# Patient Record
Sex: Female | Born: 1950
Health system: Southern US, Community
[De-identification: ages and names within clinical notes are randomized; demographics above are authoritative.]

## PROBLEM LIST (undated history)

## (undated) DIAGNOSIS — M8580 Other specified disorders of bone density and structure, unspecified site: Secondary | ICD-10-CM

## (undated) DIAGNOSIS — T7840XA Allergy, unspecified, initial encounter: Secondary | ICD-10-CM

## (undated) DIAGNOSIS — E785 Hyperlipidemia, unspecified: Secondary | ICD-10-CM

## (undated) DIAGNOSIS — J45909 Unspecified asthma, uncomplicated: Secondary | ICD-10-CM

## (undated) DIAGNOSIS — Z853 Personal history of malignant neoplasm of breast: Secondary | ICD-10-CM

## (undated) DIAGNOSIS — C801 Malignant (primary) neoplasm, unspecified: Secondary | ICD-10-CM

## (undated) DIAGNOSIS — I10 Essential (primary) hypertension: Secondary | ICD-10-CM

## (undated) DIAGNOSIS — K219 Gastro-esophageal reflux disease without esophagitis: Secondary | ICD-10-CM

## (undated) DIAGNOSIS — I2510 Atherosclerotic heart disease of native coronary artery without angina pectoris: Secondary | ICD-10-CM

## (undated) HISTORY — PX: REDUCTION MAMMAPLASTY: SUR839

## (undated) HISTORY — DX: Hyperlipidemia, unspecified: E78.5

## (undated) HISTORY — DX: Malignant (primary) neoplasm, unspecified: C80.1

## (undated) HISTORY — DX: Other specified disorders of bone density and structure, unspecified site: M85.80

## (undated) HISTORY — PX: BREAST IMPLANT EXCHANGE: SHX6296

## (undated) HISTORY — PX: COLONOSCOPY: SHX174

## (undated) HISTORY — DX: Allergy, unspecified, initial encounter: T78.40XA

## (undated) HISTORY — PX: MASTECTOMY: SHX3

## (undated) HISTORY — DX: Essential (primary) hypertension: I10

## (undated) HISTORY — DX: Atherosclerotic heart disease of native coronary artery without angina pectoris: I25.10

## (undated) HISTORY — DX: Gastro-esophageal reflux disease without esophagitis: K21.9

## (undated) HISTORY — PX: AUGMENTATION MAMMAPLASTY: SUR837

## (undated) HISTORY — DX: Personal history of malignant neoplasm of breast: Z85.3

## (undated) HISTORY — DX: Unspecified asthma, uncomplicated: J45.909

## (undated) HISTORY — PX: COSMETIC SURGERY: SHX468

---
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
## 2010-12-11 DIAGNOSIS — Z803 Family history of malignant neoplasm of breast: Secondary | ICD-10-CM | POA: Insufficient documentation

## 2010-12-11 DIAGNOSIS — N63 Unspecified lump in unspecified breast: Secondary | ICD-10-CM | POA: Insufficient documentation

## 2010-12-15 DIAGNOSIS — C50919 Malignant neoplasm of unspecified site of unspecified female breast: Secondary | ICD-10-CM | POA: Insufficient documentation

## 2012-05-03 LAB — HM COLONOSCOPY

## 2013-06-22 HISTORY — PX: ABDOMINAL HYSTERECTOMY: SHX81

## 2013-10-20 DIAGNOSIS — R9439 Abnormal result of other cardiovascular function study: Secondary | ICD-10-CM | POA: Insufficient documentation

## 2015-01-17 DIAGNOSIS — Z9889 Other specified postprocedural states: Secondary | ICD-10-CM | POA: Insufficient documentation

## 2016-09-24 DIAGNOSIS — R7981 Abnormal blood-gas level: Secondary | ICD-10-CM | POA: Insufficient documentation

## 2016-09-24 DIAGNOSIS — R3989 Other symptoms and signs involving the genitourinary system: Secondary | ICD-10-CM | POA: Insufficient documentation

## 2016-09-24 DIAGNOSIS — M25559 Pain in unspecified hip: Secondary | ICD-10-CM | POA: Insufficient documentation

## 2016-09-24 DIAGNOSIS — M898X9 Other specified disorders of bone, unspecified site: Secondary | ICD-10-CM | POA: Insufficient documentation

## 2016-09-24 DIAGNOSIS — G8929 Other chronic pain: Secondary | ICD-10-CM | POA: Insufficient documentation

## 2016-09-25 DIAGNOSIS — J3089 Other allergic rhinitis: Secondary | ICD-10-CM | POA: Insufficient documentation

## 2016-12-28 DIAGNOSIS — M659 Synovitis and tenosynovitis, unspecified: Secondary | ICD-10-CM | POA: Insufficient documentation

## 2016-12-28 DIAGNOSIS — M65962 Unspecified synovitis and tenosynovitis, left lower leg: Secondary | ICD-10-CM | POA: Insufficient documentation

## 2016-12-28 DIAGNOSIS — M2242 Chondromalacia patellae, left knee: Secondary | ICD-10-CM | POA: Insufficient documentation

## 2017-02-17 DIAGNOSIS — R748 Abnormal levels of other serum enzymes: Secondary | ICD-10-CM | POA: Insufficient documentation

## 2017-02-17 DIAGNOSIS — Z23 Encounter for immunization: Secondary | ICD-10-CM | POA: Insufficient documentation

## 2017-02-18 DIAGNOSIS — G629 Polyneuropathy, unspecified: Secondary | ICD-10-CM | POA: Insufficient documentation

## 2017-02-18 DIAGNOSIS — A6004 Herpesviral vulvovaginitis: Secondary | ICD-10-CM | POA: Insufficient documentation

## 2017-02-23 DIAGNOSIS — Z800 Family history of malignant neoplasm of digestive organs: Secondary | ICD-10-CM | POA: Insufficient documentation

## 2017-03-03 DIAGNOSIS — M79662 Pain in left lower leg: Secondary | ICD-10-CM | POA: Insufficient documentation

## 2017-03-25 ENCOUNTER — Encounter: Payer: Self-pay | Admitting: Neurology

## 2017-06-28 ENCOUNTER — Ambulatory Visit: Payer: Self-pay | Admitting: Neurology

## 2018-01-10 ENCOUNTER — Ambulatory Visit (INDEPENDENT_AMBULATORY_CARE_PROVIDER_SITE_OTHER): Payer: Managed Care, Other (non HMO) | Admitting: Family Medicine

## 2018-01-10 ENCOUNTER — Encounter: Payer: Self-pay | Admitting: Family Medicine

## 2018-01-10 VITALS — BP 110/64 | HR 87 | Temp 98.5°F | Ht 67.0 in | Wt 231.4 lb

## 2018-01-10 DIAGNOSIS — Z1231 Encounter for screening mammogram for malignant neoplasm of breast: Secondary | ICD-10-CM | POA: Diagnosis not present

## 2018-01-10 DIAGNOSIS — Z1239 Encounter for other screening for malignant neoplasm of breast: Secondary | ICD-10-CM

## 2018-01-10 DIAGNOSIS — Z1283 Encounter for screening for malignant neoplasm of skin: Secondary | ICD-10-CM

## 2018-01-10 DIAGNOSIS — K219 Gastro-esophageal reflux disease without esophagitis: Secondary | ICD-10-CM

## 2018-01-10 DIAGNOSIS — I10 Essential (primary) hypertension: Secondary | ICD-10-CM | POA: Diagnosis not present

## 2018-01-10 DIAGNOSIS — J4520 Mild intermittent asthma, uncomplicated: Secondary | ICD-10-CM | POA: Insufficient documentation

## 2018-01-10 MED ORDER — MONTELUKAST SODIUM 10 MG PO TABS: 10.0000 mg | ORAL_TABLET | Freq: Every day | ORAL | 3 refills | Status: DC

## 2018-01-10 MED ORDER — ESOMEPRAZOLE MAGNESIUM 20 MG PO PACK: 20.0000 mg | PACK | Freq: Every day | ORAL | 1 refills | Status: DC

## 2018-01-10 MED ORDER — METOPROLOL SUCCINATE ER 25 MG PO TB24: 25.0000 mg | ORAL_TABLET | Freq: Every day | ORAL | 3 refills | Status: DC

## 2018-01-10 MED ORDER — LOSARTAN POTASSIUM 50 MG PO TABS: 50.0000 mg | ORAL_TABLET | Freq: Every day | ORAL | 3 refills | Status: DC

## 2018-01-10 NOTE — Progress Notes (Signed)
Pre visit review using our clinic review tool, if applicable. No additional management support is needed unless otherwise documented below in the visit note. 

## 2018-01-10 NOTE — Progress Notes (Signed)
Chief Complaint  Patient presents with  . New Patient (Initial Visit)       New Patient Visit SUBJECTIVE: HPI: Tonya [name] is an 67 y.o.female who is being seen for establishing care.  The patient was previously seen at [facility].    She has a history of reflux.  She has tried to come down from proton pump inhibitors, however her symptoms came back with a vengeance on H2 blockers.  She is currently taking Nexium 20 mg every other day.  This seems to be controlling her symptoms.  She has identified which foods appear to cause her flares.  Increased weight also appears to make her symptoms worse.  She has a history of high blood pressure.  She is compliant with her medication.  She tries to stay active.  Diet is overall healthy.  She has a history of asthma.  She rarely uses her albuterol inhaler.  Singulair helps with both allergy and breathing issues.  She is tolerating this well.  No Known Allergies  Past Medical History:  Diagnosis Date  . Hypertension    Past Surgical History:  Procedure Laterality Date  . ABDOMINAL HYSTERECTOMY  2015  . MASTECTOMY Left   . TONSILLECTOMY AND ADENOIDECTOMY  1958   Family History  Problem Relation Age of Onset  . Heart attack Father        died from   No Known Allergies  Current Outpatient Medications:  .  losartan (COZAAR) 50 MG tablet, Take 1 tablet (50 mg total) by mouth daily., Disp: 90 tablet, Rfl: 3 .  metoprolol succinate (TOPROL-XL) 25 MG 24 hr tablet, Take 1 tablet (25 mg total) by mouth daily., Disp: 90 tablet, Rfl: 3 .  montelukast (SINGULAIR) 10 MG tablet, Take 1 tablet (10 mg total) by mouth at bedtime., Disp: 90 tablet, Rfl: 3 .  esomeprazole (NEXIUM) 20 MG packet, Take 20 mg by mouth daily before breakfast., Disp: 90 each, Rfl: 1  ROS Cardiovascular: Denies chest pain  Respiratory: Denies dyspnea   OBJECTIVE: BP 110/64 (BP Location: Left Arm, Patient Position: Sitting, Cuff Size: Large)   Pulse 87   Temp 98.5 F  (36.9 C) (Oral)   Ht 5\' 7"  (1.702 m)   Wt 231 lb 6 oz (105 kg)   SpO2 97%   BMI 36.24 kg/m   Constitutional: -  VS reviewed -  Well developed, well nourished, appears stated age -  No apparent distress  Psychiatric: -  Oriented to person, place, and time -  Memory intact -  Affect and mood normal -  Fluent conversation, good eye contact -  Judgment and insight age appropriate  Eye: -  Conjunctivae clear, no discharge -  Pupils symmetric, round, reactive to light  ENMT: -  MMM    Pharynx moist, no exudate, no erythema  Neck: -  No gross swelling, no palpable masses -  Thyroid midline, not enlarged, mobile, no palpable masses  Cardiovascular: -  RRR -  No LE edema  Respiratory: -  Normal respiratory effort, no accessory muscle use, no retraction -  Breath sounds equal, no wheezes, no ronchi, no crackles  Gastrointestinal: -  Bowel sounds normal -  No tenderness, no distention, no guarding, no masses  Musculoskeletal: -  No clubbing, no cyanosis -  Gait normal  Skin: -Circular patch of mild hyperpigmentation on the medial distal left lower extremity.  It is tender to palpation.  There is an area slightly lower with superficial telangiectasias noted.  It is  also tender to palpation. -  Warm and dry to palpation   ASSESSMENT/PLAN: Gastroesophageal reflux disease, esophagitis presence not specified - Plan: esomeprazole ([place]) 20 MG packet  Screening for breast cancer - Plan: MM DIGITAL SCREENING BILATERAL  Essential hypertension - Plan: losartan (COZAAR) 50 MG tablet, metoprolol succinate (TOPROL-XL) 25 MG 24 hr tablet  Skin exam, screening for cancer  Mild intermittent asthma without complication - Plan: montelukast (SINGULAIR) 10 MG tablet, Ambulatory referral to Dermatology  Patient instructed to sign release of records form from her previous PCP. I am okay with her taking Nexium every other day. Continue blood pressure regimen. Continue Singulair. Patient should return  for a physical in 3 months. The patient voiced understanding and agreement to the plan.   [place], DO 01/10/18  12:37 PM

## 2018-01-10 NOTE — Patient Instructions (Addendum)
If you do not hear anything about your referral in the next 1-2 weeks, call our office and ask for an update.  Tylenol is the safest medicine to help with pains. Yoga may be helpful moving forward. Consider YouTube.   Nexium and Protonix are very similar. I think it is a reasonable substitution if Nexium is too expensive.  Let us know if you need anything.

## 2018-02-11 ENCOUNTER — Other Ambulatory Visit: Payer: Self-pay | Admitting: Family Medicine

## 2018-02-11 ENCOUNTER — Encounter (HOSPITAL_BASED_OUTPATIENT_CLINIC_OR_DEPARTMENT_OTHER): Payer: Self-pay | Admitting: Radiology

## 2018-02-11 ENCOUNTER — Ambulatory Visit (HOSPITAL_BASED_OUTPATIENT_CLINIC_OR_DEPARTMENT_OTHER)
Admission: RE | Admit: 2018-02-11 | Discharge: 2018-02-11 | Disposition: A | Discharge status: Home / Self Care | Payer: Managed Care, Other (non HMO) | Source: Ambulatory Visit | Attending: Family Medicine | Admitting: Family Medicine

## 2018-02-11 DIAGNOSIS — Z1231 Encounter for screening mammogram for malignant neoplasm of breast: Secondary | ICD-10-CM | POA: Insufficient documentation

## 2018-02-11 DIAGNOSIS — Z1239 Encounter for other screening for malignant neoplasm of breast: Secondary | ICD-10-CM

## 2018-02-11 IMAGING — MG DIGITAL SCREENING UNILATERAL RIGHT MAMMOGRAM WITH CAD AND TOMO
4 series · 4 of 12 positions shown · non-contrast
Comparison: Previous exam(s).

CLINICAL DATA: Screening.

EXAM:
DIGITAL SCREENING UNILATERAL RIGHT MAMMOGRAM WITH CAD AND TOMO

[R MLO synth-2D]
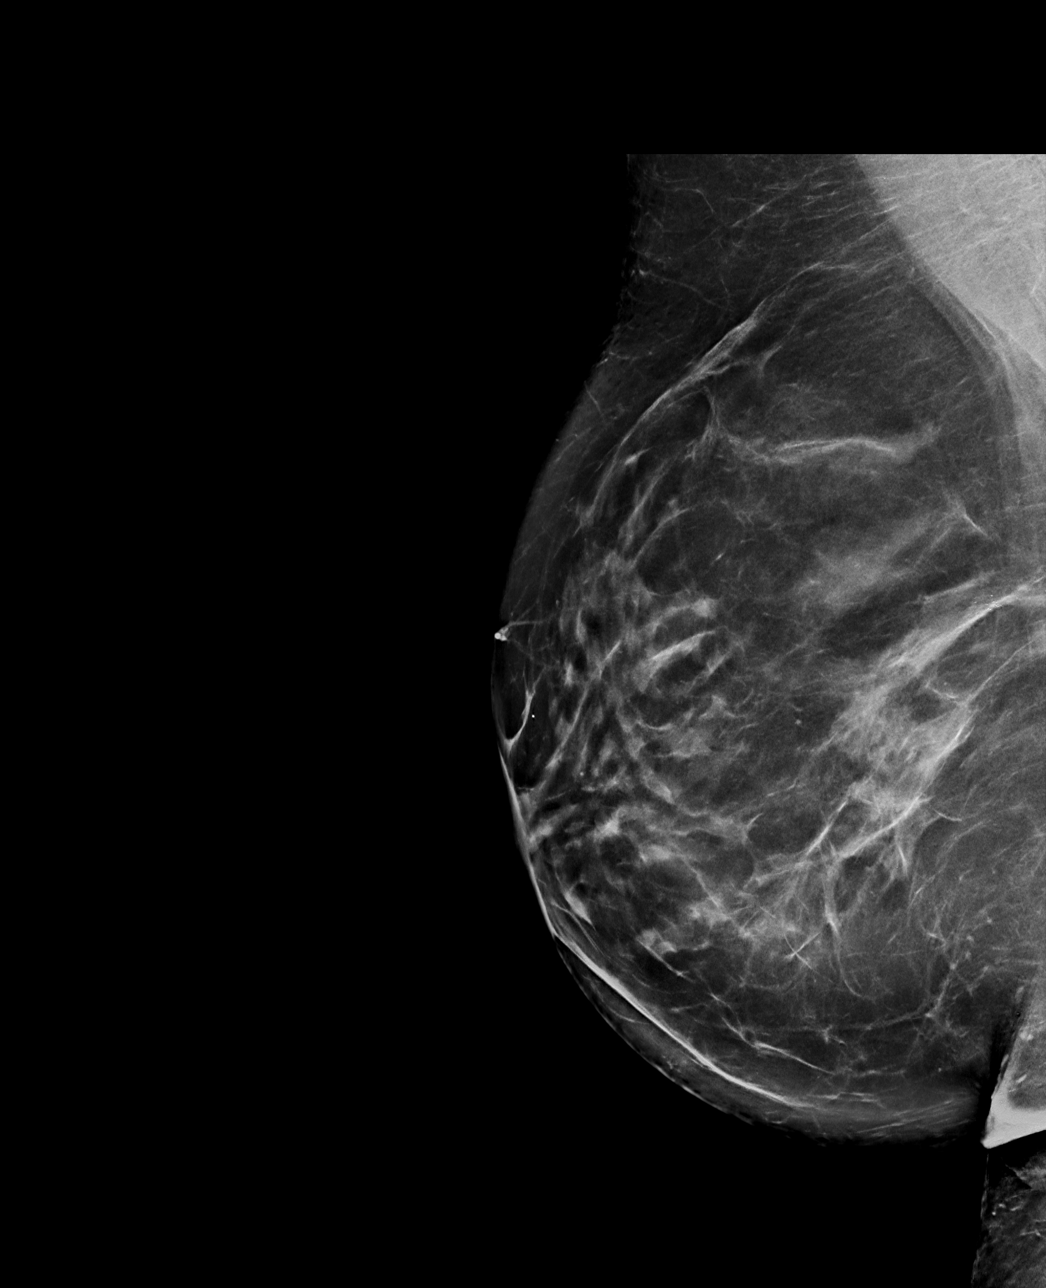

[R CC synth-2D]
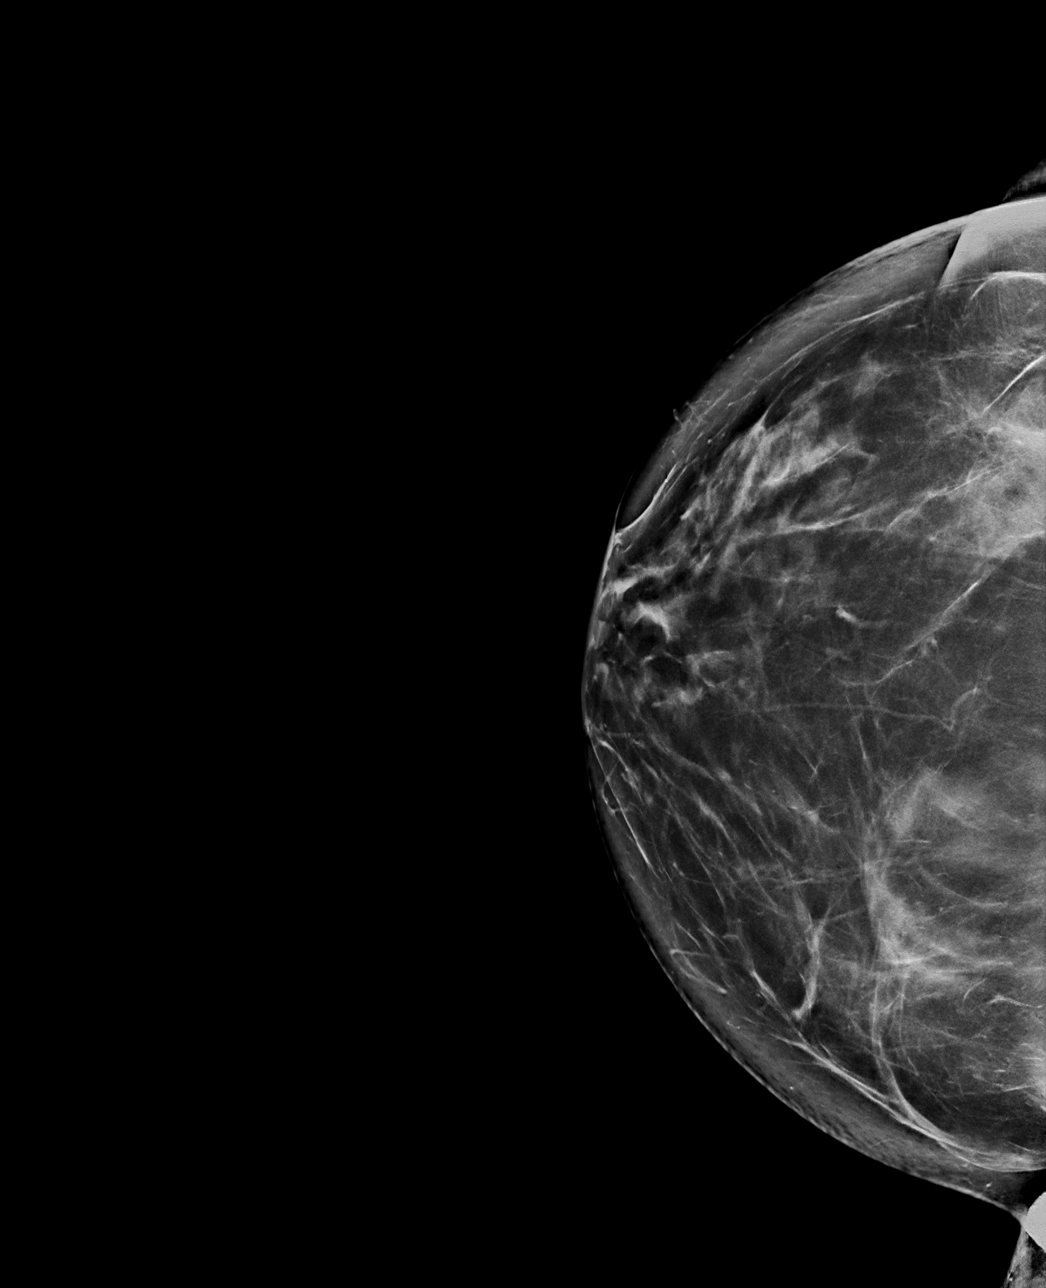

[R MLO tomo · tomo slice 51/101.0]
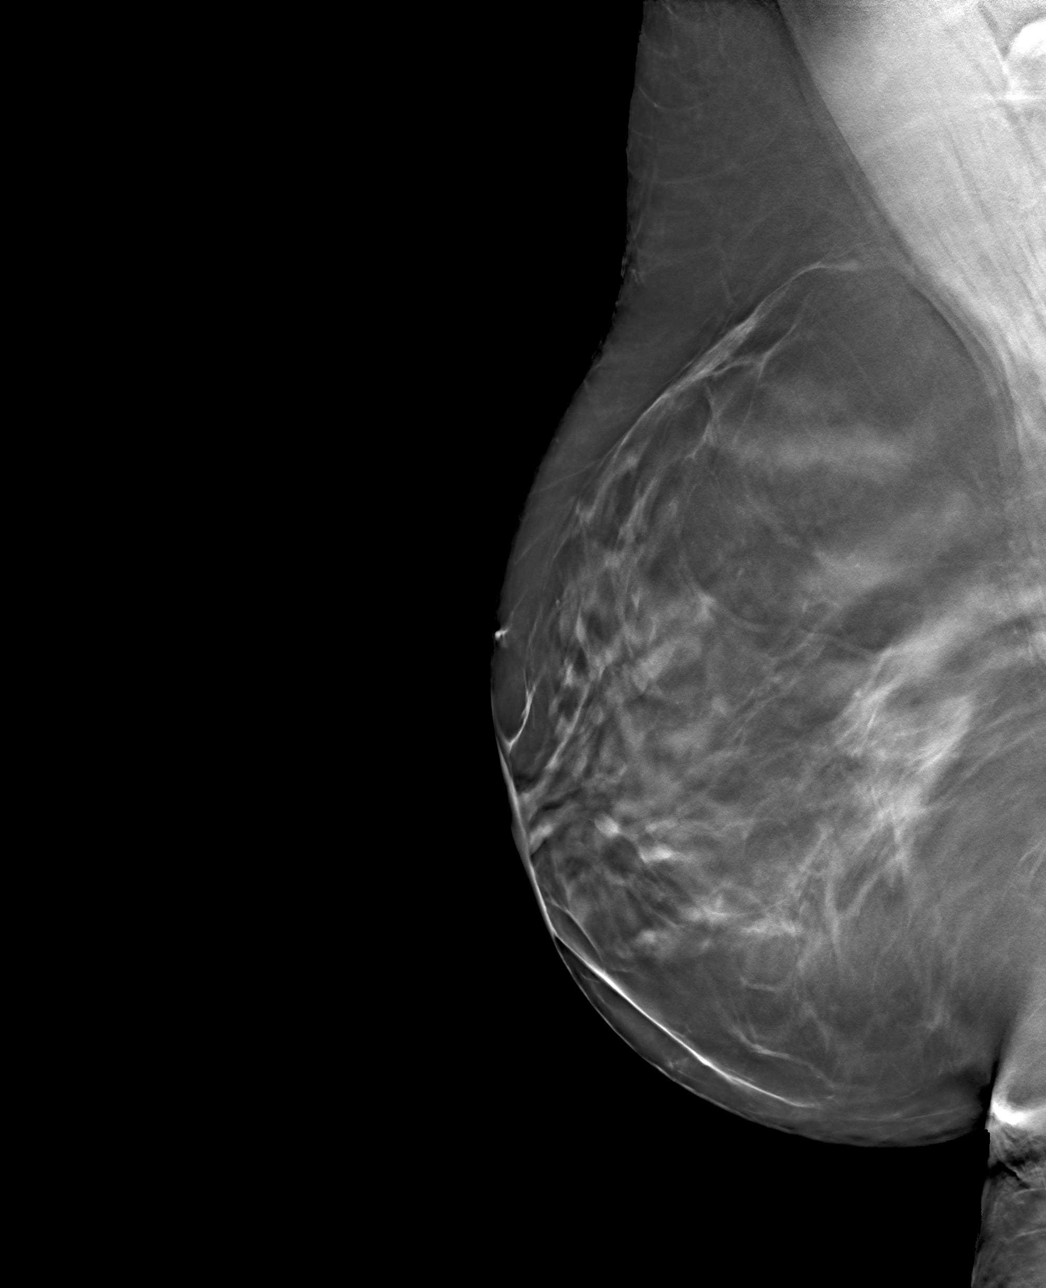

[R CC tomo · tomo slice 49/98.0]
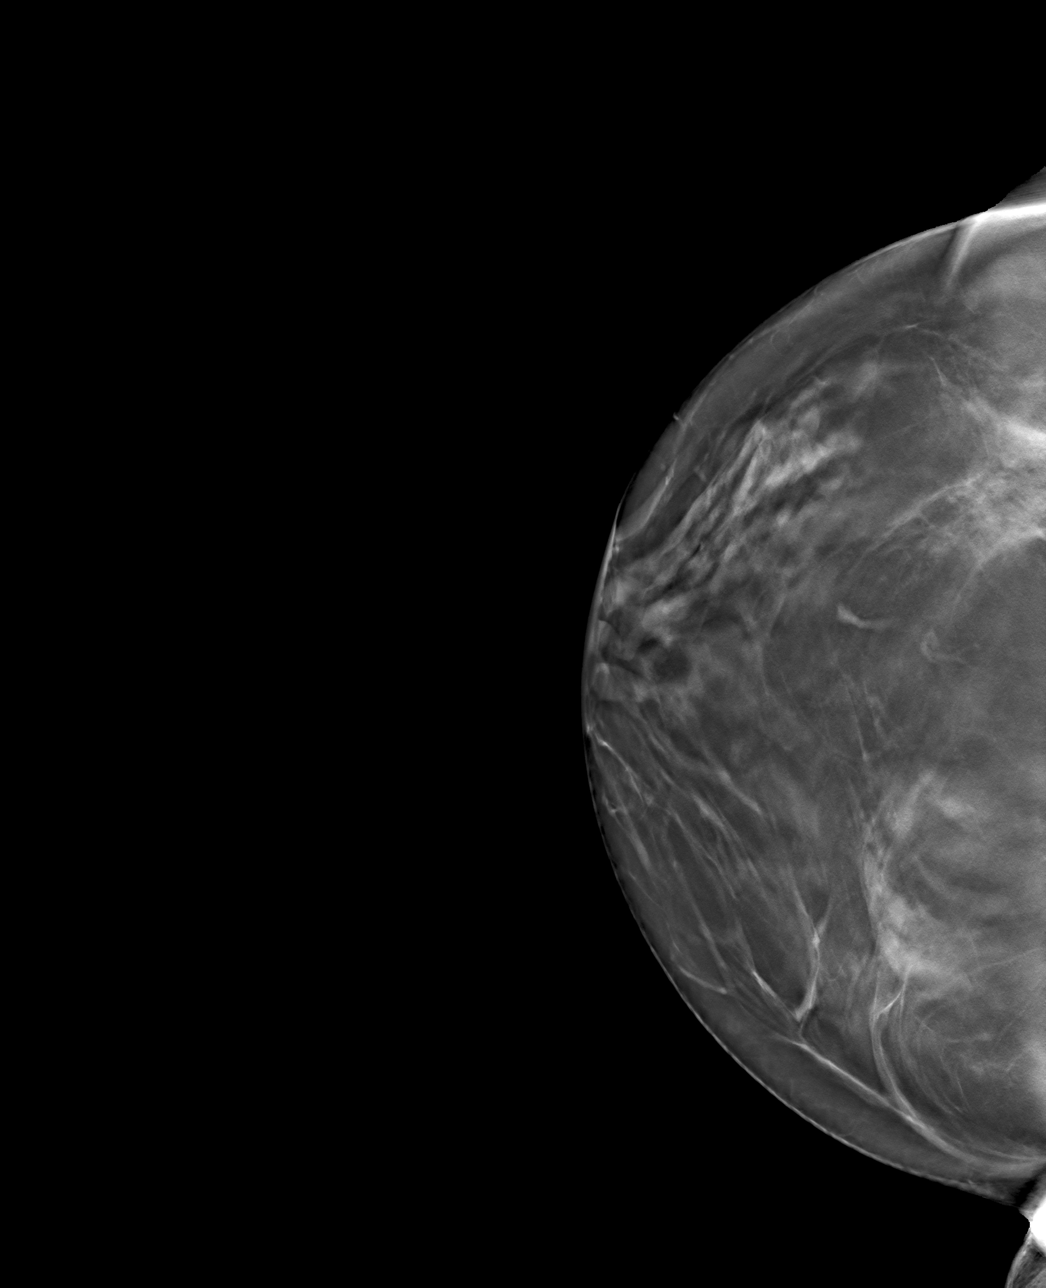

[4 of 12 positions shown; findings below may reference images not displayed]

ACR Breast Density Category b: There are scattered areas of
fibroglandular density.
FINDINGS: There are no findings suspicious for malignancy. Images were
processed with CAD.
IMPRESSION: No mammographic evidence of malignancy. A result letter of this
screening mammogram will be mailed directly to the patient.

RECOMMENDATION:
Screening mammogram in one year. (Code:[YD])

BI-RADS CATEGORY  1: Negative.

## 2018-03-22 ENCOUNTER — Telehealth: Payer: Self-pay | Admitting: *Deleted

## 2018-03-22 NOTE — Telephone Encounter (Signed)
Received Medical records from [facility]; forwarded to provider/SLS 10/01

## 2018-04-06 ENCOUNTER — Telehealth: Payer: Self-pay | Admitting: Family Medicine

## 2018-04-06 DIAGNOSIS — K219 Gastro-esophageal reflux disease without esophagitis: Secondary | ICD-10-CM

## 2018-04-06 MED ORDER — PANTOPRAZOLE SODIUM 20 MG PO TBEC: 20.0000 mg | DELAYED_RELEASE_TABLET | Freq: Every day | ORAL | 2 refills | Status: DC

## 2018-04-06 NOTE — Telephone Encounter (Signed)
Copied from [place] [id number]. Topic: Quick Communication - See Telephone Encounter >> Apr 06, 2018  1:03 PM Hewitt Shorts wrote: Pt is calling to see if Dr. Nani Ravens will refill her pantotrazole   20 mg 90 day supply-it has not been filled by him before but they have talked it over that it works just the same as nexium an is a lot less expensive   [facility] mail order pharmacy -not on file either   Best number [phone]

## 2018-04-06 NOTE — Addendum Note (Signed)
Addended by: Raynelle Dick R on: 04/06/2018 02:50 PM   Modules accepted: Orders

## 2018-04-06 NOTE — Telephone Encounter (Signed)
Done. TY.

## 2018-04-06 NOTE — Telephone Encounter (Signed)
This medication was sent to the wrong pharmacy. Can this please be sent to [facility], [address] [place] [address]  Phone: [phone] Fax: [phone]

## 2018-04-06 NOTE — Telephone Encounter (Signed)
done

## 2018-04-06 NOTE — Telephone Encounter (Signed)
Pt called requesting to take pantoprazole in place of Nexium. She is asking for a refill. This med is not listed on her med list. So she is asking for a prescription for a 20 mg tab and 90 day supply. She states that it is less expensive than the Nexium..  [place]

## 2018-04-14 ENCOUNTER — Ambulatory Visit (INDEPENDENT_AMBULATORY_CARE_PROVIDER_SITE_OTHER): Payer: Managed Care, Other (non HMO) | Admitting: Family Medicine

## 2018-04-14 ENCOUNTER — Encounter: Payer: Self-pay | Admitting: Family Medicine

## 2018-04-14 VITALS — BP 120/84 | HR 89 | Temp 98.1°F | Ht 67.0 in | Wt 228.1 lb

## 2018-04-14 DIAGNOSIS — E2839 Other primary ovarian failure: Secondary | ICD-10-CM

## 2018-04-14 DIAGNOSIS — Z0000 Encounter for general adult medical examination without abnormal findings: Secondary | ICD-10-CM

## 2018-04-14 DIAGNOSIS — Z23 Encounter for immunization: Secondary | ICD-10-CM | POA: Diagnosis not present

## 2018-04-14 LAB — COMPREHENSIVE METABOLIC PANEL
ALK PHOS: 80 U/L (ref 39–117)
ALT: 23 U/L (ref 0–35)
AST: 23 U/L (ref 0–37)
Albumin: 4.4 g/dL (ref 3.5–5.2)
BILIRUBIN TOTAL: 0.8 mg/dL (ref 0.2–1.2)
BUN: 15 mg/dL (ref 6–23)
CALCIUM: 9.5 mg/dL (ref 8.4–10.5)
CO2: 27 mEq/L (ref 19–32)
CREATININE: 0.84 mg/dL (ref 0.40–1.20)
Chloride: 104 mEq/L (ref 96–112)
GFR: 72.04 mL/min (ref >60.00)
Glucose, Bld: 93 mg/dL (ref 70–99)
Potassium: 4.3 mEq/L (ref 3.5–5.1)
Sodium: 141 mEq/L (ref 135–145)
TOTAL PROTEIN: 7.3 g/dL (ref 6.0–8.3)

## 2018-04-14 LAB — LIPID PANEL
Cholesterol: 220 mg/dL — ABNORMAL HIGH (ref 0–200)
HDL: 60.4 mg/dL (ref >39.00)
LDL Cholesterol: 139 mg/dL — ABNORMAL HIGH (ref 0–99)
NonHDL: 159.91
TRIGLYCERIDES: 105 mg/dL (ref 0.0–149.0)
Total CHOL/HDL Ratio: 4
VLDL: 21 mg/dL (ref 0.0–40.0)

## 2018-04-14 NOTE — Patient Instructions (Addendum)
The new Shingrix vaccine (for shingles) is a 2 shot series. It can make people feel low energy, achy and almost like they have the flu for 48 hours after injection. Please plan accordingly when deciding on when to get this shot. Call our office for a nurse visit appointment to get this. The second shot of the series is less severe regarding the side effects, but it still lasts 48 hours.   Keep the diet clean.  Aim to do some physical exertion for 150 minutes per week. This is typically divided into 5 days per week, 30 minutes per day. The activity should be enough to get your heart rate up. Anything is better than nothing if you have time constraints.  Give [place] 2-3 business days to get the results of your labs back.   Sleep is important to [place] all. Getting good sleep is imperative to adequate functioning during the day. Work with our counselors who are trained to help people obtain quality sleep. Call [phone] to schedule an appointment or if you are curious about insurance coverage/cost.  Let us know if you need anything.

## 2018-04-14 NOTE — Progress Notes (Signed)
Pre visit review using our clinic review tool, if applicable. No additional management support is needed unless otherwise documented below in the visit note. 

## 2018-04-14 NOTE — Progress Notes (Signed)
Chief Complaint  Patient presents with  . Annual Exam     Well Woman Tonya [name] is here for a complete physical.   Her last physical was >1 year ago.  Current diet: in general, a "not the greatest" diet. Current exercise: None. Weight is stable and shedenies daytime fatigue. No LMP recorded. Patient is postmenopausal.  Seatbelt? Yes  Health Maintenance Colonoscopy- Yes- when she was 17 Shingrix- No DEXA- No Mammogram- Yes  Tetanus- Yes 10/20/2008 Pneumonia- No Hep C screen- Yes- 2018  Past Medical History:  Diagnosis Date  . Hypertension      Past Surgical History:  Procedure Laterality Date  . ABDOMINAL HYSTERECTOMY  2015  . MASTECTOMY Left   . TONSILLECTOMY AND ADENOIDECTOMY  1958    Medications  Current Outpatient Medications on File Prior to Visit  Medication Sig Dispense Refill  . losartan (COZAAR) 50 MG tablet Take 1 tablet (50 mg total) by mouth daily. 90 tablet 3  . metoprolol succinate (TOPROL-XL) 25 MG 24 hr tablet Take 1 tablet (25 mg total) by mouth daily. 90 tablet 3  . montelukast (SINGULAIR) 10 MG tablet Take 1 tablet (10 mg total) by mouth at bedtime. 90 tablet 3  . pantoprazole (PROTONIX) 20 MG tablet Take 1 tablet (20 mg total) by mouth daily. 90 tablet 2   Allergies No Known Allergies  Review of Systems: Constitutional:  no sweats Eye:  no recent significant change in vision Ear/Nose/Mouth/Throat:  Ears:  No changes in hearing Nose/Mouth/Throat:  no complaints of nasal congestion, no sore throat Cardiovascular: no chest pain Respiratory:  No cough and no shortness of breath Gastrointestinal:  no abdominal pain, no change in bowel habits GU:  Female: negative for dysuria or pelvic pain Musculoskeletal/Extremities:  no pain of the joints Integumentary (Skin/Breast):  no abnormal skin lesions reported Neurologic:  no headaches Psychiatric:  no anxiety, no depression Endocrine:  denies unexplained weight changes Hematologic/Lymphatic:  no  abnormal bleeding  Exam BP 120/84 (BP Location: Left Arm, Patient Position: Sitting, Cuff Size: Normal)   Pulse 89   Temp 98.1 F (36.7 C) (Oral)   Ht 5\' 7"  (1.702 m)   Wt 228 lb 2 oz (103.5 kg)   SpO2 95%   BMI 35.73 kg/m  General:  well developed, well nourished, in no apparent distress Skin:  no significant moles, warts, or growths Head:  no masses, lesions, or tenderness Eyes:  pupils equal and round, sclera anicteric without injection Ears:  canals without lesions, TMs shiny without retraction, no obvious effusion, no erythema Nose:  nares patent, septum midline, mucosa normal, and no drainage or sinus tenderness Throat/Pharynx:  lips and gingiva without lesion; tongue and uvula midline; non-inflamed pharynx; no exudates or postnasal drainage Neck: neck supple without adenopathy, thyromegaly, or masses Lungs:  clear to auscultation, breath sounds equal bilaterally, no respiratory distress Cardio:  regular rate and rhythm, no bruits or LE edema Abdomen:  abdomen soft, nontender; bowel sounds normal; no masses or organomegaly Genital: Deferred Musculoskeletal:  symmetrical muscle groups noted without atrophy or deformity Extremities:  no clubbing, cyanosis, or edema, no deformities, no skin discoloration Neuro:  gait normal; deep tendon reflexes normal and symmetric Psych: well oriented with normal range of affect and appropriate judgment/insight  Assessment and Plan  Well adult exam - Plan: Comprehensive metabolic panel, Lipid panel  Estrogen deficiency - Plan: DG Bone Density  Influenza vaccine administered - Plan: Flu Vaccine QUAD 6+ mos PF IM (Fluarix Quad PF)  Need for vaccination against  Streptococcus pneumoniae - Plan: Pneumococcal conjugate vaccine 13-valent   Well 67 y.o. female. Counseled on diet and exercise. Shingrix info given. Other orders as above. Follow up in 1 year pending the above workup. PCV23 due that date. The patient voiced understanding and  agreement to the plan.  [place], DO 04/14/18 9:37 AM

## 2018-04-22 ENCOUNTER — Encounter: Payer: Self-pay | Admitting: Family Medicine

## 2018-04-22 ENCOUNTER — Ambulatory Visit (HOSPITAL_BASED_OUTPATIENT_CLINIC_OR_DEPARTMENT_OTHER)
Admission: RE | Admit: 2018-04-22 | Discharge: 2018-04-22 | Disposition: A | Discharge status: Home / Self Care | Payer: Managed Care, Other (non HMO) | Source: Ambulatory Visit | Attending: Family Medicine | Admitting: Family Medicine

## 2018-04-22 DIAGNOSIS — E2839 Other primary ovarian failure: Secondary | ICD-10-CM | POA: Diagnosis not present

## 2018-04-22 DIAGNOSIS — M85852 Other specified disorders of bone density and structure, left thigh: Secondary | ICD-10-CM | POA: Insufficient documentation

## 2018-04-22 DIAGNOSIS — Z13820 Encounter for screening for osteoporosis: Secondary | ICD-10-CM | POA: Diagnosis present

## 2018-04-22 DIAGNOSIS — M8580 Other specified disorders of bone density and structure, unspecified site: Secondary | ICD-10-CM | POA: Insufficient documentation

## 2018-07-05 ENCOUNTER — Ambulatory Visit: Payer: Self-pay | Admitting: *Deleted

## 2018-07-05 NOTE — Telephone Encounter (Signed)
Pt returned call for cough and congestion. She stated that she was exposed to RSV (her grand daughter). She has a stuffy nose, a "barky" cough, sore throat, achy body, headache and low grade fever.  She was exposed to her on Friday. She is able to take Tylenol and alternate with Advil for the pain. She has a hx of asthma and uses an inhaler.  Advised to try hot liquids like tea with honey and lemon, using an humidifier and cont with the medications for her body aches. Appointment scheduled per protocol. She will call if she starts feeling better in the morning. She will go to an urgent care of ED if she has shortness of breath or fever. Routing to [facility] at [facility].  Reason for Disposition . [1] Continuous (nonstop) coughing interferes with work or school AND [2] no improvement using cough treatment per Care Advice  Answer Assessment - Initial Assessment Questions 1. ONSET: "When did the cough begin?"      Friday 2. SEVERITY: "How bad is the cough today?"      productive 3. RESPIRATORY DISTRESS: "Describe your breathing."      Stuffiness, barky cough 4. FEVER: "Do you have a fever?" If so, ask: "What is your temperature, how was it measured, and when did it start?"     Low grade 5. SPUTUM: "Describe the color of your sputum" (clear, white, yellow, green)     green 6. HEMOPTYSIS: "Are you coughing up any blood?" If so ask: "How much?" (flecks, streaks, tablespoons, etc.)     no 7. CARDIAC HISTORY: "Do you have any history of heart disease?" (e.g., heart attack, congestive heart failure)     HTN 8. LUNG HISTORY: "Do you have any history of lung disease?"  (e.g., pulmonary embolus, asthma, emphysema)     asthma 9. PE RISK FACTORS: "Do you have a history of blood clots?" (or: recent major surgery, recent prolonged travel, bedridden)     no 10. OTHER SYMPTOMS: "Do you have any other symptoms?" (e.g., runny nose, wheezing, chest pain)      Stuffy nose, headache, body aches  11.  PREGNANCY: "Is there any chance you are pregnant?" "When was your last menstrual period?"       n/a 12. TRAVEL: "Have you traveled out of the country in the last month?" (e.g., travel history, exposures)  Protocols used: [place]

## 2018-07-05 NOTE — Telephone Encounter (Signed)
Message from Berneta Levins sent at 07/05/2018 4:51 PM EST   Summary: exposure to RSV   Pt was exposed to RSV. Pt would like a nurse to call her and let her know what the symptoms of RSV are in an adult. Pt has been stopped up, pain in upper back, congestion, cough, sore throat, green mucus

## 2018-07-06 ENCOUNTER — Ambulatory Visit (INDEPENDENT_AMBULATORY_CARE_PROVIDER_SITE_OTHER): Payer: Managed Care, Other (non HMO) | Admitting: Family

## 2018-07-06 ENCOUNTER — Encounter: Payer: Self-pay | Admitting: Family

## 2018-07-06 VITALS — BP 120/60 | HR 101 | Temp 98.7°F | Resp 16 | Ht 67.0 in | Wt 232.0 lb

## 2018-07-06 DIAGNOSIS — J029 Acute pharyngitis, unspecified: Secondary | ICD-10-CM

## 2018-07-06 DIAGNOSIS — R0981 Nasal congestion: Secondary | ICD-10-CM

## 2018-07-06 DIAGNOSIS — H6692 Otitis media, unspecified, left ear: Secondary | ICD-10-CM | POA: Diagnosis not present

## 2018-07-06 LAB — POCT RAPID STREP A (OFFICE): Rapid Strep A Screen: NEGATIVE

## 2018-07-06 LAB — POC INFLUENZA A&B (BINAX/QUICKVUE)
Influenza A, POC: NEGATIVE
Influenza B, POC: NEGATIVE

## 2018-07-06 MED ORDER — AMOXICILLIN 500 MG PO CAPS: 500.0000 mg | ORAL_CAPSULE | Freq: Three times a day (TID) | ORAL | 0 refills | Status: DC

## 2018-07-06 NOTE — Progress Notes (Signed)
Subjective:    Patient ID: Tonya [name], female    DOB: May 24, 1951, 68 y.o.   MRN: [id number]  HPI  Patient is a 68 yr old female who presents today with c/o nasal congestion/HA, Cough. Symptoms began on 07/01/18.  Had sore throat last week which has now resolved.   Reports some post nasal drip. Stopped running with the sore throat got really bad.  Started som breo that she had on hand.    Review of Systems See HPI  Past Medical History:  Diagnosis Date  . Hypertension   . Osteopenia      Social History   Socioeconomic History  . Marital status: Married    Spouse name: Not on file  . Number of children: Not on file  . Years of education: Not on file  . Highest education level: Not on file  Occupational History  . Not on file  Social Needs  . Financial resource strain: Not on file  . Food insecurity:    Worry: Not on file    Inability: Not on file  . Transportation needs:    Medical: Not on file    Non-medical: Not on file  Tobacco Use  . Smoking status: Never Smoker  . Smokeless tobacco: Never Used  Substance and Sexual Activity  . Alcohol use: Not on file    Comment: rarely  . Drug use: Never  . Sexual activity: Not on file  Lifestyle  . Physical activity:    Days per week: Not on file    Minutes per session: Not on file  . Stress: Not on file  Relationships  . Social connections:    Talks on phone: Not on file    Gets together: Not on file    Attends religious service: Not on file    Active member of club or organization: Not on file    Attends meetings of clubs or organizations: Not on file    Relationship status: Not on file  . Intimate partner violence:    Fear of current or ex partner: Not on file    Emotionally abused: Not on file    Physically abused: Not on file    Forced sexual activity: Not on file  Other Topics Concern  . Not on file  Social History Narrative  . Not on file    Past Surgical History:  Procedure Laterality Date  .  ABDOMINAL HYSTERECTOMY  2015  . MASTECTOMY Left   . TONSILLECTOMY AND ADENOIDECTOMY  1958    Family History  Problem Relation Age of Onset  . Heart attack Father        died from    No Known Allergies  Current Outpatient Medications on File Prior to Visit  Medication Sig Dispense Refill  . losartan (COZAAR) 50 MG tablet Take 1 tablet (50 mg total) by mouth daily. 90 tablet 3  . metoprolol succinate (TOPROL-XL) 25 MG 24 hr tablet Take 1 tablet (25 mg total) by mouth daily. 90 tablet 3  . montelukast (SINGULAIR) 10 MG tablet Take 1 tablet (10 mg total) by mouth at bedtime. 90 tablet 3  . pantoprazole (PROTONIX) 20 MG tablet Take 1 tablet (20 mg total) by mouth daily. 90 tablet 2   No current facility-administered medications on file prior to visit.     BP 120/60 (BP Location: Right Arm, Patient Position: Sitting, Cuff Size: Large)   Pulse (!) 101   Temp 98.7 F (37.1 C) (Oral)   Resp 16  Ht 5\' 7"  (1.702 m)   Wt 232 lb (105.2 kg)   SpO2 98%   BMI 36.34 kg/m       Objective:   Physical Exam Constitutional:      Appearance: She is well-developed.  HENT:     Right Ear: Ear canal and external ear normal. Tympanic membrane is erythematous.     Left Ear: Ear canal and external ear normal. Tympanic membrane is not erythematous.     Mouth/Throat:     Mouth: Mucous membranes are moist.     Pharynx: No posterior oropharyngeal erythema.  Neck:     Musculoskeletal: Neck supple.     Thyroid: No thyromegaly.  Cardiovascular:     Rate and Rhythm: Normal rate and regular rhythm.     Heart sounds: Normal heart sounds. No murmur.  Pulmonary:     Effort: Pulmonary effort is normal. No respiratory distress.     Breath sounds: Normal breath sounds. No wheezing.  Skin:    General: Skin is warm and dry.  Neurological:     Mental Status: She is alert and oriented to person, place, and time.  Psychiatric:        Behavior: Behavior normal.        Thought Content: Thought content  normal.        Judgment: Judgment normal.           Assessment & Plan:  L Otitis media- rapid flu and rapid strep are negative.   Advised pt as follows: Begin amoxicillin for your left sided ear infection. You may use delsym as needed for cough and mucinex as needed for chest congestion. Call if you develop new/worsening symptoms or if not improved in 3-4 days.

## 2018-07-06 NOTE — Progress Notes (Deleted)
   Subjective:    Patient ID: Tonya [name], female    DOB: 03/12/1951, 68 y.o.   MRN: [id number]  HPI  Reports that she retired 2 weeks ago. Reports mild sore throat on 06/30/18.  07/01/18 ore throat worsened.  Wis miserable Friday night. Grand-daughter had RSV Thursday.  Did not sleep Friday night because she was at the hospital with her granddaughter.  Notes + HA.  Notes +post nasal drip.  + cough.    Review of Systems    see HPI  .basic  Objective:   Physical Exam        Assessment & Plan:

## 2018-07-06 NOTE — Patient Instructions (Signed)
Begin amoxicillin for your left sided ear infection. You may use delsym as needed for cough and mucinex as needed for chest congestion. Call if you develop new/worsening symptoms or if not improved in 3-4 days.

## 2018-07-07 ENCOUNTER — Ambulatory Visit: Payer: Self-pay | Admitting: *Deleted

## 2018-07-07 ENCOUNTER — Telehealth: Payer: Self-pay | Admitting: Family Medicine

## 2018-07-07 DIAGNOSIS — H9319 Tinnitus, unspecified ear: Secondary | ICD-10-CM

## 2018-07-07 NOTE — Telephone Encounter (Signed)
Pt was triaged Tuesday for cough and congestion, left ear pain. She saw a provider yesterday regarding her symptoms. She was prescribed amoxicillin to take.  Pt called back today with right facial swelling, and pain with the right ear. She also states that she hears humming all the time. Drainage coming out of her eye and pain at the eye socket. She is also c/o having a headache. Also she is stating her teeth hurt. Still having cough and stuffiness.  She has taken Advil along with the antibiotic. She does not think she has a fever.  Advised to go to an urgent care. Pt voiced understanding and her husband will be taking her. Routing to [facility] at [facility].  Reason for Disposition . Swelling is painful to touch  Answer Assessment - Initial Assessment Questions 1. ONSET: "When did the swelling start?" (e.g., minutes, hours, days)     today 2. LOCATION: "What part of the face is swollen?"      Right side of face 3. SEVERITY: "How swollen is it?"      4. ITCHING: "Is there any itching?" If so, ask: "How much?"   (Scale 1-10; mild, moderate or severe)     no 5. PAIN: "Is the swelling painful to touch?" If so, ask: "How painful is it?"   (Scale 1-10; mild, moderate or severe)     yes 6. FEVER: "Do you have a fever?" If so, ask: "What is it, how was it measured, and when did it start?"      Not sure 7. CAUSE: "What do you think is causing the face swelling?"     no 8. RECURRENT SYMPTOM: "Have you had face swelling before?" If so, ask: "When was the last time?" "What happened that time?"     9. OTHER SYMPTOMS: "Do you have any other symptoms?" (e.g., toothache, leg swelling)     Teeth aching, pain around eye socket and headache 10. PREGNANCY: "Is there any chance you are pregnant?" "When was your last menstrual period?"       no  Protocols used: [facility]

## 2018-07-07 NOTE — Telephone Encounter (Signed)
Pt called requesting to have a referral to an ENT. She hears humming all the time. No one else can hear it. She would like someone that is in the [facility] network to see her. She was also wandering about getting a hearing test done. Thinks her hearing is decreased. Request a call back regarding this matter.

## 2018-07-08 ENCOUNTER — Encounter: Payer: Self-pay | Admitting: Family Medicine

## 2018-07-08 ENCOUNTER — Ambulatory Visit (INDEPENDENT_AMBULATORY_CARE_PROVIDER_SITE_OTHER): Payer: Managed Care, Other (non HMO) | Admitting: Family Medicine

## 2018-07-08 VITALS — BP 140/86 | HR 103 | Temp 98.8°F | Ht 67.0 in | Wt 232.0 lb

## 2018-07-08 DIAGNOSIS — J0100 Acute maxillary sinusitis, unspecified: Secondary | ICD-10-CM | POA: Diagnosis not present

## 2018-07-08 MED ORDER — METHYLPREDNISOLONE ACETATE 80 MG/ML IJ SUSP
80.0000 mg | Freq: Once | INTRAMUSCULAR | Status: AC
  Administered 2018-07-08: 80 mg via INTRAMUSCULAR

## 2018-07-08 MED ORDER — DOXYCYCLINE HYCLATE 100 MG PO TABS: 100.0000 mg | ORAL_TABLET | Freq: Two times a day (BID) | ORAL | 0 refills | Status: AC

## 2018-07-08 NOTE — Addendum Note (Signed)
Addended by: Sharon Seller B on: 07/08/2018 03:48 PM   Modules accepted: Orders

## 2018-07-08 NOTE — Addendum Note (Signed)
Addended by: Sharon Seller B on: 07/08/2018 07:32 AM   Modules accepted: Orders

## 2018-07-08 NOTE — Telephone Encounter (Signed)
Referral done

## 2018-07-08 NOTE — Progress Notes (Signed)
Pre visit review using our clinic review tool, if applicable. No additional management support is needed unless otherwise documented below in the visit note. 

## 2018-07-08 NOTE — Telephone Encounter (Signed)
That's fine. [facility] doesn't have anyone in ENT, but [facility] does. We can refer there. TY.

## 2018-07-08 NOTE — Patient Instructions (Signed)
Stop the amoxicillin.  Start the doxycycline tomorrow if you don't feel better or feel worse.  Continue to push fluids, practice good hand hygiene, and cover your mouth if you cough.  If you start having fevers, shaking or shortness of breath, seek immediate care.  For symptoms, consider using Vick's VapoRub on chest or under nose, air humidifier, Benadryl at night, and elevating the head of the bed. Tylenol and ibuprofen for aches and pains you may be experiencing.   Let us know if you need anything.

## 2018-07-08 NOTE — Progress Notes (Signed)
Chief Complaint  Patient presents with  . Sinusitis  . Headache  . Eye Drainage    Tonya [name] here for URI complaints.  Duration: 1 week  Associated symptoms: sinus congestion, R sinus pain, R sided dental pain, rhinorrhea, itchy watery eyes, ear pain, wheezing and cough Denies: ear drainage, sore throat, shortness of breath, myalgia and fevers Treatment to date: amoxicillin started 2 d ago for ear infection, Breo Sick contacts: Yes  ROS:  Const: Denies fevers HEENT: As noted in HPI Lungs: No SOB  Past Medical History:  Diagnosis Date  . Hypertension   . Osteopenia     BP 140/86 (BP Location: Left Arm, Patient Position: Sitting, Cuff Size: Normal)   Pulse (!) 103   Temp 98.8 F (37.1 C) (Oral)   Ht 5\' 7"  (1.702 m)   Wt 232 lb (105.2 kg)   SpO2 97%   BMI 36.34 kg/m  General: Awake, alert, appears stated age HEENT: AT, [place], ears patent b/l and TM's neg, nares patent w/o discharge, +TTP over R max sinus, pharynx pink and without exudates, MMM Neck: No masses or asymmetry Heart: RRR Lungs: CTAB, no accessory muscle use Psych: Age appropriate judgment and insight, normal mood and affect  Acute maxillary sinusitis, recurrence not specified - Plan: doxycycline (VIBRA-TABS) 100 MG tablet  Orders as above. Stop amoxicillin. Start doxy tomorrow if she does not feel better or is worsening.  Continue to push fluids, practice good hand hygiene, cover mouth when coughing. F/u prn. If starting to experience fevers, shaking, or shortness of breath, seek immediate care. Pt voiced understanding and agreement to the plan.  [place], DO 07/08/18 3:41 PM

## 2018-07-11 ENCOUNTER — Telehealth: Payer: Self-pay | Admitting: Family Medicine

## 2018-07-11 NOTE — Telephone Encounter (Signed)
Called the patient to followup on triage call from 07/09/2018 and how she is doing. She states as of today 07/11/2018 beginning to slowly get better.  Still having headaches (on the right side of her face) and facial pressure, no fever, coughing up/and blowing out a lot of discolored phelgm. She is sleeping better. Her concern are the headaches at this time and facial pressure. Offered her an appointment.  She declined and will wait to see how she is in the morning.  If not improving will give [place] a call to schedule with someone. She is aware PCP is out of the office until Wednesday. She did get the doxycycline filled on Saturday. The first dose on Saturday caused a little diarrhea, but happened only once. Advise please if any other options to relieve the HA's//facial pain.

## 2018-07-12 NOTE — Telephone Encounter (Signed)
May be dealing with a virus with poor response to antibiotic. Tylenol, Aleve for pain in meanwhile. TY.

## 2018-07-13 ENCOUNTER — Ambulatory Visit: Payer: Managed Care, Other (non HMO) | Admitting: Family Medicine

## 2018-07-13 NOTE — Telephone Encounter (Signed)
Patient has appt with PCP today

## 2018-07-27 ENCOUNTER — Ambulatory Visit (HOSPITAL_BASED_OUTPATIENT_CLINIC_OR_DEPARTMENT_OTHER)
Admission: RE | Admit: 2018-07-27 | Discharge: 2018-07-27 | Disposition: A | Discharge status: Home / Self Care | Payer: Managed Care, Other (non HMO) | Source: Ambulatory Visit | Attending: Family Medicine | Admitting: Family Medicine

## 2018-07-27 ENCOUNTER — Ambulatory Visit (INDEPENDENT_AMBULATORY_CARE_PROVIDER_SITE_OTHER): Payer: Managed Care, Other (non HMO) | Admitting: Family Medicine

## 2018-07-27 ENCOUNTER — Encounter: Payer: Self-pay | Admitting: Family Medicine

## 2018-07-27 ENCOUNTER — Telehealth: Payer: Self-pay | Admitting: Family Medicine

## 2018-07-27 VITALS — BP 118/68 | HR 92 | Temp 98.4°F | Ht 67.0 in | Wt 228.4 lb

## 2018-07-27 DIAGNOSIS — R05 Cough: Secondary | ICD-10-CM | POA: Insufficient documentation

## 2018-07-27 DIAGNOSIS — J069 Acute upper respiratory infection, unspecified: Secondary | ICD-10-CM | POA: Diagnosis not present

## 2018-07-27 DIAGNOSIS — R059 Cough, unspecified: Secondary | ICD-10-CM

## 2018-07-27 IMAGING — DX DG CHEST 2V
2 series · 2 of 2 positions shown · non-contrast
Comparison: [DATE]

CLINICAL DATA: Sinus congestion and rhinorrhea.

EXAM:
CHEST - 2 VIEW

[chest pa]
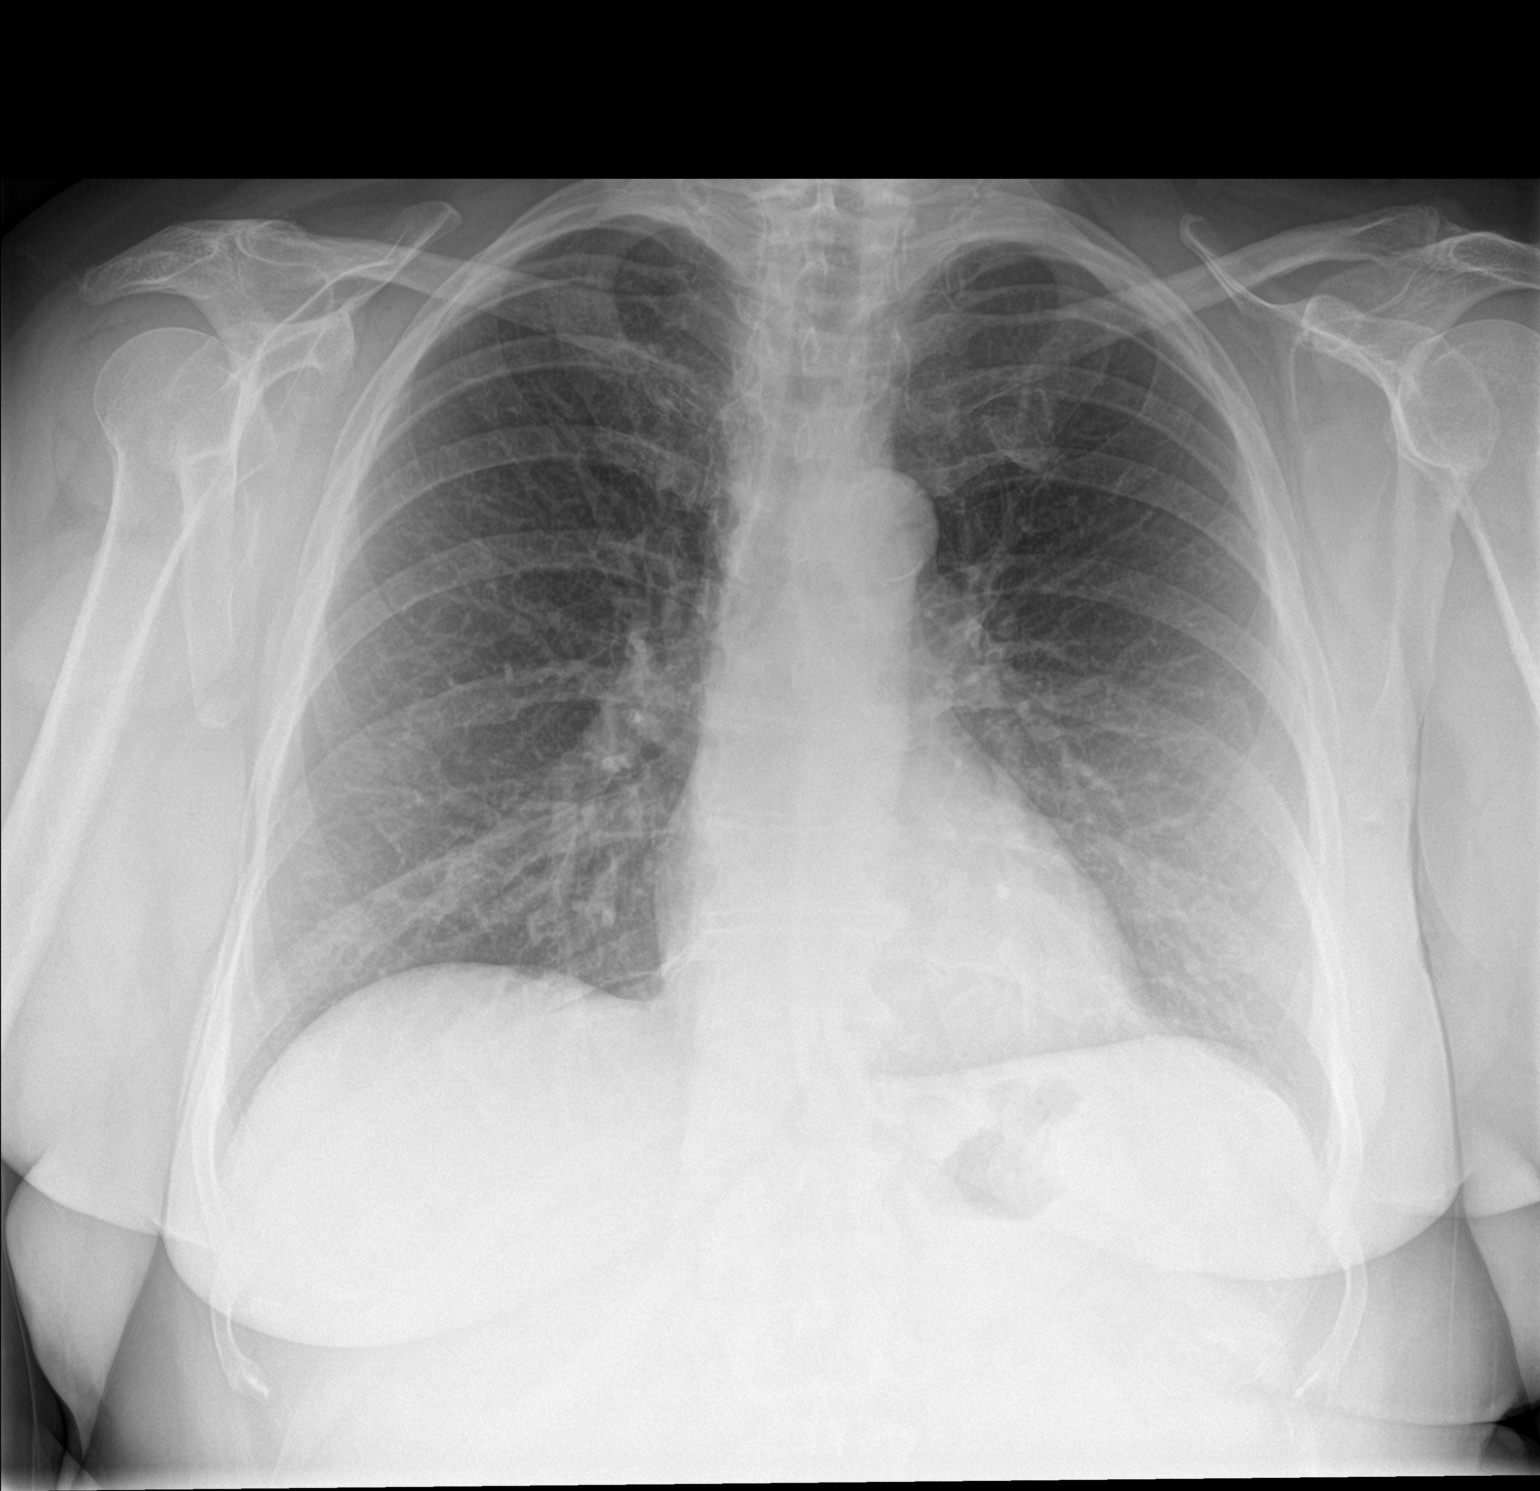

[chest lat]
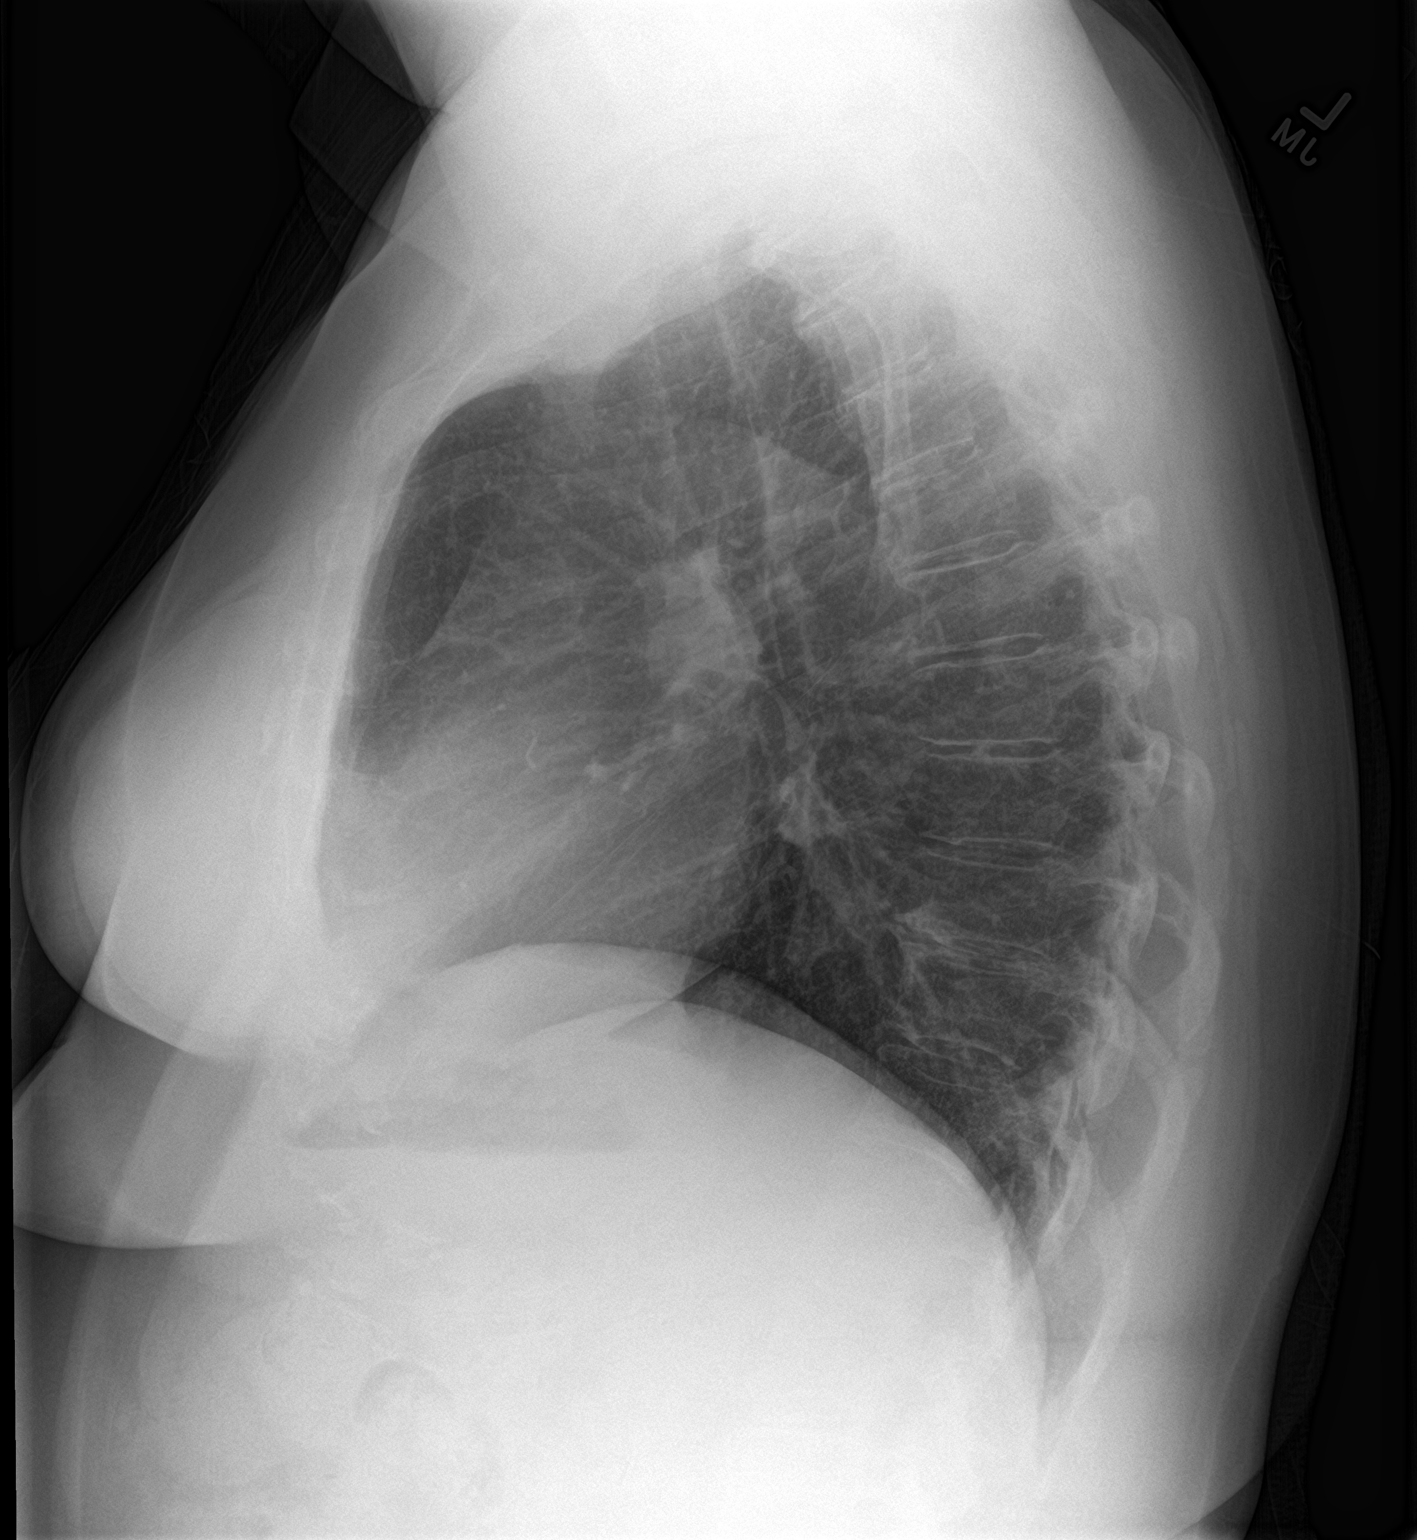

[2 of 2 positions shown; findings below may reference images not displayed]

FINDINGS: The heart size and mediastinal contours are within normal limits.
Both lungs are clear. The visualized skeletal structures are
unremarkable.
IMPRESSION: No active cardiopulmonary disease.

## 2018-07-27 MED ORDER — ALBUTEROL SULFATE 108 (90 BASE) MCG/ACT IN AEPB: 1.0000 | INHALATION_SPRAY | Freq: Four times a day (QID) | RESPIRATORY_TRACT | 1 refills | Status: DC | PRN

## 2018-07-27 MED ORDER — FLUTICASONE FUROATE-VILANTEROL 200-25 MCG/INH IN AEPB: 1.0000 | INHALATION_SPRAY | Freq: Every day | RESPIRATORY_TRACT | 0 refills | Status: DC

## 2018-07-27 NOTE — Patient Instructions (Signed)
Continue to push fluids, practice good hand hygiene, and cover your mouth if you cough.  If you start having fevers, shaking or shortness of breath, seek immediate care.  Think about a nasal spray.  We will be in touch regarding your X-ray results.   Let us know if you need anything.

## 2018-07-27 NOTE — Progress Notes (Signed)
Chief Complaint  Patient presents with  . Cough    runny nose    Tonya [name] here for URI complaints.  Duration: 4 days; unsure if she ever got better from the issue she was having several weeks ago. Associated symptoms: sinus congestion, rhinorrhea,- itchy watery eyes, cough and sore throat Denies: sinus pain, red eyes, ear pain, ear drainage, shortness of breath and fevers Treatment to date: None Sick contacts: Yes  Takes Singulair at night which does help with some congestion/drainage.  ROS:  Const: Denies fevers HEENT: As noted in HPI Lungs: +cough  Past Medical History:  Diagnosis Date  . Hypertension   . Osteopenia     BP 118/68 (BP Location: Left Arm, Patient Position: Sitting, Cuff Size: Large)   Pulse 92   Temp 98.4 F (36.9 C) (Oral)   Ht 5\' 7"  (1.702 m)   Wt 228 lb 6 oz (103.6 kg)   SpO2 97%   BMI 35.77 kg/m  General: Awake, alert, appears stated age HEENT: AT, [place], ears patent b/l and TM's neg, nares patent w/o discharge, pharynx pink and without exudates, MMM Neck: No masses or asymmetry Heart: RRR Lungs: CTAB, no accessory muscle use Psych: Age appropriate judgment and insight, normal mood and affect  Cough - Plan: DG Chest 2 View, fluticasone furoate-vilanterol (BREO ELLIPTA) 200-25 MCG/INH AEPB, Albuterol Sulfate 108 (90 Base) MCG/ACT AEPB  Upper respiratory tract infection, unspecified type  Orders as above. Continue to push fluids, practice good hand hygiene, cover mouth when coughing. F/u prn. If starting to experience fevers, shaking, or shortness of breath, seek immediate care. Pt voiced understanding and agreement to the plan.  Greater than 25 minutes were spent face to face with the patient with greater than 50% of this time spent counseling on etiologies for above, treatment options and follow up care.   [place], DO 07/27/18 11:59 AM

## 2018-07-27 NOTE — Telephone Encounter (Signed)
Called the patient informed of PCP instructions 

## 2018-07-27 NOTE — Progress Notes (Signed)
Pre visit review using our clinic review tool, if applicable. No additional management support is needed unless otherwise documented below in the visit note. 

## 2018-07-27 NOTE — Telephone Encounter (Signed)
Called the patient informed of PCP instructions. She is taking singular at night---- should she add zyrtec as well to take daily--ok to take with singular?? If she starts zyrtec how long should she be on it.??

## 2018-07-27 NOTE — Telephone Encounter (Signed)
I tried to see if there were any other options for this. Have her call her insurance company and see what they will cover for a rescue inhaler and a daily maintenance inhaler. We can call in what they prefer. TY.

## 2018-07-27 NOTE — Telephone Encounter (Signed)
Flonase preferable, though I know her opinion of nasal sprays, OK to take Zyrtec daily for 4 weeks. OK to take with Singulair. TY.

## 2018-07-27 NOTE — Telephone Encounter (Signed)
Copied from [place] [id number]. Topic: Quick Communication - Rx Refill/Question >> Jul 27, 2018 10:24 AM Rayann Heman wrote: Medication: fluticasone furoate-vilanterol (BREO ELLIPTA) 200-25 MCG/INH AEPB [[id number]] Albuterol Sulfate 108 (90 Base) MCG/ACT AEPB [[id number]] pt called and stated that these are too expensive and would like to know if something cheaper could be called in. Please advise

## 2018-07-29 ENCOUNTER — Telehealth: Payer: Self-pay | Admitting: Family Medicine

## 2018-07-29 MED ORDER — AZITHROMYCIN 250 MG PO TABS: ORAL_TABLET | ORAL | 0 refills | Status: DC

## 2018-07-29 NOTE — Telephone Encounter (Signed)
Copied from [place] [id number]. Topic: Quick Communication - Rx Refill/Question >> Jul 29, 2018  9:07 AM Scherrie Gerlach wrote: Medication: Zpak (?) Pt has been seen 3 times in the past 2 weeks.  Pt states she is not any better.  Coughing up green mucus. Lots of drainage. Pt getting worse day by day.   Pt states there is definitely something else going on besides allergies. Pt states she has never had sinus issues in the past. Pt taking the allergy meds, nothing is helping.  Pt is requesting an abx or z pak to help get rid of this.  [id number] [address], [place] [phone] (Phone) [phone] (Fax)

## 2018-10-19 ENCOUNTER — Encounter: Payer: Self-pay | Admitting: Family Medicine

## 2018-10-19 ENCOUNTER — Other Ambulatory Visit: Payer: Self-pay

## 2018-10-19 ENCOUNTER — Ambulatory Visit (INDEPENDENT_AMBULATORY_CARE_PROVIDER_SITE_OTHER): Payer: Managed Care, Other (non HMO) | Admitting: Family Medicine

## 2018-10-19 DIAGNOSIS — K219 Gastro-esophageal reflux disease without esophagitis: Secondary | ICD-10-CM | POA: Diagnosis not present

## 2018-10-19 DIAGNOSIS — W19XXXA Unspecified fall, initial encounter: Secondary | ICD-10-CM | POA: Diagnosis not present

## 2018-10-19 DIAGNOSIS — I10 Essential (primary) hypertension: Secondary | ICD-10-CM | POA: Diagnosis not present

## 2018-10-19 MED ORDER — TIZANIDINE HCL 4 MG PO TABS: 4.0000 mg | ORAL_TABLET | Freq: Three times a day (TID) | ORAL | 0 refills | Status: DC | PRN

## 2018-10-19 MED ORDER — MELOXICAM 15 MG PO TABS: 15.0000 mg | ORAL_TABLET | Freq: Every day | ORAL | 0 refills | Status: DC

## 2018-10-19 NOTE — Patient Instructions (Signed)
Heat (pad or rice pillow in microwave) over affected area, 10-15 minutes twice daily.   Ice/cold pack over area for 10-15 min twice daily.  OK to take Tylenol 1000 mg (2 extra strength tabs) or 975 mg (3 regular strength tabs) every 6 hours as needed.  Knee Exercises It is normal to feel mild stretching, pulling, tightness, or discomfort as you do these exercises, but you should stop right away if you feel sudden pain or your pain gets worse. STRETCHING AND RANGE OF MOTION EXERCISES  These exercises warm up your muscles and joints and improve the movement and flexibility of your knee. These exercises also help to relieve pain, numbness, and tingling. Exercise A: Knee Extension, Prone  1. Lie on your abdomen on a bed. 2. Place your left / right knee just beyond the edge of the surface so your knee is not on the bed. You can put a towel under your left / right thigh just above your knee for comfort. 3. Relax your leg muscles and allow gravity to straighten your knee. You should feel a stretch behind your left / right knee. 4. Hold this position for 30 seconds. 5. Scoot up so your knee is supported between repetitions. Repeat 2 times. Complete this stretch 3 times per week. Exercise B: Knee Flexion, Active    1. Lie on your back with both knees straight. If this causes back discomfort, bend your left / right knee so your foot is flat on the floor. 2. Slowly slide your left / right heel back toward your buttocks until you feel a gentle stretch in the front of your knee or thigh. 3. Hold this position for 30 seconds. 4. Slowly slide your left / right heel back to the starting position. Repeat 2 times. Complete this exercise 3 times per week. Exercise C: Quadriceps, Prone    1. Lie on your abdomen on a firm surface, such as a bed or padded floor. 2. Bend your left / right knee and hold your ankle. If you cannot reach your ankle or pant leg, loop a belt around your foot and grab the belt  instead. 3. Gently pull your heel toward your buttocks. Your knee should not slide out to the side. You should feel a stretch in the front of your thigh and knee. 4. Hold this position for 30 seconds. Repeat 2 times. Complete this stretch 3 times per week. Exercise D: Hamstring, Supine  1. Lie on your back. 2. Loop a belt or towel over the ball of your left / right foot. The ball of your foot is on the walking surface, right under your toes. 3. Straighten your left / right knee and slowly pull on the belt to raise your leg until you feel a gentle stretch behind your knee. ? Do not let your left / right knee bend while you do this. ? Keep your other leg flat on the floor. 4. Hold this position for 30 seconds. Repeat 2 times. Complete this stretch 3 times per week. STRENGTHENING EXERCISES  These exercises build strength and endurance in your knee. Endurance is the ability to use your muscles for a long time, even after they get tired. Exercise E: Quadriceps, Isometric    1. Lie on your back with your left / right leg extended and your other knee bent. Put a rolled towel or small pillow under your knee if told by your health care provider. 2. Slowly tense the muscles in the front of your left /  right thigh. You should see your kneecap slide up toward your hip or see increased dimpling just above the knee. This motion will push the back of the knee toward the floor. 3. For 3 seconds, keep the muscle as tight as you can without increasing your pain. 4. Relax the muscles slowly and completely. Repeat for 10 total reps Repeat 2 ti mes. Complete this exercise 3 times per week. Exercise F: Straight Leg Raises - Quadriceps  1. Lie on your back with your left / right leg extended and your other knee bent. 2. Tense the muscles in the front of your left / right thigh. You should see your kneecap slide up or see increased dimpling just above the knee. Your thigh may even shake a bit. 3. Keep these  muscles tight as you raise your leg 4-6 inches (10-15 cm) off the floor. Do not let your knee bend. 4. Hold this position for 3 seconds. 5. Keep these muscles tense as you lower your leg. 6. Relax your muscles slowly and completely after each repetition. 10 total reps. Repeat 2 times. Complete this exercise 3 times per week.  Exercise G: Hamstring Curls    If told by your health care provider, do this exercise while wearing ankle weights. Begin with 5 lb weights (optional). Then increase the weight by 1 lb (0.5 kg) increments. Do not wear ankle weights that are more than 20 lbs to start with. 1. Lie on your abdomen with your legs straight. 2. Bend your left / right knee as far as you can without feeling pain. Keep your hips flat against the floor. 3. Hold this position for 3 seconds. 4. Slowly lower your leg to the starting position. Repeat for 10 reps.  Repeat 2 times. Complete this exercise 3 times per week. Exercise H: Squats (Quadriceps)  1. Stand in front of a table, with your feet and knees pointing straight ahead. You may rest your hands on the table for balance but not for support. 2. Slowly bend your knees and lower your hips like you are going to sit in a chair. ? Keep your weight over your heels, not over your toes. ? Keep your lower legs upright so they are parallel with the table legs. ? Do not let your hips go lower than your knees. ? Do not bend lower than told by your health care provider. ? If your knee pain increases, do not bend as low. 3. Hold the squat position for 1 second. 4. Slowly push with your legs to return to standing. Do not use your hands to pull yourself to standing. Repeat 2 times. Complete this exercise 3 times per week. Exercise I: Wall Slides (Quadriceps)    1. Lean your back against a smooth wall or door while you walk your feet out 18-24 inches (46-61 cm) from it. 2. Place your feet hip-width apart. 3. Slowly slide down the wall or door until  your knees Repeat 2 times. Complete this exercise every other day. 4. Exercise K: Straight Leg Raises - Hip Abductors  1. Lie on your side with your left / right leg in the top position. Lie so your head, shoulder, knee, and hip line up. You may bend your bottom knee to help you keep your balance. 2. Roll your hips slightly forward so your hips are stacked directly over each other and your left / right knee is facing forward. 3. Leading with your heel, lift your top leg 4-6 inches (10-15 cm). You  should feel the muscles in your outer hip lifting. ? Do not let your foot drift forward. ? Do not let your knee roll toward the ceiling. 4. Hold this position for 3 seconds. 5. Slowly return your leg to the starting position. 6. Let your muscles relax completely after each repetition. 10 total reps. Repeat 2 times. Complete this exercise 3 times per week. Exercise J: Straight Leg Raises - Hip Extensors  1. Lie on your abdomen on a firm surface. You can put a pillow under your hips if that is more comfortable. 2. Tense the muscles in your buttocks and lift your left / right leg about 4-6 inches (10-15 cm). Keep your knee straight as you lift your leg. 3. Hold this position for 3 seconds. 4. Slowly lower your leg to the starting position. 5. Let your leg relax completely after each repetition. Repeat 2 times. Complete this exercise 3 times per week. Document Released: 04/22/2005 Document Revised: 03/02/2016 Document Reviewed: 04/14/2015 Elsevier Interactive Patient Education  2017 [employer].

## 2018-10-19 NOTE — Progress Notes (Signed)
Chief Complaint  Patient presents with  . Follow-up    Subjective Tonya [name] is a 68 y.o. female who presents for hypertension follow up. Due to COVID-19 pandemic, we are interacting via web portal for an electronic face-to-face visit. I verified patient's ID using 2 identifiers. Patient agreed to proceed with visit via this method. Patient is at home, I am at office.   She does not monitor home blood pressures. She is compliant with medications- losartan 50 mg/d, Toprol XL 25 mg/d. Patient has these side effects of medication: none She is adhering to a healthy diet overall. Current exercise: some walking  4.5 weeks ago she fell off of a ladder approx 4 feet from ground. Twisted knee and fell on RL back. Still having pain. Swelling in L knee and instability. She had had issues with this knee in past. No bruising. Low back hurts the most when she is standing for periods of time. This had been a chronic issue also.    GERD- well controlled on Protonix. No AE's, reports compliance. Had bad s/s's when she tried to come off in past.     Past Medical History:  Diagnosis Date  . Hypertension   . Osteopenia     Review of Systems Cardiovascular: no chest pain Respiratory:  no shortness of breath  Exam No conversational dyspnea Age appropriate judgment and insight Nml affect and mood  Fall, initial encounter - Plan: meloxicam (MOBIC) 15 MG tablet, tiZANidine (ZANAFLEX) 4 MG tablet  Essential hypertension  Gastroesophageal reflux disease, esophagitis presence not specified  1- Stretches/exercises from PT HEP in past. Will mail her knee stretches/exercises. Some concern for meniscus/ligamentaous damage given swelling, but without exam she wishes for conservative approach. Heat, ice, Tylenol. If no improvement after 3-4 weeks, will have low threshold for in-person visit or referral to sports med.   2- Cont BB and ARB. Counseled on diet and exercise. 3- Cont PPI F/u in 6 mo for CPE,  prn for #1. The patient voiced understanding and agreement to the plan.  [place], DO 10/19/18  11:06 AM

## 2018-10-20 ENCOUNTER — Ambulatory Visit: Payer: Self-pay

## 2018-10-20 NOTE — Telephone Encounter (Signed)
Yes, that's fine to do half. TY.

## 2018-10-20 NOTE — Telephone Encounter (Signed)
Pt had "crazy dreams" Felt out of it last night Wants to continue take it but wants to cut back on the dose. Asking if she can take 1/2 half tablet during the day and a 1 tab at night. Pt was concerned about how she felt but wants to get the benefit of the medication. "crazy nightmares" Please have provider review and advise pt.   Reason for Disposition . Caller has NON-URGENT medication question about med that PCP prescribed and triager unable to answer question  Answer Assessment - Initial Assessment Questions 1. SYMPTOMS: "Do you have any symptoms?"     Pt had "crazy dreams" Felt out of it last night Wants to continue take it but wants to cut back on the dose. Asking if she can take 1/2 half tablet during the day and a 1 tab at night. Pt was concerned about how she felt but wants to get the benefit of the medication. "crazy nightmares" 2. SEVERITY: If symptoms are present, ask "Are they mild, moderate or severe?"     mild  Protocols used: MEDICATION QUESTION CALL-A-AH

## 2018-10-21 NOTE — Telephone Encounter (Signed)
Called left detailed message of PCP instructions. 

## 2018-10-31 ENCOUNTER — Telehealth: Payer: Self-pay

## 2018-10-31 ENCOUNTER — Encounter: Payer: Self-pay | Admitting: Family Medicine

## 2018-10-31 ENCOUNTER — Ambulatory Visit (INDEPENDENT_AMBULATORY_CARE_PROVIDER_SITE_OTHER): Payer: Managed Care, Other (non HMO) | Admitting: Family Medicine

## 2018-10-31 ENCOUNTER — Other Ambulatory Visit: Payer: Self-pay

## 2018-10-31 DIAGNOSIS — F418 Other specified anxiety disorders: Secondary | ICD-10-CM | POA: Diagnosis not present

## 2018-10-31 DIAGNOSIS — Z630 Problems in relationship with spouse or partner: Secondary | ICD-10-CM

## 2018-10-31 DIAGNOSIS — Z7189 Other specified counseling: Secondary | ICD-10-CM

## 2018-10-31 NOTE — Progress Notes (Signed)
Chief Complaint  Patient presents with  . Possible exposure to Covid    Subjective: Patient is a 68 y.o. female here for concerns about COVID-19. Due to COVID-19 pandemic, we are interacting via web portal for an electronic face-to-face visit. I verified patient's ID using 2 identifiers. Patient agreed to proceed with visit via this method. Patient is at home, I am at office. Patient and I are present for visit.   Patient is experiencing marital strife.  Her husband is a workaholic and she thinks he may be depressed.  She feels their relationship is struggling.  He seems uninterested in her and seems to do things without her input or consent.  He recently went up to [place] to help move deceased patients with COVID through a funeral home his brother owns.  She is concerned about contracting COVID and also about their relationship.  She is interested in getting set up with couple's counseling.  ROS: Const: no fevers Psych: +anxiety  Past Medical History:  Diagnosis Date  . Hypertension   . Osteopenia     Objective: No conversational dyspnea Age appropriate judgment and insight Nml affect and mood  Assessment and Plan: Educated About Covid-19 Virus Infection  Situational anxiety  Marital problems  We discussed social distancing and is affecting everything in the house once her husband returns.  After resume to the regular living. I did suggest [facility] couples counseling.  I provided contact information for this.  I encouraged the patient to form her husband the many feelings she discussed with me today should he refuse see counseling.  I cautioned against ultimatums but did state that is not advisable to stay in unfavorable situation. Her husband is also a patient of mine.  I told her that he is welcome to discuss any issues with me as well. The patient voiced understanding and agreement to the plan.  42 minutes were spent face to face with the patient with greater than  50% of this time spent counseling on COVID, self quarantining, disinfecting, counseling, and situational anxiety.    [place], DO 10/31/18  4:49 PM

## 2018-10-31 NOTE — Telephone Encounter (Signed)
Copied from [place] [id number]. Topic: General - Other >> Oct 31, 2018 11:55 AM Tonya [name] wrote: Reason for CRM: The patient wants a call back from Dr. Nani Ravens because her husband has went to [place] to help his brother with his funeral service and specifically with people who have passed because of Covid 19. The patient wants to speak to the Doctor because he knows about her husbands ailments and she wants to know what she should do when he comes back because he has been around people who had Covid 75

## 2018-10-31 NOTE — Telephone Encounter (Signed)
Someone needs to quarantine for the next 14 d. Likely him, but if he is out and about, she needs to stay away from him and disinfect everything he comes into contact with. Ty.

## 2018-10-31 NOTE — Telephone Encounter (Signed)
Spoke to the wife ([name] is in [place]).  Did inform her of PCP instructions. The patient verbalized as conversation progressed concern regarding husbands state of mind currently.  He is working constantly, being verbally abusive to her at times.  She does not understand why he would want to handle dead bodies (Covid ) in [place] and expose himself possible.  The wife states they are doing a lot of arguing/and life has been very difficult living with him.  She thinks he is very "depressed/even manic" but will not/has never discuss with anyone. She states he plans to come back home without any concern of exposure he may have had while out of town.  Advised her to schedule with PCP today at 4 for a virtual visit. Made PCP aware of conversation.

## 2018-10-31 NOTE — Telephone Encounter (Signed)
Addressed at OV

## 2018-11-08 ENCOUNTER — Ambulatory Visit (INDEPENDENT_AMBULATORY_CARE_PROVIDER_SITE_OTHER): Payer: 59 | Admitting: Psychology

## 2018-11-08 DIAGNOSIS — F341 Dysthymic disorder: Secondary | ICD-10-CM | POA: Diagnosis not present

## 2018-11-08 DIAGNOSIS — F4321 Adjustment disorder with depressed mood: Secondary | ICD-10-CM | POA: Diagnosis not present

## 2018-11-10 ENCOUNTER — Ambulatory Visit: Payer: Self-pay | Admitting: Family Medicine

## 2018-11-10 ENCOUNTER — Telehealth: Payer: Self-pay | Admitting: Family Medicine

## 2018-11-10 MED ORDER — FLUCONAZOLE 150 MG PO TABS: 150.0000 mg | ORAL_TABLET | Freq: Once | ORAL | 0 refills | Status: AC

## 2018-11-10 NOTE — Telephone Encounter (Signed)
Patient called stating she has a possible yeast infection. Not sexually active. Vaginal Itching, red and irritated, burns when urinating. Patient has had multiple appointment within the last couple months and was hoping to avoid another visit. Please advise if prescription can be called in or if virtual visit is required.

## 2018-11-10 NOTE — Telephone Encounter (Signed)
Pt called in c/o vaginal burning, itching, and a white discharge since Monday.   Not on antibiotics.   I'm on Meloxicam for soreness from a fall from a ladder.  Is this from that medication?  See triage notes.  I sent these notes to Dr. Irene Limbo office.  I warm transferred the call to Kirsten at Dr. Irene Limbo office to be scheduled.  (COVID-19 pandemic)       Reason for Disposition . [1] Symptoms of a "yeast infection" (i.e., itchy, white discharge, not bad smelling) AND [2] not improved > 3 days following CARE ADVICE  Answer Assessment - Initial Assessment Questions 1. DISCHARGE: "Describe the discharge." (e.g., white, yellow, green, gray, foamy, cottage cheese-like)     I've been on Meloxicam.   I'm having pain with urination then burning.  I see a lot of redness and white discharge.  It's itching.  Maybe yeast infection or UTI.  It's not from sex.  Is it from the medication. 2. ODOR: "Is there a bad odor?"     No 3. ONSET: "When did the discharge begin?"     Monday evening burning began.   4. RASH: "Is there a rash in that area?" If so, ask: "Describe it." (e.g., redness, blisters, sores, bumps)     Redness in perineal area. 5. ABDOMINAL PAIN: "Are you having any abdominal pain?" If yes: "What does it feel like? " (e.g., crampy, dull, intermittent, constant)      A little low in pubic area.   6. ABDOMINAL PAIN SEVERITY: If present, ask: "How bad is it?"  (e.g., mild, moderate, severe)  - MILD - doesn't interfere with normal activities   - MODERATE - interferes with normal activities or awakens from sleep   - SEVERE - patient doesn't want to move (R/O peritonitis)      Mild 7. CAUSE: "What do you think is causing the discharge?" "Have you had the same problem before? What happened then?"     The meloxican maybe. 8. OTHER SYMPTOMS: "Do you have any other symptoms?" (e.g., fever, itching, vaginal bleeding, pain with urination, injury to genital area, vaginal foreign body)     No  fever.   See above for symptoms. 9. PREGNANCY: "Is there any chance you are pregnant?" "When was your last menstrual period?"     Not asked due to age  Protocols used: [place]

## 2018-11-10 NOTE — Telephone Encounter (Signed)
This is the same information sent earlier/different message

## 2018-11-10 NOTE — Telephone Encounter (Signed)
I spoke to the patient. She has had some pain when urinating//burning.  But she stopped the moloxicam (after reading the side effects) and is a little better today.  She did look with a mirror and is red/some white spots/discharge.  She wondered about a yeast infection/or UTI.  Also she stated PCP is aware of marital problems and she has had no intercourse with her husband since Jan. And she is not concerned of a STD. She does not want another virtual appt.

## 2018-11-10 NOTE — Telephone Encounter (Signed)
Will call something in for yeast since we recently had appt. If no better, will need appt. Ty.

## 2018-11-11 NOTE — Telephone Encounter (Signed)
Patient informed of prescription sent in and informed of PCP instructions.

## 2018-11-15 ENCOUNTER — Ambulatory Visit (INDEPENDENT_AMBULATORY_CARE_PROVIDER_SITE_OTHER): Payer: 59 | Admitting: Psychology

## 2018-11-15 DIAGNOSIS — F4321 Adjustment disorder with depressed mood: Secondary | ICD-10-CM

## 2018-11-15 DIAGNOSIS — F341 Dysthymic disorder: Secondary | ICD-10-CM | POA: Diagnosis not present

## 2018-11-18 ENCOUNTER — Ambulatory Visit (INDEPENDENT_AMBULATORY_CARE_PROVIDER_SITE_OTHER): Payer: 59 | Admitting: Psychology

## 2018-11-18 DIAGNOSIS — F341 Dysthymic disorder: Secondary | ICD-10-CM

## 2018-11-18 DIAGNOSIS — F4321 Adjustment disorder with depressed mood: Secondary | ICD-10-CM

## 2018-11-22 ENCOUNTER — Ambulatory Visit (INDEPENDENT_AMBULATORY_CARE_PROVIDER_SITE_OTHER): Payer: 59 | Admitting: Psychology

## 2018-11-22 DIAGNOSIS — F341 Dysthymic disorder: Secondary | ICD-10-CM

## 2018-11-22 DIAGNOSIS — F4321 Adjustment disorder with depressed mood: Secondary | ICD-10-CM

## 2018-11-25 ENCOUNTER — Ambulatory Visit (INDEPENDENT_AMBULATORY_CARE_PROVIDER_SITE_OTHER): Payer: 59 | Admitting: Psychology

## 2018-11-25 DIAGNOSIS — F341 Dysthymic disorder: Secondary | ICD-10-CM | POA: Diagnosis not present

## 2018-11-25 DIAGNOSIS — F4322 Adjustment disorder with anxiety: Secondary | ICD-10-CM | POA: Diagnosis not present

## 2018-11-30 ENCOUNTER — Ambulatory Visit: Payer: 59 | Admitting: Psychology

## 2018-12-02 ENCOUNTER — Ambulatory Visit (INDEPENDENT_AMBULATORY_CARE_PROVIDER_SITE_OTHER): Payer: 59 | Admitting: Psychology

## 2018-12-02 DIAGNOSIS — F341 Dysthymic disorder: Secondary | ICD-10-CM | POA: Diagnosis not present

## 2018-12-02 DIAGNOSIS — F4321 Adjustment disorder with depressed mood: Secondary | ICD-10-CM | POA: Diagnosis not present

## 2018-12-05 ENCOUNTER — Ambulatory Visit (INDEPENDENT_AMBULATORY_CARE_PROVIDER_SITE_OTHER): Payer: 59 | Admitting: Psychology

## 2018-12-05 DIAGNOSIS — F4321 Adjustment disorder with depressed mood: Secondary | ICD-10-CM

## 2018-12-05 DIAGNOSIS — F341 Dysthymic disorder: Secondary | ICD-10-CM | POA: Diagnosis not present

## 2018-12-07 ENCOUNTER — Ambulatory Visit (INDEPENDENT_AMBULATORY_CARE_PROVIDER_SITE_OTHER): Payer: 59 | Admitting: Psychology

## 2018-12-07 DIAGNOSIS — F341 Dysthymic disorder: Secondary | ICD-10-CM | POA: Diagnosis not present

## 2018-12-07 DIAGNOSIS — F4321 Adjustment disorder with depressed mood: Secondary | ICD-10-CM | POA: Diagnosis not present

## 2018-12-09 ENCOUNTER — Ambulatory Visit (INDEPENDENT_AMBULATORY_CARE_PROVIDER_SITE_OTHER): Payer: 59 | Admitting: Psychology

## 2018-12-09 DIAGNOSIS — F4321 Adjustment disorder with depressed mood: Secondary | ICD-10-CM | POA: Diagnosis not present

## 2018-12-09 DIAGNOSIS — F341 Dysthymic disorder: Secondary | ICD-10-CM | POA: Diagnosis not present

## 2018-12-13 ENCOUNTER — Ambulatory Visit (INDEPENDENT_AMBULATORY_CARE_PROVIDER_SITE_OTHER): Payer: 59 | Admitting: Psychology

## 2018-12-13 DIAGNOSIS — F341 Dysthymic disorder: Secondary | ICD-10-CM | POA: Diagnosis not present

## 2018-12-13 DIAGNOSIS — F4321 Adjustment disorder with depressed mood: Secondary | ICD-10-CM

## 2018-12-16 ENCOUNTER — Ambulatory Visit (INDEPENDENT_AMBULATORY_CARE_PROVIDER_SITE_OTHER): Payer: 59 | Admitting: Psychology

## 2018-12-16 DIAGNOSIS — F341 Dysthymic disorder: Secondary | ICD-10-CM

## 2018-12-16 DIAGNOSIS — F4321 Adjustment disorder with depressed mood: Secondary | ICD-10-CM | POA: Diagnosis not present

## 2018-12-20 ENCOUNTER — Ambulatory Visit (INDEPENDENT_AMBULATORY_CARE_PROVIDER_SITE_OTHER): Payer: 59 | Admitting: Psychology

## 2018-12-20 DIAGNOSIS — F341 Dysthymic disorder: Secondary | ICD-10-CM | POA: Diagnosis not present

## 2018-12-20 DIAGNOSIS — F4321 Adjustment disorder with depressed mood: Secondary | ICD-10-CM | POA: Diagnosis not present

## 2018-12-22 ENCOUNTER — Other Ambulatory Visit: Payer: Self-pay | Admitting: Family Medicine

## 2018-12-22 DIAGNOSIS — K219 Gastro-esophageal reflux disease without esophagitis: Secondary | ICD-10-CM

## 2018-12-22 DIAGNOSIS — I10 Essential (primary) hypertension: Secondary | ICD-10-CM

## 2018-12-23 ENCOUNTER — Ambulatory Visit (INDEPENDENT_AMBULATORY_CARE_PROVIDER_SITE_OTHER): Payer: 59 | Admitting: Psychology

## 2018-12-23 DIAGNOSIS — F4321 Adjustment disorder with depressed mood: Secondary | ICD-10-CM | POA: Diagnosis not present

## 2018-12-23 DIAGNOSIS — F341 Dysthymic disorder: Secondary | ICD-10-CM

## 2018-12-27 ENCOUNTER — Ambulatory Visit (INDEPENDENT_AMBULATORY_CARE_PROVIDER_SITE_OTHER): Payer: 59 | Admitting: Psychology

## 2018-12-27 DIAGNOSIS — F4321 Adjustment disorder with depressed mood: Secondary | ICD-10-CM

## 2018-12-27 DIAGNOSIS — F341 Dysthymic disorder: Secondary | ICD-10-CM | POA: Diagnosis not present

## 2018-12-29 ENCOUNTER — Ambulatory Visit (INDEPENDENT_AMBULATORY_CARE_PROVIDER_SITE_OTHER): Payer: 59 | Admitting: Psychology

## 2018-12-29 DIAGNOSIS — F4321 Adjustment disorder with depressed mood: Secondary | ICD-10-CM | POA: Diagnosis not present

## 2018-12-29 DIAGNOSIS — F341 Dysthymic disorder: Secondary | ICD-10-CM

## 2019-01-03 ENCOUNTER — Ambulatory Visit: Payer: 59 | Admitting: Psychology

## 2019-01-04 ENCOUNTER — Ambulatory Visit (INDEPENDENT_AMBULATORY_CARE_PROVIDER_SITE_OTHER): Payer: 59 | Admitting: Psychology

## 2019-01-04 DIAGNOSIS — F341 Dysthymic disorder: Secondary | ICD-10-CM | POA: Diagnosis not present

## 2019-01-04 DIAGNOSIS — F4321 Adjustment disorder with depressed mood: Secondary | ICD-10-CM | POA: Diagnosis not present

## 2019-01-05 ENCOUNTER — Ambulatory Visit: Payer: 59 | Admitting: Psychology

## 2019-01-09 ENCOUNTER — Ambulatory Visit (INDEPENDENT_AMBULATORY_CARE_PROVIDER_SITE_OTHER): Payer: 59 | Admitting: Psychology

## 2019-01-09 DIAGNOSIS — F341 Dysthymic disorder: Secondary | ICD-10-CM | POA: Diagnosis not present

## 2019-01-09 DIAGNOSIS — F4321 Adjustment disorder with depressed mood: Secondary | ICD-10-CM

## 2019-01-11 ENCOUNTER — Telehealth: Payer: Self-pay

## 2019-01-11 ENCOUNTER — Ambulatory Visit (INDEPENDENT_AMBULATORY_CARE_PROVIDER_SITE_OTHER): Payer: 59 | Admitting: Psychology

## 2019-01-11 DIAGNOSIS — Z1239 Encounter for other screening for malignant neoplasm of breast: Secondary | ICD-10-CM

## 2019-01-11 DIAGNOSIS — Z01419 Encounter for gynecological examination (general) (routine) without abnormal findings: Secondary | ICD-10-CM

## 2019-01-11 DIAGNOSIS — N898 Other specified noninflammatory disorders of vagina: Secondary | ICD-10-CM

## 2019-01-11 DIAGNOSIS — F341 Dysthymic disorder: Secondary | ICD-10-CM | POA: Diagnosis not present

## 2019-01-11 DIAGNOSIS — F4321 Adjustment disorder with depressed mood: Secondary | ICD-10-CM | POA: Diagnosis not present

## 2019-01-11 NOTE — Telephone Encounter (Signed)
I don't see what this encounter is addressing.

## 2019-01-11 NOTE — Telephone Encounter (Signed)
No notes requierd

## 2019-01-12 NOTE — Telephone Encounter (Signed)
OK to order mammogram. She does not need referral. Here is message I give pts for gyn across hall:   Call Center for [facility] at [phone] for an appointment.  They are located at [address], [place], [place], [address] (right across the hall from our office).

## 2019-01-12 NOTE — Addendum Note (Signed)
Addended by: Kelle Darting A on: 01/12/2019 04:58 PM   Modules accepted: Orders

## 2019-01-12 NOTE — Telephone Encounter (Signed)
Spoke with pt. She has no new breast concerns, just needs screening unilateral, right mammogram and usually has at the [facility]. Referral placed and # given to pt to call and schedule appt. Pt has history of hysterectomy but states she would like an annual gyn exam and is currently having vaginal itching and odor. Has recently learned that spouse has been unfaithful and did not feel comfortable having PCP address these symptoms. Advised pt I have placed gyn referral but I am unsure how soon they will be able to evaluate her. Advised her to let us know if they cannot see her soon and we will need to have PCP address this and she voices understanding.

## 2019-01-12 NOTE — Telephone Encounter (Signed)
Patient will like to get a referral for mammogram and to see a gynecologist.

## 2019-01-13 ENCOUNTER — Ambulatory Visit (INDEPENDENT_AMBULATORY_CARE_PROVIDER_SITE_OTHER): Payer: 59 | Admitting: Psychology

## 2019-01-13 DIAGNOSIS — F341 Dysthymic disorder: Secondary | ICD-10-CM | POA: Diagnosis not present

## 2019-01-13 DIAGNOSIS — F4321 Adjustment disorder with depressed mood: Secondary | ICD-10-CM

## 2019-01-17 ENCOUNTER — Ambulatory Visit (INDEPENDENT_AMBULATORY_CARE_PROVIDER_SITE_OTHER): Payer: 59 | Admitting: Psychology

## 2019-01-17 DIAGNOSIS — F341 Dysthymic disorder: Secondary | ICD-10-CM | POA: Diagnosis not present

## 2019-01-17 DIAGNOSIS — F4321 Adjustment disorder with depressed mood: Secondary | ICD-10-CM

## 2019-01-18 ENCOUNTER — Ambulatory Visit (INDEPENDENT_AMBULATORY_CARE_PROVIDER_SITE_OTHER): Payer: 59 | Admitting: Psychology

## 2019-01-18 DIAGNOSIS — F341 Dysthymic disorder: Secondary | ICD-10-CM

## 2019-01-18 DIAGNOSIS — F4321 Adjustment disorder with depressed mood: Secondary | ICD-10-CM | POA: Diagnosis not present

## 2019-01-19 ENCOUNTER — Ambulatory Visit (INDEPENDENT_AMBULATORY_CARE_PROVIDER_SITE_OTHER): Payer: Managed Care, Other (non HMO) | Admitting: Family Medicine

## 2019-01-19 ENCOUNTER — Other Ambulatory Visit: Payer: Self-pay

## 2019-01-19 ENCOUNTER — Encounter: Payer: Managed Care, Other (non HMO) | Admitting: Family Medicine

## 2019-01-19 ENCOUNTER — Encounter: Payer: Self-pay | Admitting: Family Medicine

## 2019-01-19 VITALS — BP 120/65 | HR 99 | Ht 67.0 in | Wt 223.1 lb

## 2019-01-19 DIAGNOSIS — B9689 Other specified bacterial agents as the cause of diseases classified elsewhere: Secondary | ICD-10-CM

## 2019-01-19 DIAGNOSIS — R399 Unspecified symptoms and signs involving the genitourinary system: Secondary | ICD-10-CM | POA: Diagnosis not present

## 2019-01-19 DIAGNOSIS — N760 Acute vaginitis: Secondary | ICD-10-CM

## 2019-01-19 DIAGNOSIS — Z113 Encounter for screening for infections with a predominantly sexual mode of transmission: Secondary | ICD-10-CM

## 2019-01-19 DIAGNOSIS — N898 Other specified noninflammatory disorders of vagina: Secondary | ICD-10-CM

## 2019-01-19 LAB — POCT URINALYSIS DIPSTICK
Blood, UA: NEGATIVE
Glucose, UA: NEGATIVE
Leukocytes, UA: NEGATIVE
Nitrite, UA: NEGATIVE
Protein, UA: NEGATIVE
Spec Grav, UA: 1.02 (ref 1.010–1.025)
pH, UA: 6 (ref 5.0–8.0)

## 2019-01-19 NOTE — Progress Notes (Signed)
Pt states that she has been experiencing vaginal itching x 2 months.

## 2019-01-19 NOTE — Progress Notes (Addendum)
   Subjective:    Patient ID: Tonya [name], female    DOB: [birth year], 68 y.o.   MRN: [id number]  HPI  Patient seen for vaginal irritation that started a couple months ago. Improved some, but not resolved after having one dose of diflucan. Having itching, increased vaginal discharge, some odor. Concerned about STDs as husband had extramarrital affair.  History of total hysterectomy. Never had abnormal PAP.   I have reviewed the patients past medical, family, and social history.  I have reviewed the patient's medication list and allergies.   Review of Systems     Objective:   Physical Exam Vitals signs reviewed. Exam conducted with a chaperone present.  HENT:     Head: Normocephalic and atraumatic.  Cardiovascular:     Rate and Rhythm: Normal rate.     Pulses: Normal pulses.  Pulmonary:     Effort: Pulmonary effort is normal.  Abdominal:     General: Abdomen is flat. Bowel sounds are normal. There is no distension.     Palpations: Abdomen is soft. There is no mass.     Tenderness: There is no abdominal tenderness. There is no guarding or rebound.     Hernia: No hernia is present. There is no hernia in the left inguinal area or right inguinal area.  Genitourinary:    Labia:        Right: No rash, tenderness, lesion or injury.        Left: No rash, tenderness, lesion or injury.      Urethra: No prolapse, urethral pain, urethral swelling or urethral lesion.     Vagina: No signs of injury and foreign body. Vaginal discharge (mild white discharge) present. No erythema, tenderness or bleeding.     Comments: Cervix and uterus surgically absent. No atrophy present. Lymphadenopathy:     Lower Body: No right inguinal adenopathy. No left inguinal adenopathy.  Skin:    Capillary Refill: Capillary refill takes less than 2 seconds.  Neurological:     General: No focal deficit present.     Mental Status: She is alert.  Psychiatric:        Mood and Affect: Mood normal.        Behavior:  Behavior normal.        Thought Content: Thought content normal.        Judgment: Judgment normal.        Assessment & Plan:  1. Vaginal discharge Will give diflucan. Wet prep and GC/CT.  - Cervicovaginal ancillary only( [place])  2. UTI symptoms UA normal - POCT urinalysis dipstick  3. Routine screening for STI (sexually transmitted infection) - HIV Antibody (routine testing w rflx) - Hepatitis C Antibody - Hepatitis B Surface AntiGEN - RPR

## 2019-01-20 ENCOUNTER — Ambulatory Visit (INDEPENDENT_AMBULATORY_CARE_PROVIDER_SITE_OTHER): Payer: 59 | Admitting: Psychology

## 2019-01-20 DIAGNOSIS — F341 Dysthymic disorder: Secondary | ICD-10-CM | POA: Diagnosis not present

## 2019-01-20 DIAGNOSIS — F4321 Adjustment disorder with depressed mood: Secondary | ICD-10-CM

## 2019-01-20 LAB — HEPATITIS B SURFACE ANTIGEN: Hepatitis B Surface Ag: NEGATIVE

## 2019-01-20 LAB — HIV ANTIBODY (ROUTINE TESTING W REFLEX): HIV Screen 4th Generation wRfx: NONREACTIVE

## 2019-01-20 LAB — RPR: RPR Ser Ql: NONREACTIVE

## 2019-01-20 LAB — HEPATITIS C ANTIBODY: Hep C Virus Ab: 0.1 s/co ratio (ref 0.0–0.9)

## 2019-01-23 ENCOUNTER — Other Ambulatory Visit: Payer: Self-pay

## 2019-01-23 DIAGNOSIS — B379 Candidiasis, unspecified: Secondary | ICD-10-CM

## 2019-01-23 MED ORDER — FLUCONAZOLE 150 MG PO TABS: 150.0000 mg | ORAL_TABLET | Freq: Once | ORAL | 0 refills | Status: AC

## 2019-01-24 ENCOUNTER — Ambulatory Visit (INDEPENDENT_AMBULATORY_CARE_PROVIDER_SITE_OTHER): Payer: 59 | Admitting: Psychology

## 2019-01-24 DIAGNOSIS — F4321 Adjustment disorder with depressed mood: Secondary | ICD-10-CM

## 2019-01-24 DIAGNOSIS — F341 Dysthymic disorder: Secondary | ICD-10-CM | POA: Diagnosis not present

## 2019-01-27 ENCOUNTER — Telehealth: Payer: Self-pay

## 2019-01-27 ENCOUNTER — Ambulatory Visit (INDEPENDENT_AMBULATORY_CARE_PROVIDER_SITE_OTHER): Payer: 59 | Admitting: Psychology

## 2019-01-27 DIAGNOSIS — F4321 Adjustment disorder with depressed mood: Secondary | ICD-10-CM | POA: Diagnosis not present

## 2019-01-27 DIAGNOSIS — F341 Dysthymic disorder: Secondary | ICD-10-CM

## 2019-01-27 NOTE — Telephone Encounter (Signed)
Pt called the office requesting results. Pt made aware that results are taking a little longer because the lab is having some issues. Pt made aware that she will receive a call once her labs have resulted. Understanding was voiced. Wyatt Galvan l Jayten Gabbard, CMA

## 2019-01-30 ENCOUNTER — Ambulatory Visit (INDEPENDENT_AMBULATORY_CARE_PROVIDER_SITE_OTHER): Payer: 59 | Admitting: Psychology

## 2019-01-30 DIAGNOSIS — F4321 Adjustment disorder with depressed mood: Secondary | ICD-10-CM

## 2019-01-30 DIAGNOSIS — F341 Dysthymic disorder: Secondary | ICD-10-CM | POA: Diagnosis not present

## 2019-01-31 LAB — CERVICOVAGINAL ANCILLARY ONLY
Candida vaginitis: NEGATIVE
Chlamydia: NEGATIVE
Neisseria Gonorrhea: NEGATIVE

## 2019-02-01 ENCOUNTER — Telehealth: Payer: Self-pay

## 2019-02-01 MED ORDER — METRONIDAZOLE 500 MG PO TABS: 500.0000 mg | ORAL_TABLET | Freq: Two times a day (BID) | ORAL | 0 refills | Status: DC

## 2019-02-01 NOTE — Telephone Encounter (Signed)
Called pt regarding positive BV results. Pt made aware that wet prep was positive for BV. Understanding was voiced.  chiquita l wilson, CMA

## 2019-02-01 NOTE — Telephone Encounter (Signed)
-----   Message from Truett Mainland, DO sent at 02/01/2019  7:32 AM EDT ----- Patient has BV. Flagyl sent to pharmacy. Please let pt know

## 2019-02-01 NOTE — Addendum Note (Signed)
Addended by: Truett Mainland on: 02/01/2019 07:32 AM   Modules accepted: Orders

## 2019-02-06 ENCOUNTER — Telehealth: Payer: Self-pay

## 2019-02-06 ENCOUNTER — Ambulatory Visit (INDEPENDENT_AMBULATORY_CARE_PROVIDER_SITE_OTHER): Payer: 59 | Admitting: Psychology

## 2019-02-06 DIAGNOSIS — F341 Dysthymic disorder: Secondary | ICD-10-CM

## 2019-02-06 DIAGNOSIS — F4321 Adjustment disorder with depressed mood: Secondary | ICD-10-CM

## 2019-02-06 NOTE — Telephone Encounter (Signed)
Patient called stating that she called in over the weekend because she woke up Sunday with headache, diarrhea, and nausea. Patient states she completed four days worth of the metrodinazole  (flagyl) for bacterial vaginosis. The nurse on call over the weekend call doc on call who recommended the patient stop the medication. Patient is worried that the BV will return. Patient also states that she has had low grade fever of 98.8-99 F and worried about COVID.   When speaking with patient I asked her if she had consumed any alcohol this weekend and she said she had some wine Saturday night. Instructed patient that consuming alcohol with Flagyl can make you sick like that.   Spoke with Dr. Nehemiah Settle and he agreed that it was most likely due to the alcohol consumption. Patient made aware that Dr. Nehemiah Settle recommended that she finish out the Flagyl and avoid alcohol.  Patient states understanding. Patient also made aware that Dr Nehemiah Settle does not recommend COVID testing at this point. Kathrene Alu RN

## 2019-02-08 ENCOUNTER — Ambulatory Visit (INDEPENDENT_AMBULATORY_CARE_PROVIDER_SITE_OTHER): Payer: 59 | Admitting: Psychology

## 2019-02-08 DIAGNOSIS — F341 Dysthymic disorder: Secondary | ICD-10-CM | POA: Diagnosis not present

## 2019-02-08 DIAGNOSIS — F4321 Adjustment disorder with depressed mood: Secondary | ICD-10-CM | POA: Diagnosis not present

## 2019-02-10 ENCOUNTER — Ambulatory Visit (INDEPENDENT_AMBULATORY_CARE_PROVIDER_SITE_OTHER): Payer: 59 | Admitting: Psychology

## 2019-02-10 DIAGNOSIS — F4323 Adjustment disorder with mixed anxiety and depressed mood: Secondary | ICD-10-CM | POA: Diagnosis not present

## 2019-02-13 ENCOUNTER — Ambulatory Visit (HOSPITAL_BASED_OUTPATIENT_CLINIC_OR_DEPARTMENT_OTHER)
Admission: RE | Admit: 2019-02-13 | Discharge: 2019-02-13 | Disposition: A | Discharge status: Home / Self Care | Payer: Managed Care, Other (non HMO) | Source: Ambulatory Visit | Attending: Family Medicine | Admitting: Family Medicine

## 2019-02-13 ENCOUNTER — Other Ambulatory Visit: Payer: Self-pay

## 2019-02-13 ENCOUNTER — Encounter (HOSPITAL_BASED_OUTPATIENT_CLINIC_OR_DEPARTMENT_OTHER): Payer: Self-pay

## 2019-02-13 DIAGNOSIS — Z1239 Encounter for other screening for malignant neoplasm of breast: Secondary | ICD-10-CM

## 2019-02-13 DIAGNOSIS — Z9012 Acquired absence of left breast and nipple: Secondary | ICD-10-CM | POA: Insufficient documentation

## 2019-02-13 DIAGNOSIS — Z1231 Encounter for screening mammogram for malignant neoplasm of breast: Secondary | ICD-10-CM | POA: Insufficient documentation

## 2019-02-13 DIAGNOSIS — Z853 Personal history of malignant neoplasm of breast: Secondary | ICD-10-CM | POA: Insufficient documentation

## 2019-02-13 IMAGING — MG DIGITAL SCREENING UNILATERAL RIGHT MAMMOGRAM WITH CAD AND TOMO
4 series · 4 of 12 positions shown · non-contrast
Comparison: Previous exam(s).

CLINICAL DATA: Screening.

EXAM:
DIGITAL SCREENING UNILATERAL RIGHT MAMMOGRAM WITH CAD AND TOMO

[R MLO synth-2D]
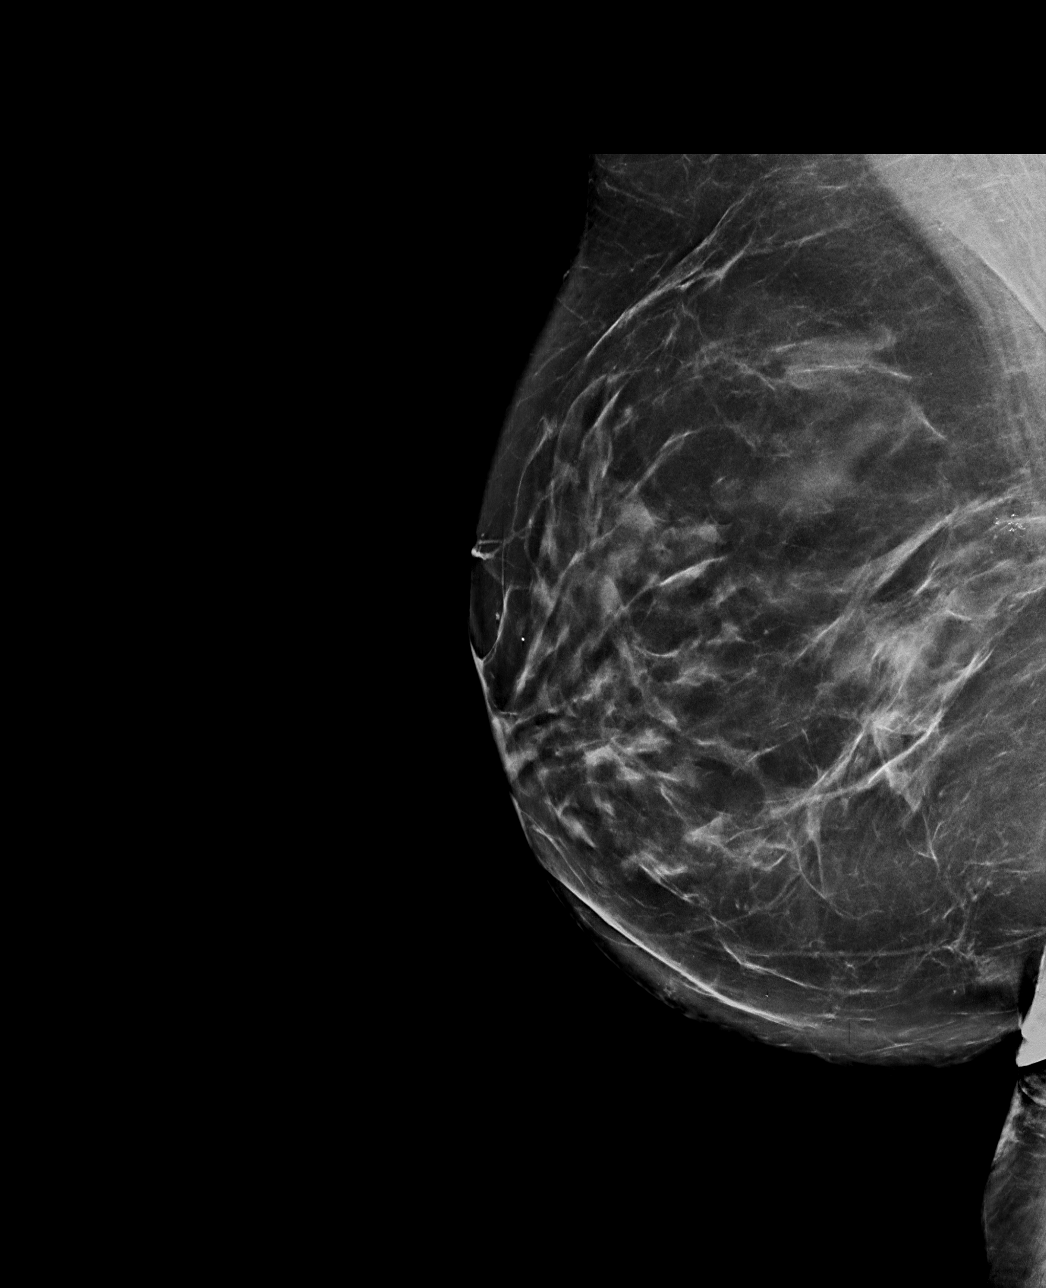

[R CC synth-2D]
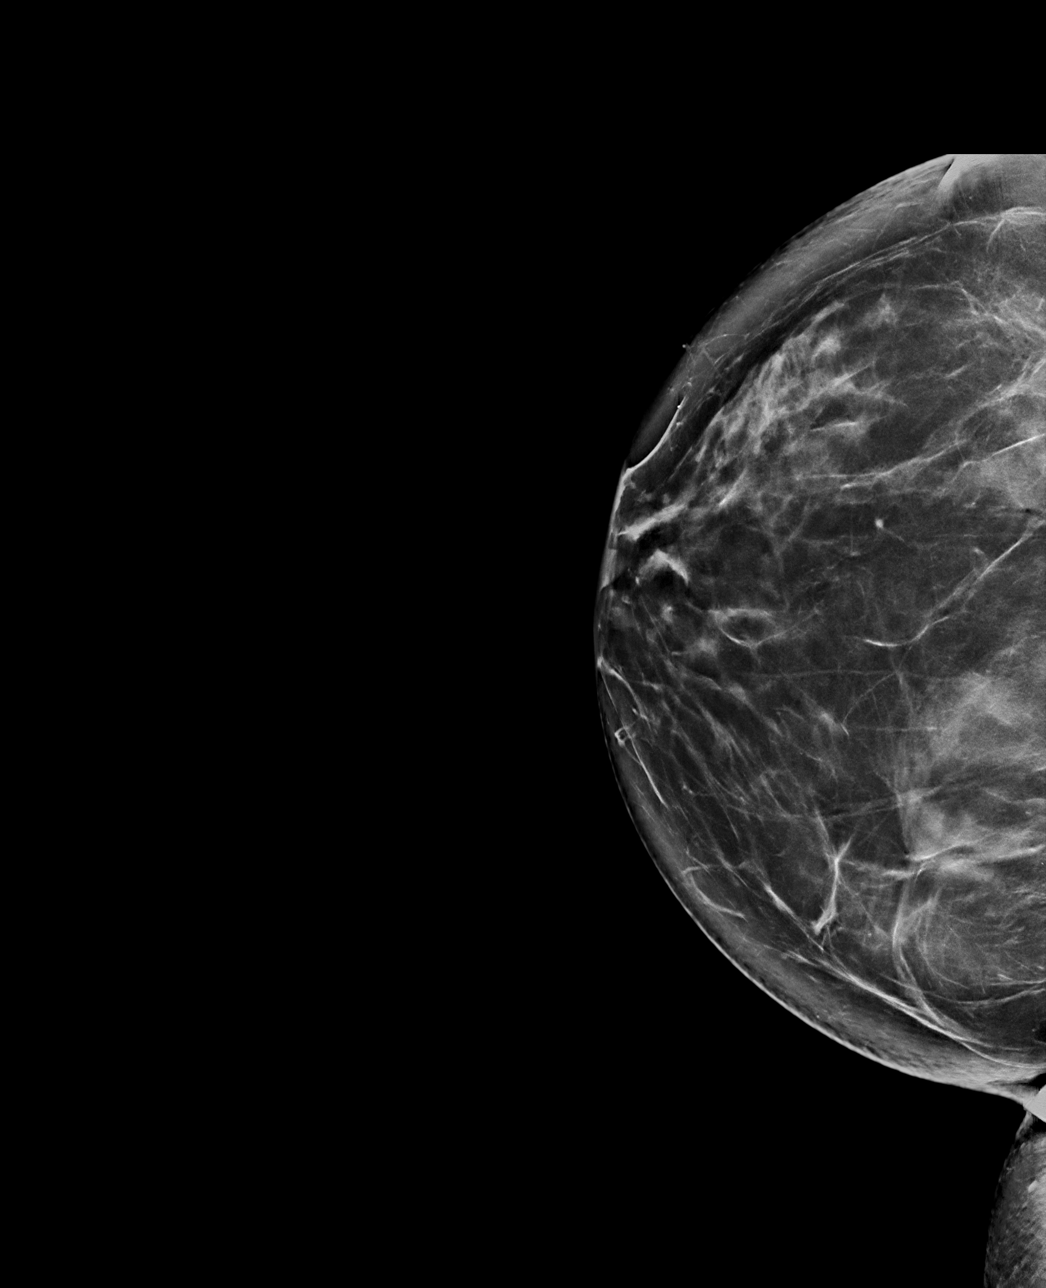

[R CC tomo · tomo slice 47/93.0]
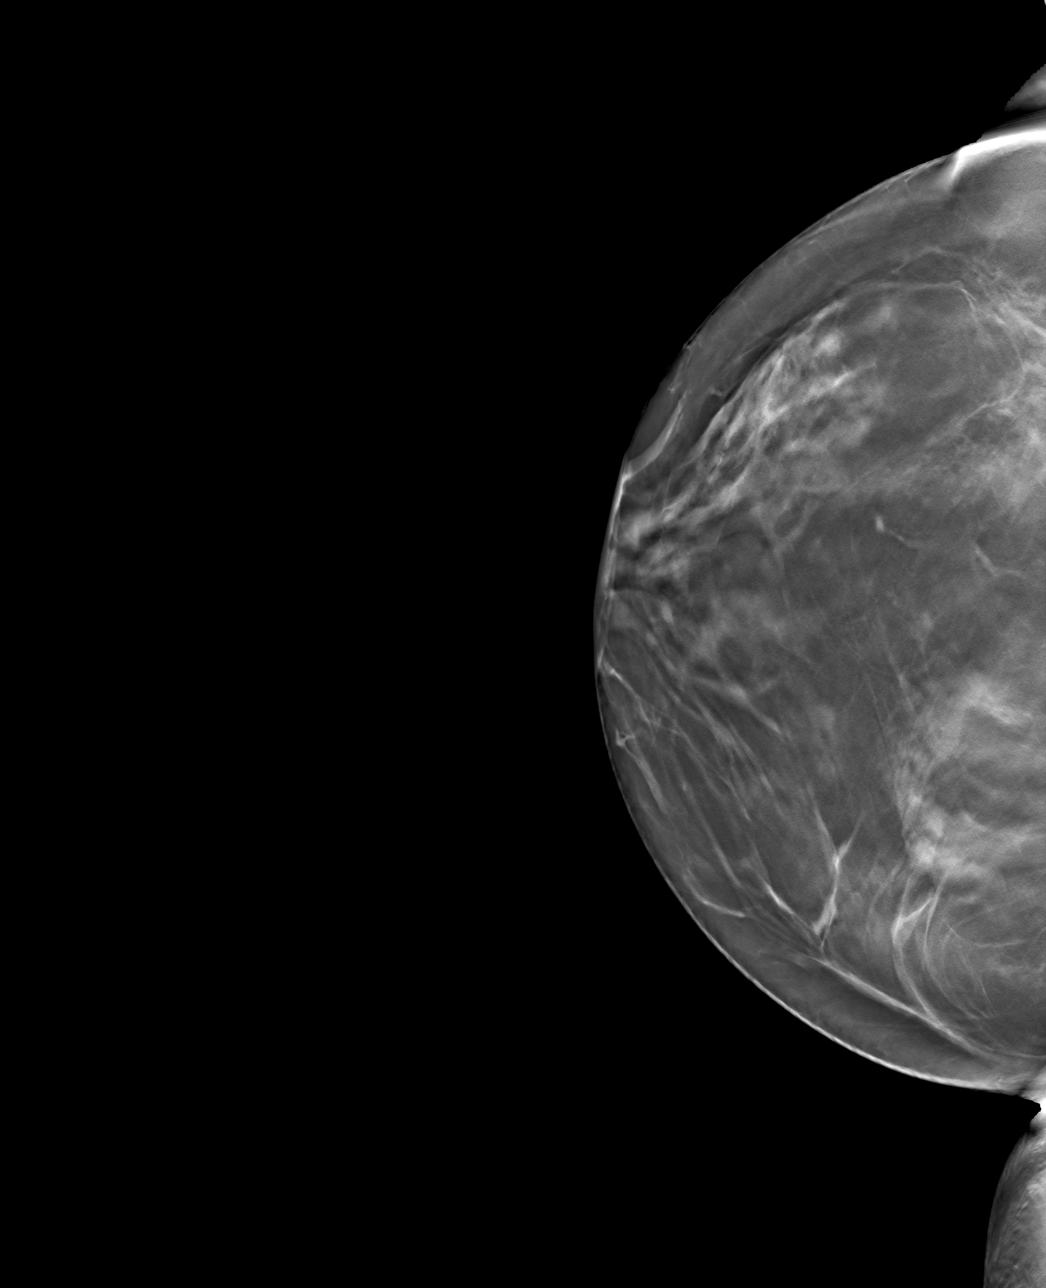

[R MLO tomo · tomo slice 49/97.0]
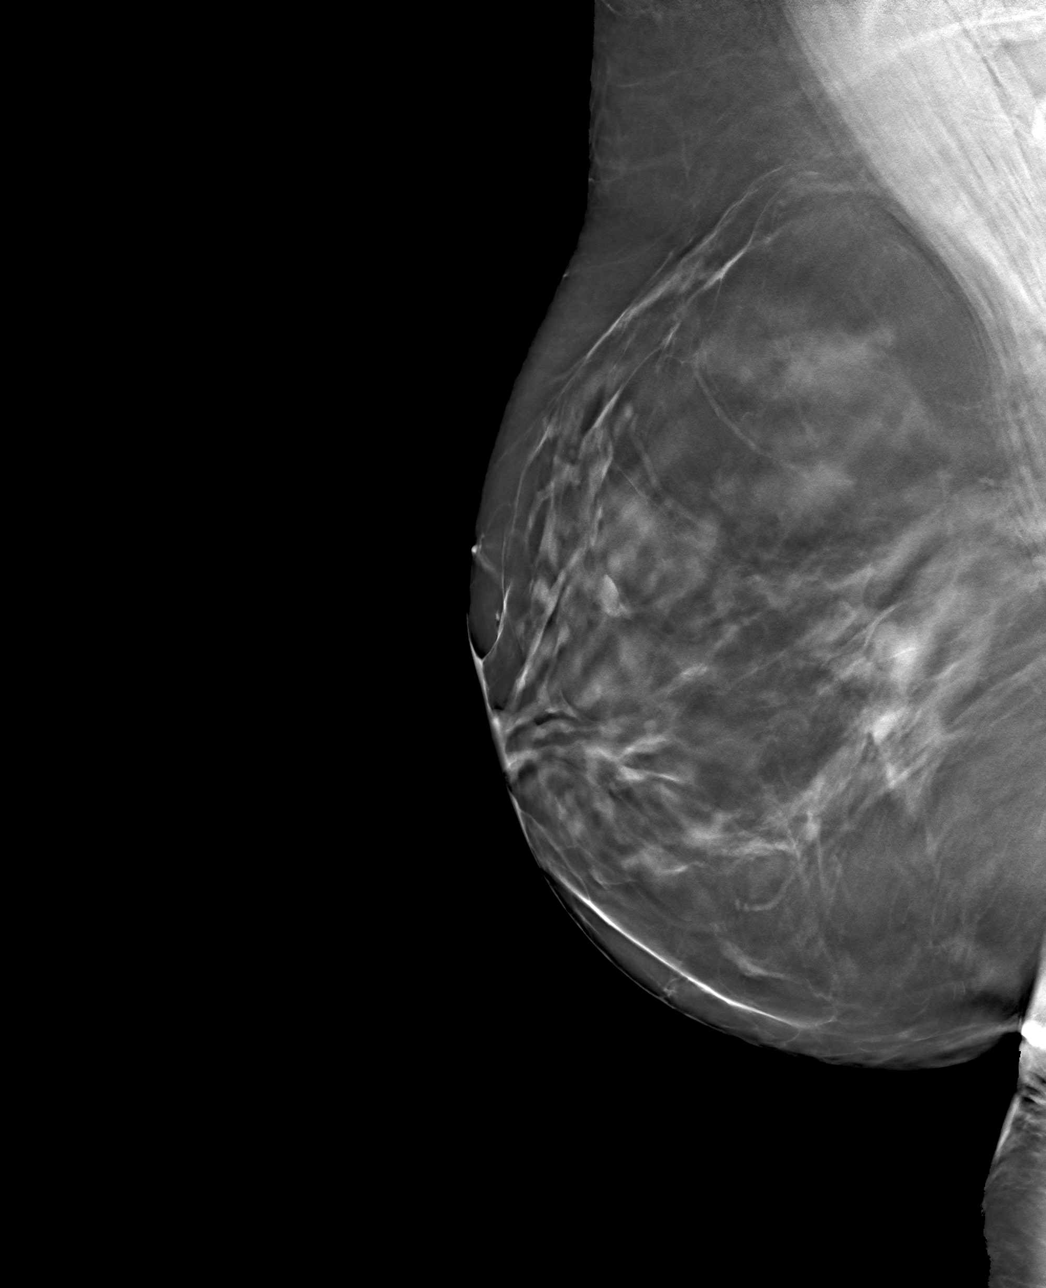

[4 of 12 positions shown; findings below may reference images not displayed]

ACR Breast Density Category c: The breast tissue is heterogeneously
dense, which may obscure small masses.
FINDINGS: In the right breast, calcifications warrant further evaluation with
magnified views. Images were processed with CAD.
IMPRESSION: Further evaluation is suggested for calcifications in the right
breast.

RECOMMENDATION:
Diagnostic mammogram of the right breast. (Code:[EM])

The patient will be contacted regarding the findings, and additional
imaging will be scheduled.

BI-RADS CATEGORY  0: Incomplete. Need additional imaging evaluation
and/or prior mammograms for comparison.

## 2019-02-15 ENCOUNTER — Telehealth: Payer: Self-pay

## 2019-02-15 ENCOUNTER — Ambulatory Visit: Payer: 59 | Admitting: Psychology

## 2019-02-15 ENCOUNTER — Other Ambulatory Visit: Payer: Self-pay | Admitting: Family Medicine

## 2019-02-15 ENCOUNTER — Other Ambulatory Visit: Payer: Self-pay

## 2019-02-15 ENCOUNTER — Ambulatory Visit
Admission: RE | Admit: 2019-02-15 | Discharge: 2019-02-15 | Disposition: A | Discharge status: Home / Self Care | Payer: Managed Care, Other (non HMO) | Source: Ambulatory Visit | Attending: Family Medicine | Admitting: Family Medicine

## 2019-02-15 DIAGNOSIS — R928 Other abnormal and inconclusive findings on diagnostic imaging of breast: Secondary | ICD-10-CM

## 2019-02-15 DIAGNOSIS — R921 Mammographic calcification found on diagnostic imaging of breast: Secondary | ICD-10-CM

## 2019-02-15 IMAGING — MG DIGITAL DIAGNOSTIC UNILATERAL RIGHT MAMMOGRAM
6 series · 6 of 6 positions shown · non-contrast
Comparison: Previous exam(s).

CLINICAL DATA: Screening recall for right breast calcifications.
The patient has a personal history of left breast carcinoma
initially treated with lumpectomy, subsequently treated with
mastectomy.

EXAM:
DIGITAL DIAGNOSTIC RIGHT MAMMOGRAM

[R XCCL (1 of 2)]
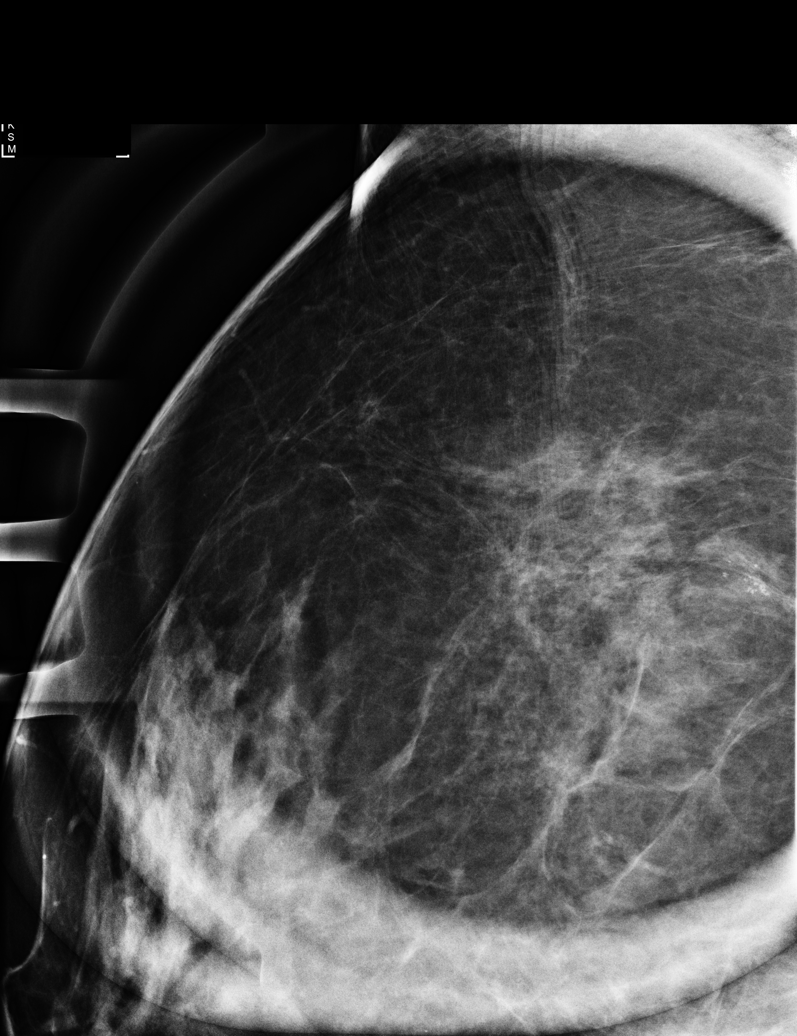

[R MLO (1 of 2)]
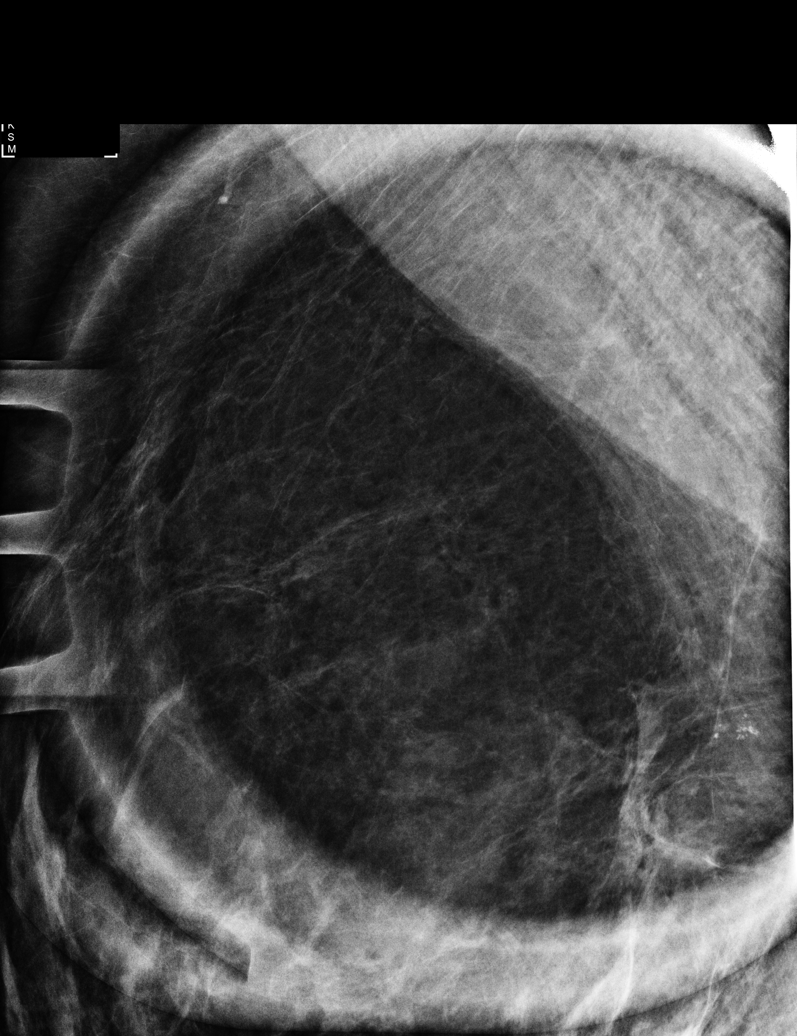

[R MLO (2 of 2)]
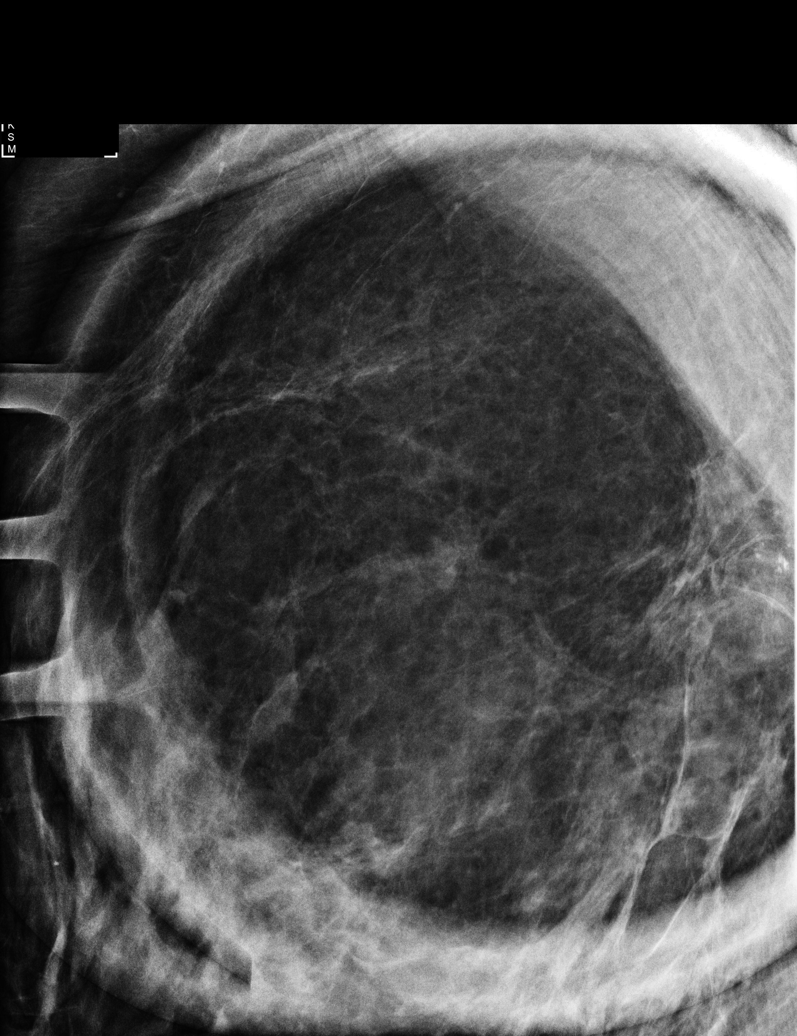

[R ML]
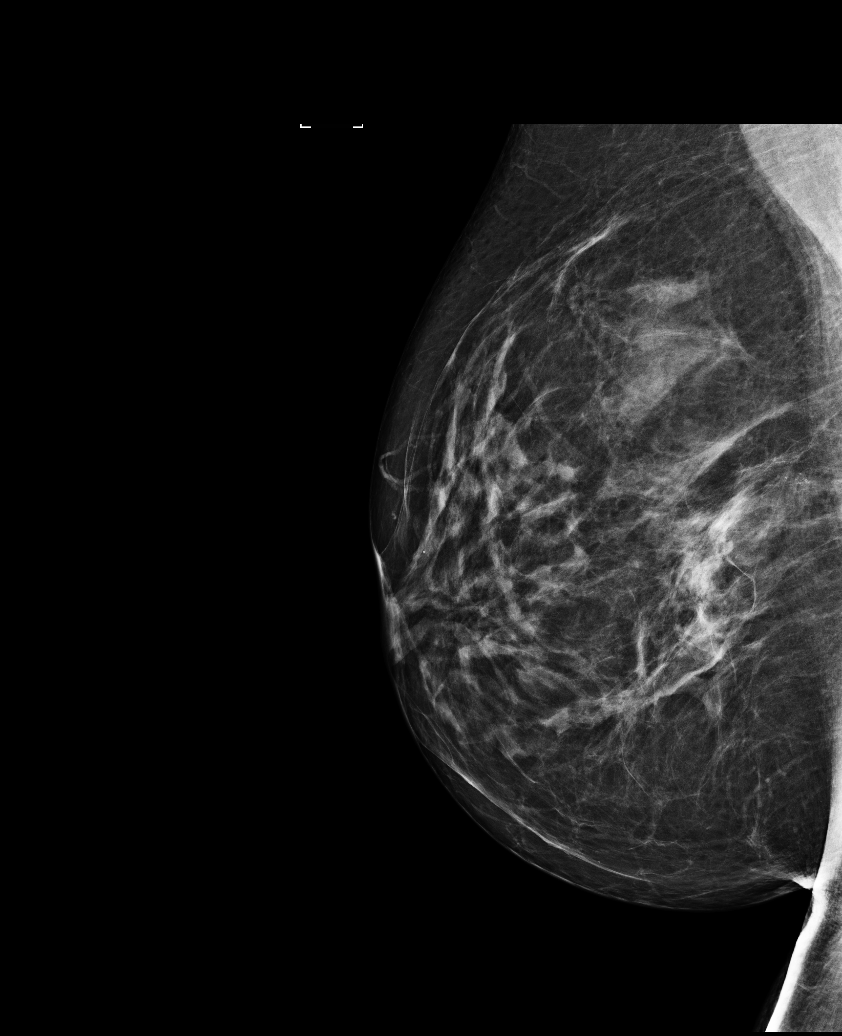

[R XCCL (2 of 2)]
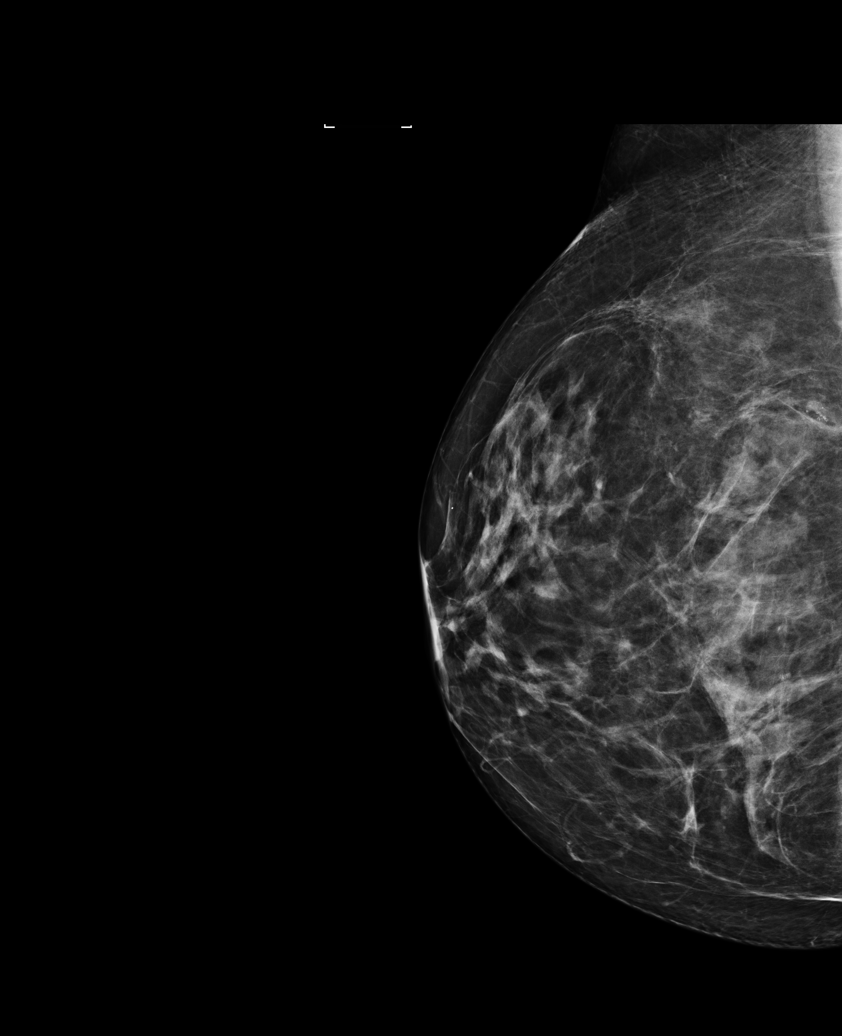

[R LM]
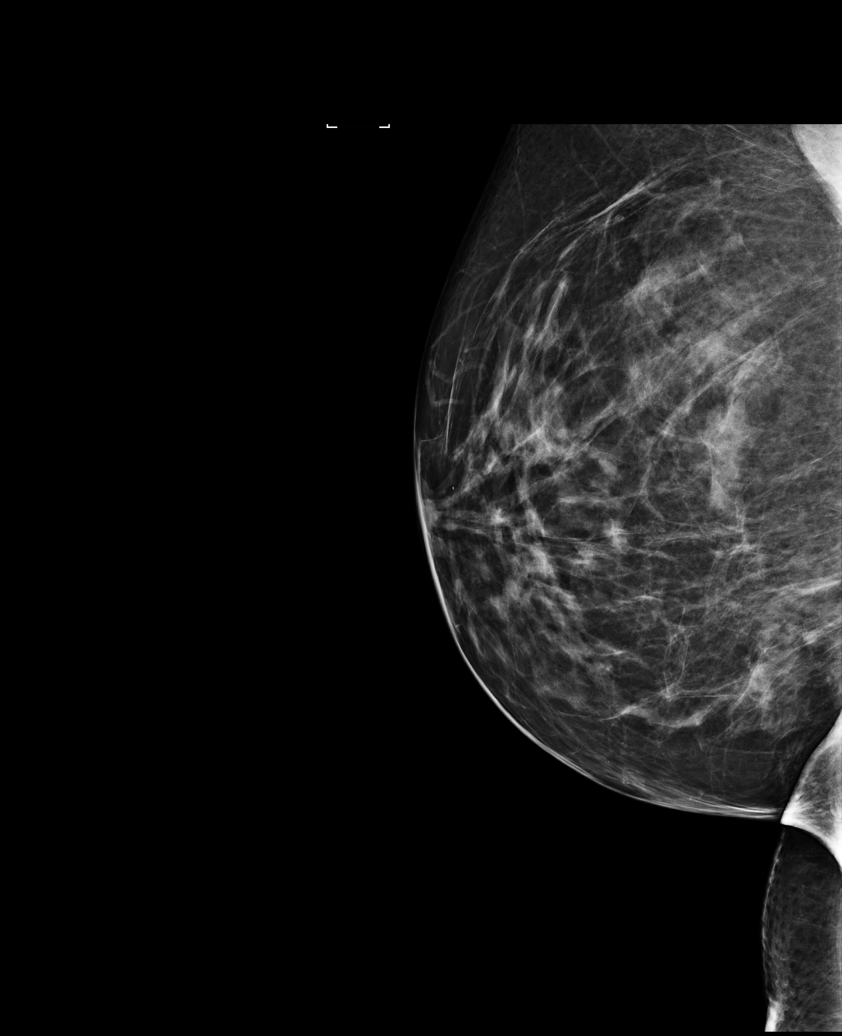

[6 of 6 positions shown; findings below may reference images not displayed]

ACR Breast Density Category c: The breast tissue is heterogeneously
dense, which may obscure small masses.
FINDINGS: There is an 8 mm group of coarse heterogeneous calcifications in the
posterior aspect of the right breast, projecting lateral to midline
on an exaggerated craniocaudal view. There is no definite associated
mass, no branching and no linearity.
IMPRESSION: Suspicious calcifications spanning 8 mm in the posterior, lateral
right breast. Biopsy is indicated.

RECOMMENDATION:
1. Patient has been scheduled for stereotactic core needle biopsy on
[DATE].

I have discussed the findings and recommendations with the patient.
Results were also provided in writing at the conclusion of the
visit. If applicable, a reminder letter will be sent to the patient
regarding the next appointment.

BI-RADS CATEGORY  4: Suspicious.

## 2019-02-17 ENCOUNTER — Ambulatory Visit (INDEPENDENT_AMBULATORY_CARE_PROVIDER_SITE_OTHER): Payer: 59 | Admitting: Psychology

## 2019-02-17 DIAGNOSIS — F4321 Adjustment disorder with depressed mood: Secondary | ICD-10-CM

## 2019-02-17 DIAGNOSIS — F341 Dysthymic disorder: Secondary | ICD-10-CM | POA: Diagnosis not present

## 2019-02-21 ENCOUNTER — Other Ambulatory Visit: Payer: Self-pay

## 2019-02-21 ENCOUNTER — Ambulatory Visit
Admission: RE | Admit: 2019-02-21 | Discharge: 2019-02-21 | Disposition: A | Discharge status: Home / Self Care | Payer: Managed Care, Other (non HMO) | Source: Ambulatory Visit | Attending: Family Medicine | Admitting: Family Medicine

## 2019-02-21 DIAGNOSIS — R921 Mammographic calcification found on diagnostic imaging of breast: Secondary | ICD-10-CM

## 2019-02-21 HISTORY — PX: BREAST BIOPSY: SHX20

## 2019-02-21 IMAGING — MG MM BREAST LOCALIZATION CLIP
4 series · 4 of 12 positions shown · non-contrast
Comparison: Previous exam(s).

CLINICAL DATA: Patient with indeterminate right breast
calcifications status post stereotactic guided core needle biopsy.

EXAM:
DIAGNOSTIC RIGHT MAMMOGRAM POST STEREOTACTIC BIOPSY

[R XCCL synth-2D]
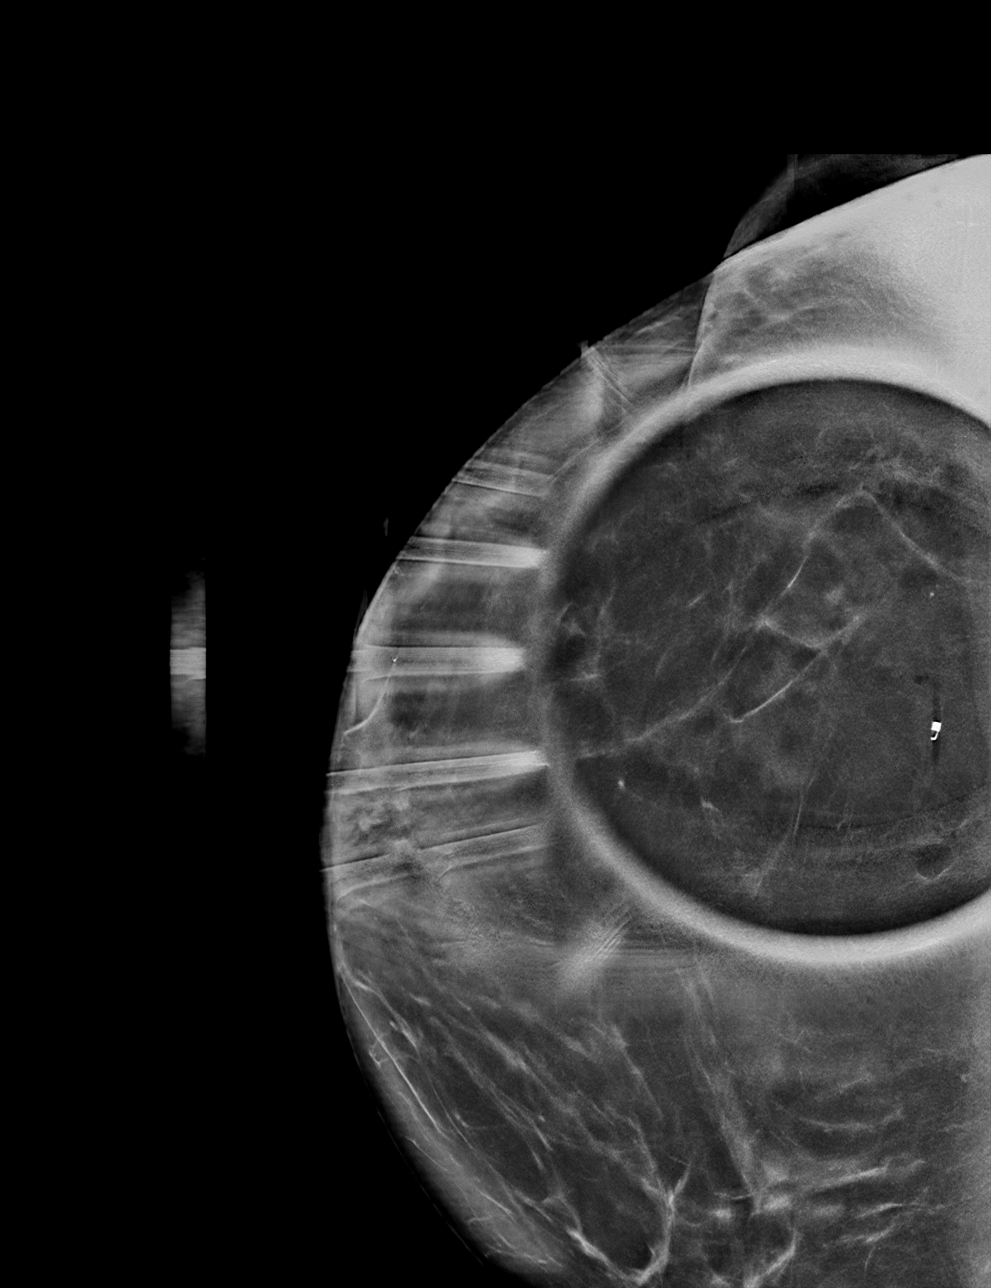

[R LM synth-2D]
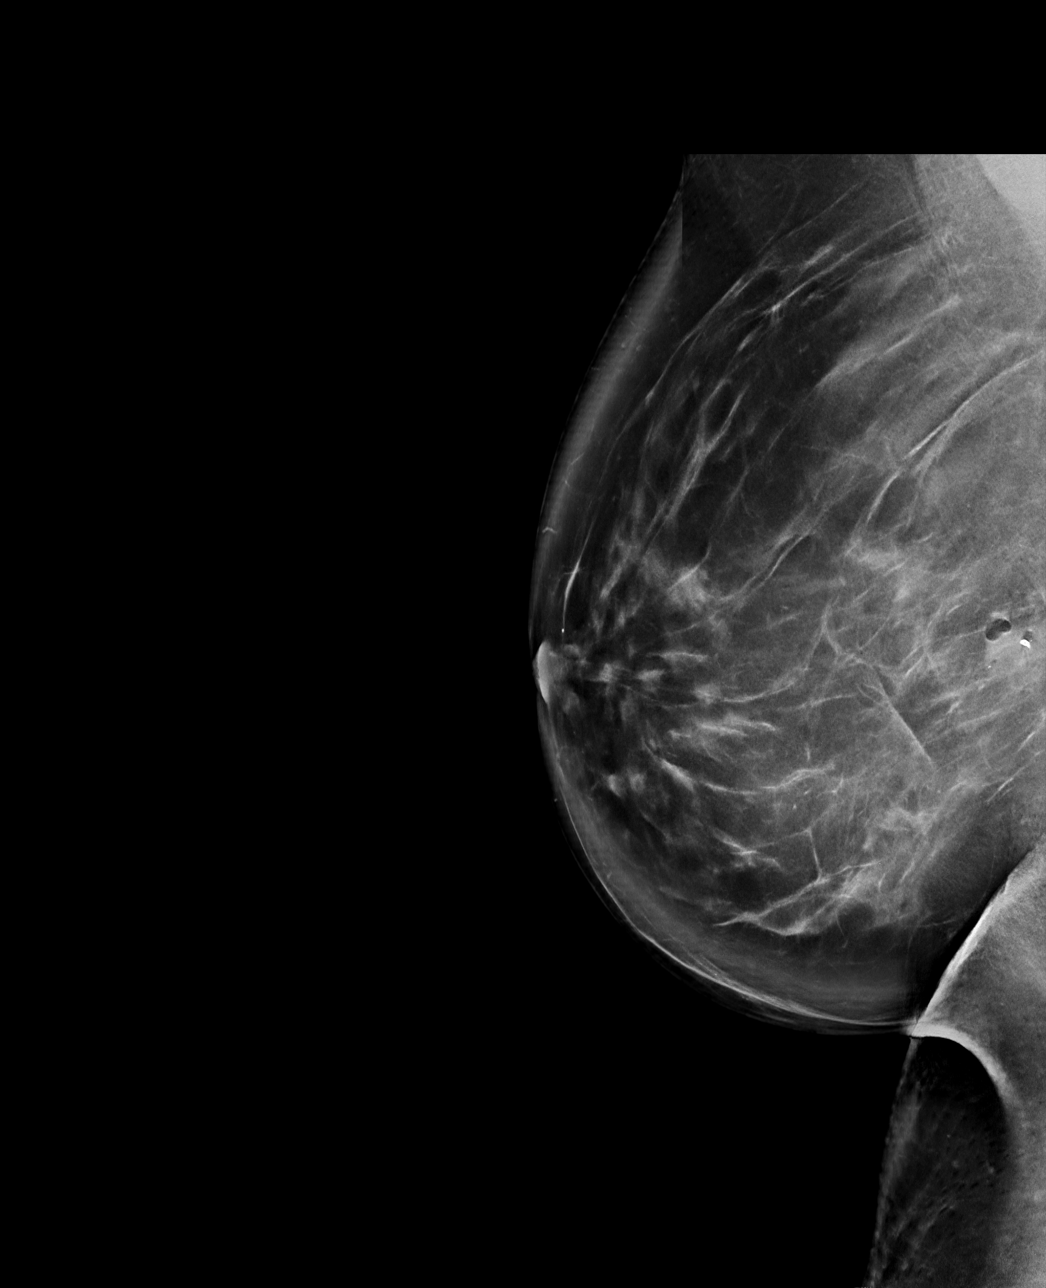

[R LM tomo · tomo slice 65/130.0]
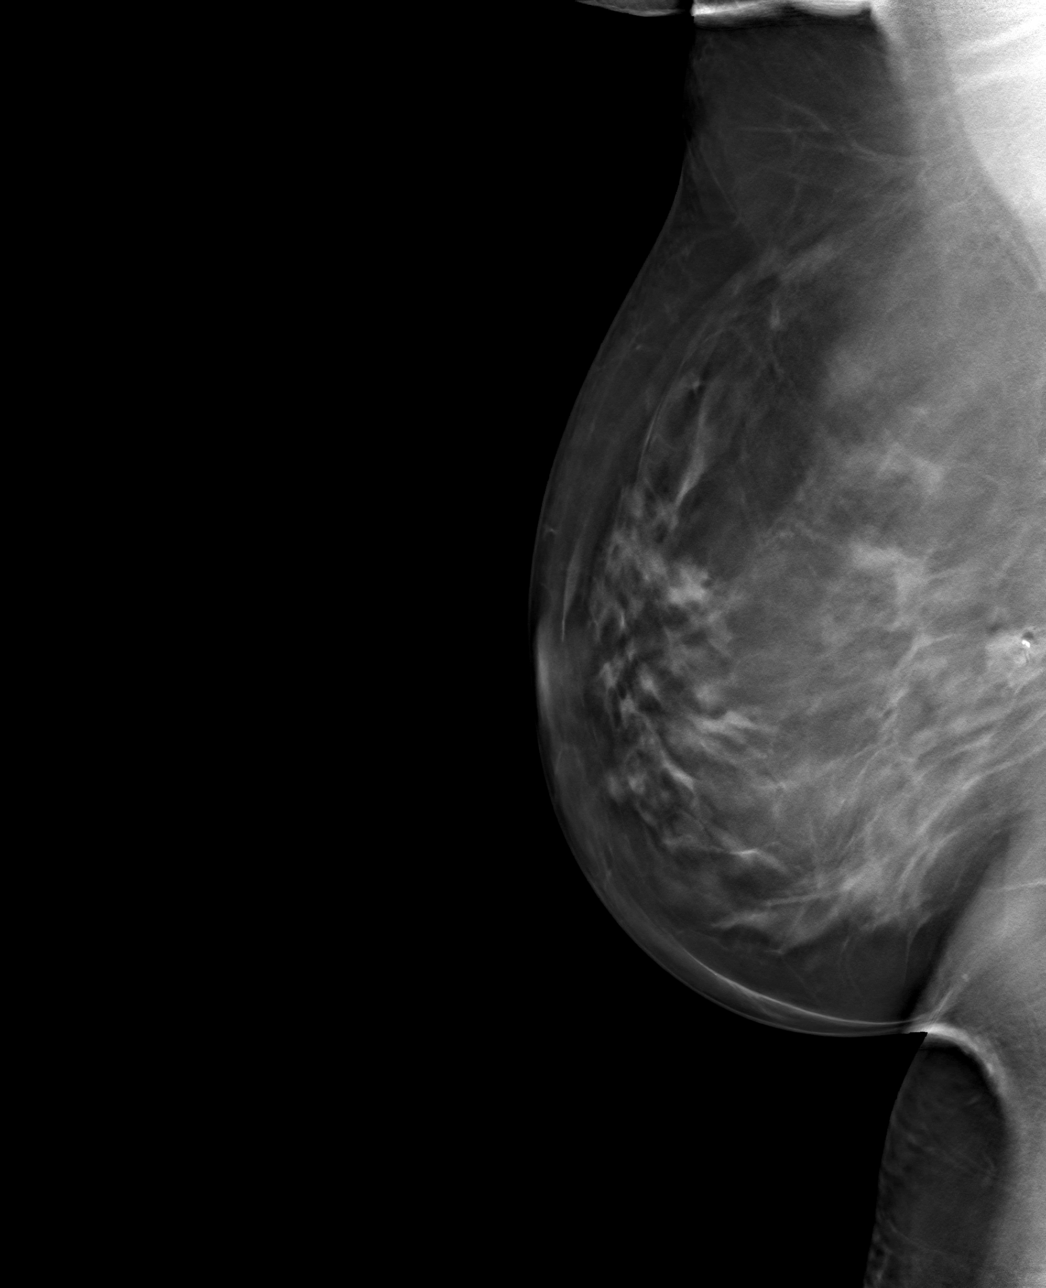

[R XCCL tomo · tomo slice 59/116.0]
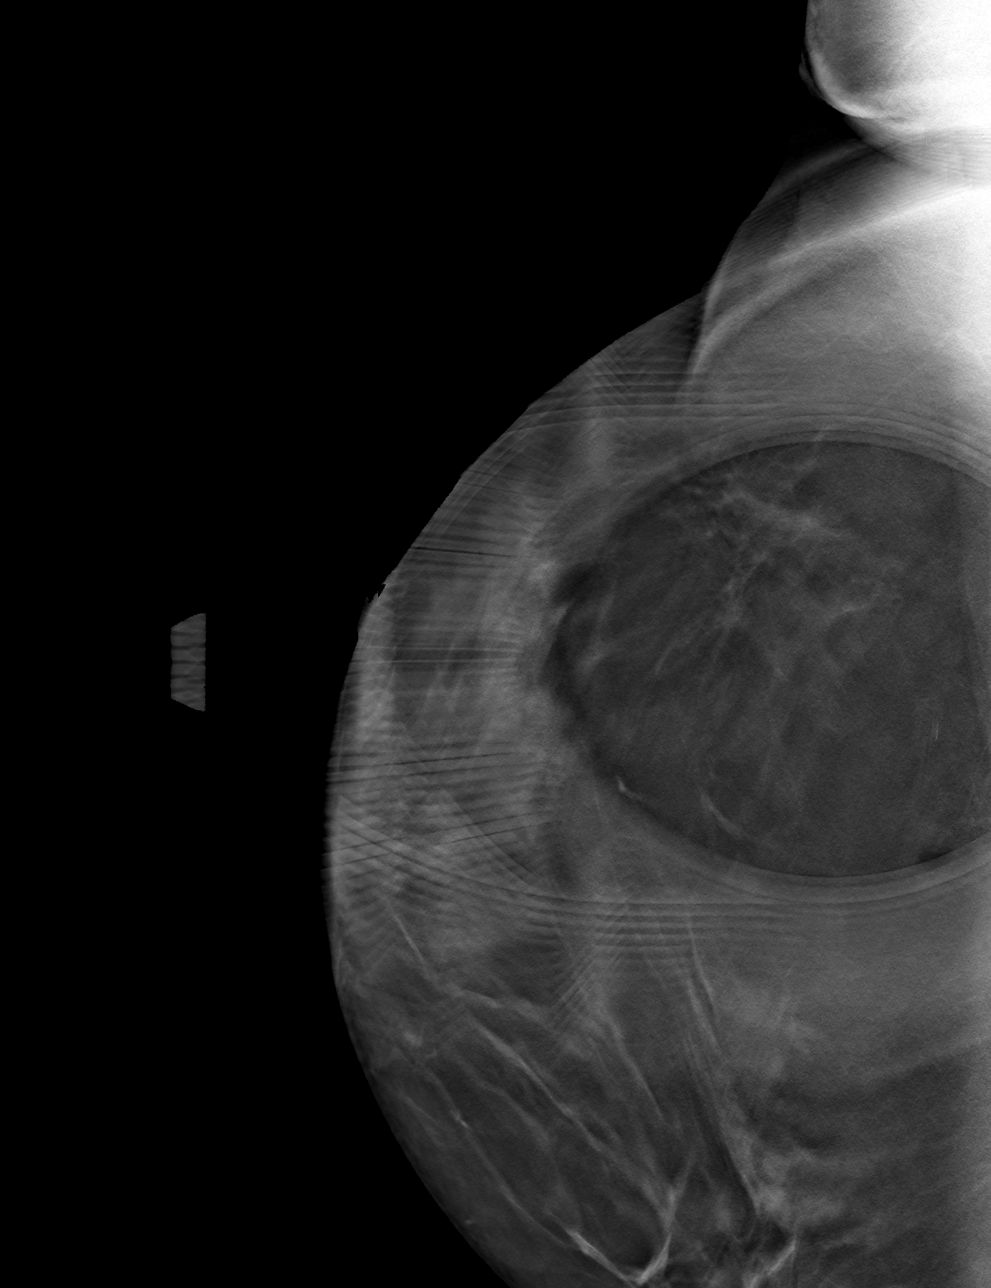

[4 of 12 positions shown; findings below may reference images not displayed]

FINDINGS: Mammographic images were obtained following stereotactic guided
biopsy of right breast calcifications. Coil shaped marking clip in
appropriate position within the outer right breast.
IMPRESSION: Appropriate position coil shaped marking clip status post
stereotactic guided biopsy right breast calcifications.

Final Assessment: Post Procedure Mammograms for Marker Placement

## 2019-02-21 IMAGING — MG MM BREAST BX W/ LOC DEV 1ST LESION IMAGE BX SPEC STEREO GUIDE*R*
8 of 13 series · 8 of 33 positions shown · non-contrast
Comparison: Previous exams.
COMPARISON: Previous exams.

Addendum:
CLINICAL DATA: Patient with indeterminate right breast
calcifications.

EXAM:
RIGHT BREAST STEREOTACTIC CORE NEEDLE BIOPSY

[R (1 of 5)]
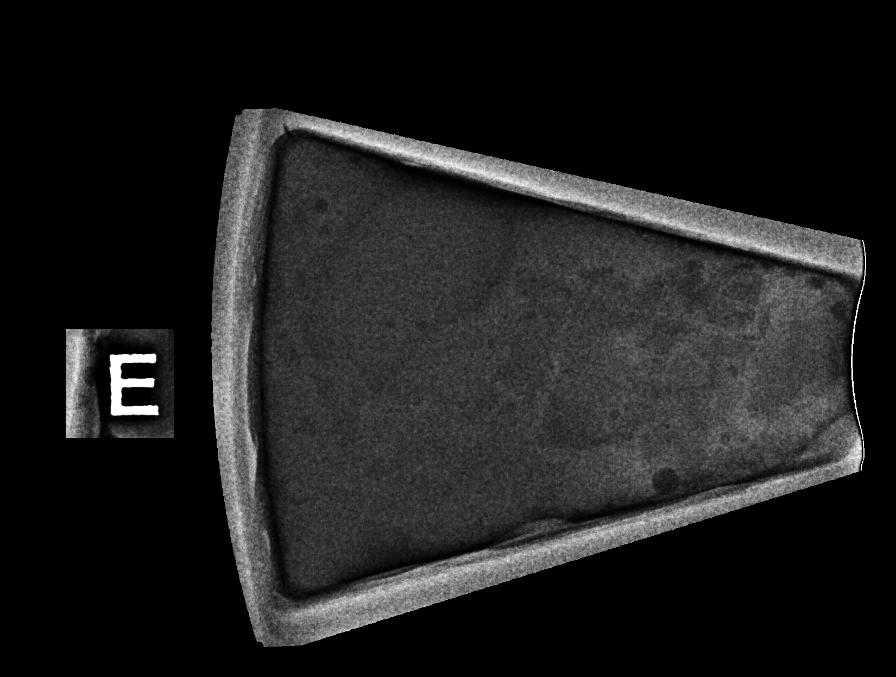

[R (2 of 5)]
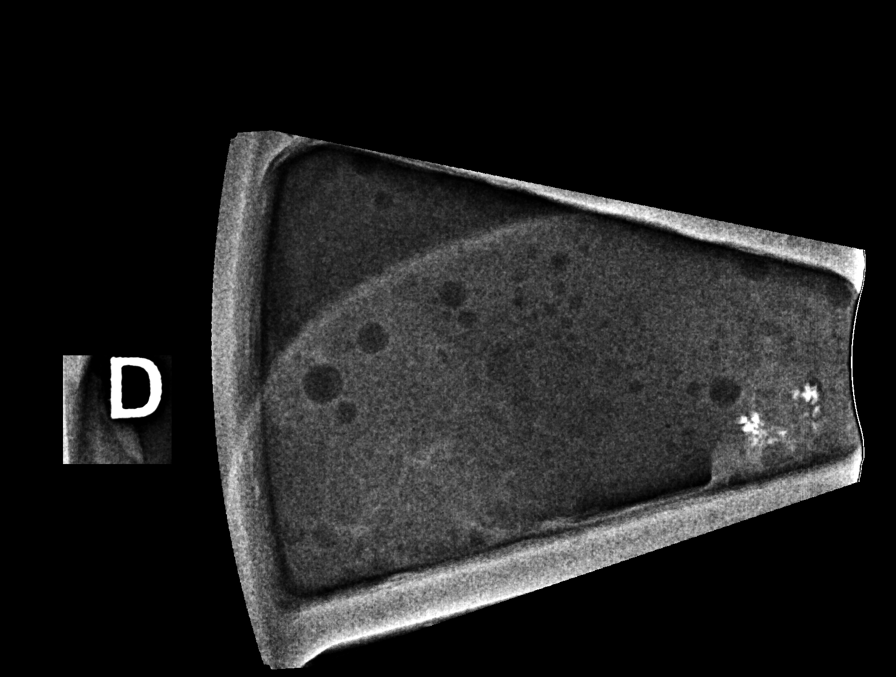

[R (3 of 5)]
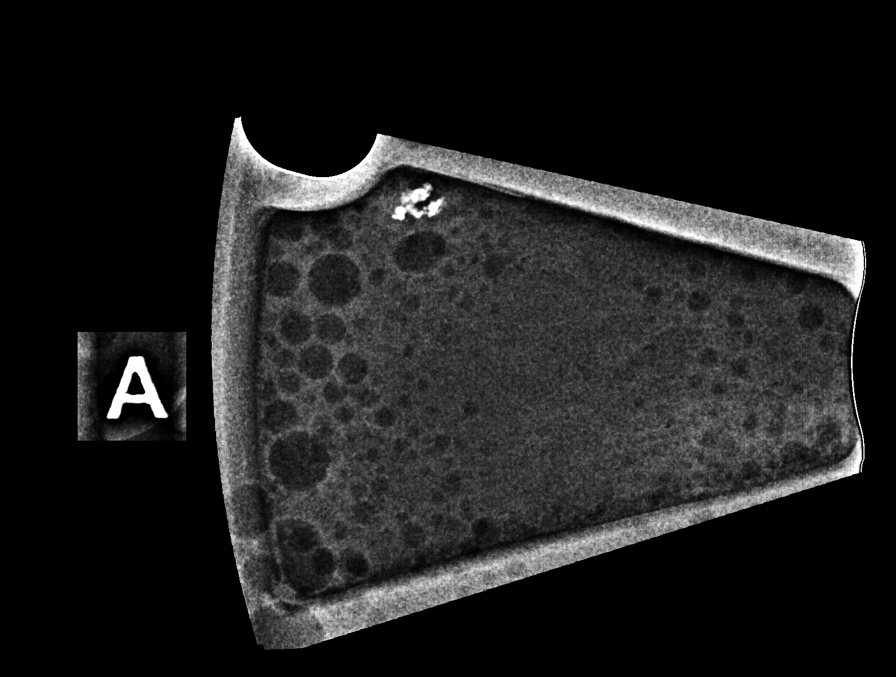

[R (4 of 5)]
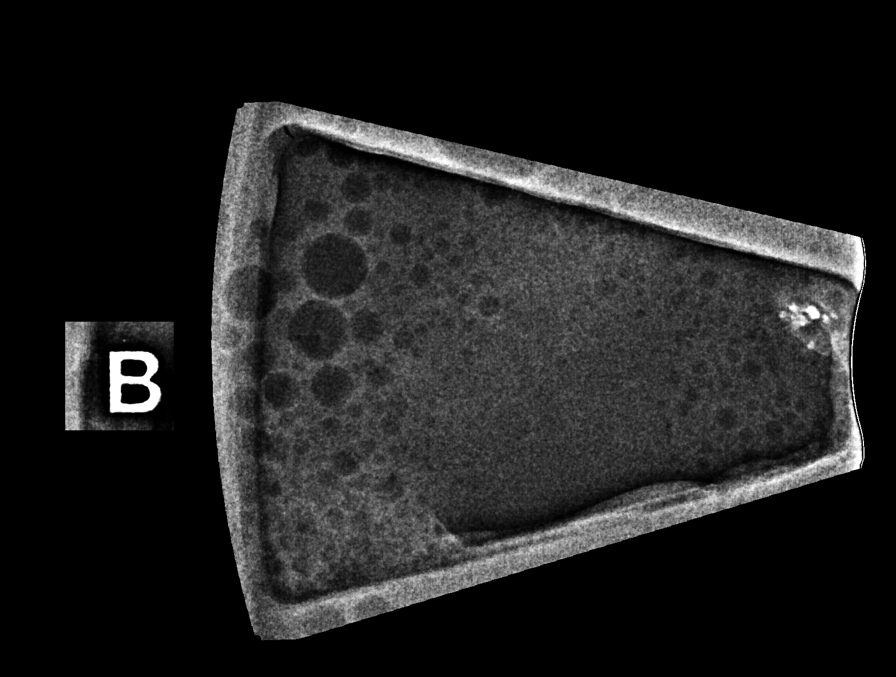

[R (5 of 5)]
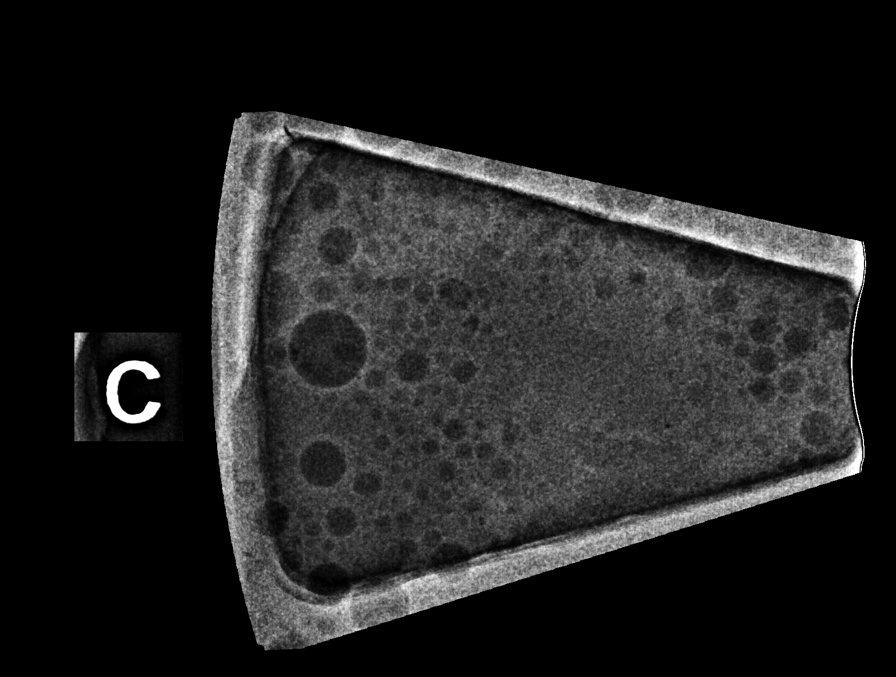

[R LM (1 of 3)]
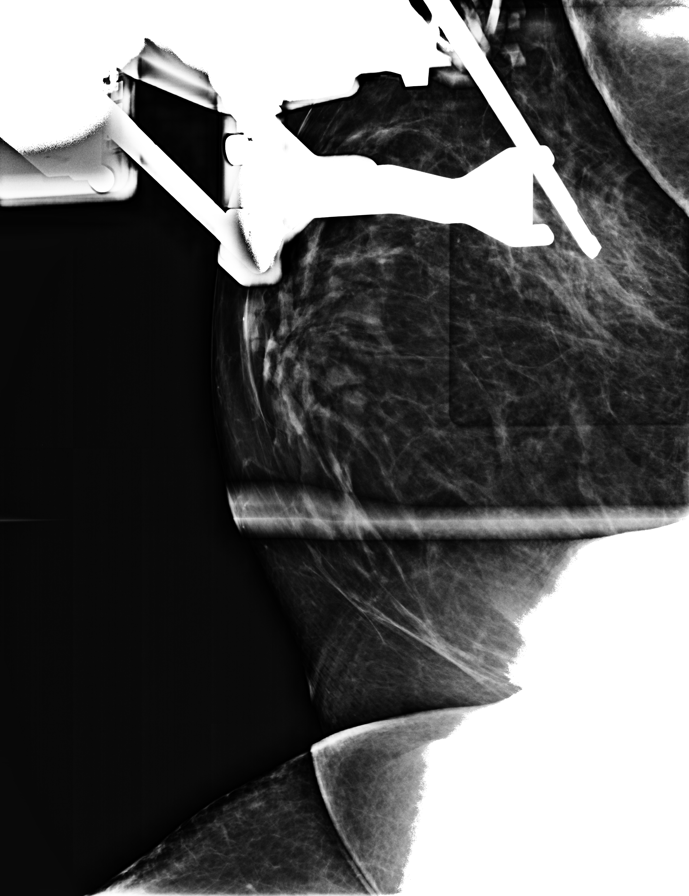

[R LM (2 of 3)]
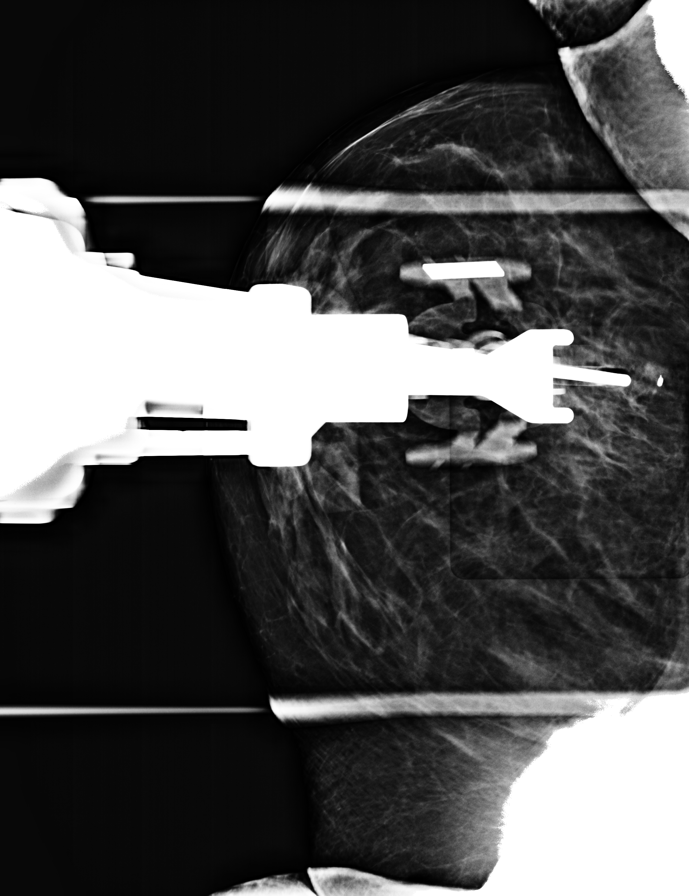

[R LM (3 of 3)]
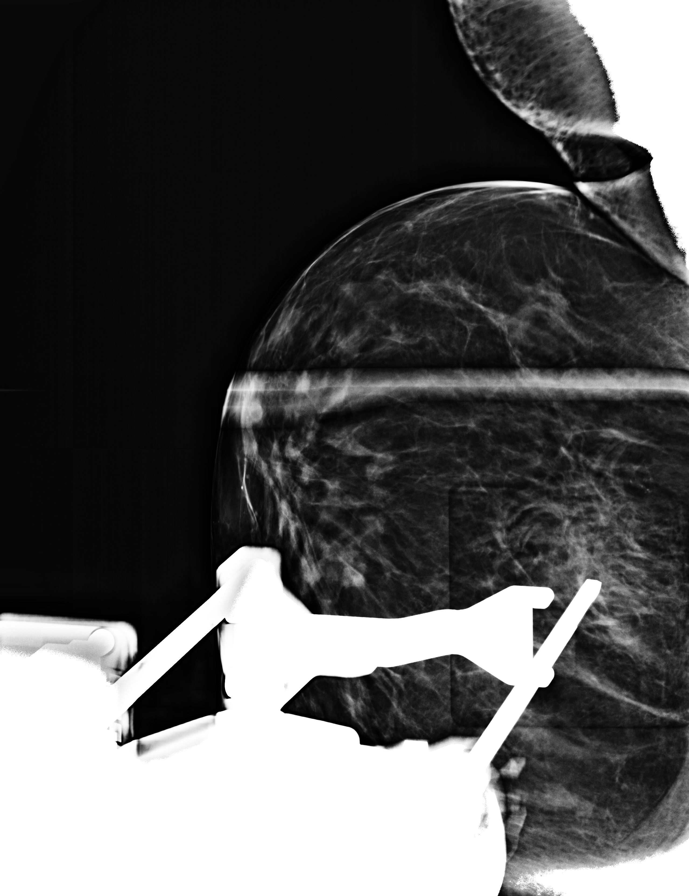

[8 of 33 positions shown; findings below may reference images not displayed]



Using sterile technique and 1% Lidocaine as local anesthetic, under
stereotactic guidance, a 9 gauge vacuum assisted device was used to
perform core needle biopsy of calcifications within the outer right
breast posterior depth using a lateral approach. Specimen radiograph
was performed showing calcifications. Specimens with calcifications
are identified for pathology.

Lesion quadrant: Lower outer quadrant

At the conclusion of the procedure, a coil shaped tissue marker clip
was deployed into the biopsy cavity. Follow-up 2-view mammogram was
performed and dictated separately.
IMPRESSION: Stereotactic-guided biopsy of right breast calcifications. No
apparent complications.

ADDENDUM:
Pathology revealed FIBROADENOMA WITH CALCIFICATIONS of the Right
breast, lateral. This was found to be concordant by Dr. FREDDY OSCAR.

Pathology results were discussed with the patient by telephone. The
patient reported doing well after the biopsy with tenderness and
minimal bleeding at the site. Post biopsy instructions and care were
reviewed and questions were answered. The patient was encouraged to
call The [REDACTED] for any additional
concerns.

The patient was instructed to return for annual screening
mammography at [HOSPITAL] [REDACTED] in [HOSPITAL][HOSPITAL].

Pathology results reported by FREDDY OSCAR, RN on [DATE].



Using sterile technique and 1% Lidocaine as local anesthetic, under
stereotactic guidance, a 9 gauge vacuum assisted device was used to
perform core needle biopsy of calcifications within the outer right
breast posterior depth using a lateral approach. Specimen radiograph
was performed showing calcifications. Specimens with calcifications
are identified for pathology.

Lesion quadrant: Lower outer quadrant

At the conclusion of the procedure, a coil shaped tissue marker clip
was deployed into the biopsy cavity. Follow-up 2-view mammogram was
performed and dictated separately.
IMPRESSION: Stereotactic-guided biopsy of right breast calcifications. No
apparent complications.

## 2019-02-22 NOTE — Telephone Encounter (Signed)
No notes needed

## 2019-03-06 ENCOUNTER — Other Ambulatory Visit: Payer: Self-pay | Admitting: Family Medicine

## 2019-03-06 DIAGNOSIS — J4520 Mild intermittent asthma, uncomplicated: Secondary | ICD-10-CM

## 2019-03-14 ENCOUNTER — Ambulatory Visit (INDEPENDENT_AMBULATORY_CARE_PROVIDER_SITE_OTHER): Payer: 59 | Admitting: Psychology

## 2019-03-14 DIAGNOSIS — F341 Dysthymic disorder: Secondary | ICD-10-CM | POA: Diagnosis not present

## 2019-03-14 DIAGNOSIS — F4320 Adjustment disorder, unspecified: Secondary | ICD-10-CM

## 2019-03-24 ENCOUNTER — Telehealth: Payer: Self-pay | Admitting: Family Medicine

## 2019-03-24 ENCOUNTER — Other Ambulatory Visit: Payer: Self-pay | Admitting: Family Medicine

## 2019-03-24 DIAGNOSIS — H9319 Tinnitus, unspecified ear: Secondary | ICD-10-CM

## 2019-03-24 NOTE — Telephone Encounter (Signed)
Referral done

## 2019-03-24 NOTE — Telephone Encounter (Signed)
OK to refer.

## 2019-03-24 NOTE — Telephone Encounter (Signed)
Copied from [place] [id number]. Topic: General - Other >> Mar 23, 2019  9:42 AM Ivar Drape wrote: Reason for CRM:   Patient would like to know if an Audiologist could be recommended to her.

## 2019-03-27 NOTE — Telephone Encounter (Signed)
done

## 2019-03-27 NOTE — Telephone Encounter (Signed)
Tonya [name], Tonya [name] (Patient) Tonya [name], Tonya [name] (Patient) General - Other  Summary: Callback  Patient needs a new referral to an audiologist within in [facility] possible that is a participating with [place]. The audiologist she was just referred to isnt a participant with [place]. Patient would like to go to a doctor to do an ear test for ringing of theears. Patient feels she hears a constant buzzing/humming sound.

## 2019-03-27 NOTE — Addendum Note (Signed)
Addended by: Sharon Seller B on: 03/27/2019 11:50 AM   Modules accepted: Orders

## 2019-03-27 NOTE — Telephone Encounter (Signed)
OK to refer to [facility] audiology.

## 2019-04-04 ENCOUNTER — Ambulatory Visit (INDEPENDENT_AMBULATORY_CARE_PROVIDER_SITE_OTHER): Payer: 59 | Admitting: Psychology

## 2019-04-04 DIAGNOSIS — F341 Dysthymic disorder: Secondary | ICD-10-CM

## 2019-04-04 DIAGNOSIS — F4320 Adjustment disorder, unspecified: Secondary | ICD-10-CM

## 2019-04-13 ENCOUNTER — Other Ambulatory Visit: Payer: Self-pay

## 2019-04-13 ENCOUNTER — Ambulatory Visit (INDEPENDENT_AMBULATORY_CARE_PROVIDER_SITE_OTHER): Payer: Managed Care, Other (non HMO)

## 2019-04-13 DIAGNOSIS — Z23 Encounter for immunization: Secondary | ICD-10-CM

## 2019-04-17 ENCOUNTER — Ambulatory Visit (INDEPENDENT_AMBULATORY_CARE_PROVIDER_SITE_OTHER): Payer: 59 | Admitting: Psychology

## 2019-04-17 DIAGNOSIS — F4320 Adjustment disorder, unspecified: Secondary | ICD-10-CM

## 2019-04-17 DIAGNOSIS — F341 Dysthymic disorder: Secondary | ICD-10-CM

## 2019-04-26 ENCOUNTER — Ambulatory Visit (INDEPENDENT_AMBULATORY_CARE_PROVIDER_SITE_OTHER): Payer: 59 | Admitting: Psychology

## 2019-04-26 DIAGNOSIS — F341 Dysthymic disorder: Secondary | ICD-10-CM | POA: Diagnosis not present

## 2019-04-26 DIAGNOSIS — F4320 Adjustment disorder, unspecified: Secondary | ICD-10-CM

## 2019-04-29 ENCOUNTER — Other Ambulatory Visit: Payer: Self-pay | Admitting: Family Medicine

## 2019-04-29 DIAGNOSIS — J4520 Mild intermittent asthma, uncomplicated: Secondary | ICD-10-CM

## 2019-05-04 ENCOUNTER — Ambulatory Visit: Payer: Medicare Other | Admitting: Podiatry

## 2019-05-06 ENCOUNTER — Ambulatory Visit: Payer: Medicare Other | Admitting: Podiatry

## 2019-05-10 ENCOUNTER — Ambulatory Visit (INDEPENDENT_AMBULATORY_CARE_PROVIDER_SITE_OTHER): Payer: 59 | Admitting: Psychology

## 2019-05-10 DIAGNOSIS — F341 Dysthymic disorder: Secondary | ICD-10-CM

## 2019-05-10 DIAGNOSIS — F4320 Adjustment disorder, unspecified: Secondary | ICD-10-CM | POA: Diagnosis not present

## 2019-05-15 ENCOUNTER — Other Ambulatory Visit: Payer: Self-pay | Admitting: Podiatry

## 2019-05-15 ENCOUNTER — Other Ambulatory Visit: Payer: Self-pay

## 2019-05-15 ENCOUNTER — Ambulatory Visit (INDEPENDENT_AMBULATORY_CARE_PROVIDER_SITE_OTHER): Payer: Managed Care, Other (non HMO) | Admitting: Podiatry

## 2019-05-15 ENCOUNTER — Ambulatory Visit (INDEPENDENT_AMBULATORY_CARE_PROVIDER_SITE_OTHER): Payer: Managed Care, Other (non HMO)

## 2019-05-15 DIAGNOSIS — M778 Other enthesopathies, not elsewhere classified: Secondary | ICD-10-CM | POA: Diagnosis not present

## 2019-05-15 DIAGNOSIS — G5762 Lesion of plantar nerve, left lower limb: Secondary | ICD-10-CM

## 2019-05-15 DIAGNOSIS — M205X2 Other deformities of toe(s) (acquired), left foot: Secondary | ICD-10-CM

## 2019-05-15 DIAGNOSIS — M779 Enthesopathy, unspecified: Secondary | ICD-10-CM | POA: Diagnosis not present

## 2019-05-15 DIAGNOSIS — M79672 Pain in left foot: Secondary | ICD-10-CM

## 2019-05-15 DIAGNOSIS — M205X1 Other deformities of toe(s) (acquired), right foot: Secondary | ICD-10-CM

## 2019-05-15 DIAGNOSIS — M216X2 Other acquired deformities of left foot: Secondary | ICD-10-CM | POA: Diagnosis not present

## 2019-05-15 DIAGNOSIS — M216X1 Other acquired deformities of right foot: Secondary | ICD-10-CM | POA: Diagnosis not present

## 2019-05-15 MED ORDER — MELOXICAM 15 MG PO TABS: 15.0000 mg | ORAL_TABLET | Freq: Every day | ORAL | 0 refills | Status: DC

## 2019-05-17 ENCOUNTER — Ambulatory Visit (INDEPENDENT_AMBULATORY_CARE_PROVIDER_SITE_OTHER): Payer: 59 | Admitting: Psychology

## 2019-05-17 DIAGNOSIS — F4320 Adjustment disorder, unspecified: Secondary | ICD-10-CM

## 2019-05-17 DIAGNOSIS — F341 Dysthymic disorder: Secondary | ICD-10-CM | POA: Diagnosis not present

## 2019-05-22 NOTE — Progress Notes (Signed)
Subjective:   Patient ID: Tonya [name], female   DOB: 68 y.o.   MRN: [id number]   HPI 68 year old female presents the office with concerns of left foot pain into the ball area of her foot.  She states that when she is walking she feels there is bones popping she is concerned that may be a nerve.  She gets sharp pain to her second toe at times.  Does not seem to go the other digits.  This is most on the left second toe.  She states that flat shoes to aggravate her symptoms and swells too much walking.  She does have a history of falling off a ladder having some nerve damage to her leg.   Review of Systems  All other systems reviewed and are negative.  Past Medical History:  Diagnosis Date  . Hypertension   . Osteopenia     Past Surgical History:  Procedure Laterality Date  . ABDOMINAL HYSTERECTOMY  2015  . MASTECTOMY Left   . TONSILLECTOMY AND ADENOIDECTOMY  1958     Current Outpatient Medications:  .  calcium-vitamin D (OSCAL WITH D) 250-125 MG-UNIT tablet, Take 1 tablet by mouth daily., Disp: , Rfl:  .  losartan (COZAAR) 50 MG tablet, TAKE 1 TABLET BY MOUTH  DAILY, Disp: 90 tablet, Rfl: 3 .  meloxicam (MOBIC) 15 MG tablet, Take 1 tablet (15 mg total) by mouth daily., Disp: 30 tablet, Rfl: 0 .  metoprolol succinate (TOPROL-XL) 25 MG 24 hr tablet, TAKE 1 TABLET BY MOUTH  DAILY, Disp: 90 tablet, Rfl: 3 .  metroNIDAZOLE (FLAGYL) 500 MG tablet, Take 1 tablet (500 mg total) by mouth 2 (two) times daily., Disp: 14 tablet, Rfl: 0 .  montelukast (SINGULAIR) 10 MG tablet, TAKE 1 TABLET BY MOUTH AT  BEDTIME, Disp: 90 tablet, Rfl: 3 .  pantoprazole (PROTONIX) 20 MG tablet, TAKE 1 TABLET BY MOUTH  DAILY, Disp: 90 tablet, Rfl: 2 .  tiZANidine (ZANAFLEX) 4 MG tablet, Take 1 tablet (4 mg total) by mouth every 8 (eight) hours as needed for muscle spasms., Disp: 20 tablet, Rfl: 0 .  meloxicam (MOBIC) 15 MG tablet, Take 1 tablet (15 mg total) by mouth daily., Disp: 30 tablet, Rfl: 0  No Known  Allergies       Objective:  Physical Exam  General: AAO x3, NAD  Dermatological: Skin is warm, dry and supple bilateral. Nails x 10 are well manicured; remaining integument appears unremarkable at this time. There are no open sores, no preulcerative lesions, no rash or signs of infection present.  Vascular: Dorsalis Pedis artery and Posterior Tibial artery pedal pulses are 2/4 bilateral with immedate capillary fill time. Pedal hair growth present. No varicosities and no lower extremity edema present bilateral. There is no pain with calf compression, swelling, warmth, erythema.   Neruologic: Grossly intact via light touch bilateral. Protective threshold with Semmes Wienstein monofilament intact to all pedal sites bilateral.  Negative Tinel sign.  Musculoskeletal: There is tenderness on the left foot submetatarsal 2 but also tenderness on the second interspace.  There is a palpable neuroma versus bursa in the second interspace.  On today's exam she is not expensing any nerve symptoms into the toe.  Mild decreased range of motion in dorsiflexion of the first MPJ.  No area of pinpoint tenderness.  Mild edema submetatarsal 2 but is no erythema or warmth.  Muscular strength 5/5 in all groups tested bilateral.  Gait: Unassisted, Nonantalgic.      Assessment:   Left  foot submetatarsal 2 capsulitis, concern for neuroma    Plan:  -Treatment options discussed including all alternatives, risks, and complications -Etiology of symptoms were discussed -X-rays were obtained and reviewed with the patient.  Elevated first ray is present.  Elongated second metatarsal.  There is no evidence of acute fracture or stress fracture. -We discussed multiple treatment options.  Today prescribed meloxicam discussed side effects medication.  Discussed changing shoes as well as wearing more supportive shoes.  We discussed orthotics.  We evaluated her today for inserts and was molded.  Return in about 4 weeks (around  06/12/2019).  [name] Slade DPM

## 2019-05-23 ENCOUNTER — Telehealth: Payer: Self-pay | Admitting: Podiatry

## 2019-05-23 NOTE — Telephone Encounter (Signed)
Pt returned call and is wanting to proceed. She is aware of benefits and that there is no guarantee of payment. Covered @ 80% after dedcutibleand pt has met that

## 2019-05-23 NOTE — Telephone Encounter (Signed)
Left message for pt to call to discuss orthotic benefits. °

## 2019-05-26 ENCOUNTER — Ambulatory Visit (INDEPENDENT_AMBULATORY_CARE_PROVIDER_SITE_OTHER): Payer: 59 | Admitting: Psychology

## 2019-05-26 DIAGNOSIS — F341 Dysthymic disorder: Secondary | ICD-10-CM | POA: Diagnosis not present

## 2019-05-26 DIAGNOSIS — F4320 Adjustment disorder, unspecified: Secondary | ICD-10-CM | POA: Diagnosis not present

## 2019-06-01 ENCOUNTER — Telehealth: Payer: Self-pay

## 2019-06-01 DIAGNOSIS — B379 Candidiasis, unspecified: Secondary | ICD-10-CM

## 2019-06-01 MED ORDER — FLUCONAZOLE 150 MG PO TABS: 150.0000 mg | ORAL_TABLET | Freq: Once | ORAL | 0 refills | Status: AC

## 2019-06-01 NOTE — Telephone Encounter (Signed)
Pt called the office requesting a Rx for Diflucan. Pt states she has been experiencing some vaginal itching and white discharge x 1 week. Pt advised to call the office if symptoms persist after using the medication. Understanding was voiced. Medication was sent to the pharmacy. chiquita l wilson, CMA

## 2019-06-05 ENCOUNTER — Ambulatory Visit (INDEPENDENT_AMBULATORY_CARE_PROVIDER_SITE_OTHER): Payer: 59 | Admitting: Psychology

## 2019-06-05 DIAGNOSIS — F4320 Adjustment disorder, unspecified: Secondary | ICD-10-CM

## 2019-06-05 DIAGNOSIS — F341 Dysthymic disorder: Secondary | ICD-10-CM | POA: Diagnosis not present

## 2019-06-06 ENCOUNTER — Telehealth: Payer: Self-pay | Admitting: Podiatry

## 2019-06-06 NOTE — Telephone Encounter (Signed)
Received a 3 way voicemail from Tonya [name] [facility]) stating pt was fitted for orthotics and in order for them to be covered they have to be ordered thru [employer] [phone]) and if I needed to call [employer] back [phone].She never gave me pts name just pts number..  I then received a voicemail from pt for me to call her back.   I returned call and explained that when I called [employer] I was never given any information about having to order them thru that company. I was told no Josem Kaufmann was needed and dx driven and covered @ 80% after deductible (pt had met)...  She also talked with Billing and was told to hold off that we had resubmitted the claim and to wait to see.

## 2019-06-12 ENCOUNTER — Telehealth: Payer: Self-pay | Admitting: Podiatry

## 2019-06-12 NOTE — Telephone Encounter (Signed)
Received a call from Alanson Aly from [place] and he was discussing pts orthotics(L3020) that have been denied by insurance. The codes listed do not cover them. He said that they go thru a company called [employer]). I explained that we actually bill insurance thru our doctor not thru outside [place]. I also explained that I had a message left 12.15 from sheryl that it needed to go thru [email or web address]Marland Kitchen He stated that I was given the wrong information and we should do a peer to peer to possibly get it covered. He said that I should have been told it needed a pre certification. Ref # [id number]...  [employer] ref # is [id number] and the number is [id number].

## 2019-06-13 ENCOUNTER — Ambulatory Visit (INDEPENDENT_AMBULATORY_CARE_PROVIDER_SITE_OTHER): Payer: 59 | Admitting: Psychology

## 2019-06-13 ENCOUNTER — Encounter: Payer: Medicare Other | Admitting: Orthotics

## 2019-06-13 DIAGNOSIS — F341 Dysthymic disorder: Secondary | ICD-10-CM

## 2019-06-13 DIAGNOSIS — F4320 Adjustment disorder, unspecified: Secondary | ICD-10-CM

## 2019-06-21 NOTE — Telephone Encounter (Signed)
I called the [id number] number and got an automated system and gave the L3020 cpt code and it said no prior approval required... ref # [id number].Marland KitchenMarland Kitchen

## 2019-06-30 ENCOUNTER — Other Ambulatory Visit: Payer: Self-pay

## 2019-06-30 ENCOUNTER — Ambulatory Visit: Payer: Medicare Other | Admitting: Orthotics

## 2019-06-30 DIAGNOSIS — M778 Other enthesopathies, not elsewhere classified: Secondary | ICD-10-CM

## 2019-06-30 DIAGNOSIS — G5762 Lesion of plantar nerve, left lower limb: Secondary | ICD-10-CM

## 2019-06-30 DIAGNOSIS — M205X2 Other deformities of toe(s) (acquired), left foot: Secondary | ICD-10-CM

## 2019-06-30 NOTE — Progress Notes (Signed)
Patient came in today to p/up functional foot orthotics.   The orthotics were assessed to both fit and function.  The F/O addressed the biomechanical issues/pathologies as intended, offering good longitudinal arch support, proper offloading, and foot support. There weren't any signs of discomfort or irritation.  The F/O fit properly in footwear with minimal trimming/adjustments. 

## 2019-07-03 ENCOUNTER — Telehealth: Payer: Self-pay

## 2019-07-03 DIAGNOSIS — B009 Herpesviral infection, unspecified: Secondary | ICD-10-CM

## 2019-07-03 MED ORDER — VALACYCLOVIR HCL 1 G PO TABS: 1000.0000 mg | ORAL_TABLET | Freq: Two times a day (BID) | ORAL | 4 refills | Status: DC

## 2019-07-03 NOTE — Telephone Encounter (Signed)
Pt called the office requesting a Rx for ninety days supply of Valtrex.Pt states she has been on Valtrex for over twenty years. Pt states she is having an outbreak and some irritation. Medication was sent to pt's pharmacy. Understanding was voiced. Randall Rampersad l Kathrina Crosley, CMA

## 2019-07-04 ENCOUNTER — Other Ambulatory Visit: Payer: Medicare Other | Admitting: Orthotics

## 2019-07-07 ENCOUNTER — Other Ambulatory Visit: Payer: Self-pay

## 2019-07-07 ENCOUNTER — Other Ambulatory Visit: Payer: Medicare Other | Admitting: Orthotics

## 2019-07-17 ENCOUNTER — Other Ambulatory Visit: Payer: Medicare Other | Admitting: Orthotics

## 2019-07-19 ENCOUNTER — Other Ambulatory Visit: Payer: Self-pay

## 2019-07-19 ENCOUNTER — Ambulatory Visit: Payer: Medicare Other | Admitting: Orthotics

## 2019-07-19 DIAGNOSIS — G5762 Lesion of plantar nerve, left lower limb: Secondary | ICD-10-CM

## 2019-07-19 DIAGNOSIS — M205X2 Other deformities of toe(s) (acquired), left foot: Secondary | ICD-10-CM

## 2019-07-19 DIAGNOSIS — M778 Other enthesopathies, not elsewhere classified: Secondary | ICD-10-CM

## 2019-07-19 NOTE — Progress Notes (Signed)
Added forefoot dancers pad as well as neurma cushion 1/2 Left.

## 2019-07-21 ENCOUNTER — Ambulatory Visit (INDEPENDENT_AMBULATORY_CARE_PROVIDER_SITE_OTHER): Payer: 59 | Admitting: Psychology

## 2019-07-21 DIAGNOSIS — F341 Dysthymic disorder: Secondary | ICD-10-CM

## 2019-07-21 DIAGNOSIS — F4320 Adjustment disorder, unspecified: Secondary | ICD-10-CM

## 2019-07-25 ENCOUNTER — Other Ambulatory Visit: Payer: Self-pay

## 2019-07-25 ENCOUNTER — Ambulatory Visit: Payer: Medicare Other | Admitting: Orthotics

## 2019-07-25 DIAGNOSIS — M205X2 Other deformities of toe(s) (acquired), left foot: Secondary | ICD-10-CM

## 2019-07-25 DIAGNOSIS — M778 Other enthesopathies, not elsewhere classified: Secondary | ICD-10-CM

## 2019-07-25 DIAGNOSIS — G5762 Lesion of plantar nerve, left lower limb: Secondary | ICD-10-CM

## 2019-07-25 NOTE — Progress Notes (Signed)
Patient f/o were adjusted by thinning out dancer's pad and moving neurmo pad more proximal.  She will try this for about a week and t hen come back to have top cover glued on.

## 2019-08-01 ENCOUNTER — Ambulatory Visit (INDEPENDENT_AMBULATORY_CARE_PROVIDER_SITE_OTHER): Payer: 59 | Admitting: Psychology

## 2019-08-01 ENCOUNTER — Ambulatory Visit: Payer: Medicare Other | Admitting: Orthotics

## 2019-08-01 ENCOUNTER — Other Ambulatory Visit: Payer: Self-pay

## 2019-08-01 DIAGNOSIS — M205X2 Other deformities of toe(s) (acquired), left foot: Secondary | ICD-10-CM

## 2019-08-01 DIAGNOSIS — F4320 Adjustment disorder, unspecified: Secondary | ICD-10-CM | POA: Diagnosis not present

## 2019-08-01 DIAGNOSIS — F341 Dysthymic disorder: Secondary | ICD-10-CM

## 2019-08-01 DIAGNOSIS — M778 Other enthesopathies, not elsewhere classified: Secondary | ICD-10-CM

## 2019-08-01 DIAGNOSIS — G5762 Lesion of plantar nerve, left lower limb: Secondary | ICD-10-CM

## 2019-08-01 NOTE — Progress Notes (Signed)
Glued topcover on to make modifications permanent.

## 2019-08-14 ENCOUNTER — Other Ambulatory Visit: Payer: Self-pay

## 2019-08-14 ENCOUNTER — Ambulatory Visit: Payer: 59 | Admitting: Psychology

## 2019-08-14 MED ORDER — FLUCONAZOLE 150 MG PO TABS: 150.0000 mg | ORAL_TABLET | Freq: Once | ORAL | 0 refills | Status: AC

## 2019-08-14 NOTE — Telephone Encounter (Signed)
Patient called stating that she is having vaginal itching and white discharge. Patient requesting diflucan for yeast infection. Patient states that the itching has increased in last two days.   Denies any new sexual partners. Will send in diflucan per protocol and have place patient on list to call in July for annual exam. Kathrene Alu RN

## 2019-08-15 ENCOUNTER — Ambulatory Visit (INDEPENDENT_AMBULATORY_CARE_PROVIDER_SITE_OTHER): Payer: 59 | Admitting: Psychology

## 2019-08-15 ENCOUNTER — Ambulatory Visit: Payer: 59 | Admitting: Psychology

## 2019-08-15 DIAGNOSIS — F341 Dysthymic disorder: Secondary | ICD-10-CM

## 2019-08-15 DIAGNOSIS — F4320 Adjustment disorder, unspecified: Secondary | ICD-10-CM

## 2019-08-24 ENCOUNTER — Encounter: Payer: Self-pay | Admitting: Family Medicine

## 2019-08-24 ENCOUNTER — Other Ambulatory Visit: Payer: Self-pay

## 2019-08-24 ENCOUNTER — Ambulatory Visit (INDEPENDENT_AMBULATORY_CARE_PROVIDER_SITE_OTHER): Payer: Managed Care, Other (non HMO) | Admitting: Family Medicine

## 2019-08-24 VITALS — BP 133/66 | HR 102 | Ht 67.0 in | Wt 223.0 lb

## 2019-08-24 DIAGNOSIS — N898 Other specified noninflammatory disorders of vagina: Secondary | ICD-10-CM | POA: Diagnosis not present

## 2019-08-24 DIAGNOSIS — B9689 Other specified bacterial agents as the cause of diseases classified elsewhere: Secondary | ICD-10-CM | POA: Diagnosis not present

## 2019-08-24 DIAGNOSIS — R5383 Other fatigue: Secondary | ICD-10-CM

## 2019-08-24 DIAGNOSIS — B373 Candidiasis of vulva and vagina: Secondary | ICD-10-CM | POA: Diagnosis not present

## 2019-08-24 DIAGNOSIS — N760 Acute vaginitis: Secondary | ICD-10-CM | POA: Diagnosis not present

## 2019-08-24 DIAGNOSIS — B3731 Acute candidiasis of vulva and vagina: Secondary | ICD-10-CM

## 2019-08-24 MED ORDER — CLOTRIMAZOLE 1 % EX CREA: 1.0000 "application " | TOPICAL_CREAM | Freq: Two times a day (BID) | CUTANEOUS | 3 refills | Status: DC

## 2019-08-24 NOTE — Progress Notes (Signed)
   Subjective:    Patient ID: Tonya [name], female    DOB: Aug 26, 1950, 69 y.o.   MRN: [id number]  HPI  Patient seen for labial and clitoral irritation. Has been increasing over the past several weeks. Noticed some white discharge and erythema surrounding clitoris with itching and irritation. Has been avoiding soaps, air drying, and hasn't been having sex over last week. Mildly improved. Also notes some vaginal discharge.  Also noted fatigue over past several months. Has to take nap every day. No palliating or provoking factors. No weight loss, weight gain. This started after having COVID last year.  Review of Systems     Objective:   Physical Exam Vitals reviewed. Exam conducted with a chaperone present.  Constitutional:      Appearance: Normal appearance.  Cardiovascular:     Rate and Rhythm: Normal rate and regular rhythm.     Pulses: Normal pulses.  Abdominal:     Hernia: There is no hernia in the left inguinal area or right inguinal area.  Genitourinary:    Labia:        Right: No rash, tenderness, lesion or injury.        Left: No rash, tenderness, lesion or injury.     Lymphadenopathy:     Lower Body: No right inguinal adenopathy. No left inguinal adenopathy.  Neurological:     Mental Status: She is alert.       Assessment & Plan:  1. Vaginal discharge - Cervicovaginal ancillary only( [place])  2. Fatigue, unspecified type May be Post COVID chronic fatigue syndrome. Check TSH, CBC, CMP.   - CBC - Comprehensive metabolic panel - TSH  3. Vulvovaginitis due to yeast Involving clitoris. Topical clotrimazole BID x10days. Return if not improved.

## 2019-08-24 NOTE — Progress Notes (Signed)
Patient has recent vaginal itching on and off. Kathrene Alu RN

## 2019-08-26 LAB — CBC
Hematocrit: 41.1 % (ref 34.0–46.6)
Hemoglobin: 13.9 g/dL (ref 11.1–15.9)
MCH: 32.9 pg (ref 26.6–33.0)
MCHC: 33.8 g/dL (ref 31.5–35.7)
MCV: 97 fL (ref 79–97)
Platelets: 206 10*3/uL (ref 150–450)
RBC: 4.23 x10E6/uL (ref 3.77–5.28)
RDW: 12.4 % (ref 11.7–15.4)
WBC: 8.2 10*3/uL (ref 3.4–10.8)

## 2019-08-26 LAB — COMPREHENSIVE METABOLIC PANEL
ALT: 27 IU/L (ref 0–32)
AST: 29 IU/L (ref 0–40)
Albumin/Globulin Ratio: 1.6 (ref 1.2–2.2)
Albumin: 4.4 g/dL (ref 3.8–4.8)
Alkaline Phosphatase: 93 IU/L (ref 39–117)
BUN/Creatinine Ratio: 17 (ref 12–28)
BUN: 14 mg/dL (ref 8–27)
Bilirubin Total: 0.5 mg/dL (ref 0.0–1.2)
CO2: 22 mmol/L (ref 20–29)
Calcium: 9.6 mg/dL (ref 8.7–10.3)
Chloride: 103 mmol/L (ref 96–106)
Creatinine, Ser: 0.81 mg/dL (ref 0.57–1.00)
GFR calc Af Amer: 87 mL/min/{1.73_m2} (ref >59)
GFR calc non Af Amer: 75 mL/min/{1.73_m2} (ref >59)
Globulin, Total: 2.8 g/dL (ref 1.5–4.5)
Glucose: 93 mg/dL (ref 65–99)
Potassium: 4.6 mmol/L (ref 3.5–5.2)
Sodium: 139 mmol/L (ref 134–144)
Total Protein: 7.2 g/dL (ref 6.0–8.5)

## 2019-08-26 LAB — TSH: TSH: 1.75 u[IU]/mL (ref 0.450–4.500)

## 2019-08-28 ENCOUNTER — Telehealth: Payer: Self-pay

## 2019-08-28 DIAGNOSIS — B9689 Other specified bacterial agents as the cause of diseases classified elsewhere: Secondary | ICD-10-CM

## 2019-08-28 LAB — CERVICOVAGINAL ANCILLARY ONLY
Bacterial Vaginitis (gardnerella): POSITIVE — AB
Candida Glabrata: NEGATIVE
Candida Vaginitis: NEGATIVE
Comment: NEGATIVE
Comment: NEGATIVE
Comment: NEGATIVE

## 2019-08-28 MED ORDER — METRONIDAZOLE 500 MG PO TABS: 500.0000 mg | ORAL_TABLET | Freq: Two times a day (BID) | ORAL | 0 refills | Status: DC

## 2019-08-28 NOTE — Telephone Encounter (Signed)
Pt called the office requesting results. Pt made aware that she has BV. Pt also had questions about her labs. Will send message to provider. chiquita l wilson, CMA

## 2019-08-29 ENCOUNTER — Telehealth: Payer: Self-pay

## 2019-08-29 ENCOUNTER — Ambulatory Visit: Payer: 59 | Admitting: Psychology

## 2019-08-29 ENCOUNTER — Telehealth: Payer: Self-pay | Admitting: Family Medicine

## 2019-08-29 NOTE — Telephone Encounter (Signed)
RE- blood work is normal.   AF- A specialty office should not be telling patients to follow up with PCP for lab review that the specialist ordered of his/her own volition. If a said provider is out of the office, they have partners to cover for them. Please make sure their front staff knows this. I doubt it is an office policy as I've never had any issues with that group, but if it is, I would like to know. Thank you.

## 2019-08-29 NOTE — Telephone Encounter (Signed)
Dr. Nehemiah Settle of OB/GYN drew these labs. Ty.

## 2019-08-29 NOTE — Telephone Encounter (Signed)
Error

## 2019-08-29 NOTE — Telephone Encounter (Signed)
Patient would like most recent lab results.

## 2019-08-29 NOTE — Telephone Encounter (Signed)
Called the patient informed of PCP response. She stated the MD that drew labs is out of the office and she was informed she could just call her PCP regarding these results. She is concerned she may be borderline anemic. Offered several times available over the next few days to discuss with PCP as virtual visit  but did not work for her schedule.  She stated if PCP felt labs are ok to wait until her next appt with PCP on 09/19/19 then she will just wait.

## 2019-08-30 NOTE — Telephone Encounter (Signed)
Called and did inform her of results/instructions. Also discussed situation with specialist office.  She stated that Dr. Nehemiah Settle did send her a mychart message yesterday regarding her results and was aware she had requested them.

## 2019-09-01 NOTE — Telephone Encounter (Signed)
After review, pt was made aware of BV results prior to calling our office. When speaking with her OBGYN she expressed she had additional questions and a message was sent to her provider there.

## 2019-09-07 ENCOUNTER — Other Ambulatory Visit: Payer: Self-pay | Admitting: Family Medicine

## 2019-09-07 DIAGNOSIS — K219 Gastro-esophageal reflux disease without esophagitis: Secondary | ICD-10-CM

## 2019-09-11 ENCOUNTER — Ambulatory Visit: Payer: 59 | Admitting: Psychology

## 2019-09-11 ENCOUNTER — Other Ambulatory Visit: Payer: Self-pay

## 2019-09-11 MED ORDER — FLUCONAZOLE 150 MG PO TABS: 150.0000 mg | ORAL_TABLET | Freq: Once | ORAL | 0 refills | Status: AC

## 2019-09-14 ENCOUNTER — Other Ambulatory Visit (HOSPITAL_COMMUNITY)
Admission: RE | Admit: 2019-09-14 | Discharge: 2019-09-14 | Disposition: A | Discharge status: Home / Self Care | Payer: Managed Care, Other (non HMO) | Source: Ambulatory Visit | Attending: Family Medicine | Admitting: Family Medicine

## 2019-09-14 ENCOUNTER — Other Ambulatory Visit: Payer: Self-pay

## 2019-09-14 ENCOUNTER — Ambulatory Visit: Payer: Managed Care, Other (non HMO)

## 2019-09-14 DIAGNOSIS — N949 Unspecified condition associated with female genital organs and menstrual cycle: Secondary | ICD-10-CM | POA: Insufficient documentation

## 2019-09-14 DIAGNOSIS — N898 Other specified noninflammatory disorders of vagina: Secondary | ICD-10-CM

## 2019-09-14 MED ORDER — FLUCONAZOLE 150 MG PO TABS: 150.0000 mg | ORAL_TABLET | Freq: Once | ORAL | 0 refills | Status: AC

## 2019-09-14 NOTE — Progress Notes (Signed)
Patient presents for self swab because she has recently finished a round of flagyl and has taken one diflucan. Patient was prescribed another diflucan today because she is still having burning - patient notes especially around the clitoris. Patient made aware we will send it for aptima culture and let her know results. Kathrene Alu RN

## 2019-09-14 NOTE — Progress Notes (Signed)
Pt called stating she is still having symptoms of a yeast infection. States she took 1 150 mg Diflucan on Monday and she felt better now symptoms have returned. Advised pt to take another dose and to call the office on Monday if symptoms persists. Understanding was voiced.  Indy Kuck l Adonus Uselman, CMA

## 2019-09-15 ENCOUNTER — Ambulatory Visit (INDEPENDENT_AMBULATORY_CARE_PROVIDER_SITE_OTHER): Payer: 59 | Admitting: Psychology

## 2019-09-15 ENCOUNTER — Other Ambulatory Visit: Payer: Self-pay | Admitting: Family Medicine

## 2019-09-15 DIAGNOSIS — F4320 Adjustment disorder, unspecified: Secondary | ICD-10-CM

## 2019-09-15 DIAGNOSIS — F341 Dysthymic disorder: Secondary | ICD-10-CM

## 2019-09-15 DIAGNOSIS — W19XXXA Unspecified fall, initial encounter: Secondary | ICD-10-CM

## 2019-09-15 DIAGNOSIS — B9689 Other specified bacterial agents as the cause of diseases classified elsewhere: Secondary | ICD-10-CM

## 2019-09-15 LAB — CERVICOVAGINAL ANCILLARY ONLY
Bacterial Vaginitis (gardnerella): POSITIVE — AB
Candida Glabrata: NEGATIVE
Candida Vaginitis: NEGATIVE
Chlamydia: NEGATIVE
Comment: NEGATIVE
Comment: NEGATIVE
Comment: NEGATIVE
Comment: NEGATIVE
Comment: NORMAL
Neisseria Gonorrhea: NEGATIVE

## 2019-09-15 MED ORDER — METRONIDAZOLE 500 MG PO TABS: 500.0000 mg | ORAL_TABLET | Freq: Two times a day (BID) | ORAL | 0 refills | Status: DC

## 2019-09-15 MED ORDER — FLUCONAZOLE 150 MG PO TABS: 150.0000 mg | ORAL_TABLET | Freq: Once | ORAL | 0 refills | Status: AC

## 2019-09-15 MED ORDER — TINIDAZOLE 500 MG PO TABS: 1.0000 g | ORAL_TABLET | Freq: Every day | ORAL | 0 refills | Status: AC

## 2019-09-18 ENCOUNTER — Other Ambulatory Visit: Payer: Self-pay

## 2019-09-19 ENCOUNTER — Encounter: Payer: Self-pay | Admitting: Family Medicine

## 2019-09-19 ENCOUNTER — Other Ambulatory Visit: Payer: Self-pay

## 2019-09-19 ENCOUNTER — Ambulatory Visit (INDEPENDENT_AMBULATORY_CARE_PROVIDER_SITE_OTHER): Payer: Managed Care, Other (non HMO) | Admitting: Family Medicine

## 2019-09-19 VITALS — BP 122/84 | HR 108 | Temp 96.3°F | Ht 67.0 in | Wt 220.0 lb

## 2019-09-19 DIAGNOSIS — G8929 Other chronic pain: Secondary | ICD-10-CM

## 2019-09-19 DIAGNOSIS — J4520 Mild intermittent asthma, uncomplicated: Secondary | ICD-10-CM

## 2019-09-19 DIAGNOSIS — I10 Essential (primary) hypertension: Secondary | ICD-10-CM

## 2019-09-19 DIAGNOSIS — Z0000 Encounter for general adult medical examination without abnormal findings: Secondary | ICD-10-CM

## 2019-09-19 DIAGNOSIS — K219 Gastro-esophageal reflux disease without esophagitis: Secondary | ICD-10-CM

## 2019-09-19 DIAGNOSIS — Z23 Encounter for immunization: Secondary | ICD-10-CM | POA: Diagnosis not present

## 2019-09-19 DIAGNOSIS — M7630 Iliotibial band syndrome, unspecified leg: Secondary | ICD-10-CM | POA: Diagnosis not present

## 2019-09-19 DIAGNOSIS — M25561 Pain in right knee: Secondary | ICD-10-CM

## 2019-09-19 DIAGNOSIS — M25562 Pain in left knee: Secondary | ICD-10-CM | POA: Insufficient documentation

## 2019-09-19 LAB — LIPID PANEL
Cholesterol: 201 mg/dL — ABNORMAL HIGH (ref 0–200)
HDL: 55 mg/dL (ref >39.00)
LDL Cholesterol: 126 mg/dL — ABNORMAL HIGH (ref 0–99)
NonHDL: 145.75
Total CHOL/HDL Ratio: 4
Triglycerides: 97 mg/dL (ref 0.0–149.0)
VLDL: 19.4 mg/dL (ref 0.0–40.0)

## 2019-09-19 MED ORDER — LOSARTAN POTASSIUM 50 MG PO TABS: 50.0000 mg | ORAL_TABLET | Freq: Every day | ORAL | 3 refills | Status: DC

## 2019-09-19 MED ORDER — MONTELUKAST SODIUM 10 MG PO TABS: 10.0000 mg | ORAL_TABLET | Freq: Every day | ORAL | 3 refills | Status: DC

## 2019-09-19 MED ORDER — METOPROLOL SUCCINATE ER 25 MG PO TB24: 25.0000 mg | ORAL_TABLET | Freq: Every day | ORAL | 3 refills | Status: DC

## 2019-09-19 MED ORDER — PANTOPRAZOLE SODIUM 20 MG PO TBEC: 20.0000 mg | DELAYED_RELEASE_TABLET | Freq: Every day | ORAL | 3 refills | Status: DC

## 2019-09-19 NOTE — Progress Notes (Signed)
Chief Complaint  Patient presents with  . Annual Exam     Well Woman Tonya [name] is here for a complete physical.   Her last physical was >1 year ago.  Current diet: in general, diet could be better. Current exercise: was walking. Weight is decreased and she denies daytime fatigue. Seatbelt? Yes  Health Maintenance Colonoscopy- Yes Shingrix- No DEXA- Yes Mammogram- Yes Tetanus- Yes Pneumonia- Due Hep C screen- Yes   Patient has a longstanding history of bilateral knee pain.  He has gotten worse over the past 1-2 months.  No injury or change in activity.  Hurts on the outside.  Radiates up to the hips.  No redness, bruising, swelling.  No neurologic signs or symptoms.  She has not tried anything so far.  Past Medical History:  Diagnosis Date  . Hypertension   . Osteopenia      Past Surgical History:  Procedure Laterality Date  . ABDOMINAL HYSTERECTOMY  2015  . MASTECTOMY Left   . TONSILLECTOMY AND ADENOIDECTOMY  1958    Medications  Current Outpatient Medications on File Prior to Visit  Medication Sig Dispense Refill  . calcium-vitamin D (OSCAL WITH D) 250-125 MG-UNIT tablet Take 1 tablet by mouth daily.    Marland Kitchen losartan (COZAAR) 50 MG tablet TAKE 1 TABLET BY MOUTH  DAILY 90 tablet 3  . metoprolol succinate (TOPROL-XL) 25 MG 24 hr tablet TAKE 1 TABLET BY MOUTH  DAILY 90 tablet 3  . montelukast (SINGULAIR) 10 MG tablet TAKE 1 TABLET BY MOUTH AT  BEDTIME 90 tablet 3  . pantoprazole (PROTONIX) 20 MG tablet TAKE 1 TABLET BY MOUTH  DAILY 90 tablet 1  . tinidazole (TINDAMAX) 500 MG tablet Take 2 tablets (1,000 mg total) by mouth daily with breakfast for 5 days. 10 tablet 0  . valACYclovir (VALTREX) 1000 MG tablet Take 1 tablet (1,000 mg total) by mouth 2 (two) times daily. 30 tablet 4    Allergies No Known Allergies  Review of Systems: Constitutional:  no fevers Eye:  no recent significant change in vision Ears:  No changes in hearing Nose/Mouth/Throat:  no complaints  of nasal congestion, no sore throat Cardiovascular: no chest pain Respiratory:  No shortness of breath Gastrointestinal:  No change in bowel habits GU:  Female: + Discharge and dysuria Integumentary:  no abnormal skin lesions reported Neurologic:  no headaches Endocrine:  denies unexplained weight changes  Exam BP 122/84 (BP Location: Right Arm, Patient Position: Sitting, Cuff Size: Normal)   Pulse (!) 108   Temp (!) 96.3 F (35.7 C) (Temporal)   Ht 5\' 7"  (1.702 m)   Wt 220 lb (99.8 kg)   SpO2 97%   BMI 34.46 kg/m  General:  well developed, well nourished, in no apparent distress Skin:  no significant moles, warts, or growths Head:  no masses, lesions, or tenderness Eyes:  pupils equal and round, sclera anicteric without injection Ears:  canals without lesions, TMs shiny without retraction, no obvious effusion, no erythema Nose:  nares patent, septum midline, mucosa normal, and no drainage or sinus tenderness Throat/Pharynx:  lips and gingiva without lesion; tongue and uvula midline; non-inflamed pharynx; no exudates or postnasal drainage Neck: neck supple without adenopathy, thyromegaly, or masses Lungs:  clear to auscultation, breath sounds equal bilaterally, no respiratory distress Cardio:  regular rate and rhythm, no bruits or LE edema MSK: Tenderness to palpation of the bilateral greater trochanteric bursa; mild tenderness over the distal IT band near the knee bilaterally; there is no  joint line tenderness or bony tenderness around the knee.  There is no effusion, negative Apley grind, varus/valgus stress, Lachman's bilaterally. Abdomen:  abdomen soft, nontender; bowel sounds normal; no masses or organomegaly Genital: Deferred Neuro:  gait normal; deep tendon reflexes normal and symmetric Psych: well oriented with normal range of affect and appropriate judgment/insight  Assessment and Plan  Well adult exam - Plan: Lipid panel  Chronic pain of both knees  Iliotibial band  syndrome, unspecified laterality  Gastroesophageal reflux disease - Plan: pantoprazole (PROTONIX) 20 MG tablet  Mild intermittent asthma without complication - Plan: montelukast (SINGULAIR) 10 MG tablet  Essential hypertension - Plan: metoprolol succinate (TOPROL-XL) 25 MG 24 hr tablet, losartan (COZAAR) 50 MG tablet  Need for vaccination against Streptococcus pneumoniae - Plan: Pneumococcal polysaccharide vaccine 23-valent greater than or equal to 2yo subcutaneous/IM   Well 69 y.o. female. Counseled on diet and exercise. Other orders as above. Follow up in 1 month to recheck knees and hips.  She was given stretches and exercises for the IT band and balancing the strength/musculature of the knee.  She likely needs to strengthen her vastus lateralis bilaterally in addition to stretch out the IT band.  Will consider injections versus physical therapy next visit if no improvement. The patient voiced understanding and agreement to the plan.  [place], DO 09/19/19 8:13 PM

## 2019-09-19 NOTE — Patient Instructions (Addendum)
Keep the diet clean and stay active. Consider yoga.   Give [place] 2-3 business days to get the results of your labs back.   Rephresh is over the counter. I think it is worth trying.  Let us know if you need anything.  Iliotibial Band Syndrome Rehab It is normal to feel mild stretching, pulling, tightness, or discomfort as you do these exercises, but you should stop right away if you feel sudden pain or your pain gets worse.  Stretching and range of motion exercises These exercises warm up your muscles and joints and improve the movement and flexibility of your hip and pelvis. Exercise A: Quadriceps, prone    1. Lie on your abdomen on a firm surface, such as a bed or padded floor. 2. Bend your left / right knee and hold your ankle. If you cannot reach your ankle or pant leg, loop a belt around your foot and grab the belt instead. 3. Gently pull your heel toward your buttocks. Your knee should not slide out to the side. You should feel a stretch in the front of your thigh and knee. 4. Hold this position for 30 seconds. Repeat 2 times. Complete this stretch 3 times per week. Exercise B: Iliotibial band    1. Lie on your side with your left / right leg in the top position. 2. Bend both of your knees and grab your left / right ankle. Stretch out your bottom arm to help you balance. 3. Slowly bring your top knee back so your thigh goes behind your trunk. 4. Slowly lower your top leg toward the floor until you feel a gentle stretch on the outside of your left / right hip and thigh. If you do not feel a stretch and your knee will not fall farther, place the heel of your other foot on top of your knee and pull your knee down toward the floor with your foot. 5. Hold this position for 30 seconds. Repeat 2 times. Complete this stretch 3 times per week. Strengthening exercises These exercises build strength and endurance in your hip and pelvis. Endurance is the ability to use your muscles for a long  time, even after they get tired. Exercise C: Straight leg raises (hip abductors)     1. Lie on your side with your left / right leg in the top position. Lie so your head, shoulder, knee, and hip line up. You may bend your bottom knee to help you balance. 2. Roll your hips slightly forward so your hips are stacked directly over each other and your left / right knee is facing forward. 3. Tense the muscles in your outer thigh and lift your top leg 4-6 inches (10-15 cm). 4. Hold this position for 3 seconds. Repeat for a total of 10 reps. 5. Slowly return to the starting position. Let your muscles relax completely before doing another repetition. Repeat 2 times. Complete this exercise 3 times per week. Exercise D: Straight leg raises (hip extensors) 1. Lie on your abdomen on your bed or a firm surface. You can put a pillow under your hips if that is more comfortable. 2. Bend your left / right knee so your foot is straight up in the air. 3. Squeeze your buttock muscles and lift your left / right thigh off the bed. Do not let your back arch. 4. Tense this muscle as hard as you can without increasing any knee pain. 5. Hold this position for 2 seconds. Repeat for a total of  10 reps 6. Slowly lower your leg to the starting position and allow it to relax completely. Repeat 2 times. Complete this exercise 3 times per week. Exercise E: Hip hike 1. Stand sideways on a bottom step. Stand on your left / right leg with your other foot unsupported next to the step. You can hold onto the railing or wall if needed for balance. 2. Keep your knees straight and your torso square. Then, lift your left / right hip up toward the ceiling. 3. Slowly let your left / right hip lower toward the floor, past the starting position. Your foot should get closer to the floor. Do not lean or bend your knees. Repeat 2 times. Complete this exercise 3 times per week.  Document Released: 06/08/2005 Document Revised: 02/11/2016  Document Reviewed: 05/10/2015 Elsevier Interactive Patient Education  2018 [employer].    Knee Exercises It is normal to feel mild stretching, pulling, tightness, or discomfort as you do these exercises, but you should stop right away if you feel sudden pain or your pain gets worse. STRETCHING AND RANGE OF MOTION EXERCISES  These exercises warm up your muscles and joints and improve the movement and flexibility of your knee. These exercises also help to relieve pain, numbness, and tingling. Exercise A: Knee Extension, Prone  1. Lie on your abdomen on a bed. 2. Place your left / right knee just beyond the edge of the surface so your knee is not on the bed. You can put a towel under your left / right thigh just above your knee for comfort. 3. Relax your leg muscles and allow gravity to straighten your knee. You should feel a stretch behind your left / right knee. 4. Hold this position for 30 seconds. 5. Scoot up so your knee is supported between repetitions. Repeat 2 times. Complete this stretch 3 times per week. Exercise B: Knee Flexion, Active    1. Lie on your back with both knees straight. If this causes back discomfort, bend your left / right knee so your foot is flat on the floor. 2. Slowly slide your left / right heel back toward your buttocks until you feel a gentle stretch in the front of your knee or thigh. 3. Hold this position for 30 seconds. 4. Slowly slide your left / right heel back to the starting position. Repeat 2 times. Complete this exercise 3 times per week. Exercise C: Quadriceps, Prone    1. Lie on your abdomen on a firm surface, such as a bed or padded floor. 2. Bend your left / right knee and hold your ankle. If you cannot reach your ankle or pant leg, loop a belt around your foot and grab the belt instead. 3. Gently pull your heel toward your buttocks. Your knee should not slide out to the side. You should feel a stretch in the front of your thigh and  knee. 4. Hold this position for 30 seconds. Repeat 2 times. Complete this stretch 3 times per week. Exercise D: Hamstring, Supine  1. Lie on your back. 2. Loop a belt or towel over the ball of your left / right foot. The ball of your foot is on the walking surface, right under your toes. 3. Straighten your left / right knee and slowly pull on the belt to raise your leg until you feel a gentle stretch behind your knee. ? Do not let your left / right knee bend while you do this. ? Keep your other leg flat on  the floor. 4. Hold this position for 30 seconds. Repeat 2 times. Complete this stretch 3 times per week. STRENGTHENING EXERCISES  These exercises build strength and endurance in your knee. Endurance is the ability to use your muscles for a long time, even after they get tired. Exercise E: Quadriceps, Isometric    1. Lie on your back with your left / right leg extended and your other knee bent. Put a rolled towel or small pillow under your knee if told by your health care provider. 2. Slowly tense the muscles in the front of your left / right thigh. You should see your kneecap slide up toward your hip or see increased dimpling just above the knee. This motion will push the back of the knee toward the floor. 3. For 3 seconds, keep the muscle as tight as you can without increasing your pain. 4. Relax the muscles slowly and completely. Repeat for 10 total reps Repeat 2 ti mes. Complete this exercise 3 times per week. Exercise F: Straight Leg Raises - Quadriceps  1. Lie on your back with your left / right leg extended and your other knee bent. 2. Tense the muscles in the front of your left / right thigh. You should see your kneecap slide up or see increased dimpling just above the knee. Your thigh may even shake a bit. 3. Keep these muscles tight as you raise your leg 4-6 inches (10-15 cm) off the floor. Do not let your knee bend. 4. Hold this position for 3 seconds. 5. Keep these muscles  tense as you lower your leg. 6. Relax your muscles slowly and completely after each repetition. 10 total reps. Repeat 2 times. Complete this exercise 3 times per week.  Exercise G: Hamstring Curls    If told by your health care provider, do this exercise while wearing ankle weights. Begin with 5 lb weights (optional). Then increase the weight by 1 lb (0.5 kg) increments. Do not wear ankle weights that are more than 20 lbs to start with. 1. Lie on your abdomen with your legs straight. 2. Bend your left / right knee as far as you can without feeling pain. Keep your hips flat against the floor. 3. Hold this position for 3 seconds. 4. Slowly lower your leg to the starting position. Repeat for 10 reps.  Repeat 2 times. Complete this exercise 3 times per week. Exercise H: Squats (Quadriceps)  1. Stand in front of a table, with your feet and knees pointing straight ahead. You may rest your hands on the table for balance but not for support. 2. Slowly bend your knees and lower your hips like you are going to sit in a chair. ? Keep your weight over your heels, not over your toes. ? Keep your lower legs upright so they are parallel with the table legs. ? Do not let your hips go lower than your knees. ? Do not bend lower than told by your health care provider. ? If your knee pain increases, do not bend as low. 3. Hold the squat position for 1 second. 4. Slowly push with your legs to return to standing. Do not use your hands to pull yourself to standing. Repeat 2 times. Complete this exercise 3 times per week. Exercise I: Wall Slides (Quadriceps)    1. Lean your back against a smooth wall or door while you walk your feet out 18-24 inches (46-61 cm) from it. 2. Place your feet hip-width apart. 3. Slowly slide down the  wall or door until your knees Repeat 2 times. Complete this exercise every other day. 4. Exercise K: Straight Leg Raises - Hip Abductors  1. Lie on your side with your left / right  leg in the top position. Lie so your head, shoulder, knee, and hip line up. You may bend your bottom knee to help you keep your balance. 2. Roll your hips slightly forward so your hips are stacked directly over each other and your left / right knee is facing forward. 3. Leading with your heel, lift your top leg 4-6 inches (10-15 cm). You should feel the muscles in your outer hip lifting. ? Do not let your foot drift forward. ? Do not let your knee roll toward the ceiling. 4. Hold this position for 3 seconds. 5. Slowly return your leg to the starting position. 6. Let your muscles relax completely after each repetition. 10 total reps. Repeat 2 times. Complete this exercise 3 times per week. Exercise J: Straight Leg Raises - Hip Extensors  1. Lie on your abdomen on a firm surface. You can put a pillow under your hips if that is more comfortable. 2. Tense the muscles in your buttocks and lift your left / right leg about 4-6 inches (10-15 cm). Keep your knee straight as you lift your leg. 3. Hold this position for 3 seconds. 4. Slowly lower your leg to the starting position. 5. Let your leg relax completely after each repetition. Repeat 2 times. Complete this exercise 3 times per week. Document Released: 04/22/2005 Document Revised: 03/02/2016 Document Reviewed: 04/14/2015 Elsevier Interactive Patient Education  2017 [employer].

## 2019-09-21 ENCOUNTER — Other Ambulatory Visit: Payer: Self-pay

## 2019-09-21 ENCOUNTER — Encounter: Payer: Self-pay | Admitting: Family Medicine

## 2019-09-21 ENCOUNTER — Ambulatory Visit (INDEPENDENT_AMBULATORY_CARE_PROVIDER_SITE_OTHER): Payer: Managed Care, Other (non HMO) | Admitting: Family Medicine

## 2019-09-21 VITALS — BP 114/60 | HR 86 | Ht 67.0 in | Wt 221.0 lb

## 2019-09-21 DIAGNOSIS — N766 Ulceration of vulva: Secondary | ICD-10-CM | POA: Diagnosis not present

## 2019-09-21 MED ORDER — AZITHROMYCIN 500 MG PO TABS: 1000.0000 mg | ORAL_TABLET | Freq: Once | ORAL | 0 refills | Status: AC

## 2019-09-21 MED ORDER — CLOTRIMAZOLE-BETAMETHASONE 1-0.05 % EX CREA: 1.0000 "application " | TOPICAL_CREAM | Freq: Two times a day (BID) | CUTANEOUS | 0 refills | Status: DC

## 2019-09-21 NOTE — Progress Notes (Signed)
   Subjective:    Patient ID: Tonya [name], female    DOB: 05-Feb-1951, 69 y.o.   MRN: [id number]  HPI Patient seen for follow up of vaginal irritation. Has burning and irritation just above urethra, which is painful when she urinates. Also has sensitive area on posterior area of introitus. Has been treated multiple times for BV and yeast.  Does have history of HSV, but took valtrex without improvement.  Review of Systems     Objective:   Physical Exam Vitals reviewed. Exam conducted with a chaperone present.  Constitutional:      Appearance: Normal appearance.  Genitourinary:   Neurological:     Mental Status: She is alert.        Assessment & Plan:  1. Genital ulcer, female Treat for chancroid. Has taken valtrex, so less likely HSV. GC/CT neg. Check RPR and HIV as husband had an affair a few months ago. - RPR - HIV antibody (with reflex)

## 2019-09-22 LAB — HIV ANTIBODY (ROUTINE TESTING W REFLEX): HIV Screen 4th Generation wRfx: NONREACTIVE

## 2019-09-22 LAB — RPR: RPR Ser Ql: NONREACTIVE

## 2019-09-25 ENCOUNTER — Ambulatory Visit: Payer: 59 | Admitting: Psychology

## 2019-09-29 ENCOUNTER — Ambulatory Visit: Payer: Managed Care, Other (non HMO) | Admitting: Family Medicine

## 2019-10-05 ENCOUNTER — Ambulatory Visit (INDEPENDENT_AMBULATORY_CARE_PROVIDER_SITE_OTHER): Payer: Managed Care, Other (non HMO) | Admitting: Family Medicine

## 2019-10-05 ENCOUNTER — Encounter: Payer: Self-pay | Admitting: Family Medicine

## 2019-10-05 ENCOUNTER — Other Ambulatory Visit: Payer: Self-pay

## 2019-10-05 VITALS — BP 116/71 | HR 87 | Ht 67.0 in | Wt 219.0 lb

## 2019-10-05 DIAGNOSIS — N949 Unspecified condition associated with female genital organs and menstrual cycle: Secondary | ICD-10-CM | POA: Diagnosis not present

## 2019-10-05 DIAGNOSIS — N766 Ulceration of vulva: Secondary | ICD-10-CM

## 2019-10-05 MED ORDER — CLOTRIMAZOLE-BETAMETHASONE 1-0.05 % EX CREA: 1.0000 "application " | TOPICAL_CREAM | CUTANEOUS | 3 refills | Status: DC

## 2019-10-05 NOTE — Progress Notes (Signed)
  Subjective:    Patient ID: Tonya [name], female    DOB: 09/19/1950, 69 y.o.   MRN: [id number]  HPI  Patient seen for follow up of vaginal irritation and genital ulcer. Feels like things are much better after the antibiotics. Has also been using the lotrisone BID.  Review of Systems     Objective:   Physical Exam Vitals reviewed. Exam conducted with a chaperone present.  Constitutional:      Appearance: Normal appearance.  Genitourinary:   Skin:    Capillary Refill: Capillary refill takes less than 2 seconds.  Neurological:     Mental Status: She is alert.  Psychiatric:        Mood and Affect: Mood normal.        Behavior: Behavior normal.        Thought Content: Thought content normal.        Judgment: Judgment normal.        Assessment & Plan:  1. Vaginal burning Less irritation. Decrease lotrisone to three times weekly  2. Genital ulcer, female Improving. Will continue to follow.

## 2019-10-05 NOTE — Progress Notes (Signed)
Follow up vaginal sore. Kathrene Alu RN

## 2019-10-09 ENCOUNTER — Ambulatory Visit: Payer: 59 | Admitting: Psychology

## 2019-10-20 ENCOUNTER — Ambulatory Visit: Payer: Managed Care, Other (non HMO) | Admitting: Family Medicine

## 2019-10-23 ENCOUNTER — Ambulatory Visit: Payer: 59 | Admitting: Psychology

## 2019-11-06 ENCOUNTER — Ambulatory Visit: Payer: 59 | Admitting: Psychology

## 2020-01-04 ENCOUNTER — Other Ambulatory Visit (HOSPITAL_COMMUNITY)
Admission: RE | Admit: 2020-01-04 | Discharge: 2020-01-04 | Disposition: A | Discharge status: Home / Self Care | Payer: Managed Care, Other (non HMO) | Source: Ambulatory Visit | Attending: Family Medicine | Admitting: Family Medicine

## 2020-01-04 ENCOUNTER — Other Ambulatory Visit: Payer: Self-pay

## 2020-01-04 ENCOUNTER — Ambulatory Visit (INDEPENDENT_AMBULATORY_CARE_PROVIDER_SITE_OTHER): Payer: Managed Care, Other (non HMO) | Admitting: Family Medicine

## 2020-01-04 ENCOUNTER — Other Ambulatory Visit: Payer: Self-pay | Admitting: Family Medicine

## 2020-01-04 ENCOUNTER — Encounter: Payer: Self-pay | Admitting: Family Medicine

## 2020-01-04 VITALS — BP 115/61 | HR 79 | Ht 67.0 in | Wt 222.0 lb

## 2020-01-04 DIAGNOSIS — N644 Mastodynia: Secondary | ICD-10-CM

## 2020-01-04 DIAGNOSIS — Z01419 Encounter for gynecological examination (general) (routine) without abnormal findings: Secondary | ICD-10-CM | POA: Diagnosis not present

## 2020-01-04 DIAGNOSIS — N898 Other specified noninflammatory disorders of vagina: Secondary | ICD-10-CM | POA: Diagnosis not present

## 2020-01-04 DIAGNOSIS — Z1231 Encounter for screening mammogram for malignant neoplasm of breast: Secondary | ICD-10-CM | POA: Diagnosis not present

## 2020-01-04 NOTE — Progress Notes (Signed)
Patient states she thinks she may have bacterial infection. Patient having an odor. Kathrene Alu RN

## 2020-01-04 NOTE — Addendum Note (Signed)
Addended by: Phill Myron on: 01/04/2020 10:27 AM   Modules accepted: Orders

## 2020-01-04 NOTE — Progress Notes (Signed)
GYNECOLOGY ANNUAL PREVENTATIVE CARE ENCOUNTER NOTE  Subjective:   Tonya [name] is a 69 y.o. G52P1001 female here for a routine annual gynecologic exam.  Current complaints: discharge with increased odor.   Denies abnormal vaginal bleeding, discharge, pelvic pain, problems with intercourse or other gynecologic concerns.    Gynecologic History No LMP recorded. Patient is postmenopausal. Patient is sexually active  Contraception: status post hysterectomy Last Pap: n/a Last mammogram: 02/2019.   Obstetric History OB History  Gravida Para Term Preterm AB Living  1 1 1     1   SAB TAB Ectopic Multiple Live Births          1    # Outcome Date GA Lbr Len/2nd Weight Sex Delivery Anes PTL Lv  1 Term 1986 [redacted]w[redacted]d   F Vag-Spont None N LIV    Past Medical History:  Diagnosis Date  . Hypertension   . Osteopenia     Past Surgical History:  Procedure Laterality Date  . ABDOMINAL HYSTERECTOMY  2015  . BREAST IMPLANT EXCHANGE    . MASTECTOMY Left   . TONSILLECTOMY AND ADENOIDECTOMY  1958    Current Outpatient Medications on File Prior to Visit  Medication Sig Dispense Refill  . calcium-vitamin D (OSCAL WITH D) 250-125 MG-UNIT tablet Take 1 tablet by mouth daily.    . clotrimazole-betamethasone (LOTRISONE) cream Apply 1 application topically 3 (three) times a week. 30 g 3  . losartan (COZAAR) 50 MG tablet Take 1 tablet (50 mg total) by mouth daily. 90 tablet 3  . metoprolol succinate (TOPROL-XL) 25 MG 24 hr tablet Take 1 tablet (25 mg total) by mouth daily. 90 tablet 3  . montelukast (SINGULAIR) 10 MG tablet Take 1 tablet (10 mg total) by mouth at bedtime. 90 tablet 3  . pantoprazole (PROTONIX) 20 MG tablet Take 1 tablet (20 mg total) by mouth daily. 90 tablet 3  . valACYclovir (VALTREX) 1000 MG tablet Take 1 tablet (1,000 mg total) by mouth 2 (two) times daily. 30 tablet 4   No current facility-administered medications on file prior to visit.    No Known Allergies  Social History     Socioeconomic History  . Marital status: Married    Spouse name: Not on file  . Number of children: Not on file  . Years of education: Not on file  . Highest education level: Not on file  Occupational History  . Not on file  Tobacco Use  . Smoking status: Never Smoker  . Smokeless tobacco: Never Used  Vaping Use  . Vaping Use: Never used  Substance and Sexual Activity  . Alcohol use: Not on file    Comment: rarely  . Drug use: Never  . Sexual activity: Yes    Birth control/protection: None  Other Topics Concern  . Not on file  Social History Narrative  . Not on file   Social Determinants of Health   Financial Resource Strain:   . Difficulty of Paying Living Expenses:   Food Insecurity:   . Worried About Charity fundraiser in the Last Year:   . Arboriculturist in the Last Year:   Transportation Needs:   . Film/video editor (Medical):   Marland Kitchen Lack of Transportation (Non-Medical):   Physical Activity:   . Days of Exercise per Week:   . Minutes of Exercise per Session:   Stress:   . Feeling of Stress :   Social Connections:   . Frequency of Communication with Friends  and Family:   . Frequency of Social Gatherings with Friends and Family:   . Attends Religious Services:   . Active Member of Clubs or Organizations:   . Attends Archivist Meetings:   Marland Kitchen Marital Status:   Intimate Partner Violence:   . Fear of Current or Ex-Partner:   . Emotionally Abused:   Marland Kitchen Physically Abused:   . Sexually Abused:     Family History  Problem Relation Age of Onset  . Heart attack Father        died from    The following portions of the patient's history were reviewed and updated as appropriate: allergies, current medications, past family history, past medical history, past social history, past surgical history and problem list.  Review of Systems Pertinent items are noted in HPI.   Objective:  BP 115/61   Pulse 79   Ht 5\' 7"  (1.702 m)   Wt 222 lb (100.7 kg)    BMI 34.77 kg/m  Wt Readings from Last 3 Encounters:  01/04/20 222 lb (100.7 kg)  10/05/19 219 lb (99.3 kg)  09/21/19 221 lb 0.6 oz (100.3 kg)     Chaperone present during exam  CONSTITUTIONAL: Well-developed, well-nourished female in no acute distress.  HENT:  Normocephalic, atraumatic, External right and left ear normal. Oropharynx is clear and moist EYES: Conjunctivae and EOM are normal. Pupils are equal, round, and reactive to light. No scleral icterus.  NECK: Normal range of motion, supple, no masses.  Normal thyroid.   CARDIOVASCULAR: Normal heart rate noted, regular rhythm RESPIRATORY: Clear to auscultation bilaterally. Effort and breath sounds normal, no problems with respiration noted. BREASTS: S/p left mastectomy and implant placement. Symmetric in size. No masses, skin changes, nipple drainage, or lymphadenopathy. ABDOMEN: Soft, normal bowel sounds, no distention noted.  No tenderness, rebound or guarding.  PELVIC: Normal appearing external genitalia; normal appearing vaginal mucosa.  Slight white thin discharge noted. Previous ulceration resolved. MUSCULOSKELETAL: Normal range of motion. No tenderness.  No cyanosis, clubbing, or edema.  2+ distal pulses. SKIN: Skin is warm and dry. No rash noted. Not diaphoretic. No erythema. No pallor. NEUROLOGIC: Alert and oriented to person, place, and time. Normal reflexes, muscle tone coordination. No cranial nerve deficit noted. PSYCHIATRIC: Normal mood and affect. Normal behavior. Normal judgment and thought content.  Assessment:  Annual gynecologic examination with pap smear   Plan:  1. Well Woman Exam Mammogram scheduled STD testing discussed. Patient declined testing  2. Encounter for screening mammogram for malignant neoplasm of breast - MM Digital Screening; Future  3. Vaginal discharge - Cervicovaginal ancillary only( [place])   Routine preventative health maintenance measures emphasized. Please refer to After  Visit Summary for other counseling recommendations.    Loma Boston, [place] for [employer]

## 2020-01-05 ENCOUNTER — Ambulatory Visit (INDEPENDENT_AMBULATORY_CARE_PROVIDER_SITE_OTHER): Payer: Managed Care, Other (non HMO) | Admitting: Family Medicine

## 2020-01-05 ENCOUNTER — Encounter: Payer: Self-pay | Admitting: Family Medicine

## 2020-01-05 ENCOUNTER — Other Ambulatory Visit: Payer: Self-pay | Admitting: Family Medicine

## 2020-01-05 VITALS — BP 120/78 | HR 97 | Temp 98.5°F | Ht 67.0 in | Wt 222.0 lb

## 2020-01-05 DIAGNOSIS — M79605 Pain in left leg: Secondary | ICD-10-CM

## 2020-01-05 DIAGNOSIS — S46812A Strain of other muscles, fascia and tendons at shoulder and upper arm level, left arm, initial encounter: Secondary | ICD-10-CM | POA: Diagnosis not present

## 2020-01-05 DIAGNOSIS — E669 Obesity, unspecified: Secondary | ICD-10-CM | POA: Diagnosis not present

## 2020-01-05 DIAGNOSIS — Z6834 Body mass index (BMI) 34.0-34.9, adult: Secondary | ICD-10-CM

## 2020-01-05 DIAGNOSIS — M79604 Pain in right leg: Secondary | ICD-10-CM

## 2020-01-05 LAB — CERVICOVAGINAL ANCILLARY ONLY
Bacterial Vaginitis (gardnerella): POSITIVE — AB
Candida Glabrata: NEGATIVE
Candida Vaginitis: NEGATIVE
Comment: NEGATIVE
Comment: NEGATIVE
Comment: NEGATIVE

## 2020-01-05 MED ORDER — TINIDAZOLE 500 MG PO TABS: 2.0000 g | ORAL_TABLET | Freq: Every day | ORAL | 2 refills | Status: DC

## 2020-01-05 MED ORDER — MELOXICAM 15 MG PO TABS: 15.0000 mg | ORAL_TABLET | Freq: Every day | ORAL | 0 refills | Status: DC

## 2020-01-05 NOTE — Patient Instructions (Addendum)
Heat (pad or rice pillow in microwave) over affected area, 10-15 minutes twice daily.   OK to take Tylenol 1000 mg (2 extra strength tabs) or 975 mg (3 regular strength tabs) every 6 hours as needed.  Aim to do some physical exertion for 150 minutes per week. This is typically divided into 5 days per week, 30 minutes per day. The activity should be enough to get your heart rate up. Anything is better than nothing if you have time constraints. Consider Tai Chi and yoga on YouTube to help.   Losing weight will help. Let me know if you want to see a dietician or our medical weight loss team.  The following are optional supplements with scant data to support their use universally for joint pain: Glucosamine, turmeric, krill oil.   Trapezius stretches/exercises Do exercises exactly as told by your health care provider and adjust them as directed. It is normal to feel mild stretching, pulling, tightness, or discomfort as you do these exercises, but you should stop right away if you feel sudden pain or your pain gets worse.  Stretching and range of motion exercises These exercises warm up your muscles and joints and improve the movement and flexibility of your shoulder. These exercises can also help to relieve pain, numbness, and tingling. If you are unable to do any of the following for any reason, do not further attempt to do it.   Exercise A: Flexion, standing    1. Stand and hold a broomstick, a cane, or a similar object. Place your hands a little more than shoulder-width apart on the object. Your left / right hand should be palm-up, and your other hand should be palm-down. 2. Push the stick to raise your left / right arm out to your side and then over your head. Use your other hand to help move the stick. Stop when you feel a stretch in your shoulder, or when you reach the angle that is recommended by your health care provider. ? Avoid shrugging your shoulder while you raise your arm. Keep your  shoulder blade tucked down toward your spine. 3. Hold for 30 seconds. 4. Slowly return to the starting position. Repeat 2 times. Complete this exercise 3 times per week.  Exercise B: Abduction, supine    1. Lie on your back and hold a broomstick, a cane, or a similar object. Place your hands a little more than shoulder-width apart on the object. Your left / right hand should be palm-up, and your other hand should be palm-down. 2. Push the stick to raise your left / right arm out to your side and then over your head. Use your other hand to help move the stick. Stop when you feel a stretch in your shoulder, or when you reach the angle that is recommended by your health care provider. ? Avoid shrugging your shoulder while you raise your arm. Keep your shoulder blade tucked down toward your spine. 3. Hold for 30 seconds. 4. Slowly return to the starting position. Repeat 2 times. Complete this exercise 3 times per week.  Exercise C: Flexion, active-assisted    1. Lie on your back. You may bend your knees for comfort. 2. Hold a broomstick, a cane, or a similar object. Place your hands about shoulder-width apart on the object. Your palms should face toward your feet. 3. Raise the stick and move your arms over your head and behind your head, toward the floor. Use your healthy arm to help your left /  right arm move farther. Stop when you feel a gentle stretch in your shoulder, or when you reach the angle where your health care provider tells you to stop. 4. Hold for 30 seconds. 5. Slowly return to the starting position. Repeat 2 times. Complete this exercise 3 times per week.  Exercise D: External rotation and abduction    1. Stand in a door frame with one of your feet slightly in front of the other. This is called a staggered stance. 2. Choose one of the following positions as told by your health care provider: ? Place your hands and forearms on the door frame above your head. ? Place your  hands and forearms on the door frame at the height of your head. ? Place your hands on the door frame at the height of your elbows. 3. Slowly move your weight onto your front foot until you feel a stretch across your chest and in the front of your shoulders. Keep your head and chest upright and keep your abdominal muscles tight. 4. Hold for 30 seconds. 5. To release the stretch, shift your weight to your back foot. Repeat 2 times. Complete this stretch 3 times per week.  Strengthening exercises These exercises build strength and endurance in your shoulder. Endurance is the ability to use your muscles for a long time, even after your muscles get tired. Exercise E: Scapular depression and adduction  1. Sit on a stable chair. Support your arms in front of you with pillows, armrests, or a tabletop. Keep your elbows in line with the sides of your body. 2. Gently move your shoulder blades down toward your middle back. Relax the muscles on the tops of your shoulders and in the back of your neck. 3. Hold for 3 seconds. 4. Slowly release the tension and relax your muscles completely before doing this exercise again. Repeat for a total of 10 repetitions. 5. After you have practiced this exercise, try doing the exercise without the arm support. Then, try the exercise while standing instead of sitting. Repeat 2 times. Complete this exercise 3 times per week.  Exercise F: Shoulder abduction, isometric    1. Stand or sit about 4-6 inches (10-15 cm) from a wall with your left / right side facing the wall. 2. Bend your left / right elbow and gently press your elbow against the wall. 3. Increase the pressure slowly until you are pressing as hard as you can without shrugging your shoulder. 4. Hold for 3 seconds. 5. Slowly release the tension and relax your muscles completely. Repeat for a total of 10 repetitions. Repeat 2 times. Complete this exercise 3 times per week.  Exercise G: Shoulder flexion,  isometric    1. Stand or sit about 4-6 inches (10-15 cm) away from a wall with your left / right side facing the wall. 2. Keep your left / right elbow straight and gently press the top of your fist against the wall. Increase the pressure slowly until you are pressing as hard as you can without shrugging your shoulder. 3. Hold for 10-15 seconds. 4. Slowly release the tension and relax your muscles completely. Repeat for a total of 10 repetitions. Repeat 2 times. Complete this exercise 3 times per week.  Exercise H: Internal rotation    1. Sit in a stable chair without armrests, or stand. Secure an exercise band at your left / right side, at elbow height. 2. Place a soft object, such as a folded towel or a small pillow,  under your left / right upper arm so your elbow is a few inches (about 8 cm) away from your side. 3. Hold the end of the exercise band so the band stretches. 4. Keeping your elbow pressed against the soft object under your arm, move your forearm across your body toward your abdomen. Keep your body steady so the movement is only coming from your shoulder. 5. Hold for 3 seconds. 6. Slowly return to the starting position. Repeat for a total of 10 repetitions. Repeat 2 times. Complete this exercise 3 times per week.  Exercise I: External rotation    1. Sit in a stable chair without armrests, or stand. 2. Secure an exercise band at your left / right side, at elbow height. 3. Place a soft object, such as a folded towel or a small pillow, under your left / right upper arm so your elbow is a few inches (about 8 cm) away from your side. 4. Hold the end of the exercise band so the band stretches. 5. Keeping your elbow pressed against the soft object under your arm, move your forearm out, away from your abdomen. Keep your body steady so the movement is only coming from your shoulder. 6. Hold for 3 seconds. 7. Slowly return to the starting position. Repeat for a total of 10  repetitions. Repeat 2 times. Complete this exercise 3 times per week. Exercise J: Shoulder extension  1. Sit in a stable chair without armrests, or stand. Secure an exercise band to a stable object in front of you so the band is at shoulder height. 2. Hold one end of the exercise band in each hand. Your palms should face each other. 3. Straighten your elbows and lift your hands up to shoulder height. 4. Step back, away from the secured end of the exercise band, until the band stretches. 5. Squeeze your shoulder blades together and pull your hands down to the sides of your thighs. Stop when your hands are straight down by your sides. Do not let your hands go behind your body. 6. Hold for 3 seconds. 7. Slowly return to the starting position. Repeat for a total of 10 repetitions. Repeat 2 times. Complete this exercise 3 times per week.  Exercise K: Shoulder extension, prone    1. Lie on your abdomen on a firm surface so your left / right arm hangs over the edge. 2. Hold a 5 lb weight in your hand so your palm faces in toward your body. Your arm should be straight. 3. Squeeze your shoulder blade down toward the middle of your back. 4. Slowly raise your arm behind you, up to the height of the surface that you are lying on. Keep your arm straight. 5. Hold for 3 seconds. 6. Slowly return to the starting position and relax your muscles. Repeat for a total of 10 repetitions. Repeat 2 times. Complete this exercise 3 times per week.   Exercise L: Horizontal abduction, prone  1. Lie on your abdomen on a firm surface so your left / right arm hangs over the edge. 2. Hold a 5 lb weight in your hand so your palm faces toward your feet. Your arm should be straight. 3. Squeeze your shoulder blade down toward the middle of your back. 4. Bend your elbow so your hand moves up, until your elbow is bent to an "L" shape (90 degrees). With your elbow bent, slowly move your forearm forward and up. Raise your hand  up to the height of  the surface that you are lying on. ? Your upper arm should not move, and your elbow should stay bent. ? At the top of the movement, your palm should face the floor. 5. Hold for 3 seconds. 6. Slowly return to the starting position and relax your muscles. Repeat for a total of 10 repetitions. Repeat 2 times. Complete this exercise 3 times per week.  Exercise M: Horizontal abduction, standing  1. Sit on a stable chair, or stand. 2. Secure an exercise band to a stable object in front of you so the band is at shoulder height. 3. Hold one end of the exercise band in each hand. 4. Straighten your elbows and lift your hands straight in front of you, up to shoulder height. Your palms should face down, toward the floor. 5. Step back, away from the secured end of the exercise band, until the band stretches. 6. Move your arms out to your sides, and keep your arms straight. 7. Hold for 3 seconds. 8. Slowly return to the starting position. Repeat for a total of 10 repetitions. Repeat 2 times. Complete this exercise 3 times per week.  Exercise N: Scapular retraction and elevation  1. Sit on a stable chair, or stand. 2. Secure an exercise band to a stable object in front of you so the band is at shoulder height. 3. Hold one end of the exercise band in each hand. Your palms should face each other. 4. Sit in a stable chair without armrests, or stand. 5. Step back, away from the secured end of the exercise band, until the band stretches. 6. Squeeze your shoulder blades together and lift your hands over your head. Keep your elbows straight. 7. Hold for 3 seconds. 8. Slowly return to the starting position. Repeat for a total of 10 repetitions. Repeat 2 times. Complete this exercise 3 times per week.  This information is not intended to replace advice given to you by your health care provider. Make sure you discuss any questions you have with your health care provider. Document Released:  06/08/2005 Document Revised: 02/13/2016 Document Reviewed: 04/25/2015 Elsevier Interactive Patient Education  2017 [employer].

## 2020-01-05 NOTE — Progress Notes (Signed)
Musculoskeletal Exam  Patient: Tonya [name] DOB: [birth year]  DOS: 01/05/2020  SUBJECTIVE:  Chief Complaint:   Chief Complaint  Patient presents with  . Fatigue  . Neck Pain  . Shoulder Pain    Tonya [name] is a 69 y.o.  female for evaluation and treatment of neck/shoulder pain.   Onset:  3 months ago. No inj or change in activity. This did start after she had a replacement of breast implant on the L Location: L trap area Character:  aching and sharp  Progression of issue:  is unchanged Associated symptoms: no bruising, redness, swelling, good ROM of shoulder Treatment: to date has been massage and heat.    Fatigue continues. Sometimes she does snore at night. Sleeps well, wakes up feeling refreshed. She does nap after lunch. Stress has improved, but still bothersome. Having IBS symptoms.   Has pain in both knees/hips. Does not exercise routinely. Diet could be better, she states she eats too many sweets.   Past Medical History:  Diagnosis Date  . Hypertension   . Osteopenia    Objective: VITAL SIGNS: BP 120/78 (BP Location: Left Arm, Patient Position: Sitting, Cuff Size: Normal)   Pulse 97   Temp 98.5 F (36.9 C) (Oral)   Ht _0  (1.702 m)   Wt 222 lb (100.7 kg)   SpO2 98%   BMI 34.77 kg/m  Constitutional: Well formed, well developed. No acute distress. Thorax & Lungs: No accessory muscle use Musculoskeletal: L trap   Normal active range of motion: yes.   Normal passive range of motion: yes Tenderness to palpation: yes over trap Deformity: no Ecchymosis: no Neurologic: Normal sensory function. No focal deficits noted. DTR's equal and symmetric in UE's. No clonus. Psychiatric: Normal mood. Age appropriate judgment and insight. Alert & oriented x 3.    Assessment:  Strain of left trapezius muscle, initial encounter - Plan: meloxicam (MOBIC) 15 MG tablet  Class 1 obesity without serious comorbidity with body mass index (BMI) of 34.0 to 34.9 in adult,  unspecified obesity type  Pain in both lower extremities  Plan: 1. Stretches/exercises, heat, ice, Tylenol.   2. Counseled on diet/exercise. Offered referral to dietician and [facility]. She will let us know if she would like ot pursue. 3. Yoga/tai chair, wt loss rec'd. Consider glucosamine, krill oil, turmeric.  F/u in 1 mo prn. The patient voiced understanding and agreement to the plan.   [place], DO 01/05/20  9:59 AM

## 2020-01-08 ENCOUNTER — Other Ambulatory Visit: Payer: Self-pay

## 2020-01-08 ENCOUNTER — Telehealth: Payer: Self-pay

## 2020-01-08 DIAGNOSIS — B9689 Other specified bacterial agents as the cause of diseases classified elsewhere: Secondary | ICD-10-CM

## 2020-01-08 MED ORDER — METRONIDAZOLE 500 MG PO TABS: 500.0000 mg | ORAL_TABLET | Freq: Two times a day (BID) | ORAL | 0 refills | Status: DC

## 2020-01-08 MED ORDER — BORIC ACID CRYS: 600.0000 mg | CRYSTALS | Freq: Every day | 5 refills | Status: DC

## 2020-01-08 NOTE — Telephone Encounter (Signed)
Patient called and wanted to clarification. Patient states that flagyl was called in for her this morning but Dr. Nehemiah Settle had already sent in tindamax for her over th weekend. Patient states she didn't start that because it was her birthday (and the medication is to not be taken with alcohol). Patient started it this morning.  Spoke with patient at length about her recurrent BV and patient wondering if it has to do with her hysterectomy. Made her aware to not use any fragrances, do not douche, and wear cotton underwear. She states she is following all these advises. I also commented on if she was in any counseling (hx of infedelitilty) Patient states that she was seeing someone but the counselor just said she should move on from the past. Informed her a counselor she give her techniques for helping with coping, and anxiety. patient states she will seek out a new counselor. Kathrene Alu RN

## 2020-01-10 ENCOUNTER — Other Ambulatory Visit: Payer: Self-pay

## 2020-01-10 ENCOUNTER — Ambulatory Visit
Admission: RE | Admit: 2020-01-10 | Discharge: 2020-01-10 | Disposition: A | Discharge status: Home / Self Care | Payer: Managed Care, Other (non HMO) | Source: Ambulatory Visit | Attending: Family Medicine | Admitting: Family Medicine

## 2020-01-10 DIAGNOSIS — N644 Mastodynia: Secondary | ICD-10-CM

## 2020-01-10 IMAGING — US US BREAST*R* LIMITED INC AXILLA
1 series · 6 of 6 positions shown · non-contrast
Comparison: Previous exam(s).

CLINICAL DATA: Patient presents for evaluation of focal tenderness
within the outer right breast. Patient has history of prior left
mastectomy.

EXAM:
DIGITAL DIAGNOSTIC RIGHT MAMMOGRAM WITH CAD AND TOMO
ULTRASOUND RIGHT BREAST

[Series 1: us breast*right* limited inc axilla · 0.07mm/px · 6 of 6 slices shown]
[im 1/6]
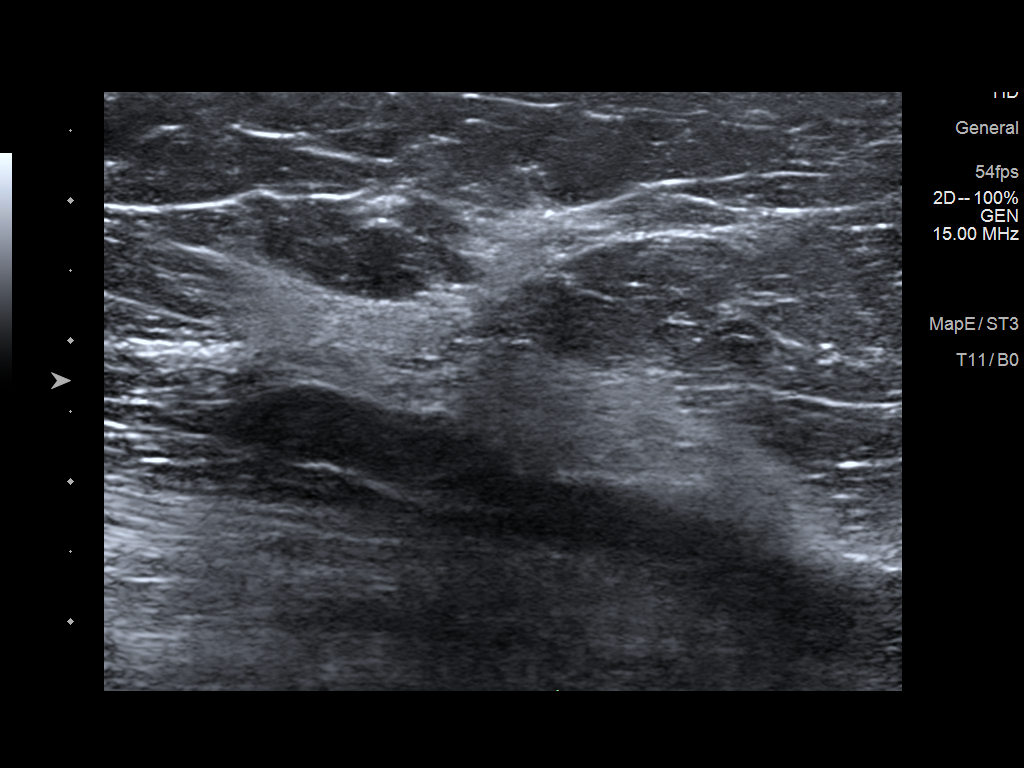
[im 2/6]
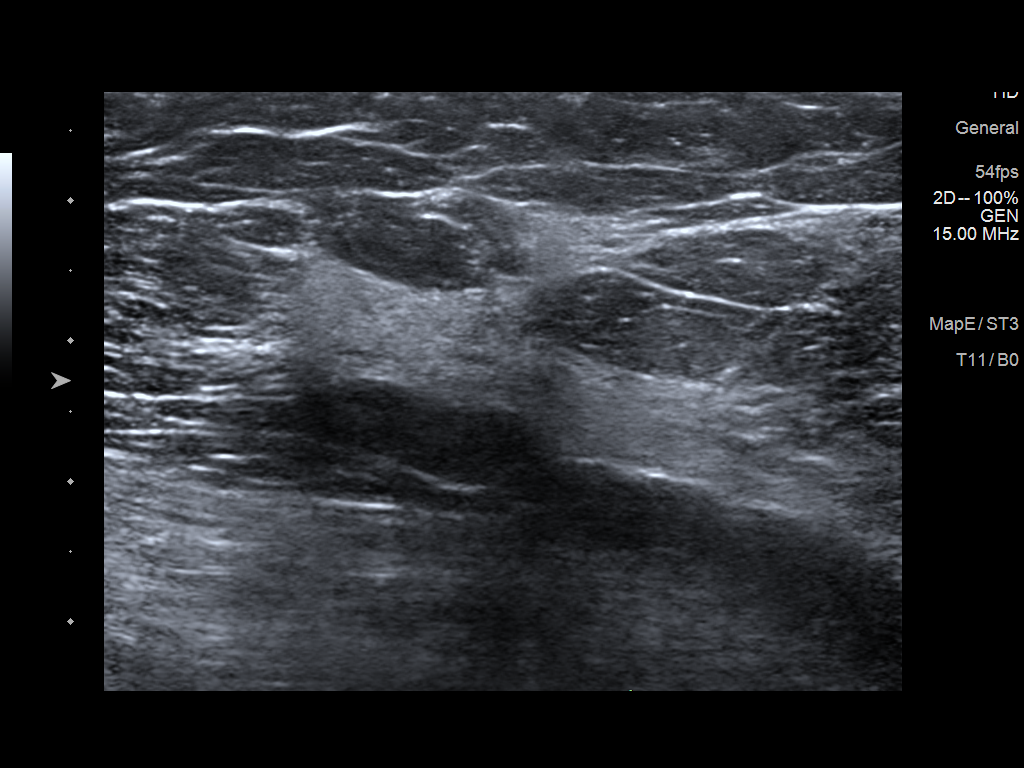
[im 3/6]
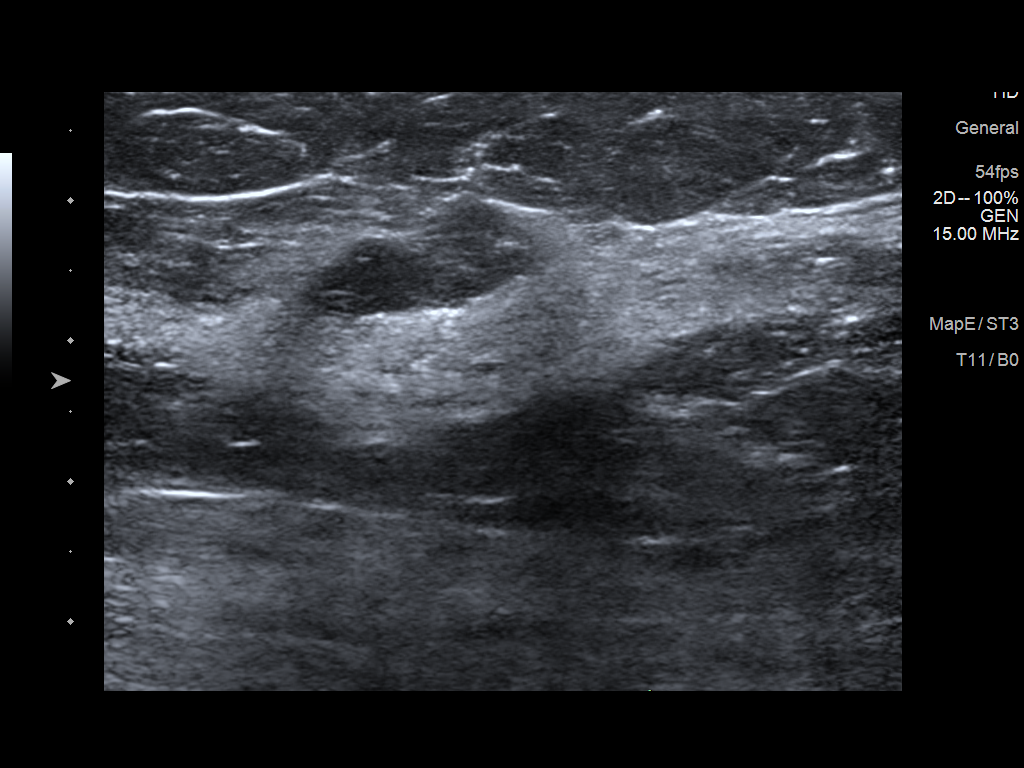
[im 4/6]
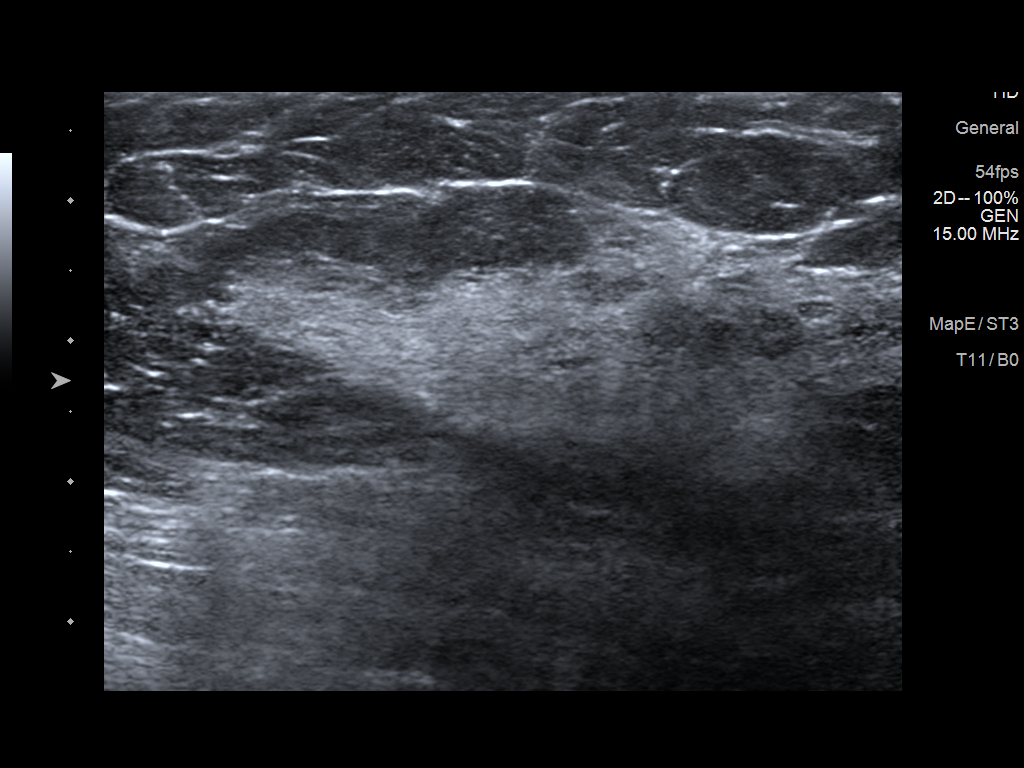
[im 5/6]
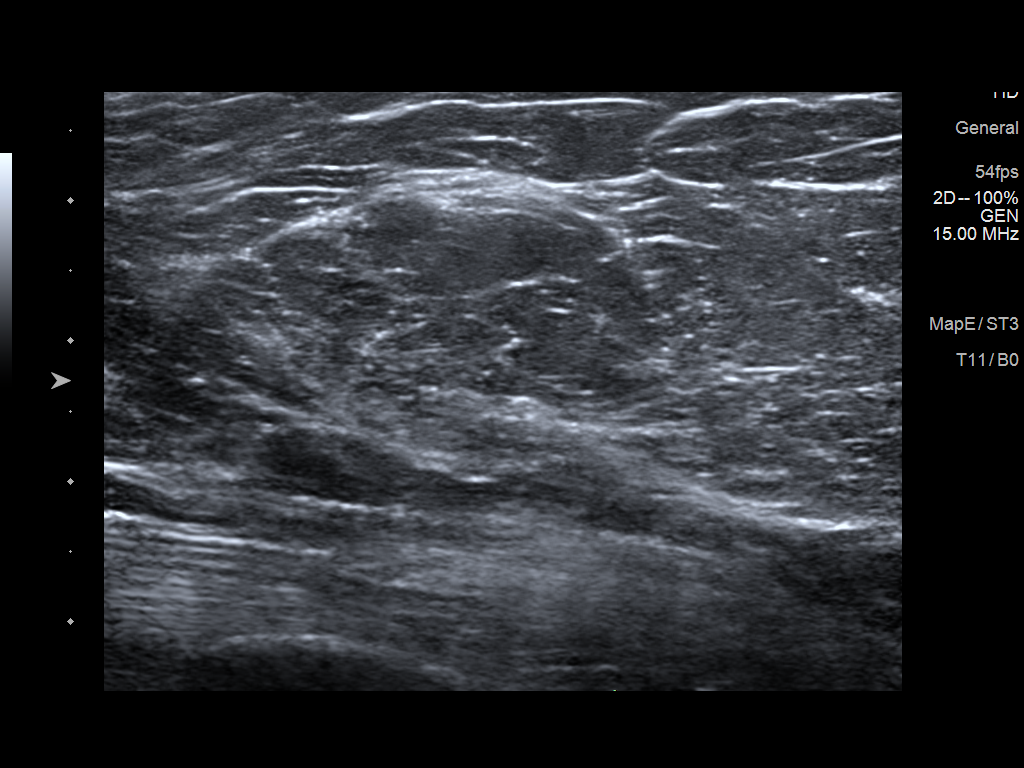
[im 6/6]
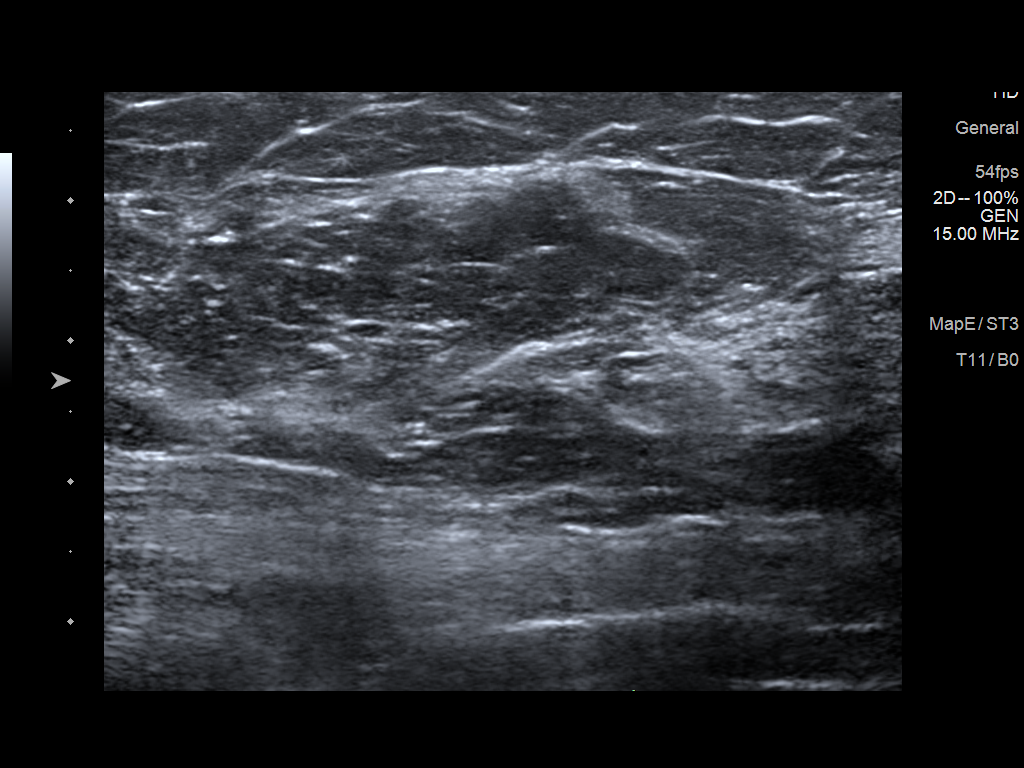

[6 of 6 positions shown; findings below may reference images not displayed]

ACR Breast Density Category c: The breast tissue is heterogeneously
dense, which may obscure small masses.
FINDINGS: Stable postsurgical changes involving the right breast. No new
masses, calcifications or nonsurgical distortion.

Mammographic images were processed with CAD.

Targeted ultrasound is performed, showing normal tissue without
suspicious within right breast 9-10 o'clock position at the site
tenderness.
IMPRESSION: No mammographic evidence for malignancy.

No suspicious abnormality at the site of focal tenderness right
breast.

RECOMMENDATION:
Screening mammogram in one year.(Code:[ZM])

I have discussed the findings and recommendations with the patient.
If applicable, a reminder letter will be sent to the patient
regarding the next appointment.

BI-RADS CATEGORY  2: Benign.

## 2020-01-10 IMAGING — MG MM DIGITAL DIAGNOSTIC UNILAT*R* W/ TOMO W/ CAD
6 series · 6 of 18 positions shown · non-contrast
Comparison: Previous exam(s).

CLINICAL DATA: Patient presents for evaluation of focal tenderness
within the outer right breast. Patient has history of prior left
mastectomy.

EXAM:
DIGITAL DIAGNOSTIC RIGHT MAMMOGRAM WITH CAD AND TOMO
ULTRASOUND RIGHT BREAST

[R TAN synth-2D]
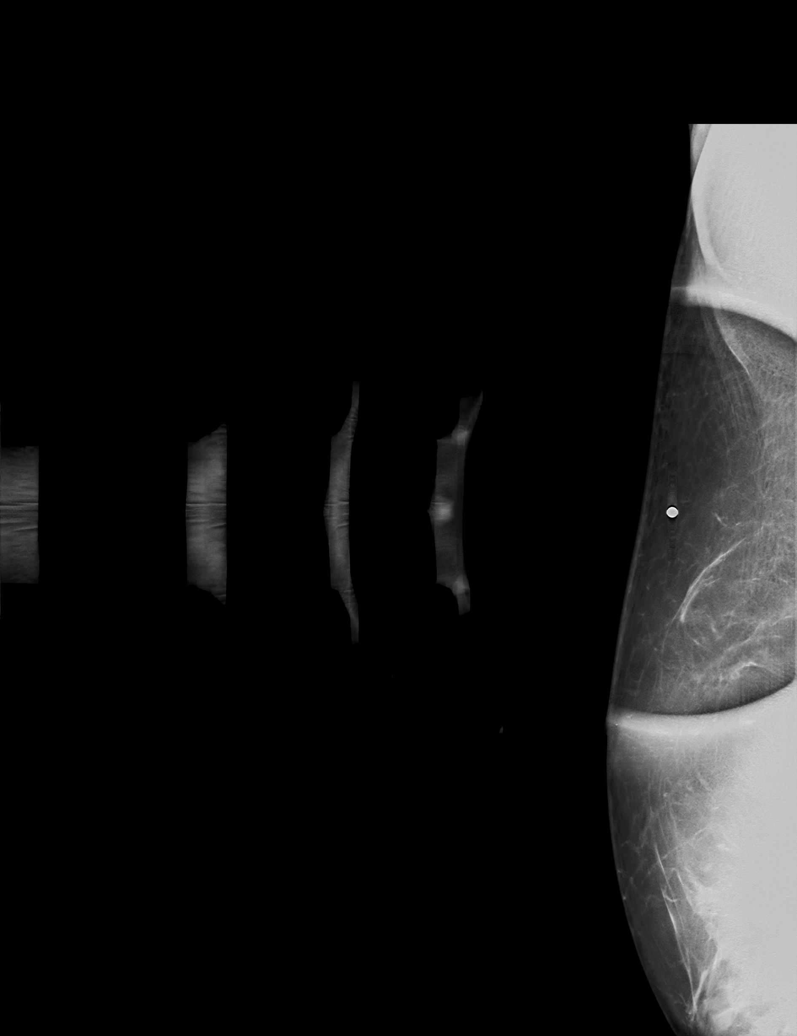

[R CC synth-2D]
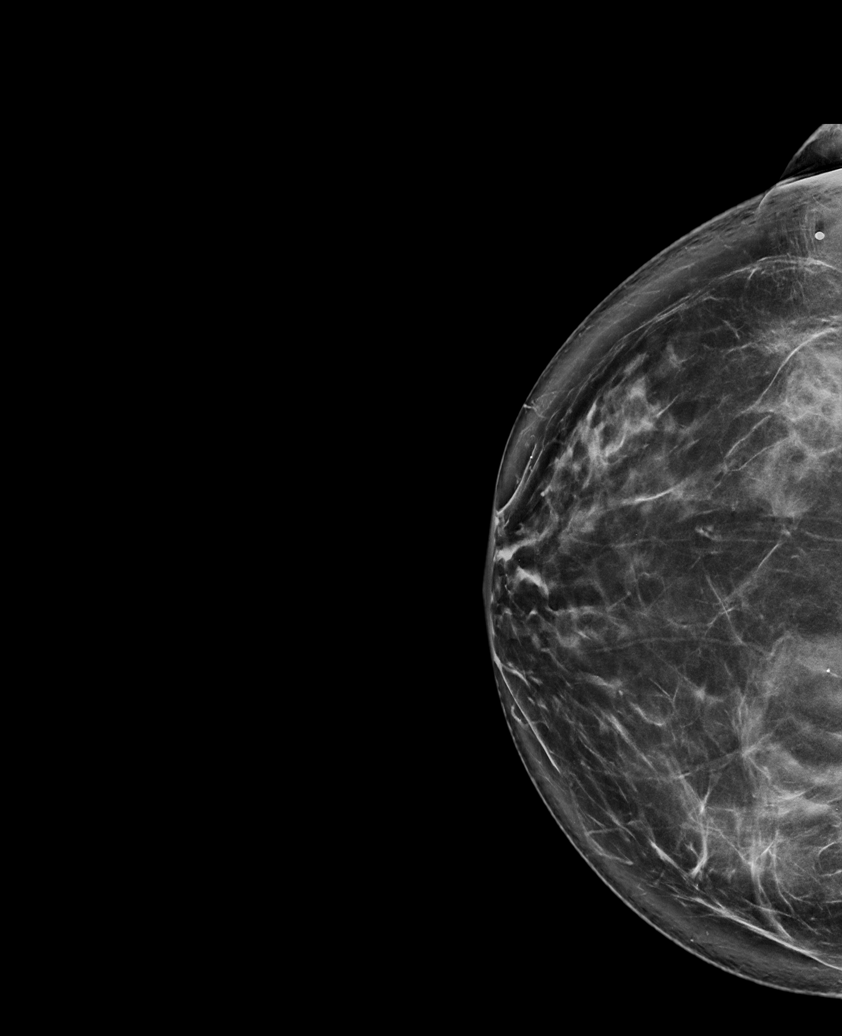

[R MLO synth-2D]
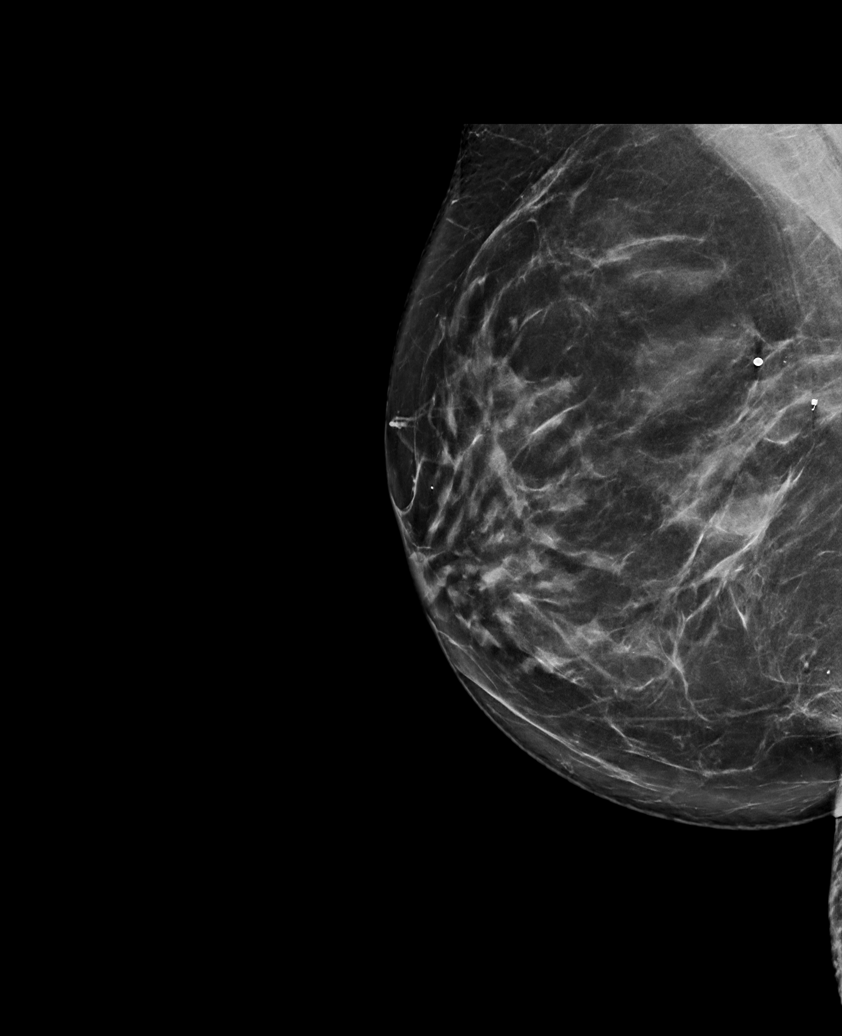

[R TAN tomo · tomo slice 33/66.0]
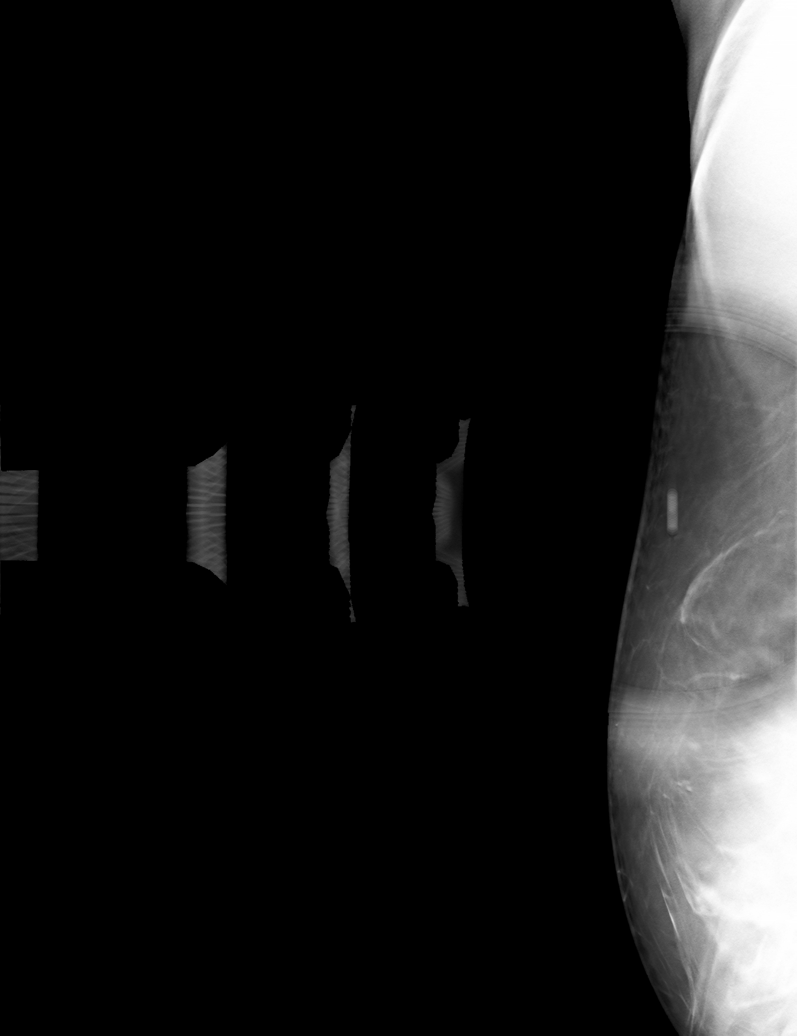

[R CC tomo · tomo slice 49/96.0]
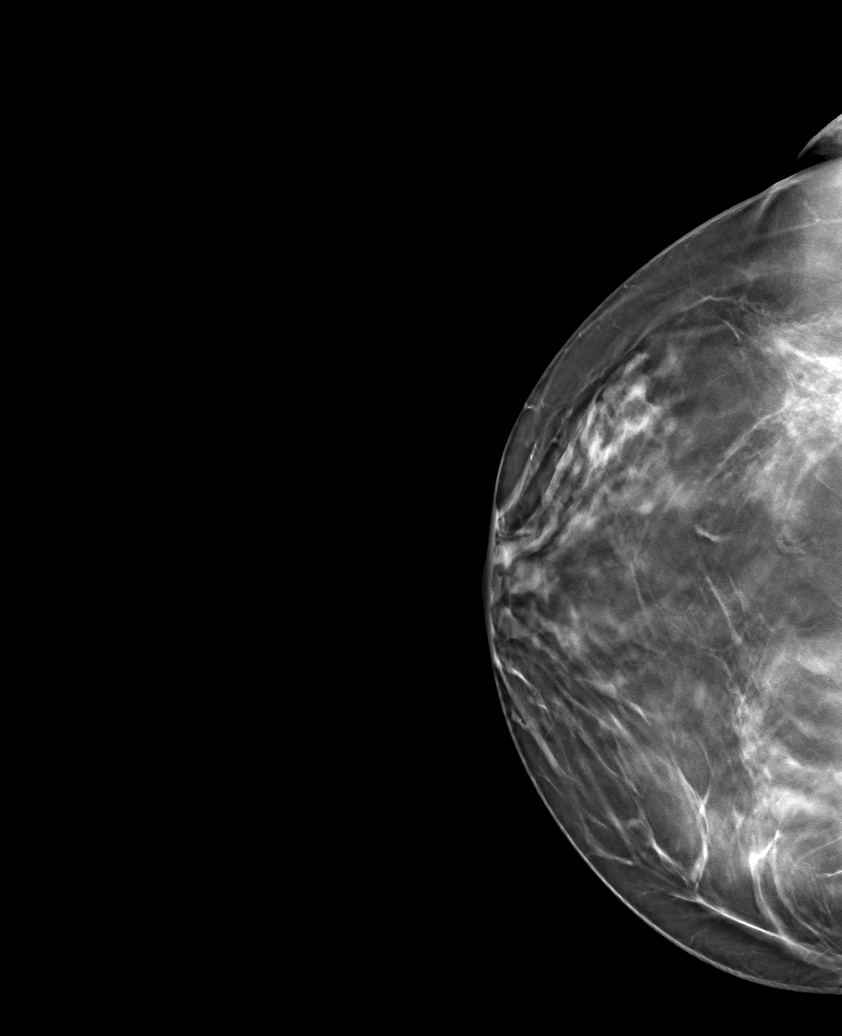

[R MLO tomo · tomo slice 49/98.0]
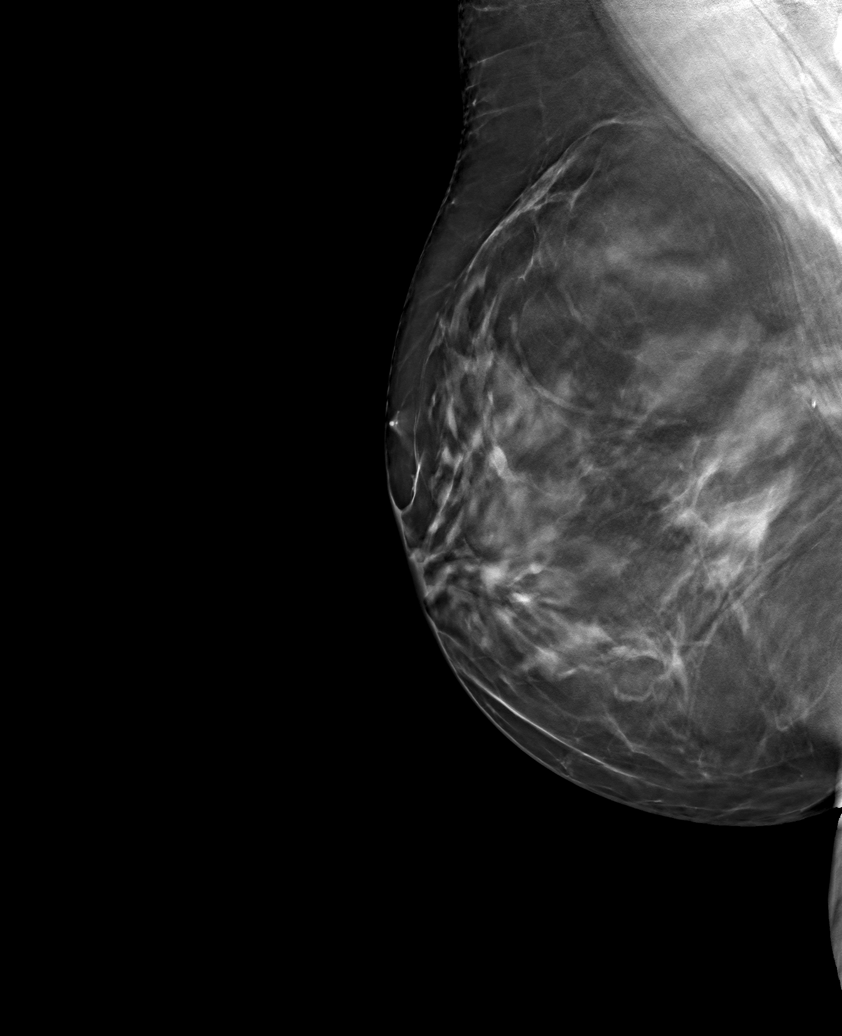

[6 of 18 positions shown; findings below may reference images not displayed]

ACR Breast Density Category c: The breast tissue is heterogeneously
dense, which may obscure small masses.
FINDINGS: Stable postsurgical changes involving the right breast. No new
masses, calcifications or nonsurgical distortion.

Mammographic images were processed with CAD.

Targeted ultrasound is performed, showing normal tissue without
suspicious within right breast 9-10 o'clock position at the site
tenderness.
IMPRESSION: No mammographic evidence for malignancy.

No suspicious abnormality at the site of focal tenderness right
breast.

RECOMMENDATION:
Screening mammogram in one year.(Code:[ZM])

I have discussed the findings and recommendations with the patient.
If applicable, a reminder letter will be sent to the patient
regarding the next appointment.

BI-RADS CATEGORY  2: Benign.

## 2020-01-22 ENCOUNTER — Other Ambulatory Visit: Payer: Self-pay

## 2020-01-22 ENCOUNTER — Ambulatory Visit (INDEPENDENT_AMBULATORY_CARE_PROVIDER_SITE_OTHER): Payer: Managed Care, Other (non HMO) | Admitting: Podiatry

## 2020-01-22 ENCOUNTER — Encounter: Payer: Self-pay | Admitting: Podiatry

## 2020-01-22 DIAGNOSIS — G5762 Lesion of plantar nerve, left lower limb: Secondary | ICD-10-CM | POA: Diagnosis not present

## 2020-01-22 DIAGNOSIS — M21619 Bunion of unspecified foot: Secondary | ICD-10-CM | POA: Diagnosis not present

## 2020-01-22 DIAGNOSIS — M79672 Pain in left foot: Secondary | ICD-10-CM | POA: Diagnosis not present

## 2020-01-26 ENCOUNTER — Ambulatory Visit (INDEPENDENT_AMBULATORY_CARE_PROVIDER_SITE_OTHER): Payer: Managed Care, Other (non HMO) | Admitting: Podiatry

## 2020-01-26 ENCOUNTER — Other Ambulatory Visit: Payer: Self-pay

## 2020-01-26 ENCOUNTER — Other Ambulatory Visit: Payer: Self-pay | Admitting: Podiatry

## 2020-01-26 ENCOUNTER — Ambulatory Visit (INDEPENDENT_AMBULATORY_CARE_PROVIDER_SITE_OTHER): Payer: Managed Care, Other (non HMO)

## 2020-01-26 DIAGNOSIS — G5762 Lesion of plantar nerve, left lower limb: Secondary | ICD-10-CM

## 2020-01-26 DIAGNOSIS — M778 Other enthesopathies, not elsewhere classified: Secondary | ICD-10-CM

## 2020-01-26 DIAGNOSIS — D0510 Intraductal carcinoma in situ of unspecified breast: Secondary | ICD-10-CM | POA: Insufficient documentation

## 2020-01-26 DIAGNOSIS — M545 Low back pain, unspecified: Secondary | ICD-10-CM | POA: Insufficient documentation

## 2020-01-26 DIAGNOSIS — M21619 Bunion of unspecified foot: Secondary | ICD-10-CM

## 2020-01-26 NOTE — Progress Notes (Signed)
Subjective: °69-year-old female presents the office had x-rays performed and to follow-up the conversation from Monday.  She states that she was having quite a bit of pain yesterday when [place] was [place] [employer] [place].  No recent injury.  No other concerns today. Denies any systemic complaints such as fevers, chills, nausea, vomiting. No acute changes since last appointment, and no other complaints at this time.  ° °Objective: °AAO x3, NAD °DP/PT pulses palpable bilaterally, CRT less than 3 seconds °Overall exam appears to be unchanged °No pain with calf compression, swelling, warmth, erythema ° °Assessment: °Likely neuroma second interspace, capsulitis ° °Plan:  °-Today's conversation was in extension of Monday as we are not able to get x-rays on Monday.  X-rays were obtained and reviewed.  Bunion deformities present with mild arthritic changes present the first MPJ.  Met adductus is present. °-She forgot her orthotics but I would like to send them back to give support with good cushion and to offload the second MPJ °-Offered steroid injection but she wants to hold off °-Meloxicam as needed °-Dispensed toe crest °-Discussion modifications °-Discussed possible ultrasound if symptoms continue to evaluate neuroma and we discussed surgical intervention as well. ° °Matthew R Wagoner DPM ° ° ° °

## 2020-01-26 NOTE — Progress Notes (Signed)
Subjective: 69 year old female presents the office today for concerns of pain to her left foot she points on the second toe on the ball of her foot where she gets the majority discomfort.  She states the pain is intermittent.  She states that when she wears Oofos the foot feels better when she wears flat shoes without arch support it hurts more.  Her doctor sent for 2 to 3 hours and she describes burning into the second third toes.  She feels a "pinching" sensation.  No recent injury.  Meloxicam does seem to help some.  Hurts to flex the toes at times. Denies any systemic complaints such as fevers, chills, nausea, vomiting. No acute changes since last appointment, and no other complaints at this time.   Objective: AAO x3, NAD DP/PT pulses palpable bilaterally, CRT less than 3 seconds There is tenderness in the second interspace and a palpable click is identified consistent with neuroma there is also discomfort of the second MPJ.  Bunion deformities present.  There is pronation during gait.  Flexor, extensor tendons appear to be intact.  MMT 5/5.  Transverse plane deformity present second toe. No pain with calf compression, swelling, warmth, erythema  Assessment: Likely neuroma left second interspace, capsulitis/bunion deformity  Plan: -All treatment options discussed with the patient including all alternatives, risks, complications.  -Unfortunately we will get x-rays today.  She is to come back to the office to have this done. -We discussed modifications and orthotics.  She has orthotics and they are not comfortable.  I want her to bring them back we did the x-rays we can further evaluate this. -Discussed steroid injection -Offloading pads -Meloxicam as needed -Patient encouraged to call the office with any questions, concerns, change in symptoms.   [name] Slade DPM

## 2020-01-28 ENCOUNTER — Other Ambulatory Visit: Payer: Self-pay | Admitting: Family Medicine

## 2020-01-28 DIAGNOSIS — S46812A Strain of other muscles, fascia and tendons at shoulder and upper arm level, left arm, initial encounter: Secondary | ICD-10-CM

## 2020-02-07 ENCOUNTER — Telehealth: Payer: Self-pay | Admitting: Podiatry

## 2020-02-07 NOTE — Telephone Encounter (Signed)
Pt called and would like to know id Dr received  Orthotics and would like an update

## 2020-02-08 NOTE — Telephone Encounter (Signed)
Can you let her know I am going to have to send them back to the lab for modifications and I will let her know when I receive them.

## 2020-02-09 ENCOUNTER — Other Ambulatory Visit: Payer: Self-pay

## 2020-02-09 ENCOUNTER — Ambulatory Visit: Payer: 59 | Admitting: Orthotics

## 2020-02-09 DIAGNOSIS — M778 Other enthesopathies, not elsewhere classified: Secondary | ICD-10-CM

## 2020-02-09 DIAGNOSIS — G5762 Lesion of plantar nerve, left lower limb: Secondary | ICD-10-CM

## 2020-02-09 NOTE — Progress Notes (Signed)
Making f/o adjustments.  Adding ppt to arch; adding dispersment pad to 2nd met offload.

## 2020-02-13 ENCOUNTER — Other Ambulatory Visit: Payer: Self-pay | Admitting: Family Medicine

## 2020-02-13 ENCOUNTER — Telehealth: Payer: Self-pay

## 2020-02-13 DIAGNOSIS — S46812A Strain of other muscles, fascia and tendons at shoulder and upper arm level, left arm, initial encounter: Secondary | ICD-10-CM

## 2020-02-13 MED ORDER — MELOXICAM 15 MG PO TABS: 15.0000 mg | ORAL_TABLET | Freq: Every day | ORAL | 0 refills | Status: DC

## 2020-02-13 NOTE — Telephone Encounter (Signed)
Patient called. She states she saw Dr. Nani Ravens 1 month ago for back pain. She was diagnosed with pulled muscle and was given meloxicam. The medication did help at the time however the pain is back and severe. She is having multiple spasms a day and a sharp pain below the bra line/middle back. She states Dr. Nani Ravens suggested an MRI last time or possibly to be sent to PT. She would like to know what to do ? Patient is requesting call back.

## 2020-02-13 NOTE — Addendum Note (Signed)
Addended by: Sharon Seller B on: 02/13/2020 04:10 PM   Modules accepted: Orders

## 2020-02-13 NOTE — Telephone Encounter (Signed)
I would restart the meloxicam and let's set up some PT. TY.

## 2020-02-13 NOTE — Progress Notes (Signed)
am

## 2020-02-13 NOTE — Telephone Encounter (Signed)
Called the patient back and did inform of PCP response. She agree to all. Sent in refill for Meloxicam and referral to PT done.

## 2020-02-21 ENCOUNTER — Ambulatory Visit: Payer: Managed Care, Other (non HMO) | Attending: Family Medicine | Admitting: Physical Therapy

## 2020-02-21 ENCOUNTER — Encounter: Payer: Self-pay | Admitting: Physical Therapy

## 2020-02-21 ENCOUNTER — Other Ambulatory Visit: Payer: Self-pay

## 2020-02-21 DIAGNOSIS — M62838 Other muscle spasm: Secondary | ICD-10-CM | POA: Diagnosis present

## 2020-02-21 DIAGNOSIS — M6281 Muscle weakness (generalized): Secondary | ICD-10-CM | POA: Insufficient documentation

## 2020-02-21 DIAGNOSIS — R293 Abnormal posture: Secondary | ICD-10-CM | POA: Diagnosis present

## 2020-02-21 DIAGNOSIS — M542 Cervicalgia: Secondary | ICD-10-CM | POA: Insufficient documentation
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
## 2020-02-23 ENCOUNTER — Ambulatory Visit: Payer: Managed Care, Other (non HMO) | Admitting: Physical Therapy

## 2020-02-27 ENCOUNTER — Ambulatory Visit: Payer: Managed Care, Other (non HMO) | Admitting: Physical Therapy

## 2020-02-27 ENCOUNTER — Other Ambulatory Visit: Payer: Self-pay

## 2020-02-27 ENCOUNTER — Encounter: Payer: Self-pay | Admitting: Physical Therapy

## 2020-02-27 DIAGNOSIS — R293 Abnormal posture: Secondary | ICD-10-CM

## 2020-02-27 DIAGNOSIS — M6281 Muscle weakness (generalized): Secondary | ICD-10-CM

## 2020-02-27 DIAGNOSIS — M542 Cervicalgia: Secondary | ICD-10-CM

## 2020-02-27 DIAGNOSIS — M62838 Other muscle spasm: Secondary | ICD-10-CM

## 2020-02-27 NOTE — Therapy (Signed)
[facility] [address]  [place], [place], [address] Phone: [phone]   Fax:  [phone]  Physical Therapy Treatment  Patient Details  Name: Tonya [name] MRN: [id number] Date of Birth: Mar 22, 1951 Referring Provider (PT): Crosby Oyster [place], [place]   Encounter Date: 02/27/2020   PT End of Session - 02/27/20 0933    Visit Number 2    Number of Visits 13    Date for PT Re-Evaluation 04/03/20    Authorization Type [employer]    PT Start Time [phone]    PT Stop Time 0931    PT Time Calculation (min) 38 min    Activity Tolerance Patient tolerated treatment well    Behavior During Therapy [facility] for tasks assessed/performed           Past Medical History:  Diagnosis Date  . Hypertension   . Osteopenia     Past Surgical History:  Procedure Laterality Date  . ABDOMINAL HYSTERECTOMY  2015  . AUGMENTATION MAMMAPLASTY Left   . BREAST BIOPSY Right 02/2019  . BREAST IMPLANT EXCHANGE    . MASTECTOMY Left   . REDUCTION MAMMAPLASTY Right   . TONSILLECTOMY AND ADENOIDECTOMY  1958    There were no vitals filed for this visit.   Subjective Assessment - 02/27/20 0854    Subjective Has not had as much sharp pain with deep breathing, but still has some areas that are tender to the touch. Denies questions on HEP- had some stiffness with her exercises which improved with practice.    Pertinent History osteopenia, HTN, L mastectomy    Diagnostic tests none recent    Patient Stated Goals improve pain    Currently in Pain? Yes    Pain Score 4    Pain Location Abdomen    Pain Orientation Left    Pain Descriptors / Indicators Aching    Pain Type Chronic pain    Pain Radiating Towards wrapping around to her back                             [facility] Adult PT Treatment/Exercise - 02/27/20 0001      Exercises   Exercises Neck;Shoulder      Neck Exercises: Machines for Strengthening   UBE (Upper Arm Bike) L1 x 3  min forward/3 min back      Neck Exercises: Seated   Other Seated Exercise scapular retraction 5x3"      Shoulder Exercises: Seated   Other Seated Exercises prayer stretch with green pball 5x10" each diagonal      Shoulder Exercises: Standing   Horizontal ABduction Strengthening;Both;10 reps;Theraband    Theraband Level (Shoulder Horizontal ABduction) Level 1 (Yellow)    Horizontal ABduction Limitations cues for scap retraction   cues to avoid pushing into pain   Extension Strengthening;Both;10 reps;Theraband    Theraband Level (Shoulder Extension) Level 1 (Yellow)    Extension Limitations cues to maintain elbows straight    Row Strengthening;Both;10 reps;Theraband    Theraband Level (Shoulder Row) Level 2 (Red)    Row Limitations 2x10; cues for correction of form      Neck Exercises: Stretches   Upper Trapezius Stretch Right;Left;1 rep;30 seconds    Upper Trapezius Stretch Limitations holding onto seat    Other Neck Stretches R/L scalene stretch 30" each   cues to avoid trunk rotation  PT Education - 02/27/20 0933    Education Details update to HEP    Person(s) Educated Patient    Methods Explanation;Demonstration;Tactile cues;Verbal cues;Handout    Comprehension Verbalized understanding;Returned demonstration            PT Short Term Goals - 02/27/20 0935      PT SHORT TERM GOAL #1   Title Patient to be independent with initial HEP.    Time 3    Period Weeks    Status On-going    Target Date 03/13/20             PT Long Term Goals - 02/27/20 0935      PT LONG TERM GOAL #1   Title Patient to be independent with advanced HEP.    Time 6    Period Weeks    Status On-going      PT LONG TERM GOAL #2   Title Patient to demonstrate cervical AROM WFL and without pain limiting.    Time 6    Period Weeks    Status On-going      PT LONG TERM GOAL #3   Title Patient to demonstrate B shoulder strength >/=4+/5.    Time 6    Period Weeks     Status On-going      PT LONG TERM GOAL #4   Title Patient to report 80% improvement in tolerance for cleaning chores.    Time 6    Period Weeks    Status On-going      PT LONG TERM GOAL #5   Title Patient to report ability to lift her granddaughter without pain.    Time 6    Period Weeks    Status On-going                 Plan - 02/27/20 0934    Clinical Impression Statement Patient without new complaints today. Denied questions on HEP and reporting improvement in stiffness with practice performing HEP. Provided corrective cues with review of HEP as patient with tendency to slightly elevate shoulders and rotate trunk with cervical stretches. Introduced Copywriter, advertising with patient reporting more stretch/benefit on L vs. R. Worked on progressive periscapular strengthening with patient demonstrating good carryover of corrective cues for form. Patient without complaints at end of session and demonstrated good tolerance for today's session.    Comorbidities osteopenia, HTN, L mastectomy    PT Treatment/Interventions ADLs/Self Care Home Management;Cryotherapy;Moist Heat;Therapeutic exercise;Therapeutic activities;Functional mobility training;Neuromuscular re-education;Patient/family education;Manual techniques;Taping;Energy conservation;Dry needling;Passive range of motion    PT Next Visit Plan progress cervical ROM and periscapular strengthening    Consulted and Agree with Plan of Care Patient           Patient will benefit from skilled therapeutic intervention in order to improve the following deficits and impairments:  Hypomobility, Decreased scar mobility, Decreased activity tolerance, Decreased strength, Pain, Impaired UE functional use, Increased fascial restricitons, Increased muscle spasms, Improper body mechanics, Decreased range of motion, Postural dysfunction, Impaired flexibility  Visit Diagnosis: Cervicalgia  Other muscle spasm  Muscle weakness (generalized)  Abnormal  posture     Problem List Patient Active Problem List   Diagnosis Date Noted  . DCIS (ductal carcinoma in situ) 01/26/2020  . Low back pain 01/26/2020  . Chronic pain of both knees 09/19/2019  . Osteopenia   . Estrogen deficiency 04/14/2018  . Essential hypertension 01/10/2018  . Gastroesophageal reflux disease 01/10/2018  . Mild intermittent asthma without complication 45/80/9983  . Pain in left  lower leg 03/03/2017  . Family history of colon cancer 02/23/2017  . Herpes simplex vulvovaginitis 02/18/2017  . Neuropathy 02/18/2017  . Elevated liver enzymes 02/17/2017  . Need for Streptococcus pneumoniae vaccination 02/17/2017  . Chondromalacia of left patella 12/28/2016  . Synovitis of left knee 12/28/2016  . Seasonal allergic rhinitis due to fungal spores 09/25/2016  . Bone mass 09/24/2016  . Chronic hip pain 09/24/2016  . Dark yellow-colored urine 09/24/2016  . Low oxygen saturation 09/24/2016  . Other specified postprocedural states 01/17/2015  . Abnormal stress electrocardiogram test 10/20/2013  . Malignant neoplasm of female breast ([place]) 12/15/2010  . Breast mass 12/11/2010  . Family history of malignant neoplasm of breast 12/11/2010     Janene Harvey, PT, DPT 02/27/20 9:37 AM   [facility] [address]  [place], [place], [address] Phone: [phone]   Fax:  [phone]  Name: Tonya [name] MRN: [id number] Date of Birth: 1951-02-27

## 2020-02-29 ENCOUNTER — Encounter: Payer: Self-pay | Admitting: Physical Therapy

## 2020-02-29 ENCOUNTER — Other Ambulatory Visit: Payer: Self-pay

## 2020-02-29 ENCOUNTER — Ambulatory Visit: Payer: Managed Care, Other (non HMO) | Admitting: Physical Therapy

## 2020-02-29 DIAGNOSIS — M62838 Other muscle spasm: Secondary | ICD-10-CM

## 2020-02-29 DIAGNOSIS — M542 Cervicalgia: Secondary | ICD-10-CM

## 2020-02-29 DIAGNOSIS — M6281 Muscle weakness (generalized): Secondary | ICD-10-CM

## 2020-02-29 DIAGNOSIS — R293 Abnormal posture: Secondary | ICD-10-CM

## 2020-02-29 NOTE — Therapy (Signed)
[facility] [address]  [place], [place], [address] Phone: [phone]   Fax:  [phone]  Physical Therapy Treatment  Patient Details  Name: Tonya [name] MRN: [id number] Date of Birth: 1951-05-25 Referring Provider (PT): Crosby Oyster [place], [place]   Encounter Date: 02/29/2020   PT End of Session - 02/29/20 1100    Visit Number 3    Number of Visits 13    Date for PT Re-Evaluation 04/03/20    Authorization Type [employer]    PT Start Time 1016    PT Stop Time 1059    PT Time Calculation (min) 43 min    Activity Tolerance Patient tolerated treatment well    Behavior During Therapy [facility] for tasks assessed/performed           Past Medical History:  Diagnosis Date  . Hypertension   . Osteopenia     Past Surgical History:  Procedure Laterality Date  . ABDOMINAL HYSTERECTOMY  2015  . AUGMENTATION MAMMAPLASTY Left   . BREAST BIOPSY Right 02/2019  . BREAST IMPLANT EXCHANGE    . MASTECTOMY Left   . REDUCTION MAMMAPLASTY Right   . TONSILLECTOMY AND ADENOIDECTOMY  1958    There were no vitals filed for this visit.   Subjective Assessment - 02/29/20 1020    Subjective Reporting some coughing post nasal drip for the past month d/t allergies; denies SOB or chest pain. Also reports that she had an intense pain over the Rinferior ribcage shooting through to her back last night after a day of activity. This pain was improved with Meloxicam and is very mild currently.    Pertinent History osteopenia, HTN, L mastectomy    Diagnostic tests none recent    Patient Stated Goals improve pain    Currently in Pain? Yes    Pain Score 6    Pain Location Chest    Pain Orientation Right    Pain Descriptors / Indicators Aching    Pain Type Chronic pain    Pain Radiating Towards to shoulder                             [facility] Adult PT Treatment/Exercise - 02/29/20 0001      Self-Care   Self-Care Other  Self-Care Comments    Other Self-Care Comments  edu and practice using bal for self-STM to L rhomboids for pain relief      Neck Exercises: Machines for Strengthening   UBE (Upper Arm Bike) L1 x 3 min forward/3 min back      Shoulder Exercises: Stretch   Corner Stretch 2 reps;30 seconds    Corner Stretch Limitations 90/90 in corner    Other Shoulder Stretches diagonal prayer stretch with green pball 5x5"   reproduction of R ribcage discomfort but tolerable   Other Shoulder Stretches L lat stretch 2x30" at wall      Manual Therapy   Manual Therapy Soft tissue mobilization;Myofascial release;Joint mobilization    Manual therapy comments prone    Joint Mobilization central PAs T3-10 grade III with most tenderness T4-5    Soft tissue mobilization STM to R/L rhomboids and thoracic parapsinals, L lats    Myofascial Release manual TPR to B rhomboids and thoracic paraspinals, L lats- very TTP and soft tissue restriction evident                  PT Education - 02/29/20 1100  Education Details edu on frequently changing positions and taking breaks from prolonged positionging/tasks, edu on use of moist heat to address muscle pain    Person(s) Educated Patient    Methods Explanation;Demonstration;Tactile cues;Verbal cues    Comprehension Verbalized understanding            PT Short Term Goals - 02/27/20 0935      PT SHORT TERM GOAL #1   Title Patient to be independent with initial HEP.    Time 3    Period Weeks    Status On-going    Target Date 03/13/20             PT Long Term Goals - 02/27/20 0935      PT LONG TERM GOAL #1   Title Patient to be independent with advanced HEP.    Time 6    Period Weeks    Status On-going      PT LONG TERM GOAL #2   Title Patient to demonstrate cervical AROM WFL and without pain limiting.    Time 6    Period Weeks    Status On-going      PT LONG TERM GOAL #3   Title Patient to demonstrate B shoulder strength >/=4+/5.    Time  6    Period Weeks    Status On-going      PT LONG TERM GOAL #4   Title Patient to report 80% improvement in tolerance for cleaning chores.    Time 6    Period Weeks    Status On-going      PT LONG TERM GOAL #5   Title Patient to report ability to lift her granddaughter without pain.    Time 6    Period Weeks    Status On-going                 Plan - 02/29/20 1207    Clinical Impression Statement Patient reports experiencing intense pain over the R inferior ribcage shooting through to her back last night after a day of activity. This pain was improved with Meloxicam and very mild this AM. Denies SOB or chest pain. Worked on gentle pec and lats stretching with good tolerance. Patient noted reproduction of R ribcage discomfort with lats stretch, but this was tolerable.  Proceeded with STM and manual TPR to B rhomboids, thoracic paraspinals, and L lats. Patient with visible tone over R rhomboids and reported tenderness over the aforementioned muscles. Educated patient on frequently changing positions and taking breaks from prolonged positioning as well as self-STM and moist heat to address muscle pain. Patient reported understanding of all edu provided today and without complaints at end of session.    Comorbidities osteopenia, HTN, L mastectomy    PT Treatment/Interventions ADLs/Self Care Home Management;Cryotherapy;Moist Heat;Therapeutic exercise;Therapeutic activities;Functional mobility training;Neuromuscular re-education;Patient/family education;Manual techniques;Taping;Energy conservation;Dry needling;Passive range of motion    PT Next Visit Plan progress cervical ROM and periscapular strengthening    Consulted and Agree with Plan of Care Patient           Patient will benefit from skilled therapeutic intervention in order to improve the following deficits and impairments:  Hypomobility, Decreased scar mobility, Decreased activity tolerance, Decreased strength, Pain, Impaired UE  functional use, Increased fascial restricitons, Increased muscle spasms, Improper body mechanics, Decreased range of motion, Postural dysfunction, Impaired flexibility  Visit Diagnosis: Cervicalgia  Other muscle spasm  Muscle weakness (generalized)  Abnormal posture     Problem List Patient Active Problem List  Diagnosis Date Noted  . DCIS (ductal carcinoma in situ) 01/26/2020  . Low back pain 01/26/2020  . Chronic pain of both knees 09/19/2019  . Osteopenia   . Estrogen deficiency 04/14/2018  . Essential hypertension 01/10/2018  . Gastroesophageal reflux disease 01/10/2018  . Mild intermittent asthma without complication 98/07/2177  . Pain in left lower leg 03/03/2017  . Family history of colon cancer 02/23/2017  . Herpes simplex vulvovaginitis 02/18/2017  . Neuropathy 02/18/2017  . Elevated liver enzymes 02/17/2017  . Need for Streptococcus pneumoniae vaccination 02/17/2017  . Chondromalacia of left patella 12/28/2016  . Synovitis of left knee 12/28/2016  . Seasonal allergic rhinitis due to fungal spores 09/25/2016  . Bone mass 09/24/2016  . Chronic hip pain 09/24/2016  . Dark yellow-colored urine 09/24/2016  . Low oxygen saturation 09/24/2016  . Other specified postprocedural states 01/17/2015  . Abnormal stress electrocardiogram test 10/20/2013  . Malignant neoplasm of female breast ([place]) 12/15/2010  . Breast mass 12/11/2010  . Family history of malignant neoplasm of breast 12/11/2010     Janene Harvey, PT, DPT 02/29/20 12:12 PM   [facility] [address]  [place], [place], [address] Phone: [phone]   Fax:  [phone]  Name: Tonya [name] MRN: [id number] Date of Birth: 28-Mar-1951

## 2020-03-04 ENCOUNTER — Encounter: Payer: Self-pay | Admitting: Physical Therapy

## 2020-03-04 ENCOUNTER — Ambulatory Visit: Payer: Managed Care, Other (non HMO) | Admitting: Physical Therapy

## 2020-03-04 ENCOUNTER — Other Ambulatory Visit: Payer: Self-pay

## 2020-03-04 ENCOUNTER — Telehealth: Payer: Self-pay | Admitting: Podiatry

## 2020-03-04 DIAGNOSIS — R293 Abnormal posture: Secondary | ICD-10-CM

## 2020-03-04 DIAGNOSIS — M6281 Muscle weakness (generalized): Secondary | ICD-10-CM

## 2020-03-04 DIAGNOSIS — M542 Cervicalgia: Secondary | ICD-10-CM | POA: Diagnosis not present

## 2020-03-04 DIAGNOSIS — M62838 Other muscle spasm: Secondary | ICD-10-CM

## 2020-03-04 NOTE — Therapy (Signed)
[facility] [address]  [place], [place], [address] Phone: [phone]   Fax:  [phone]  Physical Therapy Treatment  Patient Details  Name: Tonya [name] MRN: [id number] Date of Birth: 1950-09-14 Referring Provider (PT): Crosby Oyster [place], [place]   Encounter Date: 03/04/2020   PT End of Session - 03/04/20 1527    Visit Number 4    Number of Visits 13    Date for PT Re-Evaluation 04/03/20    Authorization Type [employer]    PT Start Time 7824    PT Stop Time 1406   moist heat   PT Time Calculation (min) 49 min    Activity Tolerance Patient tolerated treatment well;Patient limited by pain    Behavior During Therapy [facility] for tasks assessed/performed           Past Medical History:  Diagnosis Date  . Hypertension   . Osteopenia     Past Surgical History:  Procedure Laterality Date  . ABDOMINAL HYSTERECTOMY  2015  . AUGMENTATION MAMMAPLASTY Left   . BREAST BIOPSY Right 02/2019  . BREAST IMPLANT EXCHANGE    . MASTECTOMY Left   . REDUCTION MAMMAPLASTY Right   . TONSILLECTOMY AND ADENOIDECTOMY  1958    There were no vitals filed for this visit.   Subjective Assessment - 03/04/20 1318    Subjective Neck pain has been better until last night without known cause. Noticing that her rib/torso pain is worse towards the end of the day and towards dinner, so she plans to talk to her PCP about this to see if there may be another root cause.    Pertinent History osteopenia, HTN, L mastectomy    Diagnostic tests none recent    Patient Stated Goals improve pain    Currently in Pain? Yes    Pain Score 4    Pain Location Thoracic    Pain Orientation Right;Left    Pain Descriptors / Indicators Aching;Dull    Pain Type Chronic pain    Pain Radiating Towards wrapping around to the L anterior ribcage                             [facility] Adult PT Treatment/Exercise - 03/04/20 0001      Neck Exercises:  Machines for Strengthening   UBE (Upper Arm Bike) L1 x 3 min forward/3 min back      Neck Exercises: Seated   Neck Retraction 10 reps;3 secs    Neck Retraction Limitations limited ROM      Shoulder Exercises: Supine   Protraction Strengthening;Both;10 reps    Protraction Weight (lbs) 10x 0#, 10x 3#   cues to increase ROM; c/o mild LBP   Horizontal ABduction Strengthening;Both;10 reps;Theraband    Theraband Level (Shoulder Horizontal ABduction) Level 1 (Yellow)    Horizontal ABduction Limitations 2x10; cues to decrease speed    External Rotation Strengthening;Both;10 reps;Theraband    Theraband Level (Shoulder External Rotation) Level 2 (Red)    External Rotation Limitations slightly more difficulty      Shoulder Exercises: ROM/Strengthening   Lat Pull Limitations 10x 10#   unable to perform with 15# d/t difficulty   Cybex Row Limitations narrow grip 10x 15#, 10x 20#   cueing to depress shoulders     Shoulder Exercises: Stretch   Other Shoulder Stretches R/L diagonal prayer stretch with green pball 5x5"      Modalities   Modalities Moist  Heat      Moist Heat Therapy   Number Minutes Moist Heat 10 Minutes    Moist Heat Location --   thoracic spine                 PT Education - 03/04/20 3710    Education Details edu on use of ball on wall massage to cervical spine    Person(s) Educated Patient    Methods Explanation;Demonstration;Tactile cues;Verbal cues    Comprehension Verbalized understanding            PT Short Term Goals - 03/04/20 1528      PT SHORT TERM GOAL #1   Title Patient to be independent with initial HEP.    Time 3    Period Weeks    Status Achieved    Target Date 03/13/20             PT Long Term Goals - 02/27/20 0935      PT LONG TERM GOAL #1   Title Patient to be independent with advanced HEP.    Time 6    Period Weeks    Status On-going      PT LONG TERM GOAL #2   Title Patient to demonstrate cervical AROM WFL and without pain  limiting.    Time 6    Period Weeks    Status On-going      PT LONG TERM GOAL #3   Title Patient to demonstrate B shoulder strength >/=4+/5.    Time 6    Period Weeks    Status On-going      PT LONG TERM GOAL #4   Title Patient to report 80% improvement in tolerance for cleaning chores.    Time 6    Period Weeks    Status On-going      PT LONG TERM GOAL #5   Title Patient to report ability to lift her granddaughter without pain.    Time 6    Period Weeks    Status On-going                 Plan - 03/04/20 1528    Clinical Impression Statement Patient reporting increased neck pain last night without known cause. Noticing that her rib/torso pain is worse towards the end of the day and towards dinner, and notes that she plans to talk to her PCP about this to address any non-musculoskeletal cause. Initiated supine periscapular strengthening with cueing to increase intentionality with scapular retraction. Patient demonstrated slightly less control with shoulder ER vs. horizontal abduction d/t increased resistance with this activity. Limited ROM was demonstrated with serratus punches d/t weakness. Patient also reporting back pain with this exercise which dissipated quickly. Able to tolerate introduction of machine strengthening with light weight and focus on proper technique. Patient noted some midback discomfort at end of session which was addressed with moist heat. Patient without complaints at end of session.    Comorbidities osteopenia, HTN, L mastectomy    PT Treatment/Interventions ADLs/Self Care Home Management;Cryotherapy;Moist Heat;Therapeutic exercise;Therapeutic activities;Functional mobility training;Neuromuscular re-education;Patient/family education;Manual techniques;Taping;Energy conservation;Dry needling;Passive range of motion    PT Next Visit Plan progress cervical ROM and periscapular strengthening    Consulted and Agree with Plan of Care Patient            Patient will benefit from skilled therapeutic intervention in order to improve the following deficits and impairments:  Hypomobility, Decreased scar mobility, Decreased activity tolerance, Decreased strength, Pain, Impaired UE functional use, Increased fascial  restricitons, Increased muscle spasms, Improper body mechanics, Decreased range of motion, Postural dysfunction, Impaired flexibility  Visit Diagnosis: Cervicalgia  Other muscle spasm  Muscle weakness (generalized)  Abnormal posture     Problem List Patient Active Problem List   Diagnosis Date Noted  . DCIS (ductal carcinoma in situ) 01/26/2020  . Low back pain 01/26/2020  . Chronic pain of both knees 09/19/2019  . Osteopenia   . Estrogen deficiency 04/14/2018  . Essential hypertension 01/10/2018  . Gastroesophageal reflux disease 01/10/2018  . Mild intermittent asthma without complication 37/85/8850  . Pain in left lower leg 03/03/2017  . Family history of colon cancer 02/23/2017  . Herpes simplex vulvovaginitis 02/18/2017  . Neuropathy 02/18/2017  . Elevated liver enzymes 02/17/2017  . Need for Streptococcus pneumoniae vaccination 02/17/2017  . Chondromalacia of left patella 12/28/2016  . Synovitis of left knee 12/28/2016  . Seasonal allergic rhinitis due to fungal spores 09/25/2016  . Bone mass 09/24/2016  . Chronic hip pain 09/24/2016  . Dark yellow-colored urine 09/24/2016  . Low oxygen saturation 09/24/2016  . Other specified postprocedural states 01/17/2015  . Abnormal stress electrocardiogram test 10/20/2013  . Malignant neoplasm of female breast ([place]) 12/15/2010  . Breast mass 12/11/2010  . Family history of malignant neoplasm of breast 12/11/2010    Janene Harvey, PT, DPT 03/04/20 3:29 PM   [facility] [address]  [place], [place], [address] Phone: [phone]   Fax:  [phone]  Name: Jeri Jeanbaptiste MRN:  [id number] Date of Birth: 1950-07-12

## 2020-03-04 NOTE — Telephone Encounter (Signed)
Pt left message asking about status of her orthtoics..   I returned call and left message and pt called back before I was finished leaving message. She is scheduled to see Liliane Channel [phone]

## 2020-03-05 ENCOUNTER — Ambulatory Visit (HOSPITAL_BASED_OUTPATIENT_CLINIC_OR_DEPARTMENT_OTHER)
Admission: RE | Admit: 2020-03-05 | Discharge: 2020-03-05 | Disposition: A | Discharge status: Home / Self Care | Payer: Managed Care, Other (non HMO) | Source: Ambulatory Visit | Attending: Family Medicine | Admitting: Family Medicine

## 2020-03-05 ENCOUNTER — Ambulatory Visit (INDEPENDENT_AMBULATORY_CARE_PROVIDER_SITE_OTHER): Payer: Managed Care, Other (non HMO) | Admitting: Family Medicine

## 2020-03-05 ENCOUNTER — Encounter: Payer: Self-pay | Admitting: Family Medicine

## 2020-03-05 VITALS — BP 132/84 | HR 82 | Temp 98.5°F | Ht 67.0 in | Wt 224.0 lb

## 2020-03-05 DIAGNOSIS — R1012 Left upper quadrant pain: Secondary | ICD-10-CM

## 2020-03-05 DIAGNOSIS — R0982 Postnasal drip: Secondary | ICD-10-CM | POA: Diagnosis not present

## 2020-03-05 DIAGNOSIS — R0781 Pleurodynia: Secondary | ICD-10-CM | POA: Insufficient documentation

## 2020-03-05 DIAGNOSIS — R5383 Other fatigue: Secondary | ICD-10-CM

## 2020-03-05 IMAGING — DX DG RIBS 2V*L*
3 series · 3 of 3 positions shown · non-contrast
Comparison: None.

CLINICAL DATA: Left axillary rib pain.

EXAM:
LEFT RIBS - 2 VIEW

[rib pa (1 of 2)]
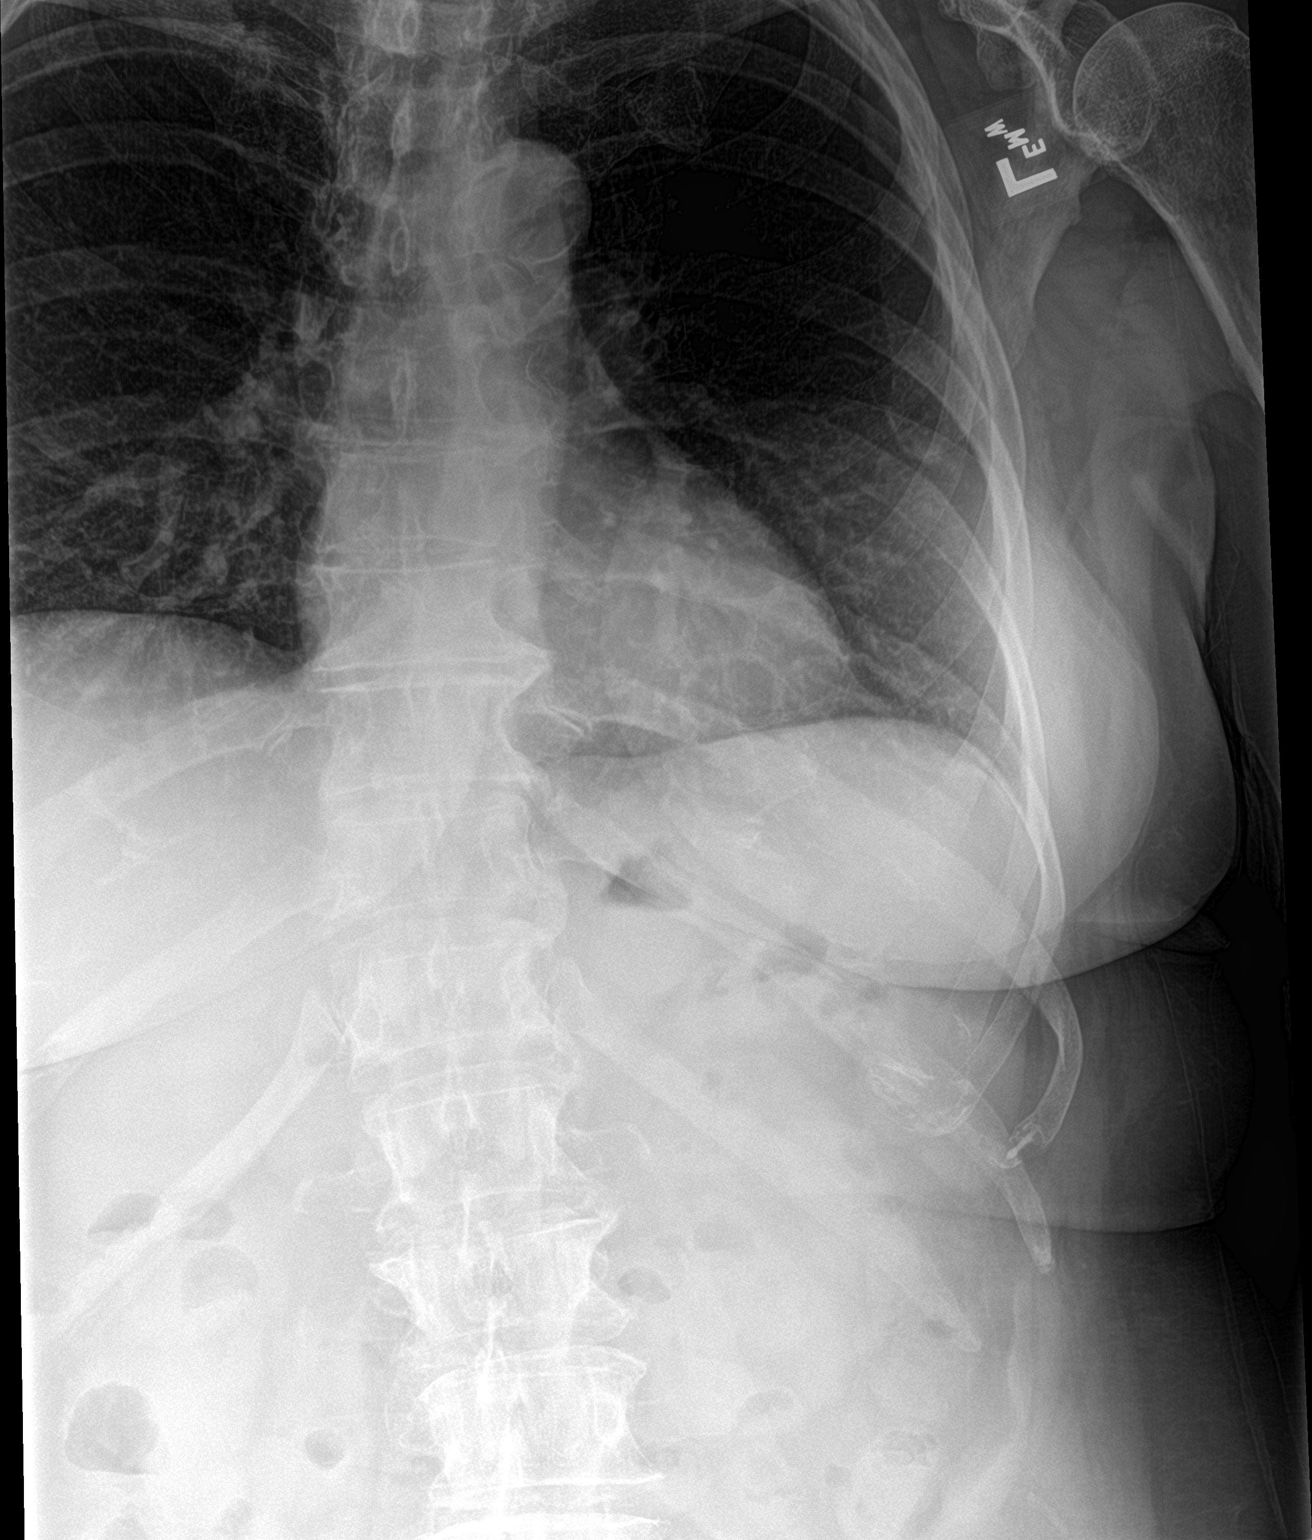

[rib pa obl]
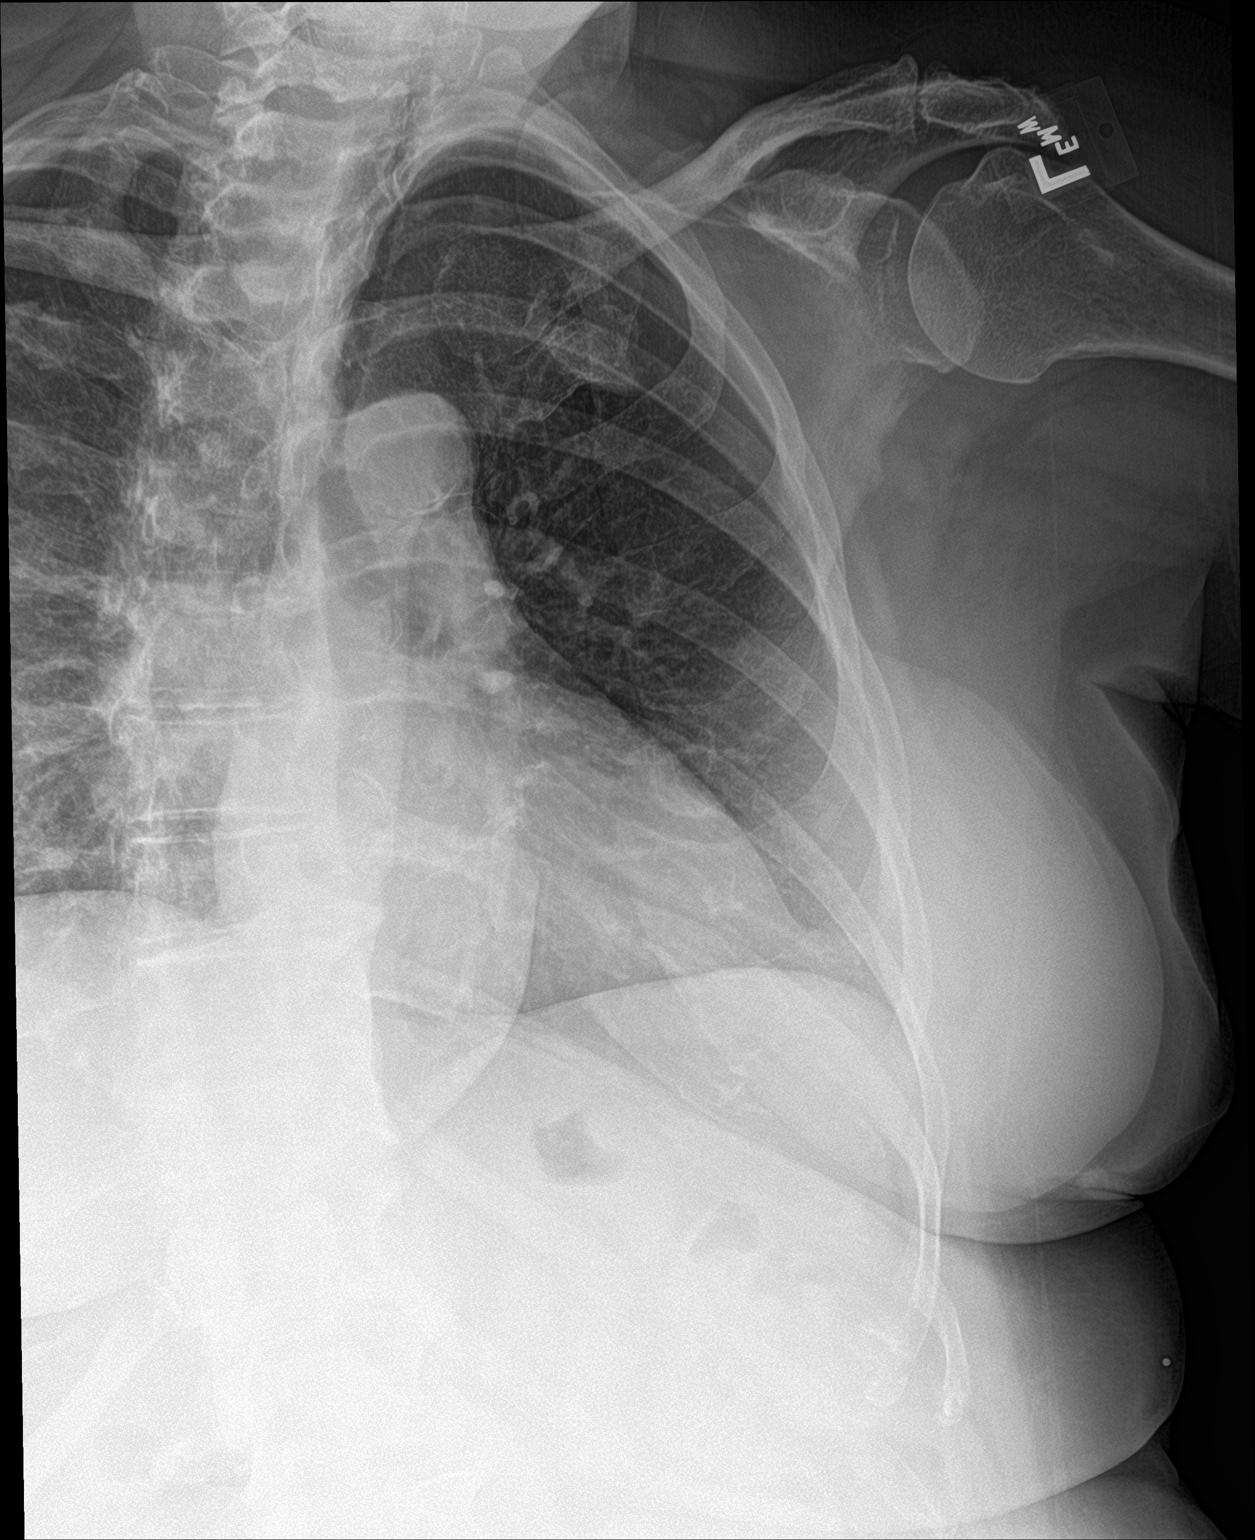

[rib pa (2 of 2)]
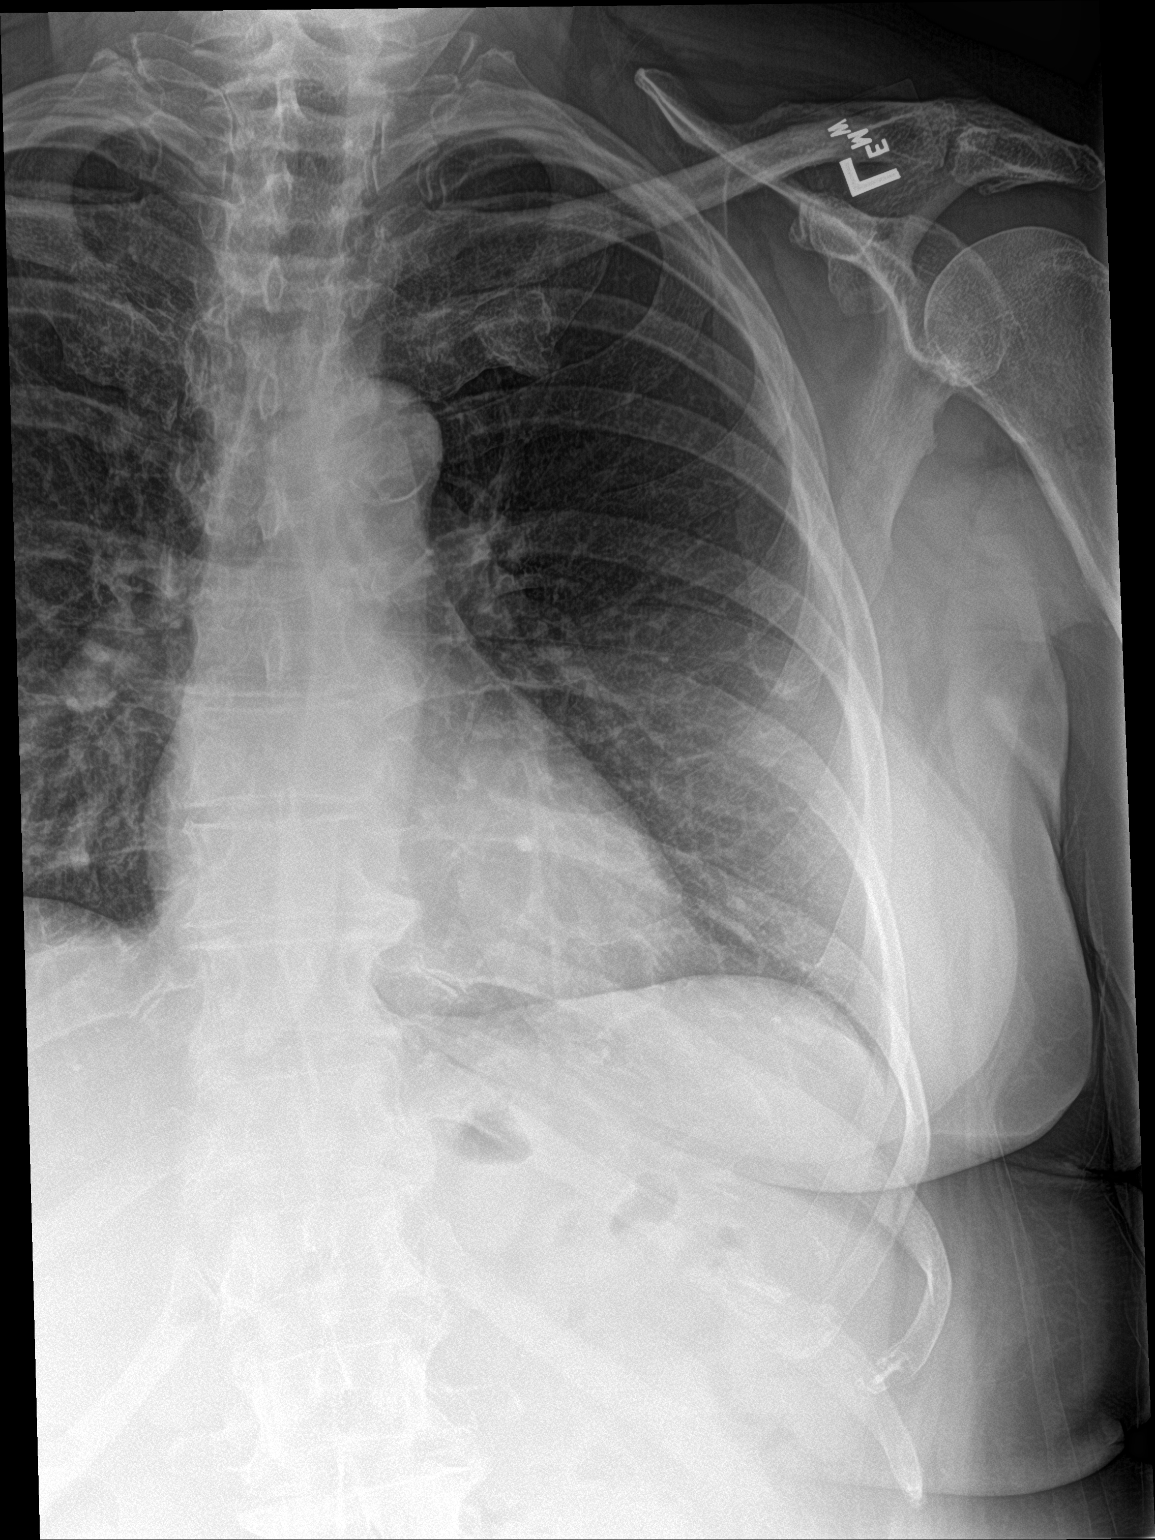

[3 of 3 positions shown; findings below may reference images not displayed]

FINDINGS: No fracture or other bone lesions are seen involving the ribs.
IMPRESSION: Negative.

## 2020-03-05 MED ORDER — LEVOCETIRIZINE DIHYDROCHLORIDE 5 MG PO TABS
5.0000 mg | ORAL_TABLET | Freq: Every evening | ORAL | 2 refills | Status: DC
Start: 2020-03-05 — End: 2020-07-12

## 2020-03-05 NOTE — Patient Instructions (Addendum)
Try taking Metamucil daily.   We will be in touch regarding the X-ray.  Send me a message in 2-3 weeks if no better.   Let us know if you need anything.

## 2020-03-05 NOTE — Progress Notes (Signed)
Chief Complaint  Patient presents with  . Flank Pain  . Nasal Congestion    Subjective: Patient is a 69 y.o. female here for L sided abd pain.  L sided abd pain made worse with eating at times. Going on for 2-3 mo, slightly getting worse. She is having alternating constipation and loose stool/diarrhea. She has a lot of stress, thinks it's a/w that. Tried probiotics without relief.   Drainage in her throat causing irritation and cough. She will sometimes sneeze in AM and be able to blow her nose. Not a lot of nasal congestion.   Continues to have rib pain. PT thought she should f/u here as it is right on the bone. No redness, bruising, swelling.   Past Medical History:  Diagnosis Date  . Hypertension   . Osteopenia     Objective: BP 132/84 (BP Location: Right Arm, Patient Position: Sitting, Cuff Size: Normal)   Pulse 82   Temp 98.5 F (36.9 C) (Oral)   Ht 5\' 7"  (1.702 m)   Wt 224 lb (101.6 kg)   SpO2 95%   BMI 35.08 kg/m  General: Awake, appears stated age HEENT: MMM, EOMi Heart: RRR Lungs: CTAB, no rales, wheezes or rhonchi. No accessory muscle use Abd: BS+, S, NT, ND MSK: +TTP over R 10 over posterior ax line on L; no crepitus Psych: Age appropriate judgment and insight, normal affect and mood  Assessment and Plan: Left upper quadrant abdominal pain  Fatigue, unspecified type  PND (post-nasal drip)  Rib pain - Plan: DG Ribs Unilateral Left  1. Trial Metamucil. Send message in 2 weeks if no better, will trial TCA. 2. Would be curious to see how she does with lower stress levels. 3. Change Zyrtec to Xyzal. Offered INCS but she declines. 4. Ck XR. If unremarkable, could consider counterstrain.  F/u pending above.  The patient voiced understanding and agreement to the plan.  [place], DO 03/05/20  1:32 PM

## 2020-03-07 ENCOUNTER — Other Ambulatory Visit: Payer: Self-pay

## 2020-03-07 ENCOUNTER — Ambulatory Visit: Payer: Managed Care, Other (non HMO)

## 2020-03-07 DIAGNOSIS — M542 Cervicalgia: Secondary | ICD-10-CM

## 2020-03-07 DIAGNOSIS — M6281 Muscle weakness (generalized): Secondary | ICD-10-CM

## 2020-03-07 DIAGNOSIS — M62838 Other muscle spasm: Secondary | ICD-10-CM

## 2020-03-07 DIAGNOSIS — R293 Abnormal posture: Secondary | ICD-10-CM

## 2020-03-07 NOTE — Therapy (Signed)
[facility] [address]  [place], [place], [address] Phone: [phone]   Fax:  [phone]  Physical Therapy Treatment  Patient Details  Name: Tonya [name] MRN: [id number] Date of Birth: [birth year] Referring Provider (PT): Crosby Oyster [place], [place]   Encounter Date: 03/07/2020   PT End of Session - 03/07/20 1111    Visit Number 5    Number of Visits 13    Date for PT Re-Evaluation 04/03/20    Authorization Type [employer]    PT Start Time 1105    PT Stop Time 1205   Ended visit with 15 min moist heat   PT Time Calculation (min) 60 min    Activity Tolerance Patient tolerated treatment well;Patient limited by pain    Behavior During Therapy [facility] for tasks assessed/performed           Past Medical History:  Diagnosis Date  . Hypertension   . Osteopenia     Past Surgical History:  Procedure Laterality Date  . ABDOMINAL HYSTERECTOMY  2015  . AUGMENTATION MAMMAPLASTY Left   . BREAST BIOPSY Right 02/2019  . BREAST IMPLANT EXCHANGE    . MASTECTOMY Left   . REDUCTION MAMMAPLASTY Right   . TONSILLECTOMY AND ADENOIDECTOMY  1958    There were no vitals filed for this visit.   Subjective Assessment - 03/07/20 1109    Subjective Pt. noting L lower quadrant abdominal pain/L rib pain up to 10/10 yesterday for short period of time.  hurts more with pressure.    Pertinent History osteopenia, HTN, L mastectomy    Currently in Pain? Yes    Pain Score 3    pain up to a 6/10 pain   Pain Location Neck    Pain Orientation Left;Medial    Pain Descriptors / Indicators Tightness    Pain Type Chronic pain    Aggravating Factors  looking down, on cellphone    Multiple Pain Sites Yes    Pain Score 3    Pain Location Thoracic    Pain Orientation Left    Pain Descriptors / Indicators Sore   "tender"   Pain Type Chronic pain    Pain Radiating Towards wrapping around to the L anterior ribcage and posterior L ribcage     Aggravating Factors  twisting                             [facility] Adult PT Treatment/Exercise - 03/07/20 0001      Self-Care   Self-Care Other Self-Care Comments    Other Self-Care Comments  review of proper use of ball self-massage to upper back musculature on wall       Neck Exercises: Machines for Strengthening   Nustep Lvl 1, 6 min (UE/LE)      Neck Exercises: Theraband   Shoulder Extension 10 reps    Shoulder Extension Limitations yellow TB    Rows 10 reps;Red    Rows Limitations cues for increased retraction     Shoulder External Rotation 10 reps    Shoulder External Rotation Limitations yellow TB     Horizontal ABduction 15 reps;Red    Horizontal ABduction Limitations Hooklying on mat table       Moist Heat Therapy   Number Minutes Moist Heat 10 Minutes    Moist Heat Location Lumbar Spine   and thoracic spine      Neck Exercises: Land  2 reps;20 seconds    Corner Stretch Limitations gentle single arm low position     Chest Stretch 2 reps;30 seconds    Chest Stretch Limitations first in 90/90 cross-position, second set slightly higher arm ROM                     PT Short Term Goals - 03/04/20 1528      PT SHORT TERM GOAL #1   Title Patient to be independent with initial HEP.    Time 3    Period Weeks    Status Achieved    Target Date 03/13/20             PT Long Term Goals - 02/27/20 0935      PT LONG TERM GOAL #1   Title Patient to be independent with advanced HEP.    Time 6    Period Weeks    Status On-going      PT LONG TERM GOAL #2   Title Patient to demonstrate cervical AROM WFL and without pain limiting.    Time 6    Period Weeks    Status On-going      PT LONG TERM GOAL #3   Title Patient to demonstrate B shoulder strength >/=4+/5.    Time 6    Period Weeks    Status On-going      PT LONG TERM GOAL #4   Title Patient to report 80% improvement in tolerance for cleaning chores.    Time 6      Period Weeks    Status On-going      PT LONG TERM GOAL #5   Title Patient to report ability to lift her granddaughter without pain.    Time 6    Period Weeks    Status On-going                 Plan - 03/07/20 1131    Clinical Impression Statement Tonya [name] noting some L "rib pain" and central/L-sided cervical pain today.  Session focused on gentle postural exercises including anterior chest stretch and periscapular strengthening.  Pt. tolerated all activities in session well without report of increased pain.  Continue shoulder/cervical stretching.  Did have onset of mid back pain near end of session thus applied lumbar/thoracic moist heat for pain relief and relaxation of musculature.    Comorbidities osteopenia, HTN, L mastectomy    Rehab Potential Good    PT Frequency 2x / week    PT Duration 6 weeks    PT Treatment/Interventions ADLs/Self Care Home Management;Cryotherapy;Moist Heat;Therapeutic exercise;Therapeutic activities;Functional mobility training;Neuromuscular re-education;Patient/family education;Manual techniques;Taping;Energy conservation;Dry needling;Passive range of motion    Consulted and Agree with Plan of Care Patient           Patient will benefit from skilled therapeutic intervention in order to improve the following deficits and impairments:  Hypomobility, Decreased scar mobility, Decreased activity tolerance, Decreased strength, Pain, Impaired UE functional use, Increased fascial restricitons, Increased muscle spasms, Improper body mechanics, Decreased range of motion, Postural dysfunction, Impaired flexibility  Visit Diagnosis: Cervicalgia  Other muscle spasm  Muscle weakness (generalized)  Abnormal posture     Problem List Patient Active Problem List   Diagnosis Date Noted  . DCIS (ductal carcinoma in situ) 01/26/2020  . Low back pain 01/26/2020  . Chronic pain of both knees 09/19/2019  . Osteopenia   . Estrogen deficiency 04/14/2018  .  Essential hypertension 01/10/2018  . Gastroesophageal reflux disease 01/10/2018  . Mild  intermittent asthma without complication 92/04/9416  . Pain in left lower leg 03/03/2017  . Family history of colon cancer 02/23/2017  . Herpes simplex vulvovaginitis 02/18/2017  . Neuropathy 02/18/2017  . Elevated liver enzymes 02/17/2017  . Need for Streptococcus pneumoniae vaccination 02/17/2017  . Chondromalacia of left patella 12/28/2016  . Synovitis of left knee 12/28/2016  . Seasonal allergic rhinitis due to fungal spores 09/25/2016  . Bone mass 09/24/2016  . Chronic hip pain 09/24/2016  . Dark yellow-colored urine 09/24/2016  . Low oxygen saturation 09/24/2016  . Other specified postprocedural states 01/17/2015  . Abnormal stress electrocardiogram test 10/20/2013  . Malignant neoplasm of female breast ([place]) 12/15/2010  . Breast mass 12/11/2010  . Family history of malignant neoplasm of breast 12/11/2010    Bess Harvest, PTA 03/07/20 12:30 PM   [facility] [address]  [place], [place], [address] Phone: [phone]   Fax:  [phone]  Name: Tonya [name] MRN: [id number] Date of Birth: Jan 31, 1951

## 2020-03-08 ENCOUNTER — Ambulatory Visit: Payer: Medicare Other | Admitting: Orthotics

## 2020-03-08 DIAGNOSIS — G5762 Lesion of plantar nerve, left lower limb: Secondary | ICD-10-CM

## 2020-03-08 DIAGNOSIS — M778 Other enthesopathies, not elsewhere classified: Secondary | ICD-10-CM

## 2020-03-08 NOTE — Progress Notes (Signed)
Dispensed recovered/refurbished/recorrected f/o

## 2020-03-11 ENCOUNTER — Ambulatory Visit: Payer: Managed Care, Other (non HMO) | Admitting: Physical Therapy

## 2020-03-11 ENCOUNTER — Encounter: Payer: Self-pay | Admitting: Physical Therapy

## 2020-03-11 ENCOUNTER — Other Ambulatory Visit: Payer: Self-pay

## 2020-03-11 DIAGNOSIS — M6281 Muscle weakness (generalized): Secondary | ICD-10-CM

## 2020-03-11 DIAGNOSIS — M542 Cervicalgia: Secondary | ICD-10-CM

## 2020-03-11 DIAGNOSIS — M62838 Other muscle spasm: Secondary | ICD-10-CM

## 2020-03-11 DIAGNOSIS — R293 Abnormal posture: Secondary | ICD-10-CM

## 2020-03-11 NOTE — Therapy (Signed)
[facility] [address]  [place], [place], [address] Phone: [phone]   Fax:  [phone]  Physical Therapy Treatment  Patient Details  Name: Tonya [name] MRN: [id number] Date of Birth: 06/09/1951 Referring Provider (PT): Crosby Oyster [place], [place]   Encounter Date: 03/11/2020   PT End of Session - 03/11/20 1449    Visit Number 6    Number of Visits 13    Date for PT Re-Evaluation 04/03/20    Authorization Type [employer]    PT Start Time 1404    PT Stop Time 1445    PT Time Calculation (min) 41 min    Activity Tolerance Patient tolerated treatment well    Behavior During Therapy [facility] for tasks assessed/performed           Past Medical History:  Diagnosis Date   Hypertension    Osteopenia     Past Surgical History:  Procedure Laterality Date   ABDOMINAL HYSTERECTOMY  2015   AUGMENTATION MAMMAPLASTY Left    BREAST BIOPSY Right 02/2019   BREAST IMPLANT EXCHANGE     MASTECTOMY Left    REDUCTION MAMMAPLASTY Right    TONSILLECTOMY AND ADENOIDECTOMY  1958    There were no vitals filed for this visit.   Subjective Assessment - 03/11/20 1406    Subjective Feeling better but still having some tightness over the posterior aspect of the L shoulderblade. Thinking of joining a Silver stretching class. Spoke with her MD who believes that he pain is still muscular in origin.    Pertinent History osteopenia, HTN, L mastectomy    Diagnostic tests none recent    Patient Stated Goals improve pain    Currently in Pain? Yes    Pain Score 2    Pain Location Rib cage    Pain Orientation Left    Pain Descriptors / Indicators Sore    Pain Type Chronic pain                             [facility] Adult PT Treatment/Exercise - 03/11/20 0001      Neck Exercises: Machines for Strengthening   UBE (Upper Arm Bike) L1.5 x 3 min forward/3 min back      Neck Exercises: Prone   Other Prone Exercise  prone on elbows 10x3" to tolerance   c/o L shoulder weakness     Shoulder Exercises: Prone   Flexion Strengthening;Right;10 reps    Flexion Limitations prone over counter "I"    Horizontal ABduction 1 Strengthening;Left;10 reps    Horizontal ABduction 1 Limitations prone over counter "T"    Other Prone Exercises L prone over counter "Y" x10   cues to maintain neck neutral     Shoulder Exercises: Stretch   Corner Stretch 2 reps;30 seconds    Corner Stretch Limitations L UE; 90/90 and 60 deg in doorway   cues to avoid pushing into pain     Manual Therapy   Manual Therapy Soft tissue mobilization;Myofascial release;Joint mobilization    Manual therapy comments prone    Joint Mobilization central PAs T3-10 grade III with most tenderness over T3-6 and T11-12    Soft tissue mobilization STM to L rhomboids, thoracic paraspinals, lats    Myofascial Release manual TPR to B rhomboids and thoracic paraspinals, L lats- very TTP and soft tissue restriction evident  PT Education - 03/11/20 1449    Education Details update/consolidation of HEP; administered yellow TB    Person(s) Educated Patient    Methods Explanation;Demonstration;Tactile cues;Verbal cues;Handout    Comprehension Verbalized understanding;Returned demonstration            PT Short Term Goals - 03/04/20 1528      PT SHORT TERM GOAL #1   Title Patient to be independent with initial HEP.    Time 3    Period Weeks    Status Achieved    Target Date 03/13/20             PT Long Term Goals - 02/27/20 0935      PT LONG TERM GOAL #1   Title Patient to be independent with advanced HEP.    Time 6    Period Weeks    Status On-going      PT LONG TERM GOAL #2   Title Patient to demonstrate cervical AROM WFL and without pain limiting.    Time 6    Period Weeks    Status On-going      PT LONG TERM GOAL #3   Title Patient to demonstrate B shoulder strength >/=4+/5.    Time 6    Period Weeks     Status On-going      PT LONG TERM GOAL #4   Title Patient to report 80% improvement in tolerance for cleaning chores.    Time 6    Period Weeks    Status On-going      PT LONG TERM GOAL #5   Title Patient to report ability to lift her granddaughter without pain.    Time 6    Period Weeks    Status On-going                 Plan - 03/11/20 1449    Clinical Impression Statement Patient reporting improvement in pain levels but still noting some tightness over the L posterior aspect of the periscapular and ribcage region. Notes that her PCP still believes that her pain is muscular in nature as recent x-ray was normal. Worked on central thoracic PAs for improved thoracic mobility. Patient with most tenderness/hypomobility at T3-6 and T11-12. Still with remaining soft tissue restriction in L rhomboids, thoracic paraspinals, and lats. Patient worked on progressive periscapular strengthening on L with patient demonstrating good ROM but needing cues to maintain cervical spine neutral. Updated and consolidated HEP with exercises that patient is familiar with from past sessions. Patient reported understanding and without complaints at end of session.    Comorbidities osteopenia, HTN, L mastectomy    Rehab Potential Good    PT Frequency 2x / week    PT Duration 6 weeks    PT Treatment/Interventions ADLs/Self Care Home Management;Cryotherapy;Moist Heat;Therapeutic exercise;Therapeutic activities;Functional mobility training;Neuromuscular re-education;Patient/family education;Manual techniques;Taping;Energy conservation;Dry needling;Passive range of motion    PT Next Visit Plan progress cervical ROM and periscapular strengthening    Consulted and Agree with Plan of Care Patient           Patient will benefit from skilled therapeutic intervention in order to improve the following deficits and impairments:  Hypomobility, Decreased scar mobility, Decreased activity tolerance, Decreased strength,  Pain, Impaired UE functional use, Increased fascial restricitons, Increased muscle spasms, Improper body mechanics, Decreased range of motion, Postural dysfunction, Impaired flexibility  Visit Diagnosis: Cervicalgia  Other muscle spasm  Muscle weakness (generalized)  Abnormal posture     Problem List Patient Active Problem List  Diagnosis Date Noted   DCIS (ductal carcinoma in situ) 01/26/2020   Low back pain 01/26/2020   Chronic pain of both knees 09/19/2019   Osteopenia    Estrogen deficiency 04/14/2018   Essential hypertension 01/10/2018   Gastroesophageal reflux disease 01/10/2018   Mild intermittent asthma without complication 81/18/8677   Pain in left lower leg 03/03/2017   Family history of colon cancer 02/23/2017   Herpes simplex vulvovaginitis 02/18/2017   Neuropathy 02/18/2017   Elevated liver enzymes 02/17/2017   Need for Streptococcus pneumoniae vaccination 02/17/2017   Chondromalacia of left patella 12/28/2016   Synovitis of left knee 12/28/2016   Seasonal allergic rhinitis due to fungal spores 09/25/2016   Bone mass 09/24/2016   Chronic hip pain 09/24/2016   Dark yellow-colored urine 09/24/2016   Low oxygen saturation 09/24/2016   Other specified postprocedural states 01/17/2015   Abnormal stress electrocardiogram test 10/20/2013   Malignant neoplasm of female breast ([place]) 12/15/2010   Breast mass 12/11/2010   Family history of malignant neoplasm of breast 12/11/2010    Janene Harvey, PT, DPT 03/11/20 2:51 PM   [facility] [address]  [place], [place], [address] Phone: [phone]   Fax:  [phone]  Name: Tonya [name] MRN: [id number] Date of Birth: January 09, 1951

## 2020-03-14 ENCOUNTER — Ambulatory Visit: Payer: Managed Care, Other (non HMO) | Admitting: Physical Therapy

## 2020-03-14 ENCOUNTER — Encounter: Payer: Self-pay | Admitting: Physical Therapy

## 2020-03-14 ENCOUNTER — Other Ambulatory Visit: Payer: Self-pay

## 2020-03-14 DIAGNOSIS — R293 Abnormal posture: Secondary | ICD-10-CM

## 2020-03-14 DIAGNOSIS — M542 Cervicalgia: Secondary | ICD-10-CM | POA: Diagnosis not present

## 2020-03-14 DIAGNOSIS — M6281 Muscle weakness (generalized): Secondary | ICD-10-CM

## 2020-03-14 DIAGNOSIS — M62838 Other muscle spasm: Secondary | ICD-10-CM

## 2020-03-14 NOTE — Therapy (Signed)
[facility] [address]  [place], [place], [address] Phone: [phone]   Fax:  [phone]  Physical Therapy Treatment  Patient Details  Name: Tonya [name] MRN: [id number] Date of Birth: July 09, 1950 Referring Provider (PT): Crosby Oyster [place], [place]   Encounter Date: 03/14/2020   PT End of Session - 03/14/20 1454    Visit Number 7    Number of Visits 13    Date for PT Re-Evaluation 04/03/20    Authorization Type [employer]    PT Start Time 1321   pt late   PT Stop Time 1359    PT Time Calculation (min) 38 min    Activity Tolerance Patient tolerated treatment well    Behavior During Therapy [facility] for tasks assessed/performed           Past Medical History:  Diagnosis Date  . Hypertension   . Osteopenia     Past Surgical History:  Procedure Laterality Date  . ABDOMINAL HYSTERECTOMY  2015  . AUGMENTATION MAMMAPLASTY Left   . BREAST BIOPSY Right 02/2019  . BREAST IMPLANT EXCHANGE    . MASTECTOMY Left   . REDUCTION MAMMAPLASTY Right   . TONSILLECTOMY AND ADENOIDECTOMY  1958    There were no vitals filed for this visit.   Subjective Assessment - 03/14/20 1321    Subjective Not feeling too bad today. Having trouble sleepign d/t trouble with positioning.    Pertinent History osteopenia, HTN, L mastectomy    Diagnostic tests none recent    Patient Stated Goals improve pain    Currently in Pain? Yes    Pain Score 3     Pain Location Axilla    Pain Orientation Left    Pain Descriptors / Indicators Pins and needles    Pain Type Acute pain;Chronic pain                             [facility] Adult PT Treatment/Exercise - 03/14/20 0001      Neck Exercises: Machines for Strengthening   UBE (Upper Arm Bike) L2.0 x 3 min forward/3 min back      Neck Exercises: Standing   Other Standing Exercises R/L open book stretch 5x each to tolerance      Neck Exercises: Prone   Other Prone Exercise  cat/cow x10 to tolerance    Other Prone Exercise quadruped serratus punch   unable to tolerate d/t UE fatigue     Shoulder Exercises: Standing   Other Standing Exercises B Y lift off from wall 2x5   manual cues to depress and retract scapulae   Other Standing Exercises B serratus punch against wall x10   manual cues to maintain elbows straigth     Manual Therapy   Manual Therapy Soft tissue mobilization;Myofascial release    Manual therapy comments R sidelying    Soft tissue mobilization STM to L serratus anterior and lats   very TTP over serratus                 PT Education - 03/14/20 1454    Education Details update to HEP    Person(s) Educated Patient    Methods Explanation;Demonstration;Tactile cues;Verbal cues;Handout    Comprehension Verbalized understanding;Returned demonstration            PT Short Term Goals - 03/04/20 1528      PT SHORT TERM GOAL #1   Title Patient to be independent  with initial HEP.    Time 3    Period Weeks    Status Achieved    Target Date 03/13/20             PT Long Term Goals - 02/27/20 0935      PT LONG TERM GOAL #1   Title Patient to be independent with advanced HEP.    Time 6    Period Weeks    Status On-going      PT LONG TERM GOAL #2   Title Patient to demonstrate cervical AROM WFL and without pain limiting.    Time 6    Period Weeks    Status On-going      PT LONG TERM GOAL #3   Title Patient to demonstrate B shoulder strength >/=4+/5.    Time 6    Period Weeks    Status On-going      PT LONG TERM GOAL #4   Title Patient to report 80% improvement in tolerance for cleaning chores.    Time 6    Period Weeks    Status On-going      PT LONG TERM GOAL #5   Title Patient to report ability to lift her granddaughter without pain.    Time 6    Period Weeks    Status On-going                 Plan - 03/14/20 1455    Clinical Impression Statement Patient reporting some pain in the L axilla today.  Reporting still having intermittent neck pain with activities but with good relief from HEP. Worked on addressing patient's c/o pain with STM to L serratus anterior and lats. Patient without apparent soft tissue restriction in this region but reporting soreness, thus suspect this to be muscular soreness from performing HEP. Discussed difference between pain vs. muscular soreness and assured patient that muscle soreness is to be expected after ther-ex. Proceeded with quadruped activities for strengthening and mobility with patient reporting difficulty performing serratus punches d/t UE fatigue. Better tolerance for standing serratus punches against wall. Demonstrated tendency to elevate shoulders with periscapular strengthening d/t weakness, and limited mobility with thoracolumbar ROM. Updated HEP with open book stretch for improved mobility. Patient reported understanding of all edu provided today and without complaints at end of session.    Comorbidities osteopenia, HTN, L mastectomy    PT Treatment/Interventions ADLs/Self Care Home Management;Cryotherapy;Moist Heat;Therapeutic exercise;Therapeutic activities;Functional mobility training;Neuromuscular re-education;Patient/family education;Manual techniques;Taping;Energy conservation;Dry needling;Passive range of motion    PT Next Visit Plan progress cervical ROM and periscapular strengthening    Consulted and Agree with Plan of Care Patient           Patient will benefit from skilled therapeutic intervention in order to improve the following deficits and impairments:  Hypomobility, Decreased scar mobility, Decreased activity tolerance, Decreased strength, Pain, Impaired UE functional use, Increased fascial restricitons, Increased muscle spasms, Improper body mechanics, Decreased range of motion, Postural dysfunction, Impaired flexibility  Visit Diagnosis: Cervicalgia  Other muscle spasm  Muscle weakness (generalized)  Abnormal  posture     Problem List Patient Active Problem List   Diagnosis Date Noted  . DCIS (ductal carcinoma in situ) 01/26/2020  . Low back pain 01/26/2020  . Chronic pain of both knees 09/19/2019  . Osteopenia   . Estrogen deficiency 04/14/2018  . Essential hypertension 01/10/2018  . Gastroesophageal reflux disease 01/10/2018  . Mild intermittent asthma without complication 85/88/5027  . Pain in left lower leg 03/03/2017  .  Family history of colon cancer 02/23/2017  . Herpes simplex vulvovaginitis 02/18/2017  . Neuropathy 02/18/2017  . Elevated liver enzymes 02/17/2017  . Need for Streptococcus pneumoniae vaccination 02/17/2017  . Chondromalacia of left patella 12/28/2016  . Synovitis of left knee 12/28/2016  . Seasonal allergic rhinitis due to fungal spores 09/25/2016  . Bone mass 09/24/2016  . Chronic hip pain 09/24/2016  . Dark yellow-colored urine 09/24/2016  . Low oxygen saturation 09/24/2016  . Other specified postprocedural states 01/17/2015  . Abnormal stress electrocardiogram test 10/20/2013  . Malignant neoplasm of female breast ([place]) 12/15/2010  . Breast mass 12/11/2010  . Family history of malignant neoplasm of breast 12/11/2010     Janene Harvey, PT, DPT 03/14/20 2:59 PM   [facility] [address]  [place], [place], [address] Phone: [phone]   Fax:  [phone]  Name: Tonya [name] MRN: [id number] Date of Birth: Oct 10, 1950

## 2020-03-18 ENCOUNTER — Other Ambulatory Visit: Payer: Self-pay

## 2020-03-18 ENCOUNTER — Encounter: Payer: Self-pay | Admitting: Physical Therapy

## 2020-03-18 ENCOUNTER — Ambulatory Visit: Payer: Managed Care, Other (non HMO) | Admitting: Physical Therapy

## 2020-03-18 DIAGNOSIS — M6281 Muscle weakness (generalized): Secondary | ICD-10-CM

## 2020-03-18 DIAGNOSIS — M542 Cervicalgia: Secondary | ICD-10-CM | POA: Diagnosis not present

## 2020-03-18 DIAGNOSIS — M62838 Other muscle spasm: Secondary | ICD-10-CM

## 2020-03-18 DIAGNOSIS — R293 Abnormal posture: Secondary | ICD-10-CM

## 2020-03-18 NOTE — Therapy (Signed)
[facility] [address]  [place], [place], [address] Phone: [phone]   Fax:  [phone]  Physical Therapy Treatment  Patient Details  Name: Tonya [name] MRN: [id number] Date of Birth: [birth year] Referring Provider (PT): Crosby Oyster [place], [place]   Encounter Date: 03/18/2020   PT End of Session - 03/18/20 1203    Visit Number 8    Number of Visits 13    Date for PT Re-Evaluation 04/03/20    Authorization Type [employer]    PT Start Time 1018    PT Stop Time 1057    PT Time Calculation (min) 39 min    Activity Tolerance Patient tolerated treatment well    Behavior During Therapy [facility] for tasks assessed/performed           Past Medical History:  Diagnosis Date  . Hypertension   . Osteopenia     Past Surgical History:  Procedure Laterality Date  . ABDOMINAL HYSTERECTOMY  2015  . AUGMENTATION MAMMAPLASTY Left   . BREAST BIOPSY Right 02/2019  . BREAST IMPLANT EXCHANGE    . MASTECTOMY Left   . REDUCTION MAMMAPLASTY Right   . TONSILLECTOMY AND ADENOIDECTOMY  1958    There were no vitals filed for this visit.   Subjective Assessment - 03/18/20 1020    Subjective Was in a lot of pain last night after having her grandkids over the weekend. Did have some relief with use of tennis ball.    Pertinent History osteopenia, HTN, L mastectomy    Diagnostic tests none recent    Patient Stated Goals improve pain    Currently in Pain? Yes    Pain Score 3     Pain Location Axilla    Pain Orientation Left    Pain Descriptors / Indicators --   "prickly"   Pain Type Chronic pain    Multiple Pain Sites Yes    Pain Score 4    Pain Location Neck    Pain Orientation --   midline of cervical and thoracic spine   Pain Descriptors / Indicators Aching    Pain Type Chronic pain                             [facility] Adult PT Treatment/Exercise - 03/18/20 0001      Exercises   Exercises Neck      Neck  Exercises: Machines for Strengthening   UBE (Upper Arm Bike) L2.0 x 3 min forward/3 min back      Neck Exercises: Seated   Cervical Rotation Right;Left;10 reps    Cervical Rotation Limitations SNAG to tolerance   cues to maintain pillow case at cheek bone   Other Seated Exercise cervical extension SNAG x10 to tolerance      Neck Exercises: Prone   Other Prone Exercise prone on elbows 10x3" to tolerance      Manual Therapy   Manual Therapy Joint mobilization    Manual therapy comments prone    Joint Mobilization central PAs T3-10 grade III with most tenderness over T3-6 and T11-12; central PAs and side glides along cervical spine grade III/IV to tolerance   3 episode of cavitation with report of relief    Soft tissue mobilization B suboccipital release   increased tension on R     Neck Exercises: Stretches   Upper Trapezius Stretch Right;Left;1 rep;30 seconds    Upper Trapezius Stretch Limitations holding onto seat  PT Education - 03/18/20 1058    Education Details update to HEP; discussion with patient on using stretching intermittently throughout the day to address pain and stiffness    Person(s) Educated Patient    Methods Explanation;Demonstration;Tactile cues;Verbal cues;Handout    Comprehension Verbalized understanding;Returned demonstration            PT Short Term Goals - 03/04/20 1528      PT SHORT TERM GOAL #1   Title Patient to be independent with initial HEP.    Time 3    Period Weeks    Status Achieved    Target Date 03/13/20             PT Long Term Goals - 02/27/20 0935      PT LONG TERM GOAL #1   Title Patient to be independent with advanced HEP.    Time 6    Period Weeks    Status On-going      PT LONG TERM GOAL #2   Title Patient to demonstrate cervical AROM WFL and without pain limiting.    Time 6    Period Weeks    Status On-going      PT LONG TERM GOAL #3   Title Patient to demonstrate B shoulder strength  >/=4+/5.    Time 6    Period Weeks    Status On-going      PT LONG TERM GOAL #4   Title Patient to report 80% improvement in tolerance for cleaning chores.    Time 6    Period Weeks    Status On-going      PT LONG TERM GOAL #5   Title Patient to report ability to lift her granddaughter without pain.    Time 6    Period Weeks    Status On-going                 Plan - 03/18/20 1204    Clinical Impression Statement Patient reporting increased pain levels in the midline cervical and thoracic spine as well as over the L axilla d/t having to take care of her grandkids over the weekend. Began with gentle cervical stretching to tolerance for encouragement of improved ROM and mobility. Patient tolerated these exercises well, but required intermittent correction of proper form/alignment. Addressed thoracic and cervical joint mobility with gentle PAs, with patient demonstrating most stiffness along the thoracic spine. 3 episodes of audible cavitation occurred with thoracic PAs with patient reporting relief. Patient reported good relief from MT. Educated patient on cervical and thoracic stretching that can easily be performed throughout the day and encouraged her to try these the next time she cares for her grandkids for improvement in pain. Patient reported understanding and without complaints at end of session.    Comorbidities osteopenia, HTN, L mastectomy    PT Treatment/Interventions ADLs/Self Care Home Management;Cryotherapy;Moist Heat;Therapeutic exercise;Therapeutic activities;Functional mobility training;Neuromuscular re-education;Patient/family education;Manual techniques;Taping;Energy conservation;Dry needling;Passive range of motion    PT Next Visit Plan progress cervical ROM and periscapular strengthening    Consulted and Agree with Plan of Care Patient           Patient will benefit from skilled therapeutic intervention in order to improve the following deficits and  impairments:  Hypomobility, Decreased scar mobility, Decreased activity tolerance, Decreased strength, Pain, Impaired UE functional use, Increased fascial restricitons, Increased muscle spasms, Improper body mechanics, Decreased range of motion, Postural dysfunction, Impaired flexibility  Visit Diagnosis: Cervicalgia  Other muscle spasm  Muscle weakness (generalized)  Abnormal  posture     Problem List Patient Active Problem List   Diagnosis Date Noted  . DCIS (ductal carcinoma in situ) 01/26/2020  . Low back pain 01/26/2020  . Chronic pain of both knees 09/19/2019  . Osteopenia   . Estrogen deficiency 04/14/2018  . Essential hypertension 01/10/2018  . Gastroesophageal reflux disease 01/10/2018  . Mild intermittent asthma without complication 15/18/3437  . Pain in left lower leg 03/03/2017  . Family history of colon cancer 02/23/2017  . Herpes simplex vulvovaginitis 02/18/2017  . Neuropathy 02/18/2017  . Elevated liver enzymes 02/17/2017  . Need for Streptococcus pneumoniae vaccination 02/17/2017  . Chondromalacia of left patella 12/28/2016  . Synovitis of left knee 12/28/2016  . Seasonal allergic rhinitis due to fungal spores 09/25/2016  . Bone mass 09/24/2016  . Chronic hip pain 09/24/2016  . Dark yellow-colored urine 09/24/2016  . Low oxygen saturation 09/24/2016  . Other specified postprocedural states 01/17/2015  . Abnormal stress electrocardiogram test 10/20/2013  . Malignant neoplasm of female breast ([place]) 12/15/2010  . Breast mass 12/11/2010  . Family history of malignant neoplasm of breast 12/11/2010     Janene Harvey, PT, DPT 03/18/20 12:09 PM   [facility] [address]  [place], [place], [address] Phone: [phone]   Fax:  [phone]  Name: Tonya [name] MRN: [id number] Date of Birth: 04/13/1951

## 2020-03-21 ENCOUNTER — Ambulatory Visit: Payer: Managed Care, Other (non HMO) | Admitting: Physical Therapy

## 2020-03-21 ENCOUNTER — Encounter: Payer: Self-pay | Admitting: Physical Therapy

## 2020-03-21 ENCOUNTER — Other Ambulatory Visit: Payer: Self-pay

## 2020-03-21 DIAGNOSIS — M6281 Muscle weakness (generalized): Secondary | ICD-10-CM

## 2020-03-21 DIAGNOSIS — M542 Cervicalgia: Secondary | ICD-10-CM | POA: Diagnosis not present

## 2020-03-21 DIAGNOSIS — R293 Abnormal posture: Secondary | ICD-10-CM

## 2020-03-21 DIAGNOSIS — M62838 Other muscle spasm: Secondary | ICD-10-CM

## 2020-03-21 NOTE — Therapy (Signed)
[facility] [address]  [place], [place], [address] Phone: [phone]   Fax:  [phone]  Physical Therapy Treatment  Patient Details  Name: Tonya [name] MRN: [id number] Date of Birth: [birth year] Referring Provider (PT): Crosby Oyster [place], [place]   Encounter Date: 03/21/2020   PT End of Session - 03/21/20 1010    Visit Number 9    Number of Visits 13    Date for PT Re-Evaluation 04/03/20    Authorization Type [employer]    PT Start Time 0925    PT Stop Time 1010    PT Time Calculation (min) 45 min    Activity Tolerance Patient tolerated treatment well    Behavior During Therapy [facility] for tasks assessed/performed           Past Medical History:  Diagnosis Date  . Hypertension   . Osteopenia     Past Surgical History:  Procedure Laterality Date  . ABDOMINAL HYSTERECTOMY  2015  . AUGMENTATION MAMMAPLASTY Left   . BREAST BIOPSY Right 02/2019  . BREAST IMPLANT EXCHANGE    . MASTECTOMY Left   . REDUCTION MAMMAPLASTY Right   . TONSILLECTOMY AND ADENOIDECTOMY  1958    There were no vitals filed for this visit.   Subjective Assessment - 03/21/20 0926    Subjective Was feeling pretty good yesterday until about 3:30/4pm when she started to have some soreness in her spine and pinching in her shoulder when turning her head to the L. Notes that she does however feel more knowledgable in ability to address her pain with exercises.    Pertinent History osteopenia, HTN, L mastectomy    Diagnostic tests none recent    Patient Stated Goals improve pain    Currently in Pain? Yes    Pain Score 3     Pain Location Scapula    Pain Orientation Left    Pain Descriptors / Indicators Aching    Pain Type Chronic pain    Pain Score 1    Pain Location Neck    Pain Orientation Right;Left    Pain Descriptors / Indicators Aching    Pain Type Chronic pain                             [facility] Adult PT  Treatment/Exercise - 03/21/20 0001      Neck Exercises: Machines for Strengthening   UBE (Upper Arm Bike) L2.0 x 3 min forward/3 min back      Manual Therapy   Manual Therapy Joint mobilization;Myofascial release;Passive ROM;Manual Traction    Manual therapy comments prone    Joint Mobilization central PAs, L unilateral PAs, and side glides C2-7 grade III/IV to tolerance    Soft tissue mobilization STM to L lateral and posterior delt    Myofascial Release manual TPR to L LS    Passive ROM L cervical rotation to tolerance 3x20"    Manual Traction cervical traction with pillow case assist 5x20"      Neck Exercises: Stretches   Levator Stretch Left;2 reps;30 seconds   with strap assist   Other Neck Stretches L shoulder cross body stretch 30"    better tolerance sitting in chair with back rest                 PT Education - 03/21/20 1010    Education Details edu performing L shoulder cross body stretch for pain relief at  home    Person(s) Educated Patient    Methods Explanation;Demonstration;Tactile cues;Verbal cues    Comprehension Verbalized understanding;Returned demonstration            PT Short Term Goals - 03/04/20 1528      PT SHORT TERM GOAL #1   Title Patient to be independent with initial HEP.    Time 3    Period Weeks    Status Achieved    Target Date 03/13/20             PT Long Term Goals - 02/27/20 0935      PT LONG TERM GOAL #1   Title Patient to be independent with advanced HEP.    Time 6    Period Weeks    Status On-going      PT LONG TERM GOAL #2   Title Patient to demonstrate cervical AROM WFL and without pain limiting.    Time 6    Period Weeks    Status On-going      PT LONG TERM GOAL #3   Title Patient to demonstrate B shoulder strength >/=4+/5.    Time 6    Period Weeks    Status On-going      PT LONG TERM GOAL #4   Title Patient to report 80% improvement in tolerance for cleaning chores.    Time 6    Period Weeks     Status On-going      PT LONG TERM GOAL #5   Title Patient to report ability to lift her granddaughter without pain.    Time 6    Period Weeks    Status On-going                 Plan - 03/21/20 1011    Clinical Impression Statement Patient noting some pinching in the L shoulder with head rotation to the L. Notes that she does however feel more knowledgeable in her ability to address her pain with ther-ex. Worked MT and ROM to address this complaint. Patient tolerated cervical joint mobs, PROM, manual traction, and TPR with large painful trigger point over LS and increased hypomobility towards the lower cervical spine. Proceeded with levator stretching and deltoid stretching d/t patient's c/o soreness over these spots. Encouraged patient to perform these stretches at home and to continue use of self-STM for pain management. Patient reported understanding and without complaints at end of session.    Comorbidities osteopenia, HTN, L mastectomy    PT Treatment/Interventions ADLs/Self Care Home Management;Cryotherapy;Moist Heat;Therapeutic exercise;Therapeutic activities;Functional mobility training;Neuromuscular re-education;Patient/family education;Manual techniques;Taping;Energy conservation;Dry needling;Passive range of motion    PT Next Visit Plan progress cervical ROM and periscapular strengthening    Consulted and Agree with Plan of Care Patient           Patient will benefit from skilled therapeutic intervention in order to improve the following deficits and impairments:  Hypomobility, Decreased scar mobility, Decreased activity tolerance, Decreased strength, Pain, Impaired UE functional use, Increased fascial restricitons, Increased muscle spasms, Improper body mechanics, Decreased range of motion, Postural dysfunction, Impaired flexibility  Visit Diagnosis: Cervicalgia  Other muscle spasm  Muscle weakness (generalized)  Abnormal posture     Problem List Patient Active  Problem List   Diagnosis Date Noted  . DCIS (ductal carcinoma in situ) 01/26/2020  . Low back pain 01/26/2020  . Chronic pain of both knees 09/19/2019  . Osteopenia   . Estrogen deficiency 04/14/2018  . Essential hypertension 01/10/2018  . Gastroesophageal reflux disease 01/10/2018  .  Mild intermittent asthma without complication 98/33/8250  . Pain in left lower leg 03/03/2017  . Family history of colon cancer 02/23/2017  . Herpes simplex vulvovaginitis 02/18/2017  . Neuropathy 02/18/2017  . Elevated liver enzymes 02/17/2017  . Need for Streptococcus pneumoniae vaccination 02/17/2017  . Chondromalacia of left patella 12/28/2016  . Synovitis of left knee 12/28/2016  . Seasonal allergic rhinitis due to fungal spores 09/25/2016  . Bone mass 09/24/2016  . Chronic hip pain 09/24/2016  . Dark yellow-colored urine 09/24/2016  . Low oxygen saturation 09/24/2016  . Other specified postprocedural states 01/17/2015  . Abnormal stress electrocardiogram test 10/20/2013  . Malignant neoplasm of female breast ([place]) 12/15/2010  . Breast mass 12/11/2010  . Family history of malignant neoplasm of breast 12/11/2010     Janene Harvey, PT, DPT 03/21/20 10:13 AM   [facility] [address]  [place], [place], [address] Phone: [phone]   Fax:  [phone]  Name: Alyzza Andringa MRN: [id number] Date of Birth: Apr 26, 1951

## 2020-03-22 ENCOUNTER — Ambulatory Visit (INDEPENDENT_AMBULATORY_CARE_PROVIDER_SITE_OTHER): Payer: 59 | Admitting: Podiatry

## 2020-03-22 DIAGNOSIS — M778 Other enthesopathies, not elsewhere classified: Secondary | ICD-10-CM

## 2020-03-22 DIAGNOSIS — M2012 Hallux valgus (acquired), left foot: Secondary | ICD-10-CM

## 2020-03-22 DIAGNOSIS — M2141 Flat foot [pes planus] (acquired), right foot: Secondary | ICD-10-CM | POA: Diagnosis not present

## 2020-03-22 DIAGNOSIS — M2142 Flat foot [pes planus] (acquired), left foot: Secondary | ICD-10-CM

## 2020-03-22 DIAGNOSIS — G5762 Lesion of plantar nerve, left lower limb: Secondary | ICD-10-CM

## 2020-03-22 DIAGNOSIS — M25872 Other specified joint disorders, left ankle and foot: Secondary | ICD-10-CM | POA: Diagnosis not present

## 2020-03-22 DIAGNOSIS — M21612 Bunion of left foot: Secondary | ICD-10-CM

## 2020-03-22 DIAGNOSIS — Q66221 Congenital metatarsus adductus, right foot: Secondary | ICD-10-CM

## 2020-03-22 DIAGNOSIS — Q66222 Congenital metatarsus adductus, left foot: Secondary | ICD-10-CM

## 2020-03-23 NOTE — Progress Notes (Signed)
Subjective:  Patient ID: Tonya [name], female    DOB: 12/20/1950,  MRN: [id number]  Chief Complaint  Patient presents with  . Foot Pain    Left foot pain is getting worse    69 y.o. female presents with the above complaint. History confirmed with patient.  Her left foot pain is getting worse.  She has previously been seeing Dr. Jacqualyn Posey and has had custom orthotics made.  The custom orthotics of an adjusted several times and are not getting better.  They still increased pain under the second metatarsal and burning in the plantar foot.  She states that her toes feel like they are popping or about 2, out of the joints when she walks.  She has had x-rays before with inquires of perhaps further imaging such as an MRI would be beneficial.  She also has a history of common peroneal nerve injury several years ago and her foot pain developed not long after this.  Majority of the nerve pain had improved.  Did not have neurodiagnostic testing but did see a neurologist in [place] which she was unsatisfied with.  She feels like when she walks that her foot is slapping the ground quickly.  Objective:  Physical Exam: warm, good capillary refill, no trophic changes or ulcerative lesions, normal DP and PT pulses and normal sensory exam.  Strength of the anterior lateral compartments appears to be 5 out of 5 and symmetric bilaterally.  She has hallux valgus and metatarsus adductus with pes planus bilaterally.  There is pain with dorsal drawer test which does not produce dislocation but pain along the plantar plate of the second left metatarsal phalangeal joint.  There is no clear evidence of neuroma or neuritis in the plantar forefoot.  Mild digital contractures are present.  Radiographs: X-ray of the left foot: Reviewed her most recent radiographs which shows pes planus with decreased calcaneal inclination, increased talar declination and moderate to severe metatarsus adductus with hallux valgus formation and  lateral deviation of the digits.  No clear abnormalities within the second MTPJ on these radiographs from 01/26/2020 Assessment:   1. Predislocation syndrome of metatarsophalangeal joint of left foot   2. Capsulitis of left foot   3. Morton neuroma, left   4. Pes planus of both feet   5. Metatarsus adductus of both feet   6. Hallux valgus with bunions, left      Plan:  Patient was evaluated and treated and all questions answered.  Long discussion with her regarding her chronic left foot pain which appears to be from mixed etiologies.  I believe the primary issue is centered around the second MTPJ and she appears to have symptoms of predislocation syndrome.  She has pain with palpation of the plantar plate, I do believe this is likely intact as it does not produce a clear dorsal dislocation with a Lachman's test.  This is exacerbated by an insufficient first ray with hallux valgus formation and the presence of metatarsal adductus and pes planus.  Unclear if her previous nerve issues and injury are still playing a role.  She does not have noticeable strength deficits in the anterior compartment the left side but she does have subjective feelings of the foot slap on the floor.  Discussed with her that we should focus on the forefoot this time and try to address this issue before dealing with any residual nerve issues.  I recommend that we evaluate the second MTPJ and plantar forefoot with a MRI which I have ordered  for [facility].  She will follow me in 4 to 5 weeks following the MRI.  Return in about 5 weeks (around 04/26/2020).

## 2020-03-26 ENCOUNTER — Other Ambulatory Visit: Payer: Self-pay

## 2020-03-26 ENCOUNTER — Ambulatory Visit: Payer: Managed Care, Other (non HMO) | Attending: Family Medicine

## 2020-03-26 DIAGNOSIS — M542 Cervicalgia: Secondary | ICD-10-CM

## 2020-03-26 DIAGNOSIS — R293 Abnormal posture: Secondary | ICD-10-CM

## 2020-03-26 DIAGNOSIS — M546 Pain in thoracic spine: Secondary | ICD-10-CM | POA: Diagnosis present

## 2020-03-26 DIAGNOSIS — M6281 Muscle weakness (generalized): Secondary | ICD-10-CM | POA: Diagnosis present

## 2020-03-26 DIAGNOSIS — M62838 Other muscle spasm: Secondary | ICD-10-CM

## 2020-03-26 NOTE — Therapy (Signed)
[facility] [address]  [place], [place], [address] Phone: [phone]   Fax:  [phone]  Physical Therapy Treatment  Patient Details  Name: Tonya [name] MRN: [id number] Date of Birth: 1951/02/12 Referring Provider (PT): Crosby Oyster [place], [place]   Encounter Date: 03/26/2020   PT End of Session - 03/26/20 1332    Visit Number 10    Number of Visits 13    Date for PT Re-Evaluation 04/03/20    Authorization Type [employer]    PT Start Time 1321    PT Stop Time 1403    PT Time Calculation (min) 42 min    Activity Tolerance Patient tolerated treatment well    Behavior During Therapy [facility] for tasks assessed/performed           Past Medical History:  Diagnosis Date  . Hypertension   . Osteopenia     Past Surgical History:  Procedure Laterality Date  . ABDOMINAL HYSTERECTOMY  2015  . AUGMENTATION MAMMAPLASTY Left   . BREAST BIOPSY Right 02/2019  . BREAST IMPLANT EXCHANGE    . MASTECTOMY Left   . REDUCTION MAMMAPLASTY Right   . TONSILLECTOMY AND ADENOIDECTOMY  1958    There were no vitals filed for this visit.   Subjective Assessment - 03/26/20 1324    Subjective Pt. noting L-sided mid back/lats "catching" pain intermittently since yesterday.    Pertinent History osteopenia, HTN, L mastectomy    Diagnostic tests none recent    Patient Stated Goals improve pain    Currently in Pain? Yes    Pain Location Back    Pain Orientation Mid;Left    Pain Descriptors / Indicators Aching    Pain Type Chronic pain    Multiple Pain Sites No    Pain Score 3    Pain Location Rib cage    Pain Orientation Left    Pain Descriptors / Indicators --   "catching"   Pain Type Chronic pain              [facility] PT Assessment - 03/26/20 0001      AROM   AROM Assessment Site Cervical    Cervical Flexion 36    Cervical Extension 45    Cervical - Right Side Bend 26   pain free    Cervical - Left Side Bend 26   pain in  L scapula   Cervical - Right Rotation 46    Cervical - Left Rotation 53      Strength   Strength Assessment Site Shoulder    Right/Left Shoulder Right;Left    Right Shoulder Flexion 4+/5    Right Shoulder ABduction 4+/5    Right Shoulder Internal Rotation 4+/5    Right Shoulder External Rotation 4+/5    Left Shoulder Flexion 4+/5    Left Shoulder ABduction 4+/5    Left Shoulder Internal Rotation 4+/5    Left Shoulder External Rotation 4+/5                         [facility] Adult PT Treatment/Exercise - 03/26/20 0001      Neck Exercises: Machines for Strengthening   UBE (Upper Arm Bike) L2.0 x 3 min forward/3 min back      Manual Therapy   Manual Therapy Soft tissue mobilization;Myofascial release    Manual therapy comments sitting     Soft tissue mobilization STM to L lats, L posterior/inferior shoulder, L serratus anterior  PT Short Term Goals - 03/04/20 1528      PT SHORT TERM GOAL #1   Title Patient to be independent with initial HEP.    Time 3    Period Weeks    Status Achieved    Target Date 03/13/20             PT Long Term Goals - 03/26/20 1334      PT LONG TERM GOAL #1   Title Patient to be independent with advanced HEP.    Time 6    Period Weeks    Status Partially Met      PT LONG TERM GOAL #2   Title Patient to demonstrate cervical AROM WFL and without pain limiting.    Time 6    Period Weeks    Status Partially Met      PT LONG TERM GOAL #3   Title Patient to demonstrate B shoulder strength >/=4+/5.    Time 6    Period Weeks    Status Achieved      PT LONG TERM GOAL #4   Title Patient to report 80% improvement in tolerance for cleaning chores.    Time 6    Period Weeks    Status Partially Met   03/26/20:  Pt. noting 60% improvement in tolerance     PT LONG TERM GOAL #5   Title Patient to report ability to lift her granddaughter without pain.    Time 6    Period Weeks    Status On-going    03/26/20:  Pt. noting limitation by pain between shoulder blades after lifting granddaughter                Plan - 03/26/20 1332    Clinical Impression Statement Arieona has made good progress with physical therapy.  Able to demonstrate significant progress with cervical AROM in all plane of movement most notable in extension and B rotation with less pain noted at end ranges of motion.  LTG #2 partially met.  Pt. has met LTG #3 as she demonstrates at least a 4+/5 strength in all B shoulder strength with MMT.  Notes 60% improved tolerance for cleaning chores in home thus LTG #4 partially met.  LTG #5 still ongoing as pt. feels limited by pain between her shoulder blades after lifting her granddaughter.  Pt. primary complaint today was L-sided rib/lats pain which was not ttp with MT however has been bothering her recently.  Pt. noting 40% overall improvement in pain since starting therapy.  Will plan to continue postural strengthening and addressing L-sided rib pain at upcoming session.    Comorbidities osteopenia, HTN, L mastectomy    Rehab Potential Good    PT Frequency 2x / week    PT Duration 6 weeks    PT Treatment/Interventions ADLs/Self Care Home Management;Cryotherapy;Moist Heat;Therapeutic exercise;Therapeutic activities;Functional mobility training;Neuromuscular re-education;Patient/family education;Manual techniques;Taping;Energy conservation;Dry needling;Passive range of motion    PT Next Visit Plan progress cervical ROM and periscapular strengthening    Consulted and Agree with Plan of Care Patient           Patient will benefit from skilled therapeutic intervention in order to improve the following deficits and impairments:  Hypomobility, Decreased scar mobility, Decreased activity tolerance, Decreased strength, Pain, Impaired UE functional use, Increased fascial restricitons, Increased muscle spasms, Improper body mechanics, Decreased range of motion, Postural dysfunction,  Impaired flexibility  Visit Diagnosis: Cervicalgia  Other muscle spasm  Muscle weakness (generalized)  Abnormal posture  Problem List Patient Active Problem List   Diagnosis Date Noted  . DCIS (ductal carcinoma in situ) 01/26/2020  . Low back pain 01/26/2020  . Chronic pain of both knees 09/19/2019  . Osteopenia   . Estrogen deficiency 04/14/2018  . Essential hypertension 01/10/2018  . Gastroesophageal reflux disease 01/10/2018  . Mild intermittent asthma without complication 07/02/347  . Pain in left lower leg 03/03/2017  . Family history of colon cancer 02/23/2017  . Herpes simplex vulvovaginitis 02/18/2017  . Neuropathy 02/18/2017  . Elevated liver enzymes 02/17/2017  . Need for Streptococcus pneumoniae vaccination 02/17/2017  . Chondromalacia of left patella 12/28/2016  . Synovitis of left knee 12/28/2016  . Seasonal allergic rhinitis due to fungal spores 09/25/2016  . Bone mass 09/24/2016  . Chronic hip pain 09/24/2016  . Dark yellow-colored urine 09/24/2016  . Low oxygen saturation 09/24/2016  . Other specified postprocedural states 01/17/2015  . Abnormal stress electrocardiogram test 10/20/2013  . Malignant neoplasm of female breast ([place]) 12/15/2010  . Breast mass 12/11/2010  . Family history of malignant neoplasm of breast 12/11/2010    Bess Harvest, PTA 03/26/20 4:57 PM   [facility] [address]  [place], [place], [address] Phone: [phone]   Fax:  [phone]  Name: Seng Fouts MRN: [id number] Date of Birth: 10/24/1950

## 2020-03-28 ENCOUNTER — Ambulatory Visit: Payer: Managed Care, Other (non HMO)

## 2020-03-28 ENCOUNTER — Other Ambulatory Visit: Payer: Self-pay

## 2020-03-28 DIAGNOSIS — M542 Cervicalgia: Secondary | ICD-10-CM | POA: Diagnosis not present

## 2020-03-28 DIAGNOSIS — M62838 Other muscle spasm: Secondary | ICD-10-CM

## 2020-03-28 DIAGNOSIS — M6281 Muscle weakness (generalized): Secondary | ICD-10-CM

## 2020-03-28 DIAGNOSIS — R293 Abnormal posture: Secondary | ICD-10-CM

## 2020-03-28 NOTE — Therapy (Signed)
[facility] [address]  [place], [place], [address] Phone: [phone]   Fax:  [phone]  Physical Therapy Treatment  Patient Details  Name: Tonya [name] MRN: [id number] Date of Birth: [birth year] Referring Provider (PT): Crosby Oyster [place], [place]   Encounter Date: 03/28/2020   PT End of Session - 03/28/20 1325    Visit Number 11    Number of Visits 13    Date for PT Re-Evaluation 04/03/20    Authorization Type [employer]    PT Start Time 1316    PT Stop Time 1409   Ended visit with 10 min moist heat   PT Time Calculation (min) 53 min    Activity Tolerance Patient tolerated treatment well    Behavior During Therapy [facility] for tasks assessed/performed           Past Medical History:  Diagnosis Date  . Hypertension   . Osteopenia     Past Surgical History:  Procedure Laterality Date  . ABDOMINAL HYSTERECTOMY  2015  . AUGMENTATION MAMMAPLASTY Left   . BREAST BIOPSY Right 02/2019  . BREAST IMPLANT EXCHANGE    . MASTECTOMY Left   . REDUCTION MAMMAPLASTY Right   . TONSILLECTOMY AND ADENOIDECTOMY  1958    There were no vitals filed for this visit.   Subjective Assessment - 03/28/20 1324    Subjective Pt. reporting she had Covid-19 booster    Pertinent History osteopenia, HTN, L mastectomy    Diagnostic tests none recent    Patient Stated Goals improve pain    Currently in Pain? Yes    Pain Score 3     Pain Location Back    Pain Orientation Mid;Left    Pain Descriptors / Indicators --   " a little less sharp "   Pain Type Chronic pain    Pain Frequency Intermittent    Multiple Pain Sites No                             [facility] Adult PT Treatment/Exercise - 03/28/20 0001      Self-Care   Self-Care Other Self-Care Comments    Other Self-Care Comments  discussion of nature of patient L-sided rib pain which extends around her torso to L lower quadrant and does not seem to have  noticeable trigger and low-level pain in nature      Neck Exercises: Machines for Strengthening   UBE (Upper Arm Bike) L2.0 x 3 min forward/3 min back      Neck Exercises: Prone   Neck Retraction 10 reps;3 secs    Neck Retraction Limitations prone       Lumbar Exercises: Sidelying   Other Sidelying Lumbar Exercises B "open book" stretch 5 x 5" hold      Moist Heat Therapy   Number Minutes Moist Heat 10 Minutes    Moist Heat Location Lumbar Spine      Manual Therapy   Manual Therapy Myofascial release;Soft tissue mobilization    Manual therapy comments prone     Soft tissue mobilization STM to L lats, L thoracic parapspinals, L abdominals                     PT Short Term Goals - 03/04/20 1528      PT SHORT TERM GOAL #1   Title Patient to be independent with initial HEP.    Time 3  Period Weeks    Status Achieved    Target Date 03/13/20             PT Long Term Goals - 03/26/20 1334      PT LONG TERM GOAL #1   Title Patient to be independent with advanced HEP.    Time 6    Period Weeks    Status Partially Met      PT LONG TERM GOAL #2   Title Patient to demonstrate cervical AROM WFL and without pain limiting.    Time 6    Period Weeks    Status Partially Met      PT LONG TERM GOAL #3   Title Patient to demonstrate B shoulder strength >/=4+/5.    Time 6    Period Weeks    Status Achieved      PT LONG TERM GOAL #4   Title Patient to report 80% improvement in tolerance for cleaning chores.    Time 6    Period Weeks    Status Partially Met   03/26/20:  Pt. noting 60% improvement in tolerance     PT LONG TERM GOAL #5   Title Patient to report ability to lift her granddaughter without pain.    Time 6    Period Weeks    Status On-going   03/26/20:  Pt. noting limitation by pain between shoulder blades after lifting granddaughter                Plan - 03/28/20 1326    Clinical Impression Statement Tonya [name] with concern over L -sided rib  pain which "wraps around" to L anterior/lateral lower quadrant.  Did note that she is concerned this pain is related to her kidneys however does not notice any pattern to this pain.  Pt. with increased tension in L-sided thoracic paraspinals, lats without significant ttp.  Pain did not limit cervical ROM activities in session however pt. did require significant time with discussion and with plan to mention this issue to MD at upcoming f/u.  Tonya [name] also concerned over lingering L shoulder injection sight redness and firmness (size of a silver dollar) from Covid-19 booster vaccine she received Monday.  Pt. noting this injection is beginning to itch and with plans to visit primary care across hall after leaving PT session today.    Comorbidities osteopenia, HTN, L mastectomy    Rehab Potential Good    PT Frequency 2x / week    PT Duration 6 weeks    PT Treatment/Interventions ADLs/Self Care Home Management;Cryotherapy;Moist Heat;Therapeutic exercise;Therapeutic activities;Functional mobility training;Neuromuscular re-education;Patient/family education;Manual techniques;Taping;Energy conservation;Dry needling;Passive range of motion    PT Next Visit Plan progress cervical ROM and periscapular strengthening    Consulted and Agree with Plan of Care Patient           Patient will benefit from skilled therapeutic intervention in order to improve the following deficits and impairments:  Hypomobility, Decreased scar mobility, Decreased activity tolerance, Decreased strength, Pain, Impaired UE functional use, Increased fascial restricitons, Increased muscle spasms, Improper body mechanics, Decreased range of motion, Postural dysfunction, Impaired flexibility  Visit Diagnosis: Cervicalgia  Other muscle spasm  Muscle weakness (generalized)  Abnormal posture     Problem List Patient Active Problem List   Diagnosis Date Noted  . DCIS (ductal carcinoma in situ) 01/26/2020  . Low back pain 01/26/2020    . Chronic pain of both knees 09/19/2019  . Osteopenia   . Estrogen deficiency 04/14/2018  . Essential hypertension 01/10/2018  .  Gastroesophageal reflux disease 01/10/2018  . Mild intermittent asthma without complication 25/75/0518  . Pain in left lower leg 03/03/2017  . Family history of colon cancer 02/23/2017  . Herpes simplex vulvovaginitis 02/18/2017  . Neuropathy 02/18/2017  . Elevated liver enzymes 02/17/2017  . Need for Streptococcus pneumoniae vaccination 02/17/2017  . Chondromalacia of left patella 12/28/2016  . Synovitis of left knee 12/28/2016  . Seasonal allergic rhinitis due to fungal spores 09/25/2016  . Bone mass 09/24/2016  . Chronic hip pain 09/24/2016  . Dark yellow-colored urine 09/24/2016  . Low oxygen saturation 09/24/2016  . Other specified postprocedural states 01/17/2015  . Abnormal stress electrocardiogram test 10/20/2013  . Malignant neoplasm of female breast ([place]) 12/15/2010  . Breast mass 12/11/2010  . Family history of malignant neoplasm of breast 12/11/2010    Bess Harvest, PTA 03/28/20 5:20 PM   [facility] [address]  [place], [place], [address] Phone: [phone]   Fax:  [phone]  Name: Tonya [name] MRN: [id number] Date of Birth: December 12, 1950

## 2020-04-02 ENCOUNTER — Ambulatory Visit: Payer: Managed Care, Other (non HMO) | Admitting: Physical Therapy

## 2020-04-02 ENCOUNTER — Other Ambulatory Visit: Payer: Self-pay

## 2020-04-02 ENCOUNTER — Encounter: Payer: Self-pay | Admitting: Physical Therapy

## 2020-04-02 DIAGNOSIS — M542 Cervicalgia: Secondary | ICD-10-CM | POA: Diagnosis not present

## 2020-04-02 DIAGNOSIS — M6281 Muscle weakness (generalized): Secondary | ICD-10-CM

## 2020-04-02 DIAGNOSIS — M546 Pain in thoracic spine: Secondary | ICD-10-CM

## 2020-04-02 DIAGNOSIS — M62838 Other muscle spasm: Secondary | ICD-10-CM

## 2020-04-02 DIAGNOSIS — R293 Abnormal posture: Secondary | ICD-10-CM

## 2020-04-02 NOTE — Therapy (Signed)
[facility] [address]  [place], [place], [address] Phone: [phone]   Fax:  [phone]  Physical Therapy Treatment  Patient Details  Name: Tonya [name] MRN: [id number] Date of Birth: January 21, 1951 Referring Provider (PT): Crosby Oyster [place], [place]   Encounter Date: 04/02/2020   PT End of Session - 04/02/20 1208    Visit Number 12    Number of Visits 20    Date for PT Re-Evaluation 04/30/20    Authorization Type [employer]    PT Start Time 1102    PT Stop Time 1149    PT Time Calculation (min) 47 min    Activity Tolerance Patient tolerated treatment well    Behavior During Therapy [facility] for tasks assessed/performed           Past Medical History:  Diagnosis Date   Hypertension    Osteopenia     Past Surgical History:  Procedure Laterality Date   ABDOMINAL HYSTERECTOMY  2015   AUGMENTATION MAMMAPLASTY Left    BREAST BIOPSY Right 02/2019   BREAST IMPLANT EXCHANGE     MASTECTOMY Left    REDUCTION MAMMAPLASTY Right    TONSILLECTOMY AND ADENOIDECTOMY  1958    There were no vitals filed for this visit.   Subjective Assessment - 04/02/20 1105    Subjective In general, she is feeling better. Notes that she is able to address her neck pain with ther-ex. Upper back is bothering her more today but not sure why. Also still having a constant dull ache in the L lateral ribcage. Reports 60% improvement since initial eval.    Pertinent History osteopenia, HTN, L mastectomy    Diagnostic tests none recent    Patient Stated Goals improve pain    Currently in Pain? Yes    Pain Score 4     Pain Location Back    Pain Orientation Upper    Pain Descriptors / Indicators Dull    Pain Type Chronic pain    Pain Score 3    Pain Location Rib cage    Pain Orientation Left    Pain Descriptors / Indicators Constant    Pain Type Chronic pain              [facility] PT Assessment - 04/02/20 0001      Assessment    Medical Diagnosis Strain of L UT    Referring Provider (PT) [name], DO    Onset Date/Surgical Date --   since 2017     AROM   AROM Assessment Site Thoracic    Cervical Flexion 36    Cervical Extension 45    Cervical - Right Side Bend 26    Cervical - Left Side Bend 26    Cervical - Right Rotation 46    Cervical - Left Rotation 53    Thoracic Flexion WFL   "stretch"   Thoracic Extension mildly limited    Thoracic - Right Side Bend moderately limited   pain in the L flank   Thoracic - Left Side Bend moderately limited   pain in the L flank   Thoracic - Right Rotation moderately limited    Thoracic - Left Rotation moderately limited   "pulling"     Strength   Right Shoulder Flexion 4+/5    Right Shoulder ABduction 4+/5    Right Shoulder Internal Rotation 4+/5    Right Shoulder External Rotation 4+/5    Left Shoulder Flexion 4+/5  Left Shoulder ABduction 4+/5    Left Shoulder Internal Rotation 4+/5    Left Shoulder External Rotation 4+/5                         [facility] Adult PT Treatment/Exercise - 04/02/20 0001      Neck Exercises: Machines for Strengthening   UBE (Upper Arm Bike) L2.0 x 3 min forward/3 min back      Neck Exercises: Standing   Other Standing Exercises thoracic extension at wall 10x   cues for form     Neck Exercises: Seated   Other Seated Exercise thoracic extension over back of chair 5x to tolerance   report of relieving cavitation     Shoulder Exercises: Stretch   Corner Stretch 2 reps;30 seconds    Corner Stretch Limitations L UE 90/90, B 90/90 in doorway      Manual Therapy   Manual Therapy Joint mobilization    Manual therapy comments prone    Joint Mobilization central PAs at T3-10 grade II-IV to tolerance   most tenderness at T6-8                 PT Education - 04/02/20 1207    Education Details discussion on objective progress and remaining impairments; update to HEP    Person(s) Educated Patient     Methods Explanation;Demonstration;Tactile cues;Verbal cues;Handout    Comprehension Verbalized understanding;Returned demonstration            PT Short Term Goals - 04/02/20 1111      PT SHORT TERM GOAL #1   Title Patient to be independent with initial HEP.    Time 3    Period Weeks    Status Achieved    Target Date 03/13/20             PT Long Term Goals - 04/02/20 1111      PT LONG TERM GOAL #1   Title Patient to be independent with advanced HEP.    Time 4    Period Weeks    Status Partially Met   met for current   Target Date 04/30/20      PT LONG TERM GOAL #2   Title Patient to demonstrate cervical AROM WFL and without pain limiting.    Time 4    Period Weeks    Status Partially Met   mild pain remaining   Target Date 04/30/20      PT LONG TERM GOAL #3   Title Patient to demonstrate B shoulder strength >/=4+/5.    Time 6    Period Weeks    Status Achieved      PT LONG TERM GOAL #4   Title Patient to report 80% improvement in tolerance for cleaning chores.    Time 4    Period Weeks    Status Partially Met   04/02/20:  Pt. noting 60% improvement in tolerance for cooking, laundry   Target Date 04/30/20      PT LONG TERM GOAL #5   Title Patient to report ability to lift her granddaughter without pain.    Time 4    Period Weeks    Status On-going   reporting delayed pain in her back after lifting her granddaughter, slightly improved   Target Date 04/30/20      Additional Long Term Goals   Additional Long Term Goals Yes      PT LONG TERM GOAL #6   Title Patient to  demonstrate thoracic AROM WFL and without pain limiting.    Time 4    Period Weeks    Status New    Target Date 04/30/20                 Plan - 04/02/20 1208    Clinical Impression Statement Patient reporting 60% improvement since initial eval. Noting good ability to manage her neck pain with HEP. However, still noting upper/midback and L latera ribcage pain remaining. According to  measurements carried over from 03/26/20, patient has demonstrating improvement in all planes of cervical AROM and has also met shoulder strength goal. Thoracic AROM is limited and painful, indicating need to continue addressing this. Patient reports about 60% improvement in tolerance for cooking and laundry. Does note slightly improved pain after lifting her granddaughter. Reviewed ther-ex to address thoracic mobility and pec flexibility. Also addressed joint mobility, with most tenderness noted at T6-8. Patient reported improvement in pain at end of session. Patient is demonstrating good progress towards goals. Would continue to benefit from skilled PT services 2x/week for 4 weeks to address remaining impairments.    Comorbidities osteopenia, HTN, L mastectomy    Rehab Potential Good    PT Frequency 2x / week    PT Duration 4 weeks    PT Treatment/Interventions ADLs/Self Care Home Management;Cryotherapy;Moist Heat;Therapeutic exercise;Therapeutic activities;Functional mobility training;Neuromuscular re-education;Patient/family education;Manual techniques;Taping;Energy conservation;Dry needling;Passive range of motion    PT Next Visit Plan progress thoracic joint mobility and ROM    Consulted and Agree with Plan of Care Patient           Patient will benefit from skilled therapeutic intervention in order to improve the following deficits and impairments:  Hypomobility, Decreased scar mobility, Decreased activity tolerance, Decreased strength, Pain, Impaired UE functional use, Increased fascial restricitons, Increased muscle spasms, Improper body mechanics, Decreased range of motion, Postural dysfunction, Impaired flexibility  Visit Diagnosis: Pain in thoracic spine  Cervicalgia  Other muscle spasm  Muscle weakness (generalized)  Abnormal posture     Problem List Patient Active Problem List   Diagnosis Date Noted   DCIS (ductal carcinoma in situ) 01/26/2020   Low back pain  01/26/2020   Chronic pain of both knees 09/19/2019   Osteopenia    Estrogen deficiency 04/14/2018   Essential hypertension 01/10/2018   Gastroesophageal reflux disease 01/10/2018   Mild intermittent asthma without complication 09/38/1829   Pain in left lower leg 03/03/2017   Family history of colon cancer 02/23/2017   Herpes simplex vulvovaginitis 02/18/2017   Neuropathy 02/18/2017   Elevated liver enzymes 02/17/2017   Need for Streptococcus pneumoniae vaccination 02/17/2017   Chondromalacia of left patella 12/28/2016   Synovitis of left knee 12/28/2016   Seasonal allergic rhinitis due to fungal spores 09/25/2016   Bone mass 09/24/2016   Chronic hip pain 09/24/2016   Dark yellow-colored urine 09/24/2016   Low oxygen saturation 09/24/2016   Other specified postprocedural states 01/17/2015   Abnormal stress electrocardiogram test 10/20/2013   Malignant neoplasm of female breast ([place]) 12/15/2010   Breast mass 12/11/2010   Family history of malignant neoplasm of breast 12/11/2010     Janene Harvey, PT, DPT 04/02/20 12:12 PM   [facility] [address]  [place], [place], [address] Phone: [phone]   Fax:  [phone]  Name: Glendi Mohiuddin MRN: [id number] Date of Birth: 1950-07-21

## 2020-04-05 ENCOUNTER — Ambulatory Visit
Admission: RE | Admit: 2020-04-05 | Discharge: 2020-04-05 | Disposition: A | Discharge status: Home / Self Care | Payer: Managed Care, Other (non HMO) | Source: Ambulatory Visit | Attending: Podiatry | Admitting: Podiatry

## 2020-04-05 ENCOUNTER — Other Ambulatory Visit: Payer: Self-pay

## 2020-04-05 DIAGNOSIS — M778 Other enthesopathies, not elsewhere classified: Secondary | ICD-10-CM

## 2020-04-05 DIAGNOSIS — M25872 Other specified joint disorders, left ankle and foot: Secondary | ICD-10-CM

## 2020-04-05 DIAGNOSIS — G5762 Lesion of plantar nerve, left lower limb: Secondary | ICD-10-CM

## 2020-04-05 IMAGING — MR MR TOES*L* W/O CM
4 of 5 series · 22 of 40 positions shown · non-contrast
Comparison: Left foot x-rays dated [DATE].

CLINICAL DATA: Chronic pain around the second MTP joint.

EXAM:
MRI OF THE LEFT TOES WITHOUT CONTRAST
TECHNIQUE: Multiplanar, multisequence MR imaging of the left forefoot was
performed. No intravenous contrast was administered.

[Series 4: T1 · coronal · 3.0mm · 0.20mm/px · 3 of 47 slices shown (1 of 2)]
[im 5/47]
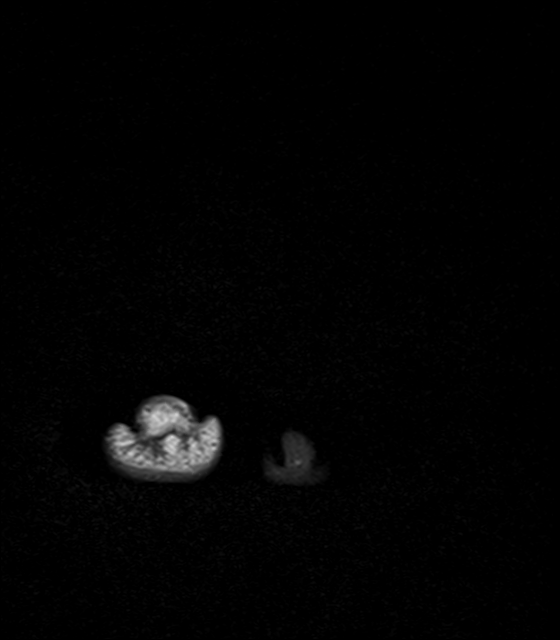
[im 24/47]
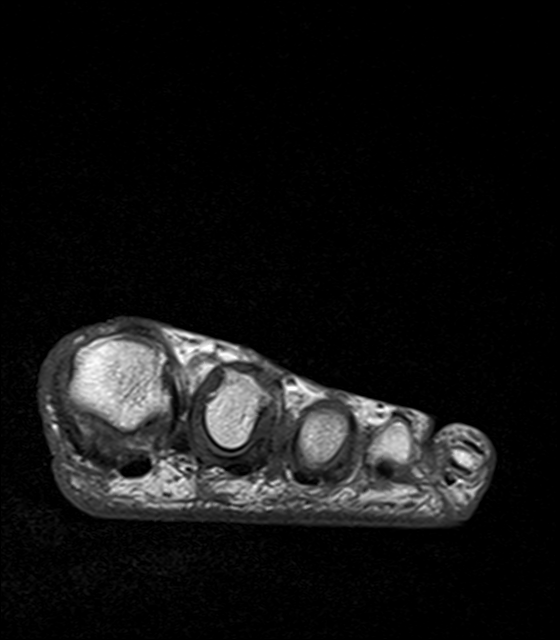
[im 42/47]
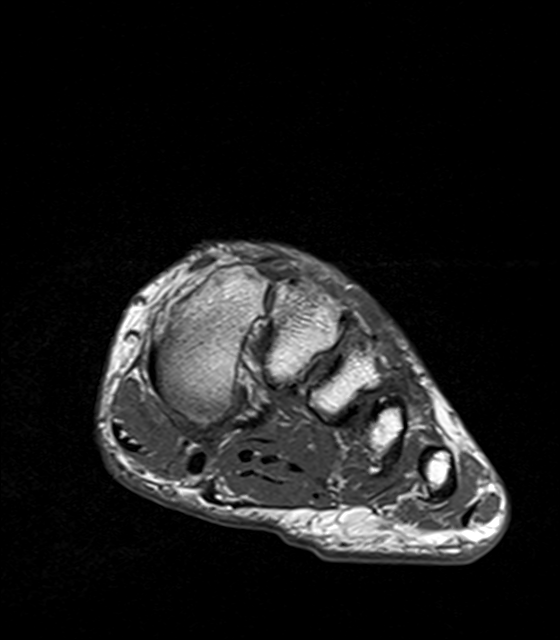

[Series 5: T2 fat-sat · coronal · 3.0mm · 0.25mm/px · 11 of 47 slices shown (1 of 2)]
[im 1/47]
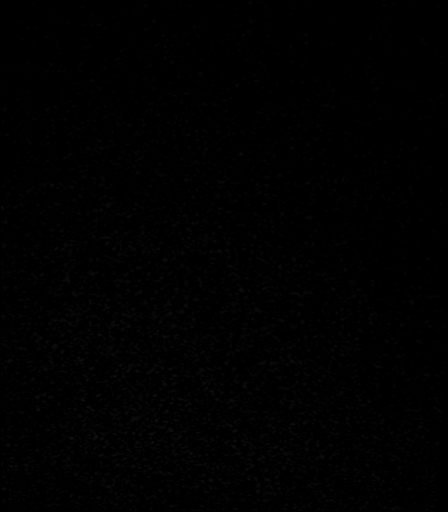
[im 5/47]
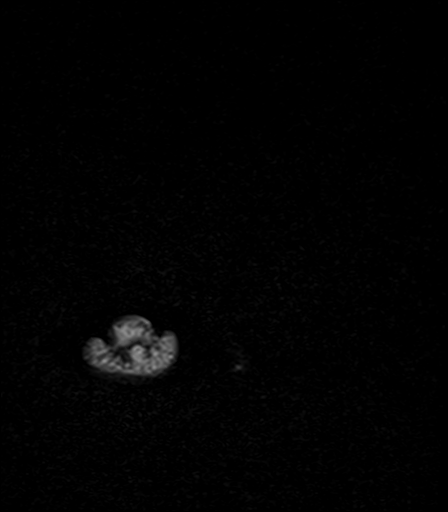
[im 10/47]
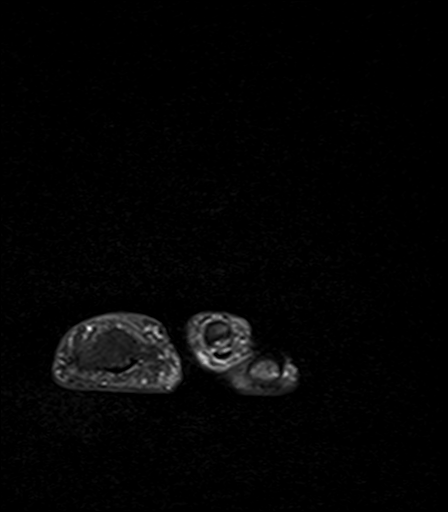
[im 14/47]
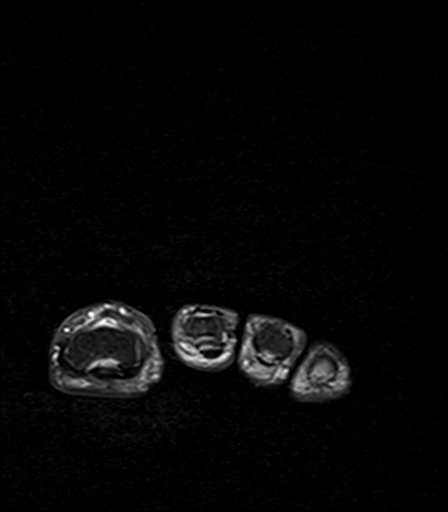
[im 19/47]
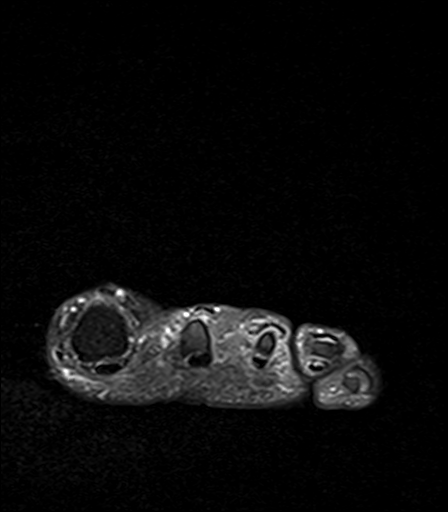
[im 24/47]
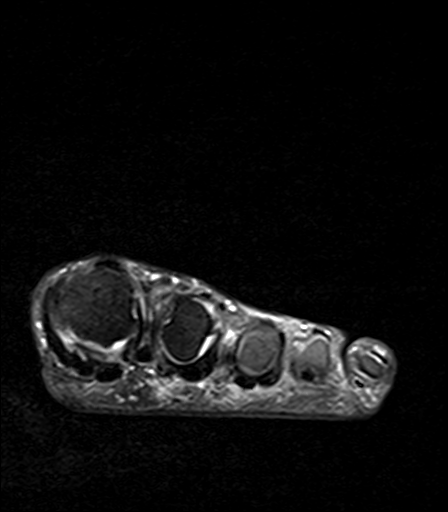
[im 28/47]
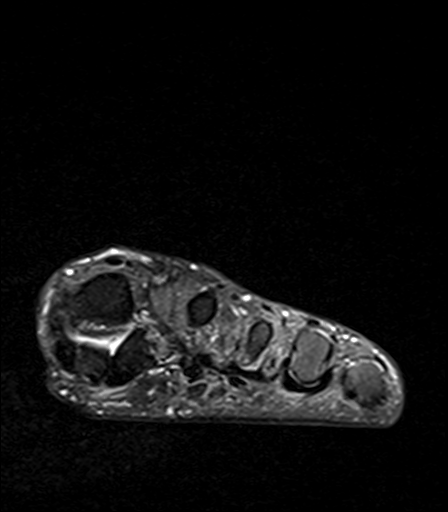
[im 33/47]
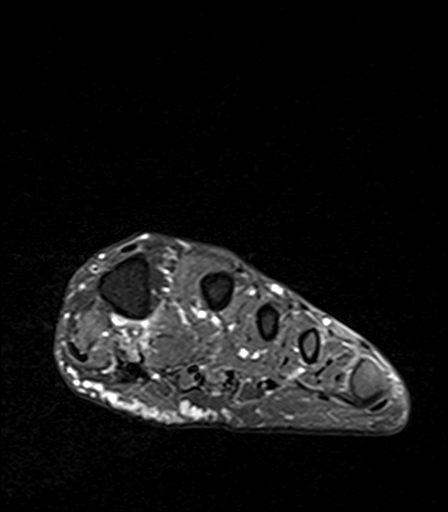
[im 37/47]
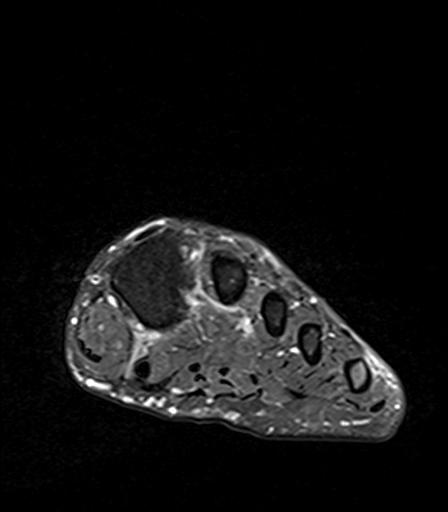
[im 42/47]
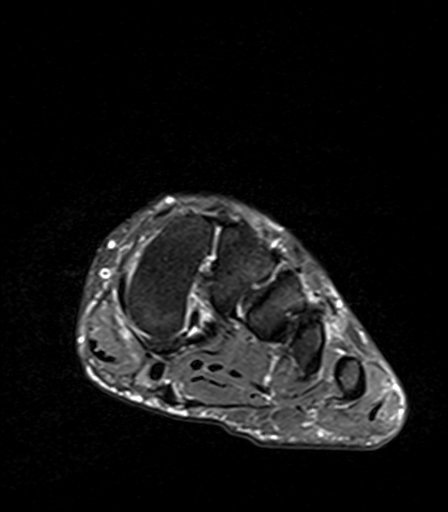
[im 47/47]
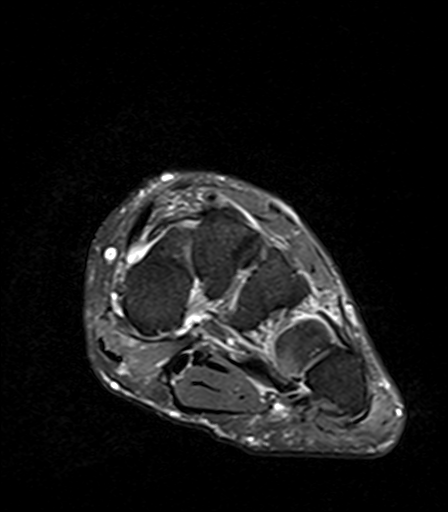

[Series 6: T2 fat-sat · axial · 3.0mm · 0.35mm/px · z∈[-64,+17]mm · 5 of 22 slices shown (2 of 2)]
[im 1/22]
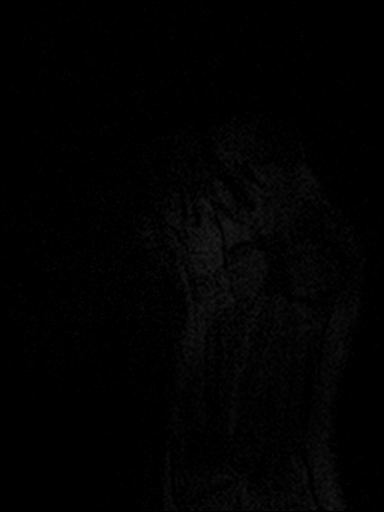
[im 6/22]
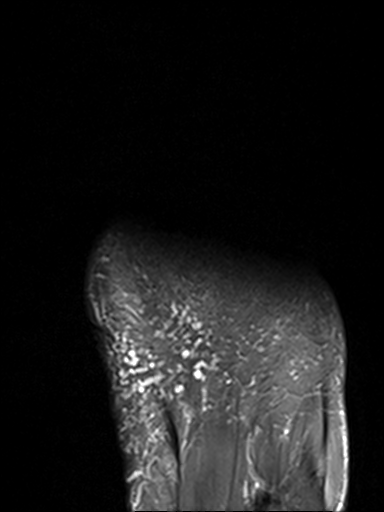
[im 11/22]
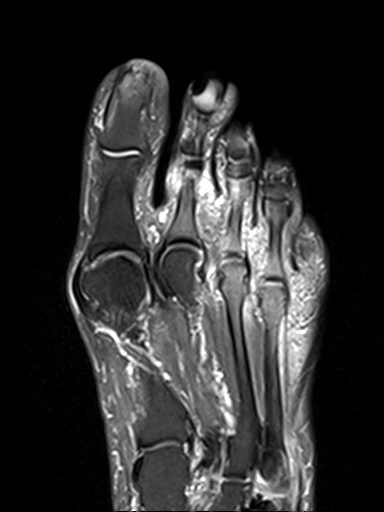
[im 16/22]
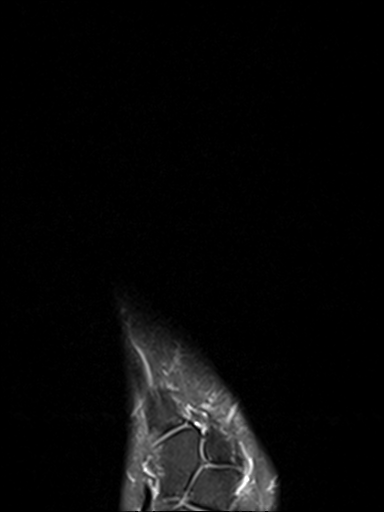
[im 22/22]
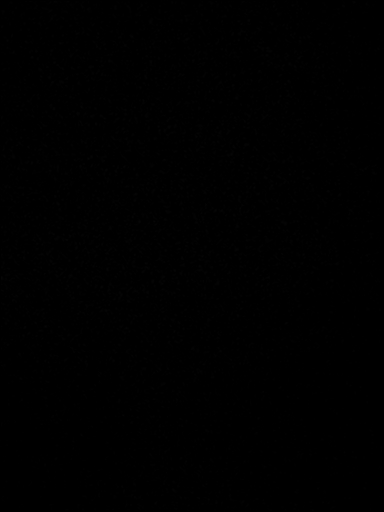

[Series 7: T1 · axial · 3.0mm · 0.35mm/px · z∈[-64,+17]mm · 3 of 22 slices shown (2 of 2)]
[im 1/22]
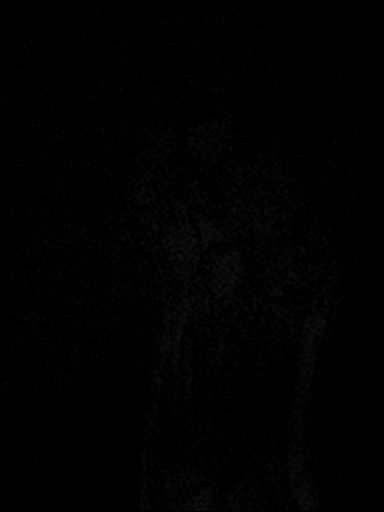
[im 11/22]
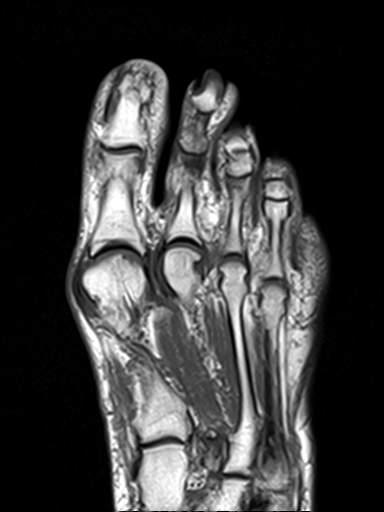
[im 22/22]
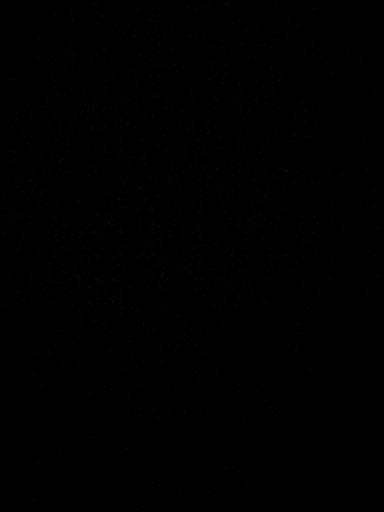

[22 of 40 positions shown; findings below may reference images not displayed]

FINDINGS: Bones/Joint/Cartilage

No marrow signal abnormality. No fracture or dislocation. Mild
hallux valgus deformity and first MTP joint degenerative changes. No
joint effusion.

Ligaments

Collateral ligaments are intact.  Lisfranc ligament is intact.

Muscles and Tendons
Flexor and extensor tendons are intact. No muscle edema or atrophy.

Soft tissue
1.3 cm dumbbell-shaped T2 hypointense, T1 isointense mass between
the second and third MTP joints, consistent with neuroma (series 4,
image 25; series 5, image 25). No fluid collection or hematoma.
IMPRESSION: 1. 1.3 cm neuroma between the second and third MTP joints.
2. Mild hallux valgus deformity and first MTP joint degenerative
changes.

## 2020-04-08 ENCOUNTER — Ambulatory Visit: Payer: Managed Care, Other (non HMO)

## 2020-04-08 ENCOUNTER — Other Ambulatory Visit: Payer: Self-pay

## 2020-04-08 DIAGNOSIS — M542 Cervicalgia: Secondary | ICD-10-CM

## 2020-04-08 DIAGNOSIS — M546 Pain in thoracic spine: Secondary | ICD-10-CM

## 2020-04-08 DIAGNOSIS — M62838 Other muscle spasm: Secondary | ICD-10-CM

## 2020-04-08 DIAGNOSIS — M6281 Muscle weakness (generalized): Secondary | ICD-10-CM

## 2020-04-08 DIAGNOSIS — R293 Abnormal posture: Secondary | ICD-10-CM

## 2020-04-08 NOTE — Therapy (Signed)
[facility] [address]  [place], [place], [address] Phone: [phone]   Fax:  [phone]  Physical Therapy Treatment  Patient Details  Name: Tonya [name] MRN: [id number] Date of Birth: Sep 20, 1950 Referring Provider (PT): Crosby [place], [place]   Encounter Date: 04/08/2020   PT End of Session - 04/08/20 1455    Visit Number 13    Number of Visits 20    Date for PT Re-Evaluation 04/30/20    Authorization Type [employer]    PT Start Time 1447    PT Stop Time 1525    PT Time Calculation (min) 38 min    Activity Tolerance Patient tolerated treatment well    Behavior During Therapy [facility] for tasks assessed/performed           Past Medical History:  Diagnosis Date   Hypertension    Osteopenia     Past Surgical History:  Procedure Laterality Date   ABDOMINAL HYSTERECTOMY  2015   AUGMENTATION MAMMAPLASTY Left    BREAST BIOPSY Right 02/2019   BREAST IMPLANT EXCHANGE     MASTECTOMY Left    REDUCTION MAMMAPLASTY Right    TONSILLECTOMY AND ADENOIDECTOMY  1958    There were no vitals filed for this visit.   Subjective Assessment - 04/08/20 1453    Subjective Pt. noting she wrapped up dishes after cleaning out buffet table which increased her pain.    Pertinent History osteopenia, HTN, L mastectomy    Diagnostic tests none recent    Patient Stated Goals improve pain    Currently in Pain? Yes    Pain Score 4     Pain Location Back    Pain Orientation Upper;Right;Left    Pain Descriptors / Indicators Aching    Pain Type Chronic pain    Multiple Pain Sites No                             [facility] Adult PT Treatment/Exercise - 04/08/20 0001      Neck Exercises: Machines for Strengthening   UBE (Upper Arm Bike) L2.0 x 3 min forward/3 min back      Neck Exercises: Theraband   Shoulder External Rotation 10 reps    Shoulder External Rotation Limitations red TB     Horizontal  ABduction 15 reps;Red    Horizontal ABduction Limitations B seated       Neck Exercises: Seated   Other Seated Exercise thoracic extension over back of chair 5x to tolerance      Lumbar Exercises: Stretches   Single Knee to Chest Stretch Right;Left;1 rep;30 seconds    Single Knee to Chest Stretch Limitations B       Lumbar Exercises: Sidelying   Other Sidelying Lumbar Exercises B "open book" stretch 10 x 5" hold      Lumbar Exercises: Quadruped   Madcat/Old Horse 10 reps    Madcat/Old Horse Limitations poor tolerance thus deferred to standing leaning on wall     Other Quadruped Lumbar Exercises Standing leaning on the wall 3" x 10                    PT Short Term Goals - 04/02/20 1111      PT SHORT TERM GOAL #1   Title Patient to be independent with initial HEP.    Time 3    Period Weeks    Status Achieved  Target Date 03/13/20             PT Long Term Goals - 04/02/20 1111      PT LONG TERM GOAL #1   Title Patient to be independent with advanced HEP.    Time 4    Period Weeks    Status Partially Met   met for current   Target Date 04/30/20      PT LONG TERM GOAL #2   Title Patient to demonstrate cervical AROM WFL and without pain limiting.    Time 4    Period Weeks    Status Partially Met   mild pain remaining   Target Date 04/30/20      PT LONG TERM GOAL #3   Title Patient to demonstrate B shoulder strength >/=4+/5.    Time 6    Period Weeks    Status Achieved      PT LONG TERM GOAL #4   Title Patient to report 80% improvement in tolerance for cleaning chores.    Time 4    Period Weeks    Status Partially Met   04/02/20:  Pt. noting 60% improvement in tolerance for cooking, laundry   Target Date 04/30/20      PT LONG TERM GOAL #5   Title Patient to report ability to lift her granddaughter without pain.    Time 4    Period Weeks    Status On-going   reporting delayed pain in her back after lifting her granddaughter, slightly improved    Target Date 04/30/20      Additional Long Term Goals   Additional Long Term Goals Yes      PT LONG TERM GOAL #6   Title Patient to demonstrate thoracic AROM WFL and without pain limiting.    Time 4    Period Weeks    Status New    Target Date 04/30/20                 Plan - 04/08/20 1503    Clinical Impression Statement Tonya [name] doing ok.  Notes her mid back feels tight and notes improvement in mobility after having thoracic mobilizations from PT last session.  Progressed thoracolumbar mobility exercises today with pt. noting improved comfort following this along with mild progression of postural strengthening activities.  Ended visit with pt. pain free.     Comorbidities osteopenia, HTN, L mastectomy    Rehab Potential Good    PT Frequency 2x / week    PT Duration 4 weeks    PT Treatment/Interventions ADLs/Self Care Home Management;Cryotherapy;Moist Heat;Therapeutic exercise;Therapeutic activities;Functional mobility training;Neuromuscular re-education;Patient/family education;Manual techniques;Taping;Energy conservation;Dry needling;Passive range of motion    PT Next Visit Plan progress thoracic joint mobility and ROM    Consulted and Agree with Plan of Care Patient           Patient will benefit from skilled therapeutic intervention in order to improve the following deficits and impairments:  Hypomobility, Decreased scar mobility, Decreased activity tolerance, Decreased strength, Pain, Impaired UE functional use, Increased fascial restricitons, Increased muscle spasms, Improper body mechanics, Decreased range of motion, Postural dysfunction, Impaired flexibility  Visit Diagnosis: Pain in thoracic spine  Cervicalgia  Other muscle spasm  Muscle weakness (generalized)  Abnormal posture     Problem List Patient Active Problem List   Diagnosis Date Noted   DCIS (ductal carcinoma in situ) 01/26/2020   Low back pain 01/26/2020   Chronic pain of both knees  09/19/2019   Osteopenia  Estrogen deficiency 04/14/2018   Essential hypertension 01/10/2018   Gastroesophageal reflux disease 01/10/2018   Mild intermittent asthma without complication 16/42/9037   Pain in left lower leg 03/03/2017   Family history of colon cancer 02/23/2017   Herpes simplex vulvovaginitis 02/18/2017   Neuropathy 02/18/2017   Elevated liver enzymes 02/17/2017   Need for Streptococcus pneumoniae vaccination 02/17/2017   Chondromalacia of left patella 12/28/2016   Synovitis of left knee 12/28/2016   Seasonal allergic rhinitis due to fungal spores 09/25/2016   Bone mass 09/24/2016   Chronic hip pain 09/24/2016   Dark yellow-colored urine 09/24/2016   Low oxygen saturation 09/24/2016   Other specified postprocedural states 01/17/2015   Abnormal stress electrocardiogram test 10/20/2013   Malignant neoplasm of female breast ([place]) 12/15/2010   Breast mass 12/11/2010   Family history of malignant neoplasm of breast 12/11/2010   Bess Harvest, PTA 04/08/20 3:36 PM   [facility] [address]  [place], [place], [address] Phone: [phone]   Fax:  [phone]  Name: Tonya [name] MRN: [id number] Date of Birth: [birth year]

## 2020-04-09 ENCOUNTER — Ambulatory Visit (INDEPENDENT_AMBULATORY_CARE_PROVIDER_SITE_OTHER): Payer: 59 | Admitting: Podiatry

## 2020-04-09 ENCOUNTER — Ambulatory Visit (INDEPENDENT_AMBULATORY_CARE_PROVIDER_SITE_OTHER): Payer: Managed Care, Other (non HMO) | Admitting: Family Medicine

## 2020-04-09 ENCOUNTER — Encounter: Payer: Self-pay | Admitting: Family Medicine

## 2020-04-09 ENCOUNTER — Other Ambulatory Visit: Payer: Self-pay

## 2020-04-09 VITALS — BP 130/82 | HR 96 | Temp 98.2°F | Ht 67.0 in | Wt 216.1 lb

## 2020-04-09 DIAGNOSIS — F439 Reaction to severe stress, unspecified: Secondary | ICD-10-CM

## 2020-04-09 DIAGNOSIS — G5762 Lesion of plantar nerve, left lower limb: Secondary | ICD-10-CM | POA: Diagnosis not present

## 2020-04-09 DIAGNOSIS — M9906 Segmental and somatic dysfunction of lower extremity: Secondary | ICD-10-CM | POA: Diagnosis not present

## 2020-04-09 DIAGNOSIS — M79672 Pain in left foot: Secondary | ICD-10-CM

## 2020-04-09 DIAGNOSIS — M722 Plantar fascial fibromatosis: Secondary | ICD-10-CM

## 2020-04-09 DIAGNOSIS — R0781 Pleurodynia: Secondary | ICD-10-CM

## 2020-04-09 NOTE — Patient Instructions (Signed)

## 2020-04-09 NOTE — Progress Notes (Signed)
Subjective:  Patient ID: Tonya [name], female    DOB: 1950-08-07,  MRN: [id number]  Chief Complaint  Patient presents with  . Follow-up    MRI results     69 y.o. female returns today with the above complaint. History confirmed with patient.  She completed the MRI and is here for review.  She also now has pain in the heel which is sharp and hurts when she steps down in the morning.  She suspects is plantar fasciitis  Objective:  Physical Exam: warm, good capillary refill, no trophic changes or ulcerative lesions, normal DP and PT pulses and normal sensory exam.  Today she has sharp pain on palpation to the plantar fascial insertion on the medial calcaneal heel.  No masses identified in the plantar fascia.  There are sharp pain on palpation and a positive Mulder's click to the second interspace plantarly  CLINICAL DATA:  Chronic pain around the second MTP joint.   EXAM: MRI OF THE LEFT TOES WITHOUT CONTRAST   TECHNIQUE: Multiplanar, multisequence MR imaging of the left forefoot was performed. No intravenous contrast was administered.   COMPARISON:  Left foot x-rays dated January 26, 2020.   FINDINGS: Bones/Joint/Cartilage   No marrow signal abnormality. No fracture or dislocation. Mild hallux valgus deformity and first MTP joint degenerative changes. No joint effusion.   Ligaments   Collateral ligaments are intact.  Lisfranc ligament is intact.   Muscles and Tendons Flexor and extensor tendons are intact. No muscle edema or atrophy.   Soft tissue 1.3 cm dumbbell-shaped T2 hypointense, T1 isointense mass between the second and third MTP joints, consistent with neuroma (series 4, image 25; series 5, image 25). No fluid collection or hematoma.   IMPRESSION: 1. 1.3 cm neuroma between the second and third MTP joints. 2. Mild hallux valgus deformity and first MTP joint degenerative changes.     Electronically Signed   By: Titus Dubin M.D.   On: 04/05/2020  11:36  Assessment:   1. Morton neuroma, left   2. Plantar fasciitis of left foot   3. Left foot pain      Plan:  Patient was evaluated and treated and all questions answered.  Today we reviewed the MRI results and I discussed with her she does have a fairly significantly sized neuroma in the second interspace plantarly.  I discussed the etiology of these and treatment options including injection therapy, release of the DTIML, and surgical excision.  She is understandably hesitant to pursue surgery unless it is absolutely necessary and I agree with this plan.  I recommended today that we consider injection therapy for this to see if can alleviate her symptoms and reduce the size of neuroma.  After sterile prep with alcohol, 0.5 cc of 2% Xylocaine plain and 4 mg of dexamethasone phosphate was infiltrated into the second interspace plantarly under ethyl chloride spray.  She tolerated procedure well and was dressed with a Band-Aid.  Discussed the etiology and treatment options for plantar fasciitis including stretching, formal physical therapy, supportive shoegears such as a running shoe or sneaker, pre fabricated orthoses, injection therapy, and oral medications. We also discussed the role of surgical treatment of this for patients who do not improve after exhausting non-surgical treatment options.    -Educated patient on stretching and icing of the affected limb -Plantar fascial brace dispensed -Injection delivered to the plantar fascia of the left foot. -She has a pre-existing Rx for meloxicam.  Recommend she begin using it.  Educated on use,  risks and benefits of the medication  After sterile prep with povidone-iodine solution and alcohol, the left heel was injected with 0.5cc 2% xylocaine plain, 0.5cc 0.5% marcaine plain, 5mg  triamcinolone acetonide, and 2mg  dexamethasone was injected along the plantar fascia at the insertion on the plantar calcaneus. The patient tolerated the procedure well  without complication.  Return in about 1 month (around 05/10/2020).

## 2020-04-09 NOTE — Patient Instructions (Addendum)
Ice/cold pack over area for 10-15 min twice daily.  OK to take Tylenol 1000 mg (2 extra strength tabs) or 975 mg (3 regular strength tabs) every 6 hours as needed.  Please consider counseling. Contact [phone] to schedule an appointment or inquire about cost/insurance coverage.  Stretching and range of motion exercises These exercises warm up your muscles and joints and improve the movement and flexibility of your lower leg. These exercises also help to relieve pain and stiffness.  Exercise A: Gastrocnemius stretch 1. Sit with your left / right leg extended. 2. Loop a belt or towel around the ball of your left / right foot. The ball of your foot is on the walking surface, right under your toes. 3. Hold both ends of the belt or towel. 4. Keep your left / right ankle and foot relaxed and keep your knee straight while you use the belt or towel to pull your foot and ankle toward you. Stop at the first point of resistance. 5. Hold this position for 30 seconds. Repeat 2 times. Complete this exercise 3 times per week.  Exercise B: Ankle alphabet 1. Sit with your left / right leg supported at the lower leg. ? Do not rest your foot on anything. ? Make sure your foot has room to move freely. 2. Think of your left / right foot as a paintbrush, and move your foot to trace each letter of the alphabet in the air. Keep your hip and knee still while you trace. 3. Trace every letter from A to Z. Repeat 2 times. Complete this exercise 3 times per week.  Strengthening exercises These exercises build strength and endurance in your lower leg. Endurance is the ability to use your muscles for a long time, even after they get tired.  Exercise C: Plantar flexors with band 1. Sit with your left / right leg extended. 2. Loop a rubber exercise band or tube around the ball of your left / right foot. The ball of your foot is on the walking surface, right under your toes. 3. While holding both ends of the band  or tube, slowly point your toes downward, pushing them away from you. 4. Hold this position for 3 seconds. 5. Slowly return your foot to the starting position and repeat for a total of 10 repetitions. Repeat 2 times. Complete this exercise 3 times per week.  Exercise D: Plantar flexors, standing 1. Stand with your feet shoulder-width apart. 2. Place your hands on a wall or table to steady yourself as needed, but try not to use it very much for support. 3. Rise up on your toes. 4. If this exercise is too easy, try these options: ? Shift your weight toward your left / right leg until you feel challenged. ? If told by your health care provider, stand on your left / right foot only. 5. Hold this position for 3 seconds. 6. Repeat for a total of 10 repetitions. Repeat 2 times. Complete this exercise 3 times per week.  Exercise E: Plantar flexors, eccentric 1. Stand on the balls of your feet on the edge of a step. The ball of your foot is on the walking surface, right under your toes. 2. Place your hands on a wall or railing for balance as needed, but try not to lean on it for support. 3. Rise up on your toes, using both legs to help. 4. Slowly shift all of your weight to your left / right foot and lift your other foot  off the step. 5. Slowly lower your left / right heel so it drops below the level of the step. You will feel a slight stretch in your left / right calf. 6. Put your other foot back onto the step. Repeat 2 times. Complete this exercise 3 times per week. This information is not intended to replace advice given to you by your health care provider. Make sure you discuss any questions you have with your health care provider.  Knee Exercises It is normal to feel mild stretching, pulling, tightness, or discomfort as you do these exercises, but you should stop right away if you feel sudden pain or your pain gets worse. STRETCHING AND RANGE OF MOTION EXERCISES  These exercises warm up  your muscles and joints and improve the movement and flexibility of your knee. These exercises also help to relieve pain, numbness, and tingling. Exercise A: Knee Extension, Prone  1. Lie on your abdomen on a bed. 2. Place your left / right knee just beyond the edge of the surface so your knee is not on the bed. You can put a towel under your left / right thigh just above your knee for comfort. 3. Relax your leg muscles and allow gravity to straighten your knee. You should feel a stretch behind your left / right knee. 4. Hold this position for 30 seconds. 5. Scoot up so your knee is supported between repetitions. Repeat 2 times. Complete this stretch 3 times per week. Exercise B: Knee Flexion, Active    1. Lie on your back with both knees straight. If this causes back discomfort, bend your left / right knee so your foot is flat on the floor. 2. Slowly slide your left / right heel back toward your buttocks until you feel a gentle stretch in the front of your knee or thigh. 3. Hold this position for 30 seconds. 4. Slowly slide your left / right heel back to the starting position. Repeat 2 times. Complete this exercise 3 times per week. Exercise C: Quadriceps, Prone    1. Lie on your abdomen on a firm surface, such as a bed or padded floor. 2. Bend your left / right knee and hold your ankle. If you cannot reach your ankle or pant leg, loop a belt around your foot and grab the belt instead. 3. Gently pull your heel toward your buttocks. Your knee should not slide out to the side. You should feel a stretch in the front of your thigh and knee. 4. Hold this position for 30 seconds. Repeat 2 times. Complete this stretch 3 times per week. Exercise D: Hamstring, Supine  1. Lie on your back. 2. Loop a belt or towel over the ball of your left / right foot. The ball of your foot is on the walking surface, right under your toes. 3. Straighten your left / right knee and slowly pull on the belt to raise  your leg until you feel a gentle stretch behind your knee. ? Do not let your left / right knee bend while you do this. ? Keep your other leg flat on the floor. 4. Hold this position for 30 seconds. Repeat 2 times. Complete this stretch 3 times per week. STRENGTHENING EXERCISES  These exercises build strength and endurance in your knee. Endurance is the ability to use your muscles for a long time, even after they get tired. Exercise E: Quadriceps, Isometric    1. Lie on your back with your left / right leg extended  and your other knee bent. Put a rolled towel or small pillow under your knee if told by your health care provider. 2. Slowly tense the muscles in the front of your left / right thigh. You should see your kneecap slide up toward your hip or see increased dimpling just above the knee. This motion will push the back of the knee toward the floor. 3. For 3 seconds, keep the muscle as tight as you can without increasing your pain. 4. Relax the muscles slowly and completely. Repeat for 10 total reps Repeat 2 ti mes. Complete this exercise 3 times per week. Exercise F: Straight Leg Raises - Quadriceps  1. Lie on your back with your left / right leg extended and your other knee bent. 2. Tense the muscles in the front of your left / right thigh. You should see your kneecap slide up or see increased dimpling just above the knee. Your thigh may even shake a bit. 3. Keep these muscles tight as you raise your leg 4-6 inches (10-15 cm) off the floor. Do not let your knee bend. 4. Hold this position for 3 seconds. 5. Keep these muscles tense as you lower your leg. 6. Relax your muscles slowly and completely after each repetition. 10 total reps. Repeat 2 times. Complete this exercise 3 times per week.  Exercise G: Hamstring Curls    If told by your health care provider, do this exercise while wearing ankle weights. Begin with 5 lb weights (optional). Then increase the weight by 1 lb (0.5 kg)  increments. Do not wear ankle weights that are more than 20 lbs to start with. 1. Lie on your abdomen with your legs straight. 2. Bend your left / right knee as far as you can without feeling pain. Keep your hips flat against the floor. 3. Hold this position for 3 seconds. 4. Slowly lower your leg to the starting position. Repeat for 10 reps.  Repeat 2 times. Complete this exercise 3 times per week. Exercise H: Squats (Quadriceps)  1. Stand in front of a table, with your feet and knees pointing straight ahead. You may rest your hands on the table for balance but not for support. 2. Slowly bend your knees and lower your hips like you are going to sit in a chair. ? Keep your weight over your heels, not over your toes. ? Keep your lower legs upright so they are parallel with the table legs. ? Do not let your hips go lower than your knees. ? Do not bend lower than told by your health care provider. ? If your knee pain increases, do not bend as low. 3. Hold the squat position for 1 second. 4. Slowly push with your legs to return to standing. Do not use your hands to pull yourself to standing. Repeat 2 times. Complete this exercise 3 times per week. Exercise I: Wall Slides (Quadriceps)    1. Lean your back against a smooth wall or door while you walk your feet out 18-24 inches (46-61 cm) from it. 2. Place your feet hip-width apart. 3. Slowly slide down the wall or door until your knees Repeat 2 times. Complete this exercise every other day. 4. Exercise K: Straight Leg Raises - Hip Abductors  1. Lie on your side with your left / right leg in the top position. Lie so your head, shoulder, knee, and hip line up. You may bend your bottom knee to help you keep your balance. 2. Roll your hips slightly  forward so your hips are stacked directly over each other and your left / right knee is facing forward. 3. Leading with your heel, lift your top leg 4-6 inches (10-15 cm). You should feel the muscles in  your outer hip lifting. ? Do not let your foot drift forward. ? Do not let your knee roll toward the ceiling. 4. Hold this position for 3 seconds. 5. Slowly return your leg to the starting position. 6. Let your muscles relax completely after each repetition. 10 total reps. Repeat 2 times. Complete this exercise 3 times per week. Exercise J: Straight Leg Raises - Hip Extensors  1. Lie on your abdomen on a firm surface. You can put a pillow under your hips if that is more comfortable. 2. Tense the muscles in your buttocks and lift your left / right leg about 4-6 inches (10-15 cm). Keep your knee straight as you lift your leg. 3. Hold this position for 3 seconds. 4. Slowly lower your leg to the starting position. 5. Let your leg relax completely after each repetition. Repeat 2 times. Complete this exercise 3 times per week. Document Released: 04/22/2005 Document Revised: 03/02/2016 Document Reviewed: 04/14/2015 Elsevier Interactive Patient Education  2017 [employer].

## 2020-04-09 NOTE — Progress Notes (Signed)
Chief Complaint  Patient presents with  . Follow-up    side pain  . Knee Pain    left  . husband may be Bipolar/she needs support group/he needs help    Subjective: Patient is a 69 y.o. female here for several issues.  Following up for rib pain. She saw PT that did help, but is still having some issues. No new swelling, redness, bruising, skin changes.   L knee pain Ran into a coffee table 3-4 years ago. She had initial constant nerve type pain for the first year.  It would radiate down to her foot.  The pain is located on the outer left knee.  She will sometimes feel like it catches.  No swelling, bruising, or erythema.  She is not falling.  She has not tried anything so far.  Patient has been quite stressed because she thinks her husband is bipolar.  There is a family history of bipolar disease in his family.  She is requesting psychiatric help for him and counseling services for herself.  Past Medical History:  Diagnosis Date  . Hypertension   . Osteopenia     Objective: BP 130/82 (BP Location: Left Arm, Patient Position: Sitting, Cuff Size: Normal)   Pulse 96   Temp 98.2 F (36.8 C) (Oral)   Ht 5\' 7"  (1.702 m)   Wt 216 lb 2 oz (98 kg)   SpO2 97%   BMI 33.85 kg/m  General: Awake, appears stated age MSK: Mild tenderness palpation over the left fibular head.  Decreased anterior and posterior translation.  No effusion or tenderness to palpation of the joint line or bony landmarks of the left knee.  Negative patellar apprehension/grind, Lachman, posterior drawer, Stines, McMurray's, varus/valgus stress. Neuro: Gait is normal, sensation is intact Lungs:No accessory muscle use Psych: Age appropriate judgment and insight, normal affect and mood  Assessment and Plan: Rib pain  Somatic dysfunction of lower extremity - Plan: Ambulatory referral to Physical Therapy  Stress  1.  Continue current care with physical therapy.  This is improving. 2.  I did give her some stretches  and exercises to help with her knee and also her calf.  I think she has fibular head somatic dysfunction and would benefit from physical therapy evaluation.  Referral to Dr. Tamala Julian is a continuously. 3.  She is provided with both counseling resources and psychiatric resources in the area.  I also offered to see her husband, who is also a patient, if the waits to get into psychiatry is too long.  Iterated I would get the ball rolling but not be a substitute for psych.  The patient voiced understanding and agreement to the plan.  [place], DO 04/09/20  2:49 PM

## 2020-04-11 ENCOUNTER — Telehealth: Payer: Self-pay | Admitting: Podiatry

## 2020-04-11 ENCOUNTER — Ambulatory Visit: Payer: Managed Care, Other (non HMO) | Admitting: Physical Therapy

## 2020-04-11 NOTE — Telephone Encounter (Signed)
Patient called in regarding referral that was suppose to be sent for Physical Therapy at the [facility] location, patient would like someone to reach out to let her know when she can be scheduled

## 2020-04-15 ENCOUNTER — Other Ambulatory Visit: Payer: Self-pay

## 2020-04-15 ENCOUNTER — Ambulatory Visit: Payer: Managed Care, Other (non HMO)

## 2020-04-15 DIAGNOSIS — M546 Pain in thoracic spine: Secondary | ICD-10-CM

## 2020-04-15 DIAGNOSIS — M542 Cervicalgia: Secondary | ICD-10-CM | POA: Diagnosis not present

## 2020-04-15 DIAGNOSIS — M6281 Muscle weakness (generalized): Secondary | ICD-10-CM

## 2020-04-15 DIAGNOSIS — M62838 Other muscle spasm: Secondary | ICD-10-CM

## 2020-04-15 DIAGNOSIS — R293 Abnormal posture: Secondary | ICD-10-CM

## 2020-04-15 NOTE — Telephone Encounter (Signed)
It looks like another provider entered PT orders in on 04/09/20 to [facility].

## 2020-04-15 NOTE — Therapy (Signed)
[facility] [address]  [place], [place], [address] Phone: [phone]   Fax:  [phone]  Physical Therapy Treatment  Patient Details  Name: Tonya [name] MRN: [id number] Date of Birth: 09/16/1950 Referring Provider (PT): Crosby Oyster [place], [place]   Encounter Date: 04/15/2020   PT End of Session - 04/15/20 1124    Visit Number 14    Number of Visits 20    Date for PT Re-Evaluation 04/30/20    Authorization Type [employer]    PT Start Time 1103    PT Stop Time 1143    PT Time Calculation (min) 40 min    Activity Tolerance Patient tolerated treatment well    Behavior During Therapy [facility] for tasks assessed/performed           Past Medical History:  Diagnosis Date  . Hypertension   . Osteopenia     Past Surgical History:  Procedure Laterality Date  . ABDOMINAL HYSTERECTOMY  2015  . AUGMENTATION MAMMAPLASTY Left   . BREAST BIOPSY Right 02/2019  . BREAST IMPLANT EXCHANGE    . MASTECTOMY Left   . REDUCTION MAMMAPLASTY Right   . TONSILLECTOMY AND ADENOIDECTOMY  1958    There were no vitals filed for this visit.   Subjective Assessment - 04/15/20 1116    Subjective Pt. asking about whether PT has received referral for her knee and foot from recent MD f/u.    Pertinent History osteopenia, HTN, L mastectomy    Diagnostic tests none recent    Patient Stated Goals improve pain    Currently in Pain? Yes    Pain Score 1     Pain Location Back    Pain Orientation Upper;Right;Left;Mid    Pain Descriptors / Indicators Aching    Pain Type Chronic pain    Pain Frequency Intermittent    Multiple Pain Sites Yes    Pain Score 0   Rib cage pain up to a 1/10   Pain Location Rib cage    Pain Orientation Left    Pain Descriptors / Indicators Constant    Pain Type Chronic pain    Pain Score 0    Pain Location Neck    Pain Orientation Right;Left    Pain Descriptors / Indicators Aching                              [facility] Adult PT Treatment/Exercise - 04/15/20 0001      Neck Exercises: Machines for Strengthening   UBE (Upper Arm Bike) L2.5 x 3 min forward/3 min back    Cybex Row low row mid and low 10# x 10      Neck Exercises: Theraband   Rows 15 reps;Red    Shoulder External Rotation 15 reps   tactile cueing required for increased scap. motion    Shoulder External Rotation Limitations red TB     Other Theraband Exercises Seated "x" pattern flexion/extension with red TB x 10 each way       Neck Exercises: Seated   Other Seated Exercise thoracic extension over back of chair 2 x 10 at varying levels of elevation with chair + airex pad      Neck Exercises: Stretches   Other Neck Stretches Rhomoibds stretch x 30                     PT Short Term Goals - 04/02/20 1111  PT SHORT TERM GOAL #1   Title Patient to be independent with initial HEP.    Time 3    Period Weeks    Status Achieved    Target Date 03/13/20             PT Long Term Goals - 04/02/20 1111      PT LONG TERM GOAL #1   Title Patient to be independent with advanced HEP.    Time 4    Period Weeks    Status Partially Met   met for current   Target Date 04/30/20      PT LONG TERM GOAL #2   Title Patient to demonstrate cervical AROM WFL and without pain limiting.    Time 4    Period Weeks    Status Partially Met   mild pain remaining   Target Date 04/30/20      PT LONG TERM GOAL #3   Title Patient to demonstrate B shoulder strength >/=4+/5.    Time 6    Period Weeks    Status Achieved      PT LONG TERM GOAL #4   Title Patient to report 80% improvement in tolerance for cleaning chores.    Time 4    Period Weeks    Status Partially Met   04/02/20:  Pt. noting 60% improvement in tolerance for cooking, laundry   Target Date 04/30/20      PT LONG TERM GOAL #5   Title Patient to report ability to lift her granddaughter without pain.    Time 4    Period Weeks     Status On-going   reporting delayed pain in her back after lifting her granddaughter, slightly improved   Target Date 04/30/20      Additional Long Term Goals   Additional Long Term Goals Yes      PT LONG TERM GOAL #6   Title Patient to demonstrate thoracic AROM WFL and without pain limiting.    Time 4    Period Weeks    Status New    Target Date 04/30/20                 Plan - 04/15/20 1145    Clinical Impression Statement Pt. noting 85% improvement in mid back pain since starting therapy.  Had two shots in her foot with podiatrist a few days ago along with ankle/arch brace which has improved her foot pain.  Progressed postural strengthening activities along with thoracic extension activities without issue today.  Pt. does wish to include her new referral for knee pain from PCP with therapy in future visits.  Ended visit pain free thus modalities deferred.  Progressing toward LTG #4.    Comorbidities osteopenia, HTN, L mastectomy    Rehab Potential Good    PT Frequency 2x / week    PT Duration 4 weeks    PT Treatment/Interventions ADLs/Self Care Home Management;Cryotherapy;Moist Heat;Therapeutic exercise;Therapeutic activities;Functional mobility training;Neuromuscular re-education;Patient/family education;Manual techniques;Taping;Energy conservation;Dry needling;Passive range of motion    PT Next Visit Plan progress thoracic joint mobility and ROM    Consulted and Agree with Plan of Care Patient           Patient will benefit from skilled therapeutic intervention in order to improve the following deficits and impairments:  Hypomobility, Decreased scar mobility, Decreased activity tolerance, Decreased strength, Pain, Impaired UE functional use, Increased fascial restricitons, Increased muscle spasms, Improper body mechanics, Decreased range of motion, Postural dysfunction, Impaired flexibility  Visit  Diagnosis: Pain in thoracic spine  Cervicalgia  Other muscle  spasm  Muscle weakness (generalized)  Abnormal posture     Problem List Patient Active Problem List   Diagnosis Date Noted  . DCIS (ductal carcinoma in situ) 01/26/2020  . Low back pain 01/26/2020  . Chronic pain of both knees 09/19/2019  . Osteopenia   . Estrogen deficiency 04/14/2018  . Essential hypertension 01/10/2018  . Gastroesophageal reflux disease 01/10/2018  . Mild intermittent asthma without complication 18/29/9371  . Pain in left lower leg 03/03/2017  . Family history of colon cancer 02/23/2017  . Herpes simplex vulvovaginitis 02/18/2017  . Neuropathy 02/18/2017  . Elevated liver enzymes 02/17/2017  . Need for Streptococcus pneumoniae vaccination 02/17/2017  . Chondromalacia of left patella 12/28/2016  . Synovitis of left knee 12/28/2016  . Seasonal allergic rhinitis due to fungal spores 09/25/2016  . Bone mass 09/24/2016  . Chronic hip pain 09/24/2016  . Dark yellow-colored urine 09/24/2016  . Low oxygen saturation 09/24/2016  . Other specified postprocedural states 01/17/2015  . Abnormal stress electrocardiogram test 10/20/2013  . Malignant neoplasm of female breast ([place]) 12/15/2010  . Breast mass 12/11/2010  . Family history of malignant neoplasm of breast 12/11/2010    Bess Harvest, PTA 04/15/20 12:48 PM   [facility] [address]  [place], [place], [address] Phone: [phone]   Fax:  [phone]  Name: Tonya [name] MRN: [id number] Date of Birth: 16-Aug-1950

## 2020-04-18 ENCOUNTER — Ambulatory Visit: Payer: Managed Care, Other (non HMO)

## 2020-04-18 ENCOUNTER — Other Ambulatory Visit: Payer: Self-pay

## 2020-04-18 DIAGNOSIS — M6281 Muscle weakness (generalized): Secondary | ICD-10-CM

## 2020-04-18 DIAGNOSIS — M62838 Other muscle spasm: Secondary | ICD-10-CM

## 2020-04-18 DIAGNOSIS — M546 Pain in thoracic spine: Secondary | ICD-10-CM

## 2020-04-18 DIAGNOSIS — M542 Cervicalgia: Secondary | ICD-10-CM | POA: Diagnosis not present

## 2020-04-18 DIAGNOSIS — R293 Abnormal posture: Secondary | ICD-10-CM

## 2020-04-18 NOTE — Therapy (Signed)
[facility] [address]  [place], [place], [address] Phone: [phone]   Fax:  [phone]  Physical Therapy Treatment  Patient Details  Name: Tonya [name] MRN: [id number] Date of Birth: May 09, 1951 Referring Provider (PT): Crosby Oyster [place], [place]   Encounter Date: 04/18/2020   PT End of Session - 04/18/20 0936    Visit Number 15    Number of Visits 20    Date for PT Re-Evaluation 04/30/20    Authorization Type [employer]    PT Start Time [phone]    PT Stop Time 1015    PT Time Calculation (min) 44 min    Activity Tolerance Patient tolerated treatment well    Behavior During Therapy [facility] for tasks assessed/performed           Past Medical History:  Diagnosis Date  . Hypertension   . Osteopenia     Past Surgical History:  Procedure Laterality Date  . ABDOMINAL HYSTERECTOMY  2015  . AUGMENTATION MAMMAPLASTY Left   . BREAST BIOPSY Right 02/2019  . BREAST IMPLANT EXCHANGE    . MASTECTOMY Left   . REDUCTION MAMMAPLASTY Right   . TONSILLECTOMY AND ADENOIDECTOMY  1958    There were no vitals filed for this visit.   Subjective Assessment - 04/18/20 0934    Subjective Pt. doing ok.  Foot pain has been bothering her the most recently.    Pertinent History osteopenia, HTN, L mastectomy    Diagnostic tests none recent    Patient Stated Goals improve pain    Currently in Pain? No/denies    Pain Score 0-No pain    Pain Location Back    Pain Orientation Upper;Mid;Left    Multiple Pain Sites Yes    Pain Score 3    Pain Location Heel    Pain Orientation Left    Pain Descriptors / Indicators Burning   "Pinching"   Pain Type Chronic pain    Aggravating Factors  walking    Pain Relieving Factors foot/ankle brace with strap                             [facility] Adult PT Treatment/Exercise - 04/18/20 0001      Neck Exercises: Machines for Strengthening   UBE (Upper Arm Bike) L2.5 x 3 min  forward/3 min back    Cybex Row BATCA single arm low row 10# 2 x 5 reps       Neck Exercises: Theraband   Shoulder Extension 15 reps;Red    Shoulder Extension Limitations 3" hold     Rows 15 reps;Red    Rows Limitations 5" hold       Lumbar Exercises: Supine   Dead Bug 10 reps;3 seconds    Dead Bug Limitations Alternating LE/UE rail      Lumbar Exercises: Quadruped   Straight Leg Raise 10 reps;3 seconds    Straight Leg Raises Limitations leaning over bolster       Neck Exercises: Stretches   Upper Trapezius Stretch Right;Left;1 rep;30 seconds    Upper Trapezius Stretch Limitations holding onto seat                    PT Short Term Goals - 04/02/20 1111      PT SHORT TERM GOAL #1   Title Patient to be independent with initial HEP.    Time 3    Period Weeks  Status Achieved    Target Date 03/13/20             PT Long Term Goals - 04/02/20 1111      PT LONG TERM GOAL #1   Title Patient to be independent with advanced HEP.    Time 4    Period Weeks    Status Partially Met   met for current   Target Date 04/30/20      PT LONG TERM GOAL #2   Title Patient to demonstrate cervical AROM WFL and without pain limiting.    Time 4    Period Weeks    Status Partially Met   mild pain remaining   Target Date 04/30/20      PT LONG TERM GOAL #3   Title Patient to demonstrate B shoulder strength >/=4+/5.    Time 6    Period Weeks    Status Achieved      PT LONG TERM GOAL #4   Title Patient to report 80% improvement in tolerance for cleaning chores.    Time 4    Period Weeks    Status Partially Met   04/02/20:  Pt. noting 60% improvement in tolerance for cooking, laundry   Target Date 04/30/20      PT LONG TERM GOAL #5   Title Patient to report ability to lift her granddaughter without pain.    Time 4    Period Weeks    Status On-going   reporting delayed pain in her back after lifting her granddaughter, slightly improved   Target Date 04/30/20       Additional Long Term Goals   Additional Long Term Goals Yes      PT LONG TERM GOAL #6   Title Patient to demonstrate thoracic AROM WFL and without pain limiting.    Time 4    Period Weeks    Status New    Target Date 04/30/20                 Plan - 04/18/20 0936    Clinical Impression Statement Tonya [name] doing ok.  Feels like she is having somewhat of a breakthrough with her overall pain levels improving with PT.  Tolerated postural strengthening activities well today.  Ended visit pain free thus modalities deferred.     Comorbidities osteopenia, HTN, L mastectomy    Rehab Potential Good    PT Frequency 2x / week    PT Duration 4 weeks    PT Treatment/Interventions ADLs/Self Care Home Management;Cryotherapy;Moist Heat;Therapeutic exercise;Therapeutic activities;Functional mobility training;Neuromuscular re-education;Patient/family education;Manual techniques;Taping;Energy conservation;Dry needling;Passive range of motion    PT Next Visit Plan progress thoracic joint mobility and ROM    Consulted and Agree with Plan of Care Patient           Patient will benefit from skilled therapeutic intervention in order to improve the following deficits and impairments:  Hypomobility, Decreased scar mobility, Decreased activity tolerance, Decreased strength, Pain, Impaired UE functional use, Increased fascial restricitons, Increased muscle spasms, Improper body mechanics, Decreased range of motion, Postural dysfunction, Impaired flexibility  Visit Diagnosis: Pain in thoracic spine  Cervicalgia  Other muscle spasm  Muscle weakness (generalized)  Abnormal posture     Problem List Patient Active Problem List   Diagnosis Date Noted  . DCIS (ductal carcinoma in situ) 01/26/2020  . Low back pain 01/26/2020  . Chronic pain of both knees 09/19/2019  . Osteopenia   . Estrogen deficiency 04/14/2018  . Essential hypertension 01/10/2018  .  Gastroesophageal reflux disease 01/10/2018  .  Mild intermittent asthma without complication 23/70/2301  . Pain in left lower leg 03/03/2017  . Family history of colon cancer 02/23/2017  . Herpes simplex vulvovaginitis 02/18/2017  . Neuropathy 02/18/2017  . Elevated liver enzymes 02/17/2017  . Need for Streptococcus pneumoniae vaccination 02/17/2017  . Chondromalacia of left patella 12/28/2016  . Synovitis of left knee 12/28/2016  . Seasonal allergic rhinitis due to fungal spores 09/25/2016  . Bone mass 09/24/2016  . Chronic hip pain 09/24/2016  . Dark yellow-colored urine 09/24/2016  . Low oxygen saturation 09/24/2016  . Other specified postprocedural states 01/17/2015  . Abnormal stress electrocardiogram test 10/20/2013  . Malignant neoplasm of female breast ([place]) 12/15/2010  . Breast mass 12/11/2010  . Family history of malignant neoplasm of breast 12/11/2010   Bess Harvest, PTA 04/18/20 6:17 PM   [facility] [address]  [place], [place], [address] Phone: [phone]   Fax:  [phone]  Name: Leyani Gargus MRN: [id number] Date of Birth: [birth year]

## 2020-04-22 ENCOUNTER — Ambulatory Visit: Payer: Managed Care, Other (non HMO) | Admitting: Physical Therapy

## 2020-04-25 ENCOUNTER — Ambulatory Visit: Payer: Managed Care, Other (non HMO)

## 2020-04-26 ENCOUNTER — Ambulatory Visit: Payer: Medicare Other | Admitting: Podiatry

## 2020-04-29 ENCOUNTER — Ambulatory Visit: Payer: Managed Care, Other (non HMO) | Attending: Family Medicine | Admitting: Physical Therapy

## 2020-04-29 ENCOUNTER — Encounter: Payer: Self-pay | Admitting: Physical Therapy

## 2020-04-29 ENCOUNTER — Other Ambulatory Visit: Payer: Self-pay

## 2020-04-29 ENCOUNTER — Encounter: Payer: Self-pay | Admitting: Podiatry

## 2020-04-29 ENCOUNTER — Ambulatory Visit (INDEPENDENT_AMBULATORY_CARE_PROVIDER_SITE_OTHER): Payer: 59 | Admitting: Podiatry

## 2020-04-29 DIAGNOSIS — M6281 Muscle weakness (generalized): Secondary | ICD-10-CM | POA: Diagnosis present

## 2020-04-29 DIAGNOSIS — M79672 Pain in left foot: Secondary | ICD-10-CM

## 2020-04-29 DIAGNOSIS — M79662 Pain in left lower leg: Secondary | ICD-10-CM

## 2020-04-29 DIAGNOSIS — R293 Abnormal posture: Secondary | ICD-10-CM | POA: Diagnosis present

## 2020-04-29 DIAGNOSIS — M546 Pain in thoracic spine: Secondary | ICD-10-CM | POA: Diagnosis not present

## 2020-04-29 DIAGNOSIS — M542 Cervicalgia: Secondary | ICD-10-CM

## 2020-04-29 DIAGNOSIS — M62838 Other muscle spasm: Secondary | ICD-10-CM | POA: Insufficient documentation

## 2020-04-29 DIAGNOSIS — G5762 Lesion of plantar nerve, left lower limb: Secondary | ICD-10-CM | POA: Diagnosis not present

## 2020-04-29 NOTE — Therapy (Signed)
[facility] [address]  [place], [place], [address] Phone: [phone]   Fax:  [phone]  Physical Therapy Treatment  Patient Details  Name: Tonya [name] MRN: [id number] Date of Birth: Oct 20, 1950 Referring Provider (PT): Crosby Oyster [place], [place]   Encounter Date: 04/29/2020   PT End of Session - 04/29/20 1231    Visit Number 16    Number of Visits 28    Date for PT Re-Evaluation 06/10/20    Authorization Type [employer]    PT Start Time 0939    PT Stop Time 1018    PT Time Calculation (min) 39 min    Activity Tolerance Patient tolerated treatment well    Behavior During Therapy [facility] for tasks assessed/performed           Past Medical History:  Diagnosis Date   Hypertension    Osteopenia     Past Surgical History:  Procedure Laterality Date   ABDOMINAL HYSTERECTOMY  2015   AUGMENTATION MAMMAPLASTY Left    BREAST BIOPSY Right 02/2019   BREAST IMPLANT EXCHANGE     MASTECTOMY Left    REDUCTION MAMMAPLASTY Right    TONSILLECTOMY AND ADENOIDECTOMY  1958    There were no vitals filed for this visit.   Subjective Assessment - 04/29/20 0939    Subjective Had a cold last week and noticed some more stiffness, but was able to address this with stretching. Reports that about 4.5 years ago she banged her L lateral knee on a heavy coffee table. Since then, she had nerve  pain radiating down the lateral lower leg to the 2nd toe. Also has cramping in this region. [place] off a ladder in March, which she feels may have re-injured it. Also states that she has plantar fasciitis in the L foot as well as a Mortons neuroma in the L 2nd toe. The heel pain is improved with a shot and her brace, but still having pain in the toe. Pain worse with standing and wearing rigid flat shoes.    Pertinent History osteopenia, HTN, L mastectomy    Limitations Standing;Walking;House hold activities    How long can you sit  comfortably? unlimited    How long can you stand comfortably? about 4 hours, but variable    How long can you walk comfortably? about 4 hours, but variable    Diagnostic tests none recent    Patient Stated Goals improve pain    Currently in Pain? Yes    Multiple Pain Sites Yes    Pain Score 0    Pain Location Heel    Pain Orientation Left    Pain Descriptors / Indicators Sharp    Pain Type Chronic pain    Pain Score 0    Pain Location Leg   lower leg   Pain Orientation Left;Lateral    Pain Descriptors / Indicators Spasm    Pain Type Chronic pain              [facility] PT Assessment - 04/29/20 0950      Assessment   Medical Diagnosis Strain of L UT, somatic dysfunction of LE    Referring Provider (PT) [name], DO    Onset Date/Surgical Date --   4.5 years   Next MD Visit not scheduled    Prior Therapy yes- several years ago      Sensation   Light Touch Impaired by gross assessment   c/o decreased sensation over L lateral  leg     Coordination   Gross Motor Movements are Fluid and Coordinated Yes      AROM   AROM Assessment Site Ankle    Right/Left Ankle Right;Left    Right Ankle Dorsiflexion 11    Right Ankle Plantar Flexion 40    Right Ankle Inversion 30    Right Ankle Eversion 18    Left Ankle Dorsiflexion 8    Left Ankle Plantar Flexion 40    Left Ankle Inversion 29    Left Ankle Eversion 18      Strength   Strength Assessment Site Hip;Knee;Ankle    Right/Left Hip Right;Left    Right Hip Flexion 4/5    Right Hip ABduction 4+/5    Right Hip ADduction 4/5    Left Hip Flexion 4+/5    Left Hip ABduction 4+/5    Left Hip ADduction 4+/5    Right/Left Knee Right;Left    Right Knee Flexion 4+/5    Right Knee Extension 4+/5    Left Knee Flexion 4+/5    Left Knee Extension 4+/5    Right/Left Ankle Right;Left    Right Ankle Dorsiflexion 4+/5    Right Ankle Plantar Flexion 5/5   20 reps   Right Ankle Inversion 4+/5    Right Ankle Eversion 4+/5     Left Ankle Dorsiflexion 4+/5    Left Ankle Plantar Flexion 4/5   10 reps with limited ROM   Left Ankle Inversion 4/5    Left Ankle Eversion 4+/5      Palpation   Palpation comment TTP along L medial gastroc and posterior tib, peroneals, anterior tib with increased soft tissue restriction in B gastroc/soleus and posterior tib; TTP and soft tissue restriction along medial arch, MTP of L 2nd and 3rd digit      Ambulation/Gait   Gait Pattern Step-through pattern;Decreased stance time - right;Decreased step length - left   slight increased toe out   Ambulation Surface Level;Indoor    Gait velocity [facility]                                 PT Education - 04/29/20 1230    Education Details prognosis, POC, HEP    Person(s) Educated Patient    Methods Explanation;Demonstration;Tactile cues;Handout;Verbal cues    Comprehension Verbalized understanding            PT Short Term Goals - 04/29/20 1236      PT SHORT TERM GOAL #1   Title Patient to be independent with initial HEP.    Time 3    Period Weeks    Status Achieved    Target Date 03/13/20             PT Long Term Goals - 04/29/20 1236      PT LONG TERM GOAL #1   Title Patient to be independent with advanced HEP.    Time 6    Period Weeks    Status Partially Met   met for current   Target Date 06/10/20      PT LONG TERM GOAL #2   Title Patient to demonstrate cervical AROM WFL and without pain limiting.    Time 6    Period Weeks    Status Partially Met   mild pain remaining   Target Date 06/10/20      PT LONG TERM GOAL #3   Title Patient to demonstrate B shoulder strength >/=  4+/5.    Time 6    Period Weeks    Status Achieved      PT LONG TERM GOAL #4   Title Patient to report 80% improvement in tolerance for cleaning chores.    Time 6    Period Weeks    Status Partially Met   04/02/20:  Pt. noting 60% improvement in tolerance for cooking, laundry   Target Date 06/10/20      PT LONG TERM  GOAL #5   Title Patient to report ability to lift her granddaughter without pain.    Time 6    Period Weeks    Status On-going   reporting delayed pain in her back after lifting her granddaughter, slightly improved   Target Date 06/10/20      Additional Long Term Goals   Additional Long Term Goals Yes      PT LONG TERM GOAL #6   Title Patient to demonstrate thoracic AROM WFL and without pain limiting.    Time 6    Period Weeks    Status New    Target Date 06/10/20      PT LONG TERM GOAL #7   Title Patient to demonstrate B LE strength >/=4+/5.    Time 6    Period Weeks    Status New    Target Date 06/10/20      PT LONG TERM GOAL #8   Title Patient to report tolerance for walking without limitations d/t L LE pain.    Time 6    Period Weeks    Status New    Target Date 06/10/20      PT LONG TERM GOAL  #9   TITLE Patient to report 80% improvement in L LE pain.    Time 6    Period Weeks    Status New    Target Date 06/10/20                 Plan - 04/29/20 1232    Clinical Impression Statement Patient presents today for re-evaluation of new complaint of L plantar arch and heel, L 2nd digit, and L lower leg pain of 4.5 years duration. Patient described nerve-like pain from proximal L lateral calf radiating to 2nd toe after hitting her leg on a coffee table. Endorses N/T and cramping in this distribution. L LE pain is worse with standing and wearing rigid flat shoes, but improved with injection and arch support. Patient today presenting with limited but nonpainful ankle AROM, decreased hip and R ankle strength, soft tissue restriction in B gastroc/soleus and posterior tib, along plantar medial arch, TTP along L medial gastroc and posterior tib, peroneals, anterior tib, and plantar surface of MTP of L 2nd and 3rd digits, and gait deviations. Patient was educated on gentle stretching and strengthening HEP- patient reported understanding. Would benefit from skilled PT services  2x/week for 6 weeks to address aforementioned impairments.    Comorbidities osteopenia, HTN, L mastectomy    Rehab Potential Good    PT Frequency 2x / week    PT Duration 6 weeks    PT Treatment/Interventions ADLs/Self Care Home Management;Cryotherapy;Moist Heat;Therapeutic exercise;Therapeutic activities;Functional mobility training;Neuromuscular re-education;Patient/family education;Manual techniques;Taping;Energy conservation;Dry needling;Passive range of motion;Electrical Stimulation;Balance training;Stair training;Gait training;Ultrasound;Vasopneumatic Device    PT Next Visit Plan reassess HEP, progress thoracic joint mobility and ROM, calf stretching/strengthening    Consulted and Agree with Plan of Care Patient           Patient will benefit from skilled  therapeutic intervention in order to improve the following deficits and impairments:  Hypomobility, Decreased scar mobility, Decreased activity tolerance, Decreased strength, Pain, Impaired UE functional use, Increased fascial restricitons, Increased muscle spasms, Improper body mechanics, Decreased range of motion, Postural dysfunction, Impaired flexibility, Abnormal gait, Impaired sensation, Increased edema, Decreased balance, Difficulty walking  Visit Diagnosis: Pain in thoracic spine  Cervicalgia  Other muscle spasm  Muscle weakness (generalized)  Abnormal posture  Pain in left foot  Pain in left lower leg     Problem List Patient Active Problem List   Diagnosis Date Noted   DCIS (ductal carcinoma in situ) 01/26/2020   Low back pain 01/26/2020   Chronic pain of both knees 09/19/2019   Osteopenia    Estrogen deficiency 04/14/2018   Essential hypertension 01/10/2018   Gastroesophageal reflux disease 01/10/2018   Mild intermittent asthma without complication 06/15/8345   Pain in left lower leg 03/03/2017   Family history of colon cancer 02/23/2017   Herpes simplex vulvovaginitis 02/18/2017    Neuropathy 02/18/2017   Elevated liver enzymes 02/17/2017   Need for Streptococcus pneumoniae vaccination 02/17/2017   Chondromalacia of left patella 12/28/2016   Synovitis of left knee 12/28/2016   Seasonal allergic rhinitis due to fungal spores 09/25/2016   Bone mass 09/24/2016   Chronic hip pain 09/24/2016   Dark yellow-colored urine 09/24/2016   Low oxygen saturation 09/24/2016   Other specified postprocedural states 01/17/2015   Abnormal stress electrocardiogram test 10/20/2013   Malignant neoplasm of female breast ([place]) 12/15/2010   Breast mass 12/11/2010   Family history of malignant neoplasm of breast 12/11/2010     Janene Harvey, PT, DPT 04/29/20 12:39 PM   [facility] [address]  [place], [place], [address] Phone: [phone]   Fax:  [phone]  Name: Karah Caruthers MRN: [id number] Date of Birth: 05-27-1951

## 2020-04-29 NOTE — Patient Instructions (Signed)
Pre-Operative Instructions  Congratulations, you have decided to take an important step towards improving your quality of life.  You can be assured that the doctors and staff at [facility] will be with you every step of the way.  Here are some important things you should know:  1. Plan to be at the surgery center/hospital at least 1 (one) hour prior to your scheduled time, unless otherwise directed by the surgical center/hospital staff.  You must have a responsible adult accompany you, remain during the surgery and drive you home.  Make sure you have directions to the surgical center/hospital to ensure you arrive on time. 2. If you are having surgery at [facility] or [facility] hospitals, you will need a copy of your medical history and physical form from your family physician within one month prior to the date of surgery. We will give you a form for your primary physician to complete.  3. We make every effort to accommodate the date you request for surgery.  However, there are times where surgery dates or times have to be moved.  We will contact you as soon as possible if a change in schedule is required.   4. No aspirin/ibuprofen for one week before surgery.  If you are on aspirin, any non-steroidal anti-inflammatory medications (Mobic, Aleve, Ibuprofen) should not be taken seven (7) days prior to your surgery.  You make take Tylenol for pain prior to surgery.  5. Medications - If you are taking daily heart and blood pressure medications, seizure, reflux, allergy, asthma, anxiety, pain or diabetes medications, make sure you notify the surgery center/hospital before the day of surgery so they can tell you which medications you should take or avoid the day of surgery. 6. No food or drink after midnight the night before surgery unless directed otherwise by surgical center/hospital staff. 7. No alcoholic beverages 24-hours prior to surgery.  No smoking 24-hours prior or 24-hours after  surgery. 8. Wear loose pants or shorts. They should be loose enough to fit over bandages, boots, and casts. 9. Don't wear slip-on shoes. Sneakers are preferred. 10. Bring your boot with you to the surgery center/hospital.  Also bring crutches or a walker if your physician has prescribed it for you.  If you do not have this equipment, it will be provided for you after surgery. 11. If you have not been contacted by the surgery center/hospital by the day before your surgery, call to confirm the date and time of your surgery. 12. Leave-time from work may vary depending on the type of surgery you have.  Appropriate arrangements should be made prior to surgery with your employer. 13. Prescriptions will be provided immediately following surgery by your doctor.  Fill these as soon as possible after surgery and take the medication as directed. Pain medications will not be refilled on weekends and must be approved by the doctor. 14. Remove nail polish on the operative foot and avoid getting pedicures prior to surgery. 15. Wash the night before surgery.  The night before surgery wash the foot and leg well with water and the antibacterial soap provided. Be sure to pay special attention to beneath the toenails and in between the toes.  Wash for at least three (3) minutes. Rinse thoroughly with water and dry well with a towel.  Perform this wash unless told not to do so by your physician.  Enclosed: 1 Ice pack (please put in freezer the night before surgery)   1 Hibiclens skin cleaner     Pre-op instructions  If you have any questions regarding the instructions, please do not hesitate to call our office.  [place]: [address], [place], [place] [address] -- [phone]  [place]: [address] , [place] [address] -- [phone]  [place]: [address], [place], [place] [address] -- [phone]   Website: [email or web address] 

## 2020-04-30 ENCOUNTER — Ambulatory Visit: Payer: Managed Care, Other (non HMO)

## 2020-05-02 ENCOUNTER — Encounter: Payer: Self-pay | Admitting: Physical Therapy

## 2020-05-02 ENCOUNTER — Ambulatory Visit: Payer: Managed Care, Other (non HMO) | Admitting: Physical Therapy

## 2020-05-02 ENCOUNTER — Encounter: Payer: Self-pay | Admitting: Podiatry

## 2020-05-02 ENCOUNTER — Other Ambulatory Visit: Payer: Self-pay

## 2020-05-02 DIAGNOSIS — M79672 Pain in left foot: Secondary | ICD-10-CM

## 2020-05-02 DIAGNOSIS — M6281 Muscle weakness (generalized): Secondary | ICD-10-CM

## 2020-05-02 DIAGNOSIS — M546 Pain in thoracic spine: Secondary | ICD-10-CM | POA: Diagnosis not present

## 2020-05-02 DIAGNOSIS — M542 Cervicalgia: Secondary | ICD-10-CM

## 2020-05-02 DIAGNOSIS — R293 Abnormal posture: Secondary | ICD-10-CM

## 2020-05-02 DIAGNOSIS — M79662 Pain in left lower leg: Secondary | ICD-10-CM

## 2020-05-02 DIAGNOSIS — M62838 Other muscle spasm: Secondary | ICD-10-CM

## 2020-05-02 NOTE — Therapy (Signed)
[facility] [address]  [place], [place], [address] Phone: [phone]   Fax:  [phone]  Physical Therapy Treatment  Patient Details  Name: Tonya [name] MRN: [id number] Date of Birth: [birth year] Referring Provider (PT): Crosby Oyster [place], [place]   Encounter Date: 05/02/2020   PT End of Session - 05/02/20 1017    Visit Number 17    Number of Visits 28    Date for PT Re-Evaluation 06/10/20    Authorization Type [employer]    PT Start Time [id number]    PT Stop Time 1015    PT Time Calculation (min) 41 min    Activity Tolerance Patient tolerated treatment well;Patient limited by pain    Behavior During Therapy [facility] for tasks assessed/performed           Past Medical History:  Diagnosis Date  . Hypertension   . Osteopenia     Past Surgical History:  Procedure Laterality Date  . ABDOMINAL HYSTERECTOMY  2015  . AUGMENTATION MAMMAPLASTY Left   . BREAST BIOPSY Right 02/2019  . BREAST IMPLANT EXCHANGE    . MASTECTOMY Left   . REDUCTION MAMMAPLASTY Right   . TONSILLECTOMY AND ADENOIDECTOMY  1958    There were no vitals filed for this visit.   Subjective Assessment - 05/02/20 0936    Subjective Has been picking her granddaughter up out of the car/high chair and her back seems to be doing good. Denies questions on recent HEP. Will be having foot surgery for the neuroma on 05/24/20.    Pertinent History osteopenia, HTN, L mastectomy    Diagnostic tests none recent    Patient Stated Goals improve pain    Currently in Pain? No/denies                             [facility] Adult PT Treatment/Exercise - 05/02/20 0001      Exercises   Exercises Ankle      Manual Therapy   Manual Therapy Joint mobilization;Soft tissue mobilization    Manual therapy comments prone    Joint Mobilization L posterior talar mobs grade III for plantarflexion    Soft tissue mobilization STM to L medial and lateral  gastroc-soleus complex, posterior tib, anterior tib, peroneals    Myofascial Release manual TPR to L medial gastroc, posterior tib, anterior tib   large tender palpable trigger points throughout   Passive ROM L ankle plantarflexion stretch       Ankle Exercises: Stretches   Gastroc Stretch 30 seconds;4 reps    Gastroc Stretch Limitations runner's stretch 2x, toes on wall 2x    Other Stretch L plantarflexion stretch in standing- unable to tolerate d/t 2nd toe pain      Ankle Exercises: Aerobic   Nustep L4 x 6 min (UEs/LEs)      Ankle Exercises: Seated   Other Seated Ankle Exercises L ankle inversion with red TB 2x10   limited ROM                 PT Education - 05/02/20 1017    Education Details advised to add toes-on-wall gastroc stretch to HEP    Person(s) Educated Patient    Methods Explanation;Demonstration    Comprehension Verbalized understanding            PT Short Term Goals - 04/29/20 1236      PT SHORT TERM GOAL #1   Title Patient  to be independent with initial HEP.    Time 3    Period Weeks    Status Achieved    Target Date 03/13/20             PT Long Term Goals - 05/02/20 1132      PT LONG TERM GOAL #1   Title Patient to be independent with advanced HEP.    Time 6    Period Weeks    Status Partially Met   met for current     PT LONG TERM GOAL #2   Title Patient to demonstrate cervical AROM WFL and without pain limiting.    Time 6    Period Weeks    Status Partially Met   mild pain remaining     PT LONG TERM GOAL #3   Title Patient to demonstrate B shoulder strength >/=4+/5.    Time 6    Period Weeks    Status Achieved      PT LONG TERM GOAL #4   Title Patient to report 80% improvement in tolerance for cleaning chores.    Time 6    Period Weeks    Status Partially Met   04/02/20:  Pt. noting 60% improvement in tolerance for cooking, laundry     PT LONG TERM GOAL #5   Title Patient to report ability to lift her granddaughter without  pain.    Time 6    Period Weeks    Status On-going   reporting delayed pain in her back after lifting her granddaughter, slightly improved     PT LONG TERM GOAL #6   Title Patient to demonstrate thoracic AROM WFL and without pain limiting.    Time 6    Period Weeks    Status On-going      PT LONG TERM GOAL #7   Title Patient to demonstrate B LE strength >/=4+/5.    Time 6    Period Weeks    Status On-going      PT LONG TERM GOAL #8   Title Patient to report tolerance for walking without limitations d/t L LE pain.    Time 6    Period Weeks    Status On-going      PT LONG TERM GOAL  #9   TITLE Patient to report 80% improvement in L LE pain.    Time 6    Period Weeks    Status On-going                 Plan - 05/02/20 1017    Clinical Impression Statement Patient reporting good tolerance for picking her granddaughter out of the car/high chair lately. Scheduled for removal of L Morton's neuroma on 05/24/20. Patient denies issues with recent HEP. Reviewed HEP for max carryover and tolerance. Patient able to demonstrate all exercises without issues with exception of plantarflexion stretch d/t L 2nd toe pain. Worked on [employer], TPR, joint mobs, and ankle stretching to address soft tissue restriction and limited ROM. Patient with several large tender palpable trigger points throughout the posterior and anterior lower leg. Able to demonstrate slightly improved plantarflexion ROM after stretching. Advised patient to omit plantarflexion stretch for HEP d/t poor tolerance and add in gastroc stretch. Patient reported understanding and without complaints at end of session.    Comorbidities osteopenia, HTN, L mastectomy    Rehab Potential Good    PT Frequency 2x / week    PT Duration 6 weeks    PT Treatment/Interventions ADLs/Self  Care Home Management;Cryotherapy;Moist Heat;Therapeutic exercise;Therapeutic activities;Functional mobility training;Neuromuscular re-education;Patient/family  education;Manual techniques;Taping;Energy conservation;Dry needling;Passive range of motion;Electrical Stimulation;Balance training;Stair training;Gait training;Ultrasound;Vasopneumatic Device    PT Next Visit Plan progress thoracic joint mobility and ROM, calf stretching/strengthening    Consulted and Agree with Plan of Care Patient           Patient will benefit from skilled therapeutic intervention in order to improve the following deficits and impairments:  Hypomobility, Decreased scar mobility, Decreased activity tolerance, Decreased strength, Pain, Impaired UE functional use, Increased fascial restricitons, Increased muscle spasms, Improper body mechanics, Decreased range of motion, Postural dysfunction, Impaired flexibility, Abnormal gait, Impaired sensation, Increased edema, Decreased balance, Difficulty walking  Visit Diagnosis: Pain in thoracic spine  Cervicalgia  Other muscle spasm  Muscle weakness (generalized)  Abnormal posture  Pain in left foot  Pain in left lower leg     Problem List Patient Active Problem List   Diagnosis Date Noted  . DCIS (ductal carcinoma in situ) 01/26/2020  . Low back pain 01/26/2020  . Chronic pain of both knees 09/19/2019  . Osteopenia   . Estrogen deficiency 04/14/2018  . Essential hypertension 01/10/2018  . Gastroesophageal reflux disease 01/10/2018  . Mild intermittent asthma without complication 95/02/3266  . Pain in left lower leg 03/03/2017  . Family history of colon cancer 02/23/2017  . Herpes simplex vulvovaginitis 02/18/2017  . Neuropathy 02/18/2017  . Elevated liver enzymes 02/17/2017  . Need for Streptococcus pneumoniae vaccination 02/17/2017  . Chondromalacia of left patella 12/28/2016  . Synovitis of left knee 12/28/2016  . Seasonal allergic rhinitis due to fungal spores 09/25/2016  . Bone mass 09/24/2016  . Chronic hip pain 09/24/2016  . Dark yellow-colored urine 09/24/2016  . Low oxygen saturation 09/24/2016    . Other specified postprocedural states 01/17/2015  . Abnormal stress electrocardiogram test 10/20/2013  . Malignant neoplasm of female breast ([place]) 12/15/2010  . Breast mass 12/11/2010  . Family history of malignant neoplasm of breast 12/11/2010     Janene Harvey, PT, DPT 05/02/20 11:34 AM   [facility] [address]  [place], [place], [address] Phone: [phone]   Fax:  [phone]  Name: Tonya [name] MRN: [id number] Date of Birth: 1951/04/24

## 2020-05-02 NOTE — Progress Notes (Signed)
Subjective:  Patient ID: Tonya [name], female    DOB: 25-May-1951,  MRN: [id number]  Chief Complaint  Patient presents with  . Plantar Fasciitis    "my plantar fasciitis is getting better, but the neuroma started hurting again November 1st and it burns and still has the popping sensation in the ball of my foot"  . Neuroma    69 y.o. female returns today with the above complaint. History confirmed with patient.  The ball of the foot the foot still very painful and still has burning shooting pain in the second toe.  Is worse with walking wearing shoes.  She is getting quite sick of this and is ready to discuss other treatment options.Last injection was not helpful.    Heel pain has improved  Objective:  Physical Exam: warm, good capillary refill, no trophic changes or ulcerative lesions, normal DP and PT pulses and normal sensory exam.   There are sharp pain on palpation and a positive Mulder's click to the second interspace plantarly  CLINICAL DATA:  Chronic pain around the second MTP joint.   EXAM: MRI OF THE LEFT TOES WITHOUT CONTRAST   TECHNIQUE: Multiplanar, multisequence MR imaging of the left forefoot was performed. No intravenous contrast was administered.   COMPARISON:  Left foot x-rays dated January 26, 2020.   FINDINGS: Bones/Joint/Cartilage   No marrow signal abnormality. No fracture or dislocation. Mild hallux valgus deformity and first MTP joint degenerative changes. No joint effusion.   Ligaments   Collateral ligaments are intact.  Lisfranc ligament is intact.   Muscles and Tendons Flexor and extensor tendons are intact. No muscle edema or atrophy.   Soft tissue 1.3 cm dumbbell-shaped T2 hypointense, T1 isointense mass between the second and third MTP joints, consistent with neuroma (series 4, image 25; series 5, image 25). No fluid collection or hematoma.   IMPRESSION: 1. 1.3 cm neuroma between the second and third MTP joints. 2. Mild hallux valgus  deformity and first MTP joint degenerative changes.     Electronically Signed   By: Titus Dubin M.D.   On: 04/05/2020 11:36  Assessment:   1. Morton neuroma, left      Plan:  Patient was evaluated and treated and all questions answered.  Today we again discussed the etiology and treatment options of Morton's neuroma in detail, including injection therapy which she has thus far failed multiple treatments of.  I discussed with her that we have 2 options surgically, neurectomy and resection of the neuroma with implantation into muscle and/or bone or, more conservatively decompression with percutaneous release of the deep transverse intermetatarsal ligament.  I discussed with her the risks and benefits of potential complications of both procedures.  I think it is possible that decompression potentially could offer relief but I think neurectomy at this point would be more predictable for relief.  She states she is not concerned with having numbness considering how much pain she is in now.  Given this is the first time to neurectomy for this neuroma, I recommended a dorsal approach incision with resection of the neuroma and neurectomy with implantation into adjacent muscle.  We discussed the risk, benefits and potential complications in detail as well as expected postoperative course.  All questions were addressed.  No guarantees were given.  Informed consent was signed and reviewed in the office today.  Surgery will be scheduled at a mutually agreeable date.   Surgical plan:  Procedure: -Neurectomy left foot of second interspace  Location: [facility]  Anesthesia plan: -Local anesthesia with IV sedation  Postoperative pain plan: - Tylenol 1000 mg every 6 hours, ibuprofen 600 mg every 6 hours, gabapentin 300 mg every 8 hours x5 days, oxycodone 5 mg 1-2 tabs every 6 hours only as needed  DVT prophylaxis: -None required, she will be WBAT in surgical shoe  WB  Restrictions / DME needs: -Surgical shoe, this was dispensed today

## 2020-05-06 ENCOUNTER — Other Ambulatory Visit: Payer: Self-pay

## 2020-05-06 ENCOUNTER — Ambulatory Visit: Payer: Managed Care, Other (non HMO) | Admitting: Physical Therapy

## 2020-05-06 ENCOUNTER — Encounter: Payer: Self-pay | Admitting: Physical Therapy

## 2020-05-06 DIAGNOSIS — M542 Cervicalgia: Secondary | ICD-10-CM

## 2020-05-06 DIAGNOSIS — M546 Pain in thoracic spine: Secondary | ICD-10-CM | POA: Diagnosis not present

## 2020-05-06 DIAGNOSIS — R293 Abnormal posture: Secondary | ICD-10-CM

## 2020-05-06 DIAGNOSIS — M62838 Other muscle spasm: Secondary | ICD-10-CM

## 2020-05-06 DIAGNOSIS — M79662 Pain in left lower leg: Secondary | ICD-10-CM

## 2020-05-06 DIAGNOSIS — M79672 Pain in left foot: Secondary | ICD-10-CM

## 2020-05-06 DIAGNOSIS — M6281 Muscle weakness (generalized): Secondary | ICD-10-CM

## 2020-05-06 NOTE — Therapy (Signed)
[facility] [address]  [place], [place], [address] Phone: [phone]   Fax:  [phone]  Physical Therapy Treatment  Patient Details  Name: Tonya [name] MRN: [id number] Date of Birth: 02/03/1951 Referring Provider (PT): Crosby Oyster [place], [place]   Encounter Date: 05/06/2020   PT End of Session - 05/06/20 1532    Visit Number 18    Number of Visits 28    Date for PT Re-Evaluation 06/10/20    Authorization Type [employer]    PT Start Time 1451    PT Stop Time 1539   moist heat   PT Time Calculation (min) 48 min    Activity Tolerance Patient tolerated treatment well;Patient limited by pain    Behavior During Therapy [facility] for tasks assessed/performed           Past Medical History:  Diagnosis Date  . Hypertension   . Osteopenia     Past Surgical History:  Procedure Laterality Date  . ABDOMINAL HYSTERECTOMY  2015  . AUGMENTATION MAMMAPLASTY Left   . BREAST BIOPSY Right 02/2019  . BREAST IMPLANT EXCHANGE    . MASTECTOMY Left   . REDUCTION MAMMAPLASTY Right   . TONSILLECTOMY AND ADENOIDECTOMY  1958    There were no vitals filed for this visit.   Subjective Assessment - 05/06/20 1452    Subjective Last night she noticed L sided scapular pain- not sure if it was from drinking wine last night.    Pertinent History osteopenia, HTN, L mastectomy    Diagnostic tests none recent    Patient Stated Goals improve pain    Currently in Pain? No/denies                             [facility] Adult PT Treatment/Exercise - 05/06/20 0001      Exercises   Exercises Knee/Hip      Knee/Hip Exercises: Aerobic   Nustep L4 x 6 min (UEs/LEs)      Shoulder Exercises: Stretch   Other Shoulder Stretches L lats stretch at wall 2x30"    Other Shoulder Stretches R diagonal prayer stretch 10x5"      Modalities   Modalities Moist Heat      Moist Heat Therapy   Number Minutes Moist Heat 10 Minutes     Moist Heat Location --   L lats     Manual Therapy   Manual Therapy Soft tissue mobilization;Myofascial release    Manual therapy comments R sidelying    Soft tissue mobilization STM and IASTM to L lats (muscle belly and insertion) and serratus anterior, L QL    Myofascial Release manual TPR to L lats and serratus anterior   multiple tender trigger points                 PT Education - 05/06/20 1529    Education Details administered edu on benefits of yoga as well as sources Research scientist (physical sciences)) Educated Patient    Methods Explanation;Tactile cues;Verbal cues;Handout    Comprehension Verbalized understanding            PT Short Term Goals - 04/29/20 1236      PT SHORT TERM GOAL #1   Title Patient to be independent with initial HEP.    Time 3    Period Weeks    Status Achieved    Target Date 03/13/20  PT Long Term Goals - 05/02/20 1132      PT LONG TERM GOAL #1   Title Patient to be independent with advanced HEP.    Time 6    Period Weeks    Status Partially Met   met for current     PT LONG TERM GOAL #2   Title Patient to demonstrate cervical AROM WFL and without pain limiting.    Time 6    Period Weeks    Status Partially Met   mild pain remaining     PT LONG TERM GOAL #3   Title Patient to demonstrate B shoulder strength >/=4+/5.    Time 6    Period Weeks    Status Achieved      PT LONG TERM GOAL #4   Title Patient to report 80% improvement in tolerance for cleaning chores.    Time 6    Period Weeks    Status Partially Met   04/02/20:  Pt. noting 60% improvement in tolerance for cooking, laundry     PT LONG TERM GOAL #5   Title Patient to report ability to lift her granddaughter without pain.    Time 6    Period Weeks    Status On-going   reporting delayed pain in her back after lifting her granddaughter, slightly improved     PT LONG TERM GOAL #6   Title Patient to demonstrate thoracic AROM WFL and without pain limiting.    Time  6    Period Weeks    Status On-going      PT LONG TERM GOAL #7   Title Patient to demonstrate B LE strength >/=4+/5.    Time 6    Period Weeks    Status On-going      PT LONG TERM GOAL #8   Title Patient to report tolerance for walking without limitations d/t L LE pain.    Time 6    Period Weeks    Status On-going      PT LONG TERM GOAL  #9   TITLE Patient to report 80% improvement in L LE pain.    Time 6    Period Weeks    Status On-going                 Plan - 05/06/20 1533    Clinical Impression Statement Patient reporting return of L sided periscapular pain last night without known cause. Worked on [employer], IASTM, and manual TPR to address pain, with patient demonstrating several palpable and tender trigger points in the L lats and serratus anterior.  Followed MT with gentle lats stretching with patient tolerating this well. Ended session with moist heat to L lats for improvement in tightness. Also educated patient on benefits of yoga for improvement in muscle flexibility and stress release as patient remarking on her pattern of full-body tightness. Patient reported understanding of all edu provided today and without complaints at end of session.    Comorbidities osteopenia, HTN, L mastectomy    Rehab Potential Good    PT Frequency 2x / week    PT Duration 6 weeks    PT Treatment/Interventions ADLs/Self Care Home Management;Cryotherapy;Moist Heat;Therapeutic exercise;Therapeutic activities;Functional mobility training;Neuromuscular re-education;Patient/family education;Manual techniques;Taping;Energy conservation;Dry needling;Passive range of motion;Electrical Stimulation;Balance training;Stair training;Gait training;Ultrasound;Vasopneumatic Device    PT Next Visit Plan progress thoracic joint mobility and ROM, calf stretching/strengthening    Consulted and Agree with Plan of Care Patient           Patient  will benefit from skilled therapeutic intervention in order to  improve the following deficits and impairments:  Hypomobility, Decreased scar mobility, Decreased activity tolerance, Decreased strength, Pain, Impaired UE functional use, Increased fascial restricitons, Increased muscle spasms, Improper body mechanics, Decreased range of motion, Postural dysfunction, Impaired flexibility, Abnormal gait, Impaired sensation, Increased edema, Decreased balance, Difficulty walking  Visit Diagnosis: Pain in thoracic spine  Cervicalgia  Other muscle spasm  Muscle weakness (generalized)  Abnormal posture  Pain in left foot  Pain in left lower leg     Problem List Patient Active Problem List   Diagnosis Date Noted  . DCIS (ductal carcinoma in situ) 01/26/2020  . Low back pain 01/26/2020  . Chronic pain of both knees 09/19/2019  . Osteopenia   . Estrogen deficiency 04/14/2018  . Essential hypertension 01/10/2018  . Gastroesophageal reflux disease 01/10/2018  . Mild intermittent asthma without complication 16/03/9603  . Pain in left lower leg 03/03/2017  . Family history of colon cancer 02/23/2017  . Herpes simplex vulvovaginitis 02/18/2017  . Neuropathy 02/18/2017  . Elevated liver enzymes 02/17/2017  . Need for Streptococcus pneumoniae vaccination 02/17/2017  . Chondromalacia of left patella 12/28/2016  . Synovitis of left knee 12/28/2016  . Seasonal allergic rhinitis due to fungal spores 09/25/2016  . Bone mass 09/24/2016  . Chronic hip pain 09/24/2016  . Dark yellow-colored urine 09/24/2016  . Low oxygen saturation 09/24/2016  . Other specified postprocedural states 01/17/2015  . Abnormal stress electrocardiogram test 10/20/2013  . Malignant neoplasm of female breast ([place]) 12/15/2010  . Breast mass 12/11/2010  . Family history of malignant neoplasm of breast 12/11/2010     Janene Harvey, PT, DPT 05/06/20 3:41 PM   [facility] [address]  [place], [place],  [address] Phone: [phone]   Fax:  [phone]  Name: Tonya [name] MRN: [id number] Date of Birth: 1950-10-26

## 2020-05-09 ENCOUNTER — Encounter: Payer: Self-pay | Admitting: Physical Therapy

## 2020-05-09 ENCOUNTER — Ambulatory Visit: Payer: Managed Care, Other (non HMO) | Admitting: Physical Therapy

## 2020-05-09 ENCOUNTER — Other Ambulatory Visit: Payer: Self-pay

## 2020-05-09 DIAGNOSIS — R293 Abnormal posture: Secondary | ICD-10-CM

## 2020-05-09 DIAGNOSIS — M62838 Other muscle spasm: Secondary | ICD-10-CM

## 2020-05-09 DIAGNOSIS — M79662 Pain in left lower leg: Secondary | ICD-10-CM

## 2020-05-09 DIAGNOSIS — M542 Cervicalgia: Secondary | ICD-10-CM

## 2020-05-09 DIAGNOSIS — M546 Pain in thoracic spine: Secondary | ICD-10-CM

## 2020-05-09 DIAGNOSIS — M79672 Pain in left foot: Secondary | ICD-10-CM

## 2020-05-09 DIAGNOSIS — M6281 Muscle weakness (generalized): Secondary | ICD-10-CM

## 2020-05-09 NOTE — Therapy (Signed)
[facility] [address]  [name], [place], [address] Phone: [phone]   Fax:  [phone]  Physical Therapy Treatment  Patient Details  Name: Tonya [name] MRN: [id number] Date of Birth: March 25, 1951 Referring Provider (PT): Crosby Oyster [place], [place]   Encounter Date: 05/09/2020   PT End of Session - 05/09/20 1110    Visit Number 19    Number of Visits 28    Date for PT Re-Evaluation 06/10/20    Authorization Type [employer]    PT Start Time 1105   pt late   PT Stop Time 1145    PT Time Calculation (min) 40 min    Activity Tolerance Patient tolerated treatment well    Behavior During Therapy [facility] for tasks assessed/performed           Past Medical History:  Diagnosis Date  . Hypertension   . Osteopenia     Past Surgical History:  Procedure Laterality Date  . ABDOMINAL HYSTERECTOMY  2015  . AUGMENTATION MAMMAPLASTY Left   . BREAST BIOPSY Right 02/2019  . BREAST IMPLANT EXCHANGE    . MASTECTOMY Left   . REDUCTION MAMMAPLASTY Right   . TONSILLECTOMY AND ADENOIDECTOMY  1958    There were no vitals filed for this visit.   Subjective Assessment - 05/09/20 1106    Subjective Apologetic for being late as she had an important phone call. The spot that was bothering her last time is feeling better, but it is still tender. Tried wearing sneakers yesterday and that bothered her in her toes.    Pertinent History osteopenia, HTN, L mastectomy    Diagnostic tests none recent    Patient Stated Goals improve pain    Currently in Pain? No/denies    Pain Orientation --    Pain Descriptors / Indicators --    Pain Type --                             [facility] Adult PT Treatment/Exercise - 05/09/20 0001      Neck Exercises: Machines for Strengthening   UBE (Upper Arm Bike) L2.5 x 3 min forward/3 min back      Knee/Hip Exercises: Stretches   Other Knee/Hip Stretches L posterior tib stretch 2x30"      Other Knee/Hip Stretches L gastroc stretch with toes on 2 airex ovals 2x30"       Manual Therapy   Manual Therapy Taping    Kinesiotex Create Space      Kinesiotix   Create Space L Achilles tendon K-taping pattern: 3 I strips along posterior tendon from arch of foot and from arch of foot along medial ankle and lateral ankle    "light touch" sensitive skin K-tape used     Ankle Exercises: Clinical research associate 30 seconds;4 reps    Gastroc Stretch Limitations runner's stretch 2x, toes on wall 2x    Other Stretch L plantarflexion stretch in standing on wall x 30 sec                     PT Short Term Goals - 04/29/20 1236      PT SHORT TERM GOAL #1   Title Patient to be independent with initial HEP.    Time 3    Period Weeks    Status Achieved    Target Date 03/13/20  PT Long Term Goals - 05/02/20 1132      PT LONG TERM GOAL #1   Title Patient to be independent with advanced HEP.    Time 6    Period Weeks    Status Partially Met   met for current     PT LONG TERM GOAL #2   Title Patient to demonstrate cervical AROM WFL and without pain limiting.    Time 6    Period Weeks    Status Partially Met   mild pain remaining     PT LONG TERM GOAL #3   Title Patient to demonstrate B shoulder strength >/=4+/5.    Time 6    Period Weeks    Status Achieved      PT LONG TERM GOAL #4   Title Patient to report 80% improvement in tolerance for cleaning chores.    Time 6    Period Weeks    Status Partially Met   04/02/20:  Pt. noting 60% improvement in tolerance for cooking, laundry     PT LONG TERM GOAL #5   Title Patient to report ability to lift her granddaughter without pain.    Time 6    Period Weeks    Status On-going   reporting delayed pain in her back after lifting her granddaughter, slightly improved     PT LONG TERM GOAL #6   Title Patient to demonstrate thoracic AROM WFL and without pain limiting.    Time 6    Period Weeks    Status  On-going      PT LONG TERM GOAL #7   Title Patient to demonstrate B LE strength >/=4+/5.    Time 6    Period Weeks    Status On-going      PT LONG TERM GOAL #8   Title Patient to report tolerance for walking without limitations d/t L LE pain.    Time 6    Period Weeks    Status On-going      PT LONG TERM GOAL  #9   TITLE Patient to report 80% improvement in L LE pain.    Time 6    Period Weeks    Status On-going                 Plan - 05/09/20 1123    Clinical Impression Statement [name] noting improvement in tenderness in "spot from last time" around shoulder blades and primary complaint is L medial ankle pain occasionally.  Trialed Sensitive Skin K-taping to L ankle in "Achilles Tendon Pattern" at 30% stretch for improved comfort and ankle support to hopefully wean away from arch strap she is currently using in her shoe.  Able to tolerate all LE/calf stretching without issue today and leaving session pain free post-taping application.    Comorbidities osteopenia, HTN, L mastectomy    Rehab Potential Good    PT Treatment/Interventions ADLs/Self Care Home Management;Cryotherapy;Moist Heat;Therapeutic exercise;Therapeutic activities;Functional mobility training;Neuromuscular re-education;Patient/family education;Manual techniques;Taping;Energy conservation;Dry needling;Passive range of motion;Electrical Stimulation;Balance training;Stair training;Gait training;Ultrasound;Vasopneumatic Device    PT Next Visit Plan progress thoracic joint mobility and ROM, calf stretching/strengthening    Consulted and Agree with Plan of Care Patient           Patient will benefit from skilled therapeutic intervention in order to improve the following deficits and impairments:  Hypomobility, Decreased scar mobility, Decreased activity tolerance, Decreased strength, Pain, Impaired UE functional use, Increased fascial restricitons, Increased muscle spasms, Improper body mechanics, Decreased range  of  motion, Postural dysfunction, Impaired flexibility, Abnormal gait, Impaired sensation, Increased edema, Decreased balance, Difficulty walking  Visit Diagnosis: Pain in thoracic spine  Cervicalgia  Other muscle spasm  Muscle weakness (generalized)  Abnormal posture  Pain in left foot  Pain in left lower leg     Problem List Patient Active Problem List   Diagnosis Date Noted  . DCIS (ductal carcinoma in situ) 01/26/2020  . Low back pain 01/26/2020  . Chronic pain of both knees 09/19/2019  . Osteopenia   . Estrogen deficiency 04/14/2018  . Essential hypertension 01/10/2018  . Gastroesophageal reflux disease 01/10/2018  . Mild intermittent asthma without complication 70/48/8891  . Pain in left lower leg 03/03/2017  . Family history of colon cancer 02/23/2017  . Herpes simplex vulvovaginitis 02/18/2017  . Neuropathy 02/18/2017  . Elevated liver enzymes 02/17/2017  . Need for Streptococcus pneumoniae vaccination 02/17/2017  . Chondromalacia of left patella 12/28/2016  . Synovitis of left knee 12/28/2016  . Seasonal allergic rhinitis due to fungal spores 09/25/2016  . Bone mass 09/24/2016  . Chronic hip pain 09/24/2016  . Dark yellow-colored urine 09/24/2016  . Low oxygen saturation 09/24/2016  . Other specified postprocedural states 01/17/2015  . Abnormal stress electrocardiogram test 10/20/2013  . Malignant neoplasm of female breast ([place]) 12/15/2010  . Breast mass 12/11/2010  . Family history of malignant neoplasm of breast 12/11/2010    Bess Harvest, PTA 05/09/20 12:01 PM   [facility] [address]  [place], [place], [address] Phone: [phone]   Fax:  [phone]  Name: Tonya [name] MRN: [id number] Date of Birth: 24-Jun-1950

## 2020-05-10 ENCOUNTER — Ambulatory Visit: Payer: Medicare Other | Admitting: Podiatry

## 2020-05-13 ENCOUNTER — Telehealth: Payer: Self-pay

## 2020-05-13 ENCOUNTER — Encounter: Payer: Self-pay | Admitting: Physical Therapy

## 2020-05-13 ENCOUNTER — Other Ambulatory Visit: Payer: Self-pay

## 2020-05-13 ENCOUNTER — Ambulatory Visit: Payer: Managed Care, Other (non HMO) | Admitting: Physical Therapy

## 2020-05-13 DIAGNOSIS — M546 Pain in thoracic spine: Secondary | ICD-10-CM | POA: Diagnosis not present

## 2020-05-13 DIAGNOSIS — R293 Abnormal posture: Secondary | ICD-10-CM

## 2020-05-13 DIAGNOSIS — M79662 Pain in left lower leg: Secondary | ICD-10-CM

## 2020-05-13 DIAGNOSIS — M542 Cervicalgia: Secondary | ICD-10-CM

## 2020-05-13 DIAGNOSIS — M79672 Pain in left foot: Secondary | ICD-10-CM

## 2020-05-13 DIAGNOSIS — M6281 Muscle weakness (generalized): Secondary | ICD-10-CM

## 2020-05-13 DIAGNOSIS — M62838 Other muscle spasm: Secondary | ICD-10-CM

## 2020-05-13 NOTE — Therapy (Signed)
[facility] [address]  [place], [place], [address] Phone: [phone]   Fax:  [phone]  Physical Therapy Progress Note  Patient Details  Name: Tonya [name] MRN: [id number] Date of Birth: [birth year] Referring Provider (PT): Crosby Oyster [place], [place]   Encounter Date: 05/13/2020   PT End of Session - 05/13/20 1154    Visit Number 20    Number of Visits 28    Date for PT Re-Evaluation 06/10/20    Authorization Type [employer]    PT Start Time 1102    PT Stop Time 1146    PT Time Calculation (min) 44 min    Activity Tolerance Patient tolerated treatment well    Behavior During Therapy [facility] for tasks assessed/performed           Past Medical History:  Diagnosis Date  . Hypertension   . Osteopenia     Past Surgical History:  Procedure Laterality Date  . ABDOMINAL HYSTERECTOMY  2015  . AUGMENTATION MAMMAPLASTY Left   . BREAST BIOPSY Right 02/2019  . BREAST IMPLANT EXCHANGE    . MASTECTOMY Left   . REDUCTION MAMMAPLASTY Right   . TONSILLECTOMY AND ADENOIDECTOMY  1958    There were no vitals filed for this visit.   Subjective Assessment - 05/13/20 1103    Subjective Has some new shoes that are feeling pretty good. Calves are feeling looser. Reports that she did a lot of lifting her granddaughter over the weekend.    Pertinent History osteopenia, HTN, L mastectomy    Diagnostic tests none recent    Patient Stated Goals improve pain    Currently in Pain? No/denies              [facility] PT Assessment - 05/13/20 0001      Assessment   Medical Diagnosis Strain of L UT, somatic dysfunction of LE    Referring Provider (PT) [name], DO    Onset Date/Surgical Date --   4.5 years     AROM   Cervical Flexion 41    Cervical Extension 41    Cervical - Right Side Bend 31    Cervical - Left Side Bend 28    Cervical - Right Rotation 50    Cervical - Left Rotation 50    Thoracic Flexion [facility]     Thoracic Extension mildly limited    Thoracic - Right Side Bend mildly limited    Thoracic - Left Side Bend mildly limited    Thoracic - Right Rotation [facility]    Thoracic - Left Rotation [facility]      Strength   Right Hip Flexion 4+/5    Right Hip ABduction 4/5    Right Hip ADduction 4/5    Left Hip Flexion 4+/5    Left Hip ABduction 4/5    Left Hip ADduction 4/5    Right Knee Flexion 5/5    Right Knee Extension 5/5    Left Knee Flexion 4+/5    Left Knee Extension 4+/5    Right Ankle Dorsiflexion 5/5    Right Ankle Plantar Flexion 5/5    Right Ankle Inversion 5/5    Right Ankle Eversion 5/5    Left Ankle Dorsiflexion 5/5    Left Ankle Plantar Flexion 4/5   10 reps with limited ROM and 2nd toe pain   Left Ankle Inversion 4/5    Left Ankle Eversion 4+/5  [facility] PT Treatment/Exercise - 05/13/20 0001      Knee/Hip Exercises: Sidelying   Hip ABduction Strengthening;Right;Left;1 set;10 reps    Hip ABduction Limitations cues for alignment     Hip ADduction Strengthening;Right;Left;1 set;10 reps    Hip ADduction Limitations opposite LE resting on bolster      Shoulder Exercises: ROM/Strengthening   Nustep L5 x 6 min (UEs/LEs)                  PT Education - 05/13/20 1154    Education Details update to HEP; discussion on objective progress and remaining impairments    Person(s) Educated Patient    Methods Explanation;Demonstration;Tactile cues;Verbal cues;Handout    Comprehension Verbalized understanding;Returned demonstration            PT Short Term Goals - 05/13/20 1108      PT SHORT TERM GOAL #1   Title Patient to be independent with initial HEP.    Time 3    Period Weeks    Status Achieved    Target Date 03/13/20             PT Long Term Goals - 05/13/20 1108      PT LONG TERM GOAL #1   Title Patient to be independent with advanced HEP.    Time 6    Period Weeks    Status Partially Met   met for current      PT LONG TERM GOAL #2   Title Patient to demonstrate cervical AROM WFL and without pain limiting.    Time 6    Period Weeks    Status Partially Met   Cervical AROM has improved in flexion B sidebending, and R rotation     PT LONG TERM GOAL #3   Title Patient to demonstrate B shoulder strength >/=4+/5.    Time 6    Period Weeks    Status Achieved      PT LONG TERM GOAL #4   Title Patient to report 80% improvement in tolerance for cleaning chores.    Time 6    Period Weeks    Status Achieved   reports 100% resolution of pain     PT LONG TERM GOAL #5   Title Patient to report ability to lift her granddaughter without pain.    Time 6    Period Weeks    Status Achieved   met 05/13/20     PT LONG TERM GOAL #6   Title Patient to demonstrate thoracic AROM WFL and without pain limiting.    Time 6    Period Weeks    Status Partially Met   Thoracic AROM has improved in B sidebending and B rotation.     PT LONG TERM GOAL #7   Title Patient to demonstrate B LE strength >/=4+/5.    Time 6    Period Weeks    Status Partially Met   LE strength testing revealed improvement in hip flexion, R knee flexion/extension, R ankle strength in all planes, and L ankle dorsiflexion     PT LONG TERM GOAL #8   Title Patient to report tolerance for walking without limitations d/t L LE pain.    Time 6    Period Weeks    Status On-going   2-3 hours     PT LONG TERM GOAL  #9   TITLE Patient to report 80% improvement in L LE pain.    Time 6    Period Weeks  Status On-going   reporting 60-70% improvement in the L lower leg pain, and 25% improvement in L foot/heel pain, attributing some of this to hew new shoes                Plan - 05/13/20 1155    Clinical Impression Statement Patient arrived to session with no new complaints. Noting 60-70% improvement in the L lower leg pain and 25% improvement in L foot/heel pain, attributing some of this to her new shoes. Notes that she did a lot of  lifting her granddaughter over the weekend without any residual pain, thus this goal was met. Also reports 100% resolution of pain with cleaning chores. Patient reports tolerance for 2-3 hours of walking before onset of pain at this time. Cervical AROM has improved in flexion B sidebending, and R rotation. Thoracic AROM has improved in B sidebending and B rotation. LE strength testing revealed improvement in hip flexion, R knee flexion/extension, R ankle strength in all planes, and L ankle dorsiflexion. Did demonstrate decline in B hip abduction/adduction strength and remaining weakness in the L ankle. HEP was updated with exercises that were well-tolerated today to address remaining impairments. Patient is demonstrating good progress towards goals. Would benefit from continued skilled PT services to address pain.    Comorbidities osteopenia, HTN, L mastectomy    Rehab Potential Good    PT Treatment/Interventions ADLs/Self Care Home Management;Cryotherapy;Moist Heat;Therapeutic exercise;Therapeutic activities;Functional mobility training;Neuromuscular re-education;Patient/family education;Manual techniques;Taping;Energy conservation;Dry needling;Passive range of motion;Electrical Stimulation;Balance training;Stair training;Gait training;Ultrasound;Vasopneumatic Device    PT Next Visit Plan progress thoracic joint mobility and ROM, calf stretching/strengthening    Consulted and Agree with Plan of Care Patient           Patient will benefit from skilled therapeutic intervention in order to improve the following deficits and impairments:  Hypomobility, Decreased scar mobility, Decreased activity tolerance, Decreased strength, Pain, Impaired UE functional use, Increased fascial restricitons, Increased muscle spasms, Improper body mechanics, Decreased range of motion, Postural dysfunction, Impaired flexibility, Abnormal gait, Impaired sensation, Increased edema, Decreased balance, Difficulty walking  Visit  Diagnosis: Pain in thoracic spine  Cervicalgia  Other muscle spasm  Muscle weakness (generalized)  Abnormal posture  Pain in left foot  Pain in left lower leg     Problem List Patient Active Problem List   Diagnosis Date Noted  . DCIS (ductal carcinoma in situ) 01/26/2020  . Low back pain 01/26/2020  . Chronic pain of both knees 09/19/2019  . Osteopenia   . Estrogen deficiency 04/14/2018  . Essential hypertension 01/10/2018  . Gastroesophageal reflux disease 01/10/2018  . Mild intermittent asthma without complication 51/76/1607  . Pain in left lower leg 03/03/2017  . Family history of colon cancer 02/23/2017  . Herpes simplex vulvovaginitis 02/18/2017  . Neuropathy 02/18/2017  . Elevated liver enzymes 02/17/2017  . Need for Streptococcus pneumoniae vaccination 02/17/2017  . Chondromalacia of left patella 12/28/2016  . Synovitis of left knee 12/28/2016  . Seasonal allergic rhinitis due to fungal spores 09/25/2016  . Bone mass 09/24/2016  . Chronic hip pain 09/24/2016  . Dark yellow-colored urine 09/24/2016  . Low oxygen saturation 09/24/2016  . Other specified postprocedural states 01/17/2015  . Abnormal stress electrocardiogram test 10/20/2013  . Malignant neoplasm of female breast ([place]) 12/15/2010  . Breast mass 12/11/2010  . Family history of malignant neoplasm of breast 12/11/2010     Janene Harvey, PT, DPT 05/13/20 12:00 PM   [facility] [address]  [place], [place], [address] Phone: [phone]   Fax:  [phone]  Name: Tonya [name] MRN: [id number] Date of Birth: 1951/05/25

## 2020-05-13 NOTE — Telephone Encounter (Signed)
DOS 05/24/2020  NEURECTOMY LT - 28080  [employer] EFFECTIVE DATE - 06/23/2019  PLAN DEDUCTIBLE - $1300.00 W/ $0.00 REMAINING OUT OF POCKET - $3200.00 W/ $0.00 REMAINING COPAY $0.00 COINSURANCE - 100%  SPOKE TO ERIK S AT [employer], HE STATED NO PRECERT IS REQUIRED FOR CPT 28080. CALL REF # [id number]

## 2020-05-20 ENCOUNTER — Other Ambulatory Visit: Payer: Self-pay

## 2020-05-20 ENCOUNTER — Ambulatory Visit: Payer: Managed Care, Other (non HMO)

## 2020-05-20 DIAGNOSIS — M79672 Pain in left foot: Secondary | ICD-10-CM

## 2020-05-20 DIAGNOSIS — M546 Pain in thoracic spine: Secondary | ICD-10-CM

## 2020-05-20 DIAGNOSIS — M6281 Muscle weakness (generalized): Secondary | ICD-10-CM

## 2020-05-20 DIAGNOSIS — M62838 Other muscle spasm: Secondary | ICD-10-CM

## 2020-05-20 DIAGNOSIS — R293 Abnormal posture: Secondary | ICD-10-CM

## 2020-05-20 DIAGNOSIS — M542 Cervicalgia: Secondary | ICD-10-CM

## 2020-05-20 NOTE — Therapy (Signed)
[facility] [address]  [place], [place], [address] Phone: [phone]   Fax:  [phone]  Physical Therapy Treatment  Patient Details  Name: Tonya [name] MRN: [id number] Date of Birth: [birth year] Referring Provider (PT): Crosby Oyster [place], [place]   Encounter Date: 05/20/2020   PT End of Session - 05/20/20 1024    Visit Number 21    Number of Visits 28    Date for PT Re-Evaluation 06/10/20    Authorization Type [employer]    PT Start Time 1020    PT Stop Time 1058    PT Time Calculation (min) 38 min    Activity Tolerance Patient tolerated treatment well    Behavior During Therapy [facility] for tasks assessed/performed           Past Medical History:  Diagnosis Date  . Hypertension   . Osteopenia     Past Surgical History:  Procedure Laterality Date  . ABDOMINAL HYSTERECTOMY  2015  . AUGMENTATION MAMMAPLASTY Left   . BREAST BIOPSY Right 02/2019  . BREAST IMPLANT EXCHANGE    . MASTECTOMY Left   . REDUCTION MAMMAPLASTY Right   . TONSILLECTOMY AND ADENOIDECTOMY  1958    There were no vitals filed for this visit.   Subjective Assessment - 05/20/20 1023    Subjective Pt. doing well.    Pertinent History osteopenia, HTN, L mastectomy    Diagnostic tests none recent    Patient Stated Goals improve pain    Currently in Pain? No/denies    Pain Score 0-No pain    Multiple Pain Sites No                             [facility] Adult PT Treatment/Exercise - 05/20/20 0001      Neck Exercises: Standing   Other Standing Exercises Standing shoulder rolls with 4# focusing on retraction/depression x 20      Lumbar Exercises: Machines for Strengthening   Other Lumbar Machine Exercise B low, mid row 20# x 10 reps       Lumbar Exercises: Supine   Bridge with Ball Squeeze 15 reps;3 seconds    Bridge with clamshell 15 reps;3 seconds      Knee/Hip Exercises: Aerobic   Nustep L4 x 6 min (UEs/LEs)        Knee/Hip Exercises: Sidelying   Hip ABduction Right;Left;15 reps    Hip ADduction Right;Left;15 reps      Neck Exercises: Stretches   Upper Trapezius Stretch Right;Left;1 rep;30 seconds    Levator Stretch Left;2 reps;30 seconds                    PT Short Term Goals - 05/13/20 1108      PT SHORT TERM GOAL #1   Title Patient to be independent with initial HEP.    Time 3    Period Weeks    Status Achieved    Target Date 03/13/20             PT Long Term Goals - 05/13/20 1108      PT LONG TERM GOAL #1   Title Patient to be independent with advanced HEP.    Time 6    Period Weeks    Status Partially Met   met for current     PT LONG TERM GOAL #2   Title Patient to demonstrate cervical AROM WFL and without pain limiting.  Time 6    Period Weeks    Status Partially Met   Cervical AROM has improved in flexion B sidebending, and R rotation     PT LONG TERM GOAL #3   Title Patient to demonstrate B shoulder strength >/=4+/5.    Time 6    Period Weeks    Status Achieved      PT LONG TERM GOAL #4   Title Patient to report 80% improvement in tolerance for cleaning chores.    Time 6    Period Weeks    Status Achieved   reports 100% resolution of pain     PT LONG TERM GOAL #5   Title Patient to report ability to lift her granddaughter without pain.    Time 6    Period Weeks    Status Achieved   met 05/13/20     PT LONG TERM GOAL #6   Title Patient to demonstrate thoracic AROM WFL and without pain limiting.    Time 6    Period Weeks    Status Partially Met   Thoracic AROM has improved in B sidebending and B rotation.     PT LONG TERM GOAL #7   Title Patient to demonstrate B LE strength >/=4+/5.    Time 6    Period Weeks    Status Partially Met   LE strength testing revealed improvement in hip flexion, R knee flexion/extension, R ankle strength in all planes, and L ankle dorsiflexion     PT LONG TERM GOAL #8   Title Patient to report tolerance for  walking without limitations d/t L LE pain.    Time 6    Period Weeks    Status On-going   2-3 hours     PT LONG TERM GOAL  #9   TITLE Patient to report 80% improvement in L LE pain.    Time 6    Period Weeks    Status On-going   reporting 60-70% improvement in the L lower leg pain, and 25% improvement in L foot/heel pain, attributing some of this to hew new shoes                Plan - 05/20/20 1024    Clinical Impression Statement Tonya [name] reports she has been feeling well overall with improved foot/ankle pain since trying new shoes.  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  Tonya [name] notes she feels she is improving with therapy.    Comorbidities osteopenia, HTN, L mastectomy    Rehab Potential Good    PT Frequency 2x / week    PT Duration 6 weeks    PT Treatment/Interventions ADLs/Self Care Home Management;Cryotherapy;Moist Heat;Therapeutic exercise;Therapeutic activities;Functional mobility training;Neuromuscular re-education;Patient/family education;Manual techniques;Taping;Energy conservation;Dry needling;Passive range of motion;Electrical Stimulation;Balance training;Stair training;Gait training;Ultrasound;Vasopneumatic Device    PT Next Visit Plan progress thoracic joint mobility and ROM, calf stretching/strengthening    Consulted and Agree with Plan of Care Patient           Patient will benefit from skilled therapeutic intervention in order to improve the following deficits and impairments:  Hypomobility, Decreased scar mobility, Decreased activity tolerance, Decreased strength, Pain, Impaired UE functional use, Increased fascial restricitons, Increased muscle spasms, Improper body mechanics, Decreased range of motion, Postural dysfunction, Impaired flexibility, Abnormal gait, Impaired sensation, Increased edema, Decreased  balance, Difficulty walking  Visit Diagnosis: Pain in thoracic spine  Cervicalgia  Other  muscle spasm  Muscle weakness (generalized)  Abnormal posture  Pain in left foot     Problem List Patient Active Problem List   Diagnosis Date Noted  . DCIS (ductal carcinoma in situ) 01/26/2020  . Low back pain 01/26/2020  . Chronic pain of both knees 09/19/2019  . Osteopenia   . Estrogen deficiency 04/14/2018  . Essential hypertension 01/10/2018  . Gastroesophageal reflux disease 01/10/2018  . Mild intermittent asthma without complication 81/06/7508  . Pain in left lower leg 03/03/2017  . Family history of colon cancer 02/23/2017  . Herpes simplex vulvovaginitis 02/18/2017  . Neuropathy 02/18/2017  . Elevated liver enzymes 02/17/2017  . Need for Streptococcus pneumoniae vaccination 02/17/2017  . Chondromalacia of left patella 12/28/2016  . Synovitis of left knee 12/28/2016  . Seasonal allergic rhinitis due to fungal spores 09/25/2016  . Bone mass 09/24/2016  . Chronic hip pain 09/24/2016  . Dark yellow-colored urine 09/24/2016  . Low oxygen saturation 09/24/2016  . Other specified postprocedural states 01/17/2015  . Abnormal stress electrocardiogram test 10/20/2013  . Malignant neoplasm of female breast ([place]) 12/15/2010  . Breast mass 12/11/2010  . Family history of malignant neoplasm of breast 12/11/2010    Bess Harvest, PTA 05/20/20 1:00 PM   [facility] [address]  [place], [place], [address] Phone: [phone]   Fax:  [phone]  Name: Tonya [name] MRN: [id number] Date of Birth: [birth year]

## 2020-05-23 ENCOUNTER — Encounter: Payer: Self-pay | Admitting: Physical Therapy

## 2020-05-23 ENCOUNTER — Other Ambulatory Visit: Payer: Self-pay

## 2020-05-23 ENCOUNTER — Ambulatory Visit: Payer: Managed Care, Other (non HMO) | Attending: Family Medicine | Admitting: Physical Therapy

## 2020-05-23 DIAGNOSIS — M62838 Other muscle spasm: Secondary | ICD-10-CM | POA: Diagnosis present

## 2020-05-23 DIAGNOSIS — M79672 Pain in left foot: Secondary | ICD-10-CM

## 2020-05-23 DIAGNOSIS — M79662 Pain in left lower leg: Secondary | ICD-10-CM | POA: Diagnosis present

## 2020-05-23 DIAGNOSIS — R293 Abnormal posture: Secondary | ICD-10-CM | POA: Insufficient documentation

## 2020-05-23 DIAGNOSIS — M542 Cervicalgia: Secondary | ICD-10-CM | POA: Diagnosis present

## 2020-05-23 DIAGNOSIS — M6281 Muscle weakness (generalized): Secondary | ICD-10-CM | POA: Diagnosis present

## 2020-05-23 DIAGNOSIS — M546 Pain in thoracic spine: Secondary | ICD-10-CM

## 2020-05-23 NOTE — Therapy (Signed)
[facility] [address]  [place], [place], [address] Phone: [phone]   Fax:  [phone]  Physical Therapy Treatment  Patient Details  Name: Tonya [name] MRN: [id number] Date of Birth: Oct 30, 1950 Referring Provider (PT): Crosby Oyster [place], [place]   Encounter Date: 05/23/2020   PT End of Session - 05/23/20 1012    Visit Number 22    Number of Visits 28    Date for PT Re-Evaluation 06/10/20    Authorization Type [employer]    PT Start Time 0928    PT Stop Time 1012    PT Time Calculation (min) 44 min    Activity Tolerance Patient tolerated treatment well    Behavior During Therapy [facility] for tasks assessed/performed           Past Medical History:  Diagnosis Date  . Hypertension   . Osteopenia     Past Surgical History:  Procedure Laterality Date  . ABDOMINAL HYSTERECTOMY  2015  . AUGMENTATION MAMMAPLASTY Left   . BREAST BIOPSY Right 02/2019  . BREAST IMPLANT EXCHANGE    . MASTECTOMY Left   . REDUCTION MAMMAPLASTY Right   . TONSILLECTOMY AND ADENOIDECTOMY  1958    There were no vitals filed for this visit.   Subjective Assessment - 05/23/20 0929    Subjective "Everything has been better." However has been having more neck pain and some sinus issues. Feels that she tweaked the muscle in her midback when lifting her granddaughter- feels 98% better now. Scheduled for foot surgery.    Pertinent History osteopenia, HTN, L mastectomy    Diagnostic tests none recent    Patient Stated Goals improve pain    Currently in Pain? Yes    Pain Score 1     Pain Location Neck    Pain Orientation Right;Left    Pain Descriptors / Indicators Aching    Pain Type Chronic pain                             [facility] Adult PT Treatment/Exercise - 05/23/20 0001      Neck Exercises: Prone   Other Prone Exercise prone on elbows 10x3" to tolerance      Knee/Hip Exercises: Aerobic   Nustep L4 x 6 min  (UEs/LEs)      Manual Therapy   Manual Therapy Soft tissue mobilization;Myofascial release;Passive ROM    Manual therapy comments supine    Joint Mobilization thoracic central PA grade III/IV to tolerance    tender throughout; hypomobility most T4 and T9   Soft tissue mobilization STM to B UT, LS, scalenes    Myofascial Release manual TPR to B UT, LS, scalenes, suboccipital release   c/o tenderness in distal UT     Neck Exercises: Stretches   Upper Trapezius Stretch Right;Left;1 rep;30 seconds    Upper Trapezius Stretch Limitations strap assist    Other Neck Stretches R/L suboccipital stretch 30" each   gentle self-OP; L midback discomfort on L                   PT Short Term Goals - 05/13/20 1108      PT SHORT TERM GOAL #1   Title Patient to be independent with initial HEP.    Time 3    Period Weeks    Status Achieved    Target Date 03/13/20  PT Long Term Goals - 05/13/20 1108      PT LONG TERM GOAL #1   Title Patient to be independent with advanced HEP.    Time 6    Period Weeks    Status Partially Met   met for current     PT LONG TERM GOAL #2   Title Patient to demonstrate cervical AROM WFL and without pain limiting.    Time 6    Period Weeks    Status Partially Met   Cervical AROM has improved in flexion B sidebending, and R rotation     PT LONG TERM GOAL #3   Title Patient to demonstrate B shoulder strength >/=4+/5.    Time 6    Period Weeks    Status Achieved      PT LONG TERM GOAL #4   Title Patient to report 80% improvement in tolerance for cleaning chores.    Time 6    Period Weeks    Status Achieved   reports 100% resolution of pain     PT LONG TERM GOAL #5   Title Patient to report ability to lift her granddaughter without pain.    Time 6    Period Weeks    Status Achieved   met 05/13/20     PT LONG TERM GOAL #6   Title Patient to demonstrate thoracic AROM WFL and without pain limiting.    Time 6    Period Weeks     Status Partially Met   Thoracic AROM has improved in B sidebending and B rotation.     PT LONG TERM GOAL #7   Title Patient to demonstrate B LE strength >/=4+/5.    Time 6    Period Weeks    Status Partially Met   LE strength testing revealed improvement in hip flexion, R knee flexion/extension, R ankle strength in all planes, and L ankle dorsiflexion     PT LONG TERM GOAL #8   Title Patient to report tolerance for walking without limitations d/t L LE pain.    Time 6    Period Weeks    Status On-going   2-3 hours     PT LONG TERM GOAL  #9   TITLE Patient to report 80% improvement in L LE pain.    Time 6    Period Weeks    Status On-going   reporting 60-70% improvement in the L lower leg pain, and 25% improvement in L foot/heel pain, attributing some of this to hew new shoes                Plan - 05/23/20 1013    Clinical Impression Statement Patient reporting increased neck pain and sinus issues recently. Also notes that she tweaked her L midback yesterday when lifting her granddaughter, however noting 98% resolution with heat. Scheduled for L Morton's Neuroma surgery tomorrow, but reports that her L plantar fasciitis is 99% improved. Addressed patient's c/o neck pain today with STM, TPR, and suboccipital release. Patient reported most tenderness over the distal UT. Addressed patient's c/o of her neck feeling "locked up" with joint mobilizations- with patient decontaminating most hypomobility at T4 and T9, but report of tenderness throughout. Proceeded with gentle cervical stretching to continue encouraging increase muscle length. Patient reported improvement in stiffness at end of session.    Comorbidities osteopenia, HTN, L mastectomy    Rehab Potential Good    PT Frequency 2x / week    PT Duration 6 weeks  PT Treatment/Interventions ADLs/Self Care Home Management;Cryotherapy;Moist Heat;Therapeutic exercise;Therapeutic activities;Functional mobility training;Neuromuscular  re-education;Patient/family education;Manual techniques;Taping;Energy conservation;Dry needling;Passive range of motion;Electrical Stimulation;Balance training;Stair training;Gait training;Ultrasound;Vasopneumatic Device    PT Next Visit Plan progress thoracic joint mobility and ROM, calf stretching/strengthening    Consulted and Agree with Plan of Care Patient           Patient will benefit from skilled therapeutic intervention in order to improve the following deficits and impairments:  Hypomobility, Decreased scar mobility, Decreased activity tolerance, Decreased strength, Pain, Impaired UE functional use, Increased fascial restricitons, Increased muscle spasms, Improper body mechanics, Decreased range of motion, Postural dysfunction, Impaired flexibility, Abnormal gait, Impaired sensation, Increased edema, Decreased balance, Difficulty walking  Visit Diagnosis: Pain in thoracic spine  Cervicalgia  Other muscle spasm  Muscle weakness (generalized)  Abnormal posture  Pain in left foot  Pain in left lower leg     Problem List Patient Active Problem List   Diagnosis Date Noted  . DCIS (ductal carcinoma in situ) 01/26/2020  . Low back pain 01/26/2020  . Chronic pain of both knees 09/19/2019  . Osteopenia   . Estrogen deficiency 04/14/2018  . Essential hypertension 01/10/2018  . Gastroesophageal reflux disease 01/10/2018  . Mild intermittent asthma without complication 93/90/3009  . Pain in left lower leg 03/03/2017  . Family history of colon cancer 02/23/2017  . Herpes simplex vulvovaginitis 02/18/2017  . Neuropathy 02/18/2017  . Elevated liver enzymes 02/17/2017  . Need for Streptococcus pneumoniae vaccination 02/17/2017  . Chondromalacia of left patella 12/28/2016  . Synovitis of left knee 12/28/2016  . Seasonal allergic rhinitis due to fungal spores 09/25/2016  . Bone mass 09/24/2016  . Chronic hip pain 09/24/2016  . Dark yellow-colored urine 09/24/2016  . Low  oxygen saturation 09/24/2016  . Other specified postprocedural states 01/17/2015  . Abnormal stress electrocardiogram test 10/20/2013  . Malignant neoplasm of female breast ([place]) 12/15/2010  . Breast mass 12/11/2010  . Family history of malignant neoplasm of breast 12/11/2010     Janene Harvey, PT, DPT 05/23/20 10:14 AM   [facility] [address]  [place], [place], [address] Phone: [phone]   Fax:  [phone]  Name: Jatziri Goffredo MRN: [id number] Date of Birth: 1951/04/05

## 2020-05-24 ENCOUNTER — Other Ambulatory Visit: Payer: Self-pay | Admitting: Podiatry

## 2020-05-24 DIAGNOSIS — G5782 Other specified mononeuropathies of left lower limb: Secondary | ICD-10-CM

## 2020-05-24 DIAGNOSIS — G5762 Lesion of plantar nerve, left lower limb: Secondary | ICD-10-CM | POA: Diagnosis not present

## 2020-05-24 MED ORDER — GABAPENTIN 300 MG PO CAPS
300.0000 mg | ORAL_CAPSULE | Freq: Three times a day (TID) | ORAL | 0 refills | Status: DC
Start: 2020-05-24 — End: 2020-06-03

## 2020-05-24 MED ORDER — ACETAMINOPHEN 500 MG PO TABS: 1000.0000 mg | ORAL_TABLET | Freq: Four times a day (QID) | ORAL | 0 refills | Status: AC | PRN

## 2020-05-24 MED ORDER — IBUPROFEN 600 MG PO TABS
600.0000 mg | ORAL_TABLET | Freq: Four times a day (QID) | ORAL | 0 refills | Status: DC | PRN
Start: 2020-05-24 — End: 2020-06-03

## 2020-05-24 NOTE — Progress Notes (Unsigned)
12/3 [place] neurectomy left 2nd interspace

## 2020-05-25 HISTORY — PX: FOOT SURGERY: SHX648

## 2020-05-27 ENCOUNTER — Telehealth: Payer: Self-pay | Admitting: Podiatry

## 2020-05-27 NOTE — Telephone Encounter (Signed)
Thank you :)

## 2020-05-27 NOTE — Telephone Encounter (Signed)
Scheduled

## 2020-05-27 NOTE — Telephone Encounter (Signed)
Dr. Sherryle Lis did sx on this patient on Friday, she has dark blood around the incision near the edge but bright red blood in the middle 2 to 3 inches in diameter. Some please give her a call asap.

## 2020-05-27 NOTE — Telephone Encounter (Signed)
I spoke with her about this yesterday, why don't we put her in for 8:00 tomorrow to see me? She should ice and elevate until then, ice behind the calf and knee. Keep the ACE wrap on with good compression

## 2020-05-28 ENCOUNTER — Ambulatory Visit (INDEPENDENT_AMBULATORY_CARE_PROVIDER_SITE_OTHER): Payer: 59 | Admitting: Podiatry

## 2020-05-28 ENCOUNTER — Other Ambulatory Visit: Payer: Self-pay

## 2020-05-28 DIAGNOSIS — Z9889 Other specified postprocedural states: Secondary | ICD-10-CM

## 2020-05-28 DIAGNOSIS — G5782 Other specified mononeuropathies of left lower limb: Secondary | ICD-10-CM

## 2020-05-30 ENCOUNTER — Telehealth: Payer: Self-pay

## 2020-05-30 DIAGNOSIS — B379 Candidiasis, unspecified: Secondary | ICD-10-CM

## 2020-05-30 MED ORDER — FLUCONAZOLE 150 MG PO TABS: 150.0000 mg | ORAL_TABLET | Freq: Once | ORAL | 0 refills | Status: AC

## 2020-05-30 NOTE — Telephone Encounter (Signed)
Pt called stating she is having some milky, itchy discharge. Pt states she thinks it could be BV. Pt states she had foot surgery and is unable to come into the office until next week. A Rx for Diflucan was sent to the pt's pharmacy. Pt advised to come in next week to be seen. Understanding was voiced. Garek Schuneman l Tino Ronan, CMA

## 2020-05-31 ENCOUNTER — Encounter: Payer: Medicare Other | Admitting: Podiatry

## 2020-05-31 NOTE — Progress Notes (Signed)
  Subjective:  Patient ID: Tonya [name], female    DOB: 02/06/1951,  MRN: [id number]  Chief Complaint  Patient presents with  . Routine Post Op    PT noticed sunday that she was still bleeding from surgery and having alot of pain on the top of her foot    DOS: 05/24/2020 Procedure: Neurectomy of neuroma left second interspace  69 y.o. female returns for post-op check.  Spoke with her by phone on Sunday and was having some mild bleeding.  Encouraged to elevate ice and reinforced.  Yesterday this worsened and so I asked her to come in to see her incision and check the bandage to see if she has any excessive bleeding  Review of Systems: Negative except as noted in the HPI. Denies N/V/F/Ch.   Objective:  There were no vitals filed for this visit. There is no height or weight on file to calculate BMI. Constitutional Well developed. Well nourished.  Vascular Foot warm and well perfused. Capillary refill normal to all digits.   Neurologic Normal speech. Oriented to person, place, and time. Epicritic sensation to light touch grossly present bilaterally.  Dermatologic Skin healing well without signs of infection. Skin edges well coapted without signs of infection.  There is some white blood in the dressing but there is no active bleeding from the incision itself  Orthopedic: Tenderness to palpation noted about the surgical site.    Assessment:  No diagnosis found. Plan:  Patient was evaluated and treated and all questions answered.  S/p foot surgery left -Progressing as expected post-operatively. -WB Status: WBAT in surgical shoe -Sutures: Hopeful to remove on 12/17 at 2-week visit -Medications: No refills required -Foot redressed.  Return in about 10 days (around 06/07/2020) for suture removal POV #2 neurectomy left foot.

## 2020-06-03 ENCOUNTER — Encounter: Payer: Self-pay | Admitting: Podiatry

## 2020-06-03 ENCOUNTER — Telehealth: Payer: Self-pay | Admitting: Podiatry

## 2020-06-03 ENCOUNTER — Ambulatory Visit (INDEPENDENT_AMBULATORY_CARE_PROVIDER_SITE_OTHER): Payer: Managed Care, Other (non HMO) | Admitting: Family Medicine

## 2020-06-03 ENCOUNTER — Ambulatory Visit: Payer: Managed Care, Other (non HMO)

## 2020-06-03 ENCOUNTER — Encounter: Payer: Self-pay | Admitting: Family Medicine

## 2020-06-03 ENCOUNTER — Other Ambulatory Visit: Payer: Self-pay

## 2020-06-03 VITALS — BP 122/80 | HR 90 | Temp 97.9°F | Ht 67.0 in | Wt 223.2 lb

## 2020-06-03 DIAGNOSIS — H6981 Other specified disorders of Eustachian tube, right ear: Secondary | ICD-10-CM

## 2020-06-03 MED ORDER — PREDNISONE 20 MG PO TABS: 40.0000 mg | ORAL_TABLET | Freq: Every day | ORAL | 0 refills | Status: AC

## 2020-06-03 NOTE — Telephone Encounter (Signed)
Pt experiencing pain on top of surgical foot. She states it's sore and slightly swollen. She would like to know next steps, and when her stitches will be removed. Please advise.

## 2020-06-03 NOTE — Telephone Encounter (Signed)
Tonya [name] messaged me earlier about her, I can see her in the morning at 7:30 if she wants to evaluate it but I can't guarantee the sutures will be ready to come out by then

## 2020-06-03 NOTE — Progress Notes (Signed)
Chief Complaint  Patient presents with  . Ear Pain    Pt is here for right ear pain. Duration: 1 week Progression: unchanged Associated symptoms: Feels like she is going up in an airplane Denies: congestion, bleeding, or discharge from ear Treatment to date: None  Past Medical History:  Diagnosis Date  . Hypertension   . Osteopenia     BP 122/80 (BP Location: Left Arm, Patient Position: Sitting, Cuff Size: Normal)   Pulse 90   Temp 97.9 F (36.6 C) (Oral)   Ht 5\' 7"  (1.702 m)   Wt 223 lb 4 oz (101.3 kg)   SpO2 97%   BMI 34.97 kg/m  General: Awake, alert, appearing stated age HEENT:  L ear- Canal patent without drainage or erythema, TM is neg R ear- canal patent without drainage or erythema, TM is slightly retracted Nose- nares patent and without discharge Mouth- Lips, gums and dentition unremarkable, pharynx is without erythema or exudate Neck: No adenopathy Lungs: Normal effort, no accessory muscle use Psych: Age appropriate judgment and insight, normal mood and affect  ETD (Eustachian tube dysfunction), right - Plan: predniSONE (DELTASONE) 20 MG tablet  Does not do well with INCS. Classic ETD.  F/u prn.  Pt voiced understanding and agreement to the plan.  [place], DO 06/03/20 4:50 PM

## 2020-06-03 NOTE — Telephone Encounter (Signed)
I could see her at 7:30 tomorrow if she wants to come then but I can't guarantee the sutures will be ready to be removed yet. Lets leave her Friday appt as scheduled for now but I can evaluate the incision tomorrow if she wants.

## 2020-06-03 NOTE — Telephone Encounter (Signed)
Patient is concerned about redness and puffiness with surgery foot, where the stitches are, on top of foot. Patient has expressed she doesn't want to wait until Friday for follow up but nothing is available for patient. She has also requested for stitches to be removed, please advise

## 2020-06-03 NOTE — Patient Instructions (Addendum)
Consider Nasonex or Rhinocort in the future.   Let us know if you need anything.

## 2020-06-04 ENCOUNTER — Ambulatory Visit (INDEPENDENT_AMBULATORY_CARE_PROVIDER_SITE_OTHER): Payer: 59 | Admitting: Podiatry

## 2020-06-04 DIAGNOSIS — Z9889 Other specified postprocedural states: Secondary | ICD-10-CM

## 2020-06-04 DIAGNOSIS — G5782 Other specified mononeuropathies of left lower limb: Secondary | ICD-10-CM

## 2020-06-05 ENCOUNTER — Other Ambulatory Visit: Payer: Self-pay

## 2020-06-05 ENCOUNTER — Ambulatory Visit (INDEPENDENT_AMBULATORY_CARE_PROVIDER_SITE_OTHER): Payer: Managed Care, Other (non HMO) | Admitting: Family Medicine

## 2020-06-05 ENCOUNTER — Encounter: Payer: Self-pay | Admitting: Family Medicine

## 2020-06-05 ENCOUNTER — Other Ambulatory Visit (HOSPITAL_COMMUNITY)
Admission: RE | Admit: 2020-06-05 | Discharge: 2020-06-05 | Disposition: A | Discharge status: Home / Self Care | Payer: Managed Care, Other (non HMO) | Source: Ambulatory Visit | Attending: Family Medicine | Admitting: Family Medicine

## 2020-06-05 VITALS — BP 132/81 | HR 98 | Wt 222.0 lb

## 2020-06-05 DIAGNOSIS — N898 Other specified noninflammatory disorders of vagina: Secondary | ICD-10-CM | POA: Diagnosis not present

## 2020-06-05 NOTE — Progress Notes (Signed)
Patient states she thinks she is having another bacterial vaginosis infection. Patient states she took diflucan last Thursday/Friday. Kathrene Alu RN

## 2020-06-05 NOTE — Progress Notes (Signed)
   Subjective:    Patient ID: Tonya [name], female    DOB: 29-Jan-1951, 69 y.o.   MRN: [id number]  HPI  Seen for vaginal discharge. Noted increased discharge with odor. No irritation. Took diflucan last week.  Review of Systems     Objective:   Physical Exam Vitals reviewed. Exam conducted with a chaperone present.  Abdominal:     Hernia: There is no hernia in the left inguinal area or right inguinal area.  Genitourinary:    Labia:        Right: No rash, tenderness or lesion.        Left: No rash, tenderness or lesion.      Urethra: No prolapse.     Vagina: No signs of injury and foreign body. No vaginal discharge, erythema, tenderness or bleeding.  Lymphadenopathy:     Lower Body: No right inguinal adenopathy. No left inguinal adenopathy.       Assessment & Plan:  1. Vaginal discharge Will follow results. - Cervicovaginal ancillary only( [place])

## 2020-06-06 ENCOUNTER — Encounter: Payer: Self-pay | Admitting: Physical Therapy

## 2020-06-06 ENCOUNTER — Ambulatory Visit: Payer: Managed Care, Other (non HMO) | Admitting: Physical Therapy

## 2020-06-06 DIAGNOSIS — M79672 Pain in left foot: Secondary | ICD-10-CM

## 2020-06-06 DIAGNOSIS — M542 Cervicalgia: Secondary | ICD-10-CM

## 2020-06-06 DIAGNOSIS — R293 Abnormal posture: Secondary | ICD-10-CM

## 2020-06-06 DIAGNOSIS — M546 Pain in thoracic spine: Secondary | ICD-10-CM | POA: Diagnosis not present

## 2020-06-06 DIAGNOSIS — M62838 Other muscle spasm: Secondary | ICD-10-CM

## 2020-06-06 DIAGNOSIS — M79662 Pain in left lower leg: Secondary | ICD-10-CM

## 2020-06-06 DIAGNOSIS — M6281 Muscle weakness (generalized): Secondary | ICD-10-CM

## 2020-06-06 NOTE — Progress Notes (Signed)
  Subjective:  Patient ID: Tonya [name], female    DOB: 1950-08-18,  MRN: [id number]  Chief Complaint  Patient presents with  . Post-op Problem    Pt states she has discomfort wearing her surgical shoe. Pt also states stitches are uncomfortable and she wants them removed. Pt admits to rewrapping her foot about 1 week after surgery "I wanted clean wrappings".     DOS: 05/24/2020 Procedure: Neurectomy of neuroma left second interspace  69 y.o. female returns for post-op check.   Review of Systems: Negative except as noted in the HPI. Denies N/V/F/Ch.   Objective:  There were no vitals filed for this visit. There is no height or weight on file to calculate BMI. Constitutional Well developed. Well nourished.  Vascular Foot warm and well perfused. Capillary refill normal to all digits.   Neurologic Normal speech. Oriented to person, place, and time. Epicritic sensation to light touch grossly present bilaterally.  Dermatologic Skin healing well without signs of infection. Skin edges well coapted without signs of infection.  There is no warmth or erythema to the dorsum of the foot  Orthopedic: Tenderness to palpation noted about the surgical site.    Assessment:   1. Interdigital neuroma of left foot   2. Post-operative state    Plan:  Patient was evaluated and treated and all questions answered.  S/p foot surgery left -Progressing as expected post-operatively. -WB Status: WBAT in surgical shoe -I did allow the patient to shower and redress daily -Sutures: Not removed today but should be ready in a couple days. -Medications: No refills required -Foot redressed.  No follow-ups on file.

## 2020-06-06 NOTE — Therapy (Signed)
[facility] [address]  [place], [place], [address] Phone: [phone]   Fax:  [phone]  Physical Therapy Treatment  Patient Details  Name: Tonya [name] MRN: [id number] Date of Birth: 20-Jun-1951 Referring Provider (PT): Crosby Oyster [place], [place]   Encounter Date: 06/06/2020   PT End of Session - 06/06/20 1401    Visit Number 23    Number of Visits 28    Date for PT Re-Evaluation 06/10/20    Authorization Type [employer]    PT Start Time 6962    PT Stop Time 1359    PT Time Calculation (min) 42 min    Activity Tolerance Patient tolerated treatment well    Behavior During Therapy [facility] for tasks assessed/performed           Past Medical History:  Diagnosis Date   Hypertension    Osteopenia     Past Surgical History:  Procedure Laterality Date   ABDOMINAL HYSTERECTOMY  2015   AUGMENTATION MAMMAPLASTY Left    BREAST BIOPSY Right 02/2019   BREAST IMPLANT EXCHANGE     MASTECTOMY Left    REDUCTION MAMMAPLASTY Right    TONSILLECTOMY AND ADENOIDECTOMY  1958    There were no vitals filed for this visit.   Subjective Assessment - 06/06/20 1317    Subjective Reports that it has been 2 weeks since her surgery. Hopefully will get the stitches out tomorrow. D/t not moving as much from the surgery, the rest of her body has been hurting again. Denies any post-surgical precautions.    Pertinent History osteopenia, HTN, L mastectomy    Diagnostic tests none recent    Patient Stated Goals improve pain    Currently in Pain? Yes    Pain Score 6     Pain Location Neck    Pain Orientation Right;Left;Posterior    Pain Descriptors / Indicators Sore    Pain Type Chronic pain    Pain Score 6    Pain Location Back    Pain Orientation Mid    Pain Descriptors / Indicators Aching    Pain Type Chronic pain              [facility] PT Assessment - 06/06/20 0001      AROM   Left Ankle Dorsiflexion 10    Left  Ankle Plantar Flexion 28    Left Ankle Inversion 24    Left Ankle Eversion 11                         [facility] Adult PT Treatment/Exercise - 06/06/20 0001      Neck Exercises: Machines for Strengthening   Cybex Row 10x 20# low row, 10x 20# mid row   cues to avoid extending past torso     Neck Exercises: Seated   Other Seated Exercise R/L LS stretch with strap assist 30" each      Neck Exercises: Prone   Other Prone Exercise R/L prone over counter I and Y's with 1 # 10x   c/o mucle fatigue   Other Prone Exercise prone on elbows 10x3" to tolerance      Manual Therapy   Manual Therapy Soft tissue mobilization;Myofascial release;Passive ROM    Manual therapy comments prone    Joint Mobilization thoracic central PA grade III/IV to tolerance    most tenderness and hyomobility T4-T9   Soft tissue mobilization STM to R thoracic paraspinals, rhomboids, L LS  Myofascial Release manual TPR to R thoracic paraspinals, rhomboids, L LS   visible increased tone over R which improved after MT; tender trigger points throughout                   PT Short Term Goals - 05/13/20 1108      PT SHORT TERM GOAL #1   Title Patient to be independent with initial HEP.    Time 3    Period Weeks    Status Achieved    Target Date 03/13/20             PT Long Term Goals - 05/13/20 1108      PT LONG TERM GOAL #1   Title Patient to be independent with advanced HEP.    Time 6    Period Weeks    Status Partially Met   met for current     PT LONG TERM GOAL #2   Title Patient to demonstrate cervical AROM WFL and without pain limiting.    Time 6    Period Weeks    Status Partially Met   Cervical AROM has improved in flexion B sidebending, and R rotation     PT LONG TERM GOAL #3   Title Patient to demonstrate B shoulder strength >/=4+/5.    Time 6    Period Weeks    Status Achieved      PT LONG TERM GOAL #4   Title Patient to report 80% improvement in tolerance for  cleaning chores.    Time 6    Period Weeks    Status Achieved   reports 100% resolution of pain     PT LONG TERM GOAL #5   Title Patient to report ability to lift her granddaughter without pain.    Time 6    Period Weeks    Status Achieved   met 05/13/20     PT LONG TERM GOAL #6   Title Patient to demonstrate thoracic AROM WFL and without pain limiting.    Time 6    Period Weeks    Status Partially Met   Thoracic AROM has improved in B sidebending and B rotation.     PT LONG TERM GOAL #7   Title Patient to demonstrate B LE strength >/=4+/5.    Time 6    Period Weeks    Status Partially Met   LE strength testing revealed improvement in hip flexion, R knee flexion/extension, R ankle strength in all planes, and L ankle dorsiflexion     PT LONG TERM GOAL #8   Title Patient to report tolerance for walking without limitations d/t L LE pain.    Time 6    Period Weeks    Status On-going   2-3 hours     PT LONG TERM GOAL  #9   TITLE Patient to report 80% improvement in L LE pain.    Time 6    Period Weeks    Status On-going   reporting 60-70% improvement in the L lower leg pain, and 25% improvement in L foot/heel pain, attributing some of this to hew new shoes                Plan - 06/06/20 1401    Clinical Impression Statement Patient returns after L foot neurectomy on 05/24/20 and currently WBAT in a surgical shoe. L ankle AROM limited today, as expected but pain-free. Patient tolerated central PAs to thoracic spine with most tenderness and hypomobility T4-T9.  Patient demonstrated visible increased tone over R rhomboids and thoracic paraspinals, which improved after STM and manual TPR. Proceeded with thoracic mobility work and periscapular strengthening. Patient required minor cues to avoid extending shoulders past neutral with rows in order to avoid shoulder elevation. Patient without complaints at end of session. Patient progressing well towards goals despite recent flare s/p  foot surgery.    Comorbidities osteopenia, HTN, L mastectomy    Rehab Potential Good    PT Frequency 2x / week    PT Duration 6 weeks    PT Treatment/Interventions ADLs/Self Care Home Management;Cryotherapy;Moist Heat;Therapeutic exercise;Therapeutic activities;Functional mobility training;Neuromuscular re-education;Patient/family education;Manual techniques;Taping;Energy conservation;Dry needling;Passive range of motion;Electrical Stimulation;Balance training;Stair training;Gait training;Ultrasound;Vasopneumatic Device    PT Next Visit Plan progress thoracic joint mobility and ROM, calf stretching/strengthening    Consulted and Agree with Plan of Care Patient           Patient will benefit from skilled therapeutic intervention in order to improve the following deficits and impairments:  Hypomobility,Decreased scar mobility,Decreased activity tolerance,Decreased strength,Pain,Impaired UE functional use,Increased fascial restricitons,Increased muscle spasms,Improper body mechanics,Decreased range of motion,Postural dysfunction,Impaired flexibility,Abnormal gait,Impaired sensation,Increased edema,Decreased balance,Difficulty walking  Visit Diagnosis: Pain in thoracic spine  Cervicalgia  Other muscle spasm  Muscle weakness (generalized)  Abnormal posture  Pain in left foot  Pain in left lower leg     Problem List Patient Active Problem List   Diagnosis Date Noted   DCIS (ductal carcinoma in situ) 01/26/2020   Low back pain 01/26/2020   Chronic pain of both knees 09/19/2019   Osteopenia    Estrogen deficiency 04/14/2018   Essential hypertension 01/10/2018   Gastroesophageal reflux disease 01/10/2018   Mild intermittent asthma without complication 26/41/5830   Pain in left lower leg 03/03/2017   Family history of colon cancer 02/23/2017   Herpes simplex vulvovaginitis 02/18/2017   Neuropathy 02/18/2017   Elevated liver enzymes 02/17/2017   Need for  Streptococcus pneumoniae vaccination 02/17/2017   Chondromalacia of left patella 12/28/2016   Synovitis of left knee 12/28/2016   Seasonal allergic rhinitis due to fungal spores 09/25/2016   Bone mass 09/24/2016   Chronic hip pain 09/24/2016   Dark yellow-colored urine 09/24/2016   Low oxygen saturation 09/24/2016   Other specified postprocedural states 01/17/2015   Abnormal stress electrocardiogram test 10/20/2013   Malignant neoplasm of female breast ([place]) 12/15/2010   Breast mass 12/11/2010   Family history of malignant neoplasm of breast 12/11/2010     Janene Harvey, PT, DPT 06/06/20 2:02 PM   [facility] [address]  [place], [place], [address] Phone: [phone]   Fax:  [phone]  Name: Dezirae Service MRN: [id number] Date of Birth: 04-08-1951

## 2020-06-07 ENCOUNTER — Ambulatory Visit (INDEPENDENT_AMBULATORY_CARE_PROVIDER_SITE_OTHER): Payer: 59 | Admitting: Podiatry

## 2020-06-07 ENCOUNTER — Encounter: Payer: Medicare Other | Admitting: Podiatry

## 2020-06-07 ENCOUNTER — Other Ambulatory Visit: Payer: Self-pay

## 2020-06-07 DIAGNOSIS — G5782 Other specified mononeuropathies of left lower limb: Secondary | ICD-10-CM

## 2020-06-07 DIAGNOSIS — Z9889 Other specified postprocedural states: Secondary | ICD-10-CM

## 2020-06-09 NOTE — Progress Notes (Signed)
°  Subjective:  Patient ID: Tonya [name], female    DOB: 02/01/1951,  MRN: [id number]  Chief Complaint  Patient presents with   Routine Post Op    Pt stated that she is doing well. She is just ready for the stiches to come out. She has a little bit pain where the stiches are at.    DOS: 05/24/2020 Procedure: Neurectomy of neuroma left second interspace  69 y.o. female returns for post-op check. She saw Dr. March Rummage on Tuesday because she was concerned about the discomfort from the stitches in the amount of swelling she was having. A letter to begin bathing. The sutures are still there.  Review of Systems: Negative except as noted in the HPI. Denies N/V/F/Ch.   Objective:  There were no vitals filed for this visit. There is no height or weight on file to calculate BMI. Constitutional Well developed. Well nourished.  Vascular Foot warm and well perfused. Capillary refill normal to all digits.   Neurologic Normal speech. Oriented to person, place, and time. Epicritic sensation to light touch grossly present bilaterally.  Dermatologic Skin healing well without signs of infection. Skin edges well coapted without signs of infection. Sutures intact f  Orthopedic: Tenderness to palpation noted about the surgical site.    Assessment:   1. Interdigital neuroma of left foot   2. Post-operative state    Plan:  Patient was evaluated and treated and all questions answered.  S/p foot surgery left -Progressing as expected post-operatively. -WB Status: WBAT in surgical shoe. May return to regular shoes as tolerated -Sutures: Removed today with Steri-Strips applied -Continue bathing   No follow-ups on file.

## 2020-06-10 ENCOUNTER — Ambulatory Visit: Payer: Managed Care, Other (non HMO)

## 2020-06-10 ENCOUNTER — Other Ambulatory Visit: Payer: Self-pay

## 2020-06-10 ENCOUNTER — Telehealth: Payer: Self-pay

## 2020-06-10 DIAGNOSIS — M546 Pain in thoracic spine: Secondary | ICD-10-CM | POA: Diagnosis not present

## 2020-06-10 DIAGNOSIS — M542 Cervicalgia: Secondary | ICD-10-CM

## 2020-06-10 DIAGNOSIS — R293 Abnormal posture: Secondary | ICD-10-CM

## 2020-06-10 DIAGNOSIS — M79662 Pain in left lower leg: Secondary | ICD-10-CM

## 2020-06-10 DIAGNOSIS — M62838 Other muscle spasm: Secondary | ICD-10-CM

## 2020-06-10 DIAGNOSIS — M6281 Muscle weakness (generalized): Secondary | ICD-10-CM

## 2020-06-10 DIAGNOSIS — M79672 Pain in left foot: Secondary | ICD-10-CM

## 2020-06-10 LAB — CERVICOVAGINAL ANCILLARY ONLY
Bacterial Vaginitis (gardnerella): POSITIVE — AB
Candida Glabrata: NEGATIVE
Candida Vaginitis: NEGATIVE
Comment: NEGATIVE
Comment: NEGATIVE
Comment: NEGATIVE

## 2020-06-10 NOTE — Therapy (Signed)
[facility] [address]  [place], [place], [address] Phone: [phone]   Fax:  [phone]  Physical Therapy Treatment  Patient Details  Name: Tonya [name] MRN: [id number] Date of Birth: 08/05/1950 Referring Provider (PT): Crosby Oyster [place], [place]   Encounter Date: 06/10/2020   PT End of Session - 06/10/20 1413    Visit Number 24    Number of Visits 28    Date for PT Re-Evaluation 06/10/20    Authorization Type [employer]    PT Start Time 1408   Pt. arrived late   PT Stop Time 1451    PT Time Calculation (min) 43 min    Activity Tolerance Patient tolerated treatment well    Behavior During Therapy WFL for tasks assessed/performed           Past Medical History:  Diagnosis Date  . Hypertension   . Osteopenia     Past Surgical History:  Procedure Laterality Date  . ABDOMINAL HYSTERECTOMY  2015  . AUGMENTATION MAMMAPLASTY Left   . BREAST BIOPSY Right 02/2019  . BREAST IMPLANT EXCHANGE    . MASTECTOMY Left   . REDUCTION MAMMAPLASTY Right   . TONSILLECTOMY AND ADENOIDECTOMY  1958    There were no vitals filed for this visit.   Subjective Assessment - 06/10/20 1416    Subjective Pt. reporting foot incision healing well.    Pertinent History osteopenia, HTN, L mastectomy    Diagnostic tests none recent    Patient Stated Goals improve pain    Currently in Pain? Yes    Pain Score 1    .5/10   Pain Location Neck    Pain Orientation Right;Left;Posterior    Pain Descriptors / Indicators Sore    Pain Type Chronic pain    Pain Frequency Intermittent    Multiple Pain Sites No    Pain Score 2    Pain Location Back    Pain Orientation Mid    Pain Descriptors / Indicators Dull;Aching    Pain Type Chronic pain    Pain Frequency Intermittent    Aggravating Factors  more sitting due to foot surgery                             [facility] Adult PT Treatment/Exercise - 06/10/20 0001       Neck Exercises: Machines for Strengthening   UBE (Upper Arm Bike) L2.5 x 3 min forward/3 min back    Cybex Row 10x 20# low row, 10x 20# mid row      Lumbar Exercises: Stretches   Lower Trunk Rotation Limitations 5" x 10      Lumbar Exercises: Sidelying   Other Sidelying Lumbar Exercises B "open book" stretch 10 x 5" hold      Moist Heat Therapy   Number Minutes Moist Heat 10 Minutes    Moist Heat Location Lumbar Spine   and mid back     Manual Therapy   Manual Therapy Soft tissue mobilization    Manual therapy comments prone    Soft tissue mobilization B lumbar/thoracic paraspinals, cervical paraspinals      Neck Exercises: Stretches   Upper Trapezius Stretch Right;Left;1 rep;30 seconds    Upper Trapezius Stretch Limitations hands on seat    Levator Stretch Left;Right;1 rep;30 seconds    Levator Stretch Limitations B    Other Neck Stretches B rhomboids 2 x 20 sec  PT Short Term Goals - 05/13/20 1108      PT SHORT TERM GOAL #1   Title Patient to be independent with initial HEP.    Time 3    Period Weeks    Status Achieved    Target Date 03/13/20             PT Long Term Goals - 05/13/20 1108      PT LONG TERM GOAL #1   Title Patient to be independent with advanced HEP.    Time 6    Period Weeks    Status Partially Met   met for current     PT LONG TERM GOAL #2   Title Patient to demonstrate cervical AROM WFL and without pain limiting.    Time 6    Period Weeks    Status Partially Met   Cervical AROM has improved in flexion B sidebending, and R rotation     PT LONG TERM GOAL #3   Title Patient to demonstrate B shoulder strength >/=4+/5.    Time 6    Period Weeks    Status Achieved      PT LONG TERM GOAL #4   Title Patient to report 80% improvement in tolerance for cleaning chores.    Time 6    Period Weeks    Status Achieved   reports 100% resolution of pain     PT LONG TERM GOAL #5   Title Patient to report ability to  lift her granddaughter without pain.    Time 6    Period Weeks    Status Achieved   met 05/13/20     PT LONG TERM GOAL #6   Title Patient to demonstrate thoracic AROM WFL and without pain limiting.    Time 6    Period Weeks    Status Partially Met   Thoracic AROM has improved in B sidebending and B rotation.     PT LONG TERM GOAL #7   Title Patient to demonstrate B LE strength >/=4+/5.    Time 6    Period Weeks    Status Partially Met   LE strength testing revealed improvement in hip flexion, R knee flexion/extension, R ankle strength in all planes, and L ankle dorsiflexion     PT LONG TERM GOAL #8   Title Patient to report tolerance for walking without limitations d/t L LE pain.    Time 6    Period Weeks    Status On-going   2-3 hours     PT LONG TERM GOAL  #9   TITLE Patient to report 80% improvement in L LE pain.    Time 6    Period Weeks    Status On-going   reporting 60-70% improvement in the L lower leg pain, and 25% improvement in L foot/heel pain, attributing some of this to hew new shoes                Plan - 06/10/20 1419    Clinical Impression Statement Tonya [name] arrived late to session thus tx time limited.  With complaint of midline cervical/thoracic pain today and partially attributes this to be less active since having her foot surgery.  Progressed periscapular strengthening with postural support without increased pain.  Progressed thoracic ROM activities with pt. verbalizing appropriate stretch in "muscular" tightness in area of mid back complaint.  Ended visit with trial of moist heat to thoracic/lumbar/cervical spine for pain relief and relaxation of musculature with god relief noted.  Comorbidities osteopenia, HTN, L mastectomy    Rehab Potential Good    PT Frequency 2x / week    PT Duration 6 weeks    PT Treatment/Interventions ADLs/Self Care Home Management;Cryotherapy;Moist Heat;Therapeutic exercise;Therapeutic activities;Functional mobility  training;Neuromuscular re-education;Patient/family education;Manual techniques;Taping;Energy conservation;Dry needling;Passive range of motion;Electrical Stimulation;Balance training;Stair training;Gait training;Ultrasound;Vasopneumatic Device    PT Next Visit Plan progress thoracic joint mobility and ROM, calf stretching/strengthening    Consulted and Agree with Plan of Care Patient           Patient will benefit from skilled therapeutic intervention in order to improve the following deficits and impairments:  Hypomobility,Decreased scar mobility,Decreased activity tolerance,Decreased strength,Pain,Impaired UE functional use,Increased fascial restricitons,Increased muscle spasms,Improper body mechanics,Decreased range of motion,Postural dysfunction,Impaired flexibility,Abnormal gait,Impaired sensation,Increased edema,Decreased balance,Difficulty walking  Visit Diagnosis: Pain in thoracic spine  Cervicalgia  Other muscle spasm  Muscle weakness (generalized)  Abnormal posture  Pain in left foot  Pain in left lower leg     Problem List Patient Active Problem List   Diagnosis Date Noted  . DCIS (ductal carcinoma in situ) 01/26/2020  . Low back pain 01/26/2020  . Chronic pain of both knees 09/19/2019  . Osteopenia   . Estrogen deficiency 04/14/2018  . Essential hypertension 01/10/2018  . Gastroesophageal reflux disease 01/10/2018  . Mild intermittent asthma without complication 81/03/3158  . Pain in left lower leg 03/03/2017  . Family history of colon cancer 02/23/2017  . Herpes simplex vulvovaginitis 02/18/2017  . Neuropathy 02/18/2017  . Elevated liver enzymes 02/17/2017  . Need for Streptococcus pneumoniae vaccination 02/17/2017  . Chondromalacia of left patella 12/28/2016  . Synovitis of left knee 12/28/2016  . Seasonal allergic rhinitis due to fungal spores 09/25/2016  . Bone mass 09/24/2016  . Chronic hip pain 09/24/2016  . Dark yellow-colored urine 09/24/2016  .  Low oxygen saturation 09/24/2016  . Other specified postprocedural states 01/17/2015  . Abnormal stress electrocardiogram test 10/20/2013  . Malignant neoplasm of female breast ([place]) 12/15/2010  . Breast mass 12/11/2010  . Family history of malignant neoplasm of breast 12/11/2010    Bess Harvest, PTA 06/10/20 5:59 PM    [facility] [address]  [place], [place], [address] Phone: [phone]   Fax:  [phone]  Name: Tonya [name] MRN: [id number] Date of Birth: 11-13-1950

## 2020-06-10 NOTE — Telephone Encounter (Signed)
Patient called and saw her BV+ result on My chart.   Patient made aware I can call in Flagyl and she states she doesn't want that. Patient would like the provider to call in the boric acid suppositories. Will route message to provider. Kathrene Alu RN

## 2020-06-11 MED ORDER — METRONIDAZOLE 0.75 % VA GEL
1.0000 | Freq: Every day | VAGINAL | 1 refills | Status: DC
Start: 2020-06-11 — End: 2020-12-25

## 2020-06-11 NOTE — Addendum Note (Signed)
Addended by: Truett Mainland on: 06/11/2020 09:37 AM   Modules accepted: Orders

## 2020-06-17 ENCOUNTER — Ambulatory Visit: Payer: Managed Care, Other (non HMO)

## 2020-06-19 ENCOUNTER — Encounter: Payer: Medicare Other | Admitting: Podiatry

## 2020-06-19 ENCOUNTER — Ambulatory Visit: Payer: Managed Care, Other (non HMO)

## 2020-06-19 ENCOUNTER — Other Ambulatory Visit: Payer: Self-pay

## 2020-06-19 DIAGNOSIS — M79672 Pain in left foot: Secondary | ICD-10-CM

## 2020-06-19 DIAGNOSIS — M79662 Pain in left lower leg: Secondary | ICD-10-CM

## 2020-06-19 DIAGNOSIS — M546 Pain in thoracic spine: Secondary | ICD-10-CM

## 2020-06-19 DIAGNOSIS — M542 Cervicalgia: Secondary | ICD-10-CM

## 2020-06-19 DIAGNOSIS — M62838 Other muscle spasm: Secondary | ICD-10-CM

## 2020-06-19 DIAGNOSIS — R293 Abnormal posture: Secondary | ICD-10-CM

## 2020-06-19 DIAGNOSIS — M6281 Muscle weakness (generalized): Secondary | ICD-10-CM

## 2020-06-19 NOTE — Therapy (Signed)
[facility] [address]  [place], [place], [address] Phone: [phone]   Fax:  [phone]  Physical Therapy Treatment  Patient Details  Name: Tonya [name] MRN: [id number] Date of Birth: December 12, 1950 Referring Provider (PT): Crosby Oyster [place], [place]   Encounter Date: 06/19/2020   PT End of Session - 06/19/20 0812    Visit Number 25    Number of Visits 28    Date for PT Re-Evaluation 06/10/20    Authorization Type [employer]    PT Start Time 0806    PT Stop Time 0902    PT Time Calculation (min) 56 min    Activity Tolerance Patient tolerated treatment well    Behavior During Therapy [facility] for tasks assessed/performed           Past Medical History:  Diagnosis Date  . Hypertension   . Osteopenia     Past Surgical History:  Procedure Laterality Date  . ABDOMINAL HYSTERECTOMY  2015  . AUGMENTATION MAMMAPLASTY Left   . BREAST BIOPSY Right 02/2019  . BREAST IMPLANT EXCHANGE    . MASTECTOMY Left   . REDUCTION MAMMAPLASTY Right   . TONSILLECTOMY AND ADENOIDECTOMY  1958    There were no vitals filed for this visit.   Subjective Assessment - 06/19/20 0809    Subjective Pt. noting R shoulder pain is her primary complaint.    Pertinent History osteopenia, HTN, L mastectomy    Diagnostic tests none recent    Patient Stated Goals improve pain    Currently in Pain? Yes    Pain Score 3     Pain Location Shoulder    Pain Orientation Right;Upper    Pain Descriptors / Indicators Aching    Pain Type Chronic pain    Pain Onset More than a month ago    Pain Frequency Intermittent    Aggravating Factors  prolonged driving    Pain Relieving Factors heat    Multiple Pain Sites No                             [facility] Adult PT Treatment/Exercise - 06/19/20 0001      Neck Exercises: Machines for Strengthening   UBE (Upper Arm Bike) L2.5 x 3 min forward/3 min back    Cybex Row 15 x 20# low row       Shoulder Exercises: ROM/Strengthening   UBE (Upper Arm Bike) --      Moist Heat Therapy   Number Minutes Moist Heat 15 Minutes    Moist Heat Location Lumbar Spine   cervial, thoracic, shoulder     Manual Therapy   Manual Therapy Soft tissue mobilization;Myofascial release    Manual therapy comments sitting    Soft tissue mobilization B STM/DTM to R UT, LS, cervical paraspinals, rhomboids, posterior RTC    Myofascial Release TPR to R  infra      Neck Exercises: Stretches   Upper Trapezius Stretch Right;Left;1 rep;30 seconds    Upper Trapezius Stretch Limitations hands on seat    Levator Stretch Left;Right;1 rep;30 seconds    Levator Stretch Limitations B    Other Neck Stretches B scalenes stretch x 30 each                    PT Short Term Goals - 05/13/20 1108      PT SHORT TERM GOAL #1   Title Patient to be independent  with initial HEP.    Time 3    Period Weeks    Status Achieved    Target Date 03/13/20             PT Long Term Goals - 05/13/20 1108      PT LONG TERM GOAL #1   Title Patient to be independent with advanced HEP.    Time 6    Period Weeks    Status Partially Met   met for current     PT LONG TERM GOAL #2   Title Patient to demonstrate cervical AROM WFL and without pain limiting.    Time 6    Period Weeks    Status Partially Met   Cervical AROM has improved in flexion B sidebending, and R rotation     PT LONG TERM GOAL #3   Title Patient to demonstrate B shoulder strength >/=4+/5.    Time 6    Period Weeks    Status Achieved      PT LONG TERM GOAL #4   Title Patient to report 80% improvement in tolerance for cleaning chores.    Time 6    Period Weeks    Status Achieved   reports 100% resolution of pain     PT LONG TERM GOAL #5   Title Patient to report ability to lift her granddaughter without pain.    Time 6    Period Weeks    Status Achieved   met 05/13/20     PT LONG TERM GOAL #6   Title Patient to demonstrate thoracic  AROM WFL and without pain limiting.    Time 6    Period Weeks    Status Partially Met   Thoracic AROM has improved in B sidebending and B rotation.     PT LONG TERM GOAL #7   Title Patient to demonstrate B LE strength >/=4+/5.    Time 6    Period Weeks    Status Partially Met   LE strength testing revealed improvement in hip flexion, R knee flexion/extension, R ankle strength in all planes, and L ankle dorsiflexion     PT LONG TERM GOAL #8   Title Patient to report tolerance for walking without limitations d/t L LE pain.    Time 6    Period Weeks    Status On-going   2-3 hours     PT LONG TERM GOAL  #9   TITLE Patient to report 80% improvement in L LE pain.    Time 6    Period Weeks    Status On-going   reporting 60-70% improvement in the L lower leg pain, and 25% improvement in L foot/heel pain, attributing some of this to hew new shoes                Plan - 06/19/20 1206    Clinical Impression Statement Tonya [name] reporting she has had R-sided neck pain/upper shoulder pain since sleeping in car for 45 min on long trip over holiday.  MT addressed increased tension and visibly elevated R shoulder posture with reduction in tone and pain following.  Progressed scapular strengthening with mild increased in R shoulder pain which subsided with moist heat to end session.  Encouraged pt. to continue utilizing moist heat and stretching throughout day until next PT session for improved comfort and reduce cervical tension.    Comorbidities osteopenia, HTN, L mastectomy    Rehab Potential Good    PT Frequency 2x / week  PT Duration 6 weeks    PT Treatment/Interventions ADLs/Self Care Home Management;Cryotherapy;Moist Heat;Therapeutic exercise;Therapeutic activities;Functional mobility training;Neuromuscular re-education;Patient/family education;Manual techniques;Taping;Energy conservation;Dry needling;Passive range of motion;Electrical Stimulation;Balance training;Stair training;Gait  training;Ultrasound;Vasopneumatic Device    PT Next Visit Plan progress thoracic joint mobility and ROM, calf stretching/strengthening    Consulted and Agree with Plan of Care Patient           Patient will benefit from skilled therapeutic intervention in order to improve the following deficits and impairments:  Hypomobility,Decreased scar mobility,Decreased activity tolerance,Decreased strength,Pain,Impaired UE functional use,Increased fascial restricitons,Increased muscle spasms,Improper body mechanics,Decreased range of motion,Postural dysfunction,Impaired flexibility,Abnormal gait,Impaired sensation,Increased edema,Decreased balance,Difficulty walking  Visit Diagnosis: Pain in thoracic spine  Cervicalgia  Other muscle spasm  Muscle weakness (generalized)  Abnormal posture  Pain in left foot  Pain in left lower leg     Problem List Patient Active Problem List   Diagnosis Date Noted  . DCIS (ductal carcinoma in situ) 01/26/2020  . Low back pain 01/26/2020  . Chronic pain of both knees 09/19/2019  . Osteopenia   . Estrogen deficiency 04/14/2018  . Essential hypertension 01/10/2018  . Gastroesophageal reflux disease 01/10/2018  . Mild intermittent asthma without complication 08/67/6195  . Pain in left lower leg 03/03/2017  . Family history of colon cancer 02/23/2017  . Herpes simplex vulvovaginitis 02/18/2017  . Neuropathy 02/18/2017  . Elevated liver enzymes 02/17/2017  . Need for Streptococcus pneumoniae vaccination 02/17/2017  . Chondromalacia of left patella 12/28/2016  . Synovitis of left knee 12/28/2016  . Seasonal allergic rhinitis due to fungal spores 09/25/2016  . Bone mass 09/24/2016  . Chronic hip pain 09/24/2016  . Dark yellow-colored urine 09/24/2016  . Low oxygen saturation 09/24/2016  . Other specified postprocedural states 01/17/2015  . Abnormal stress electrocardiogram test 10/20/2013  . Malignant neoplasm of female breast ([place]) 12/15/2010  .  Breast mass 12/11/2010  . Family history of malignant neoplasm of breast 12/11/2010    Bess Harvest, PTA 06/19/20 12:08 PM   [facility] [address]  [place], [place], [address] Phone: [phone]   Fax:  [phone]  Name: Tonya [name] MRN: [id number] Date of Birth: January 26, 1951

## 2020-06-20 ENCOUNTER — Ambulatory Visit: Payer: Managed Care, Other (non HMO)

## 2020-06-20 ENCOUNTER — Ambulatory Visit (INDEPENDENT_AMBULATORY_CARE_PROVIDER_SITE_OTHER): Payer: 59 | Admitting: Podiatry

## 2020-06-20 DIAGNOSIS — M6281 Muscle weakness (generalized): Secondary | ICD-10-CM

## 2020-06-20 DIAGNOSIS — R293 Abnormal posture: Secondary | ICD-10-CM

## 2020-06-20 DIAGNOSIS — M79672 Pain in left foot: Secondary | ICD-10-CM

## 2020-06-20 DIAGNOSIS — M546 Pain in thoracic spine: Secondary | ICD-10-CM | POA: Diagnosis not present

## 2020-06-20 DIAGNOSIS — G5782 Other specified mononeuropathies of left lower limb: Secondary | ICD-10-CM

## 2020-06-20 DIAGNOSIS — M79662 Pain in left lower leg: Secondary | ICD-10-CM

## 2020-06-20 DIAGNOSIS — M62838 Other muscle spasm: Secondary | ICD-10-CM

## 2020-06-20 DIAGNOSIS — Z9889 Other specified postprocedural states: Secondary | ICD-10-CM

## 2020-06-20 DIAGNOSIS — M542 Cervicalgia: Secondary | ICD-10-CM

## 2020-06-20 NOTE — Patient Instructions (Signed)
Look for Mederma silicone scar ointment at the pharmacy or on [employer]

## 2020-06-20 NOTE — Therapy (Addendum)
[facility] [address]  [place], [place], [address] Phone: [phone]   Fax:  [phone]  Physical Therapy Treatment  Patient Details  Name: Tonya [name] MRN: [id number] Date of Birth: Jun 27, 1950 Referring Provider (PT): Crosby Oyster [place], [place]   Encounter Date: 06/20/2020   PT End of Session - 06/20/20 0858    Visit Number 26    Number of Visits 28    Date for PT Re-Evaluation 06/10/20    Authorization Type [employer]    PT Start Time 6811   Pt. arrived late   PT Stop Time 0930    PT Time Calculation (min) 35 min    Activity Tolerance Patient tolerated treatment well    Behavior During Therapy WFL for tasks assessed/performed           Past Medical History:  Diagnosis Date  . Hypertension   . Osteopenia     Past Surgical History:  Procedure Laterality Date  . ABDOMINAL HYSTERECTOMY  2015  . AUGMENTATION MAMMAPLASTY Left   . BREAST BIOPSY Right 02/2019  . BREAST IMPLANT EXCHANGE    . MASTECTOMY Left   . REDUCTION MAMMAPLASTY Right   . TONSILLECTOMY AND ADENOIDECTOMY  1958    There were no vitals filed for this visit.   Subjective Assessment - 06/20/20 1204    Subjective Had some increased pain yesterday which has improved somewhat.    Pertinent History osteopenia, HTN, L mastectomy    Diagnostic tests none recent    Patient Stated Goals improve pain    Currently in Pain? No/denies    Pain Score 0-No pain    Pain Location Shoulder    Pain Orientation Right;Upper    Pain Descriptors / Indicators Aching    Pain Type Chronic pain                             [facility] Adult PT Treatment/Exercise - 06/20/20 0001      Neck Exercises: Machines for Strengthening   Nustep Lvl 5, 6 min (UE/LE)      Shoulder Exercises: Standing   Extension Both;10 reps;Strengthening;Theraband    Theraband Level (Shoulder Extension) Level 2 (Red)    Row Both;10 reps;Theraband;Strengthening     Theraband Level (Shoulder Row) Level 2 (Red)      Shoulder Exercises: Stretch   Corner Stretch 2 reps;30 seconds    Corner Stretch Limitations low and 90/90 doorway      Moist Heat Therapy   Number Minutes Moist Heat 15 Minutes    Moist Heat Location --   upper thoracic, cervical     Neck Exercises: Stretches   Upper Trapezius Stretch Right;Left;1 rep;30 seconds    Upper Trapezius Stretch Limitations hands on seat    Levator Stretch Left;Right;1 rep;30 seconds    Other Neck Stretches B scalenes stretch x 30 each    Other Neck Stretches B rhomboids 2 x 20 sec                    PT Short Term Goals - 05/13/20 1108      PT SHORT TERM GOAL #1   Title Patient to be independent with initial HEP.    Time 3    Period Weeks    Status Achieved    Target Date 03/13/20             PT Long Term Goals - 05/13/20 1108  PT LONG TERM GOAL #1   Title Patient to be independent with advanced HEP.    Time 6    Period Weeks    Status Partially Met   met for current     PT LONG TERM GOAL #2   Title Patient to demonstrate cervical AROM WFL and without pain limiting.    Time 6    Period Weeks    Status Partially Met   Cervical AROM has improved in flexion B sidebending, and R rotation     PT LONG TERM GOAL #3   Title Patient to demonstrate B shoulder strength >/=4+/5.    Time 6    Period Weeks    Status Achieved      PT LONG TERM GOAL #4   Title Patient to report 80% improvement in tolerance for cleaning chores.    Time 6    Period Weeks    Status Achieved   reports 100% resolution of pain     PT LONG TERM GOAL #5   Title Patient to report ability to lift her granddaughter without pain.    Time 6    Period Weeks    Status Achieved   met 05/13/20     PT LONG TERM GOAL #6   Title Patient to demonstrate thoracic AROM WFL and without pain limiting.    Time 6    Period Weeks    Status Partially Met   Thoracic AROM has improved in B sidebending and B rotation.      PT LONG TERM GOAL #7   Title Patient to demonstrate B LE strength >/=4+/5.    Time 6    Period Weeks    Status Partially Met   LE strength testing revealed improvement in hip flexion, R knee flexion/extension, R ankle strength in all planes, and L ankle dorsiflexion     PT LONG TERM GOAL #8   Title Patient to report tolerance for walking without limitations d/t L LE pain.    Time 6    Period Weeks    Status On-going   2-3 hours     PT LONG TERM GOAL  #9   TITLE Patient to report 80% improvement in L LE pain.    Time 6    Period Weeks    Status On-going   reporting 60-70% improvement in the L lower leg pain, and 25% improvement in L foot/heel pain, attributing some of this to Tonya [name]                Plan - 06/20/20 0859    Clinical Impression Statement Tonya [name] doing ok.  Notes increased pain yesterday with some improvement today.  Arrived late to session thus treatment time limited.  Progressed cervical/rhomboids stretching along with gentle scapular strengthening.  Ended visit without increased pain.  Sees Podiatrist later today for f/u.    Comorbidities osteopenia, HTN, L mastectomy    Rehab Potential Good    PT Frequency 2x / week    PT Duration 6 weeks    PT Treatment/Interventions ADLs/Self Care Home Management;Cryotherapy;Moist Heat;Therapeutic exercise;Therapeutic activities;Functional mobility training;Neuromuscular re-education;Patient/family education;Manual techniques;Taping;Energy conservation;Dry needling;Passive range of motion;Electrical Stimulation;Balance training;Stair training;Gait training;Ultrasound;Vasopneumatic Device    PT Next Visit Plan progress thoracic joint mobility and ROM, calf stretching/strengthening    Consulted and Agree with Plan of Care Patient           Patient will benefit from skilled therapeutic intervention in order to improve the following deficits and impairments:  Hypomobility,Decreased  scar mobility,Decreased activity  tolerance,Decreased strength,Pain,Impaired UE functional use,Increased fascial restricitons,Increased muscle spasms,Improper body mechanics,Decreased range of motion,Postural dysfunction,Impaired flexibility,Abnormal gait,Impaired sensation,Increased edema,Decreased balance,Difficulty walking  Visit Diagnosis: Pain in thoracic spine  Cervicalgia  Other muscle spasm  Muscle weakness (generalized)  Abnormal posture  Pain in left foot  Pain in left lower leg     Problem List Patient Active Problem List   Diagnosis Date Noted  . DCIS (ductal carcinoma in situ) 01/26/2020  . Low back pain 01/26/2020  . Chronic pain of both knees 09/19/2019  . Osteopenia   . Estrogen deficiency 04/14/2018  . Essential hypertension 01/10/2018  . Gastroesophageal reflux disease 01/10/2018  . Mild intermittent asthma without complication 81/06/7508  . Pain in left lower leg 03/03/2017  . Family history of colon cancer 02/23/2017  . Herpes simplex vulvovaginitis 02/18/2017  . Neuropathy 02/18/2017  . Elevated liver enzymes 02/17/2017  . Need for Streptococcus pneumoniae vaccination 02/17/2017  . Chondromalacia of left patella 12/28/2016  . Synovitis of left knee 12/28/2016  . Seasonal allergic rhinitis due to fungal spores 09/25/2016  . Bone mass 09/24/2016  . Chronic hip pain 09/24/2016  . Dark yellow-colored urine 09/24/2016  . Low oxygen saturation 09/24/2016  . Other specified postprocedural states 01/17/2015  . Abnormal stress electrocardiogram test 10/20/2013  . Malignant neoplasm of female breast ([place]) 12/15/2010  . Breast mass 12/11/2010  . Family history of malignant neoplasm of breast 12/11/2010    Bess Harvest, PTA 06/20/20 12:08 PM   [facility] [address]  [place], [place], [address] Phone: [phone]   Fax:  [phone]  Name: Tonya [name] MRN: [id number] Date of Birth: 11-06-1950   PHYSICAL THERAPY  DISCHARGE SUMMARY  Visits from Start of Care: 26  Current functional level related to goals / functional outcomes: Unable to assess; patient requested to cx all appointments d/t cost   Remaining deficits: Unable to assess   Education / Equipment: HEP  Plan: Patient agrees to discharge.  Patient goals were partially met. Patient is being discharged due to financial reasons.  ?????     Janene Harvey, PT, DPT 07/12/20 9:00 AM

## 2020-06-21 NOTE — Progress Notes (Signed)
  Subjective:  Patient ID: Tonya [name], female    DOB: 24-Oct-1950,  MRN: [id number]  Chief Complaint  Patient presents with  . Neuroma    PT stated that she is doing well she does have some soreness in her foot still but the pain comes and goes.    DOS: 05/24/2020 Procedure: Neurectomy of neuroma left second interspace  69 y.o. female returns for post-op check.  She is doing well.  Having some pain in the second toe on the outside.  Some tenderness over the incision.  She has been Neosporin on it  Review of Systems: Negative except as noted in the HPI. Denies N/V/F/Ch.   Objective:  There were no vitals filed for this visit. There is no height or weight on file to calculate BMI. Constitutional Well developed. Well nourished.  Vascular Foot warm and well perfused. Capillary refill normal to all digits.   Neurologic Normal speech. Oriented to person, place, and time. Epicritic sensation to light touch grossly present bilaterally.  Dermatologic  incision is mostly healed with some fissuring in the central portion, no wound dehiscence noted.  No signs of infection.  Orthopedic: Tenderness to palpation noted about the surgical site.  She has some pain in the lateral second toe    Assessment:   1. Interdigital neuroma of left foot   2. Post-operative state    Plan:  Patient was evaluated and treated and all questions answered.  S/p foot surgery left -Progressing as expected post-operatively. -Discussed the pain she is having in the second toe, this could be just postoperative pain or the distal end of the nerve which I think will improve and resolve as the nerve degenerates.  No pain plantarly where the neuroma was.  No pain within the interspace itself. -Continue regular activity and shoe gear.  Moving will be good for her to prevent any scar tissue formation -Recommend she is a silicone gel such as Mederma for the incision   Return in about 6 weeks (around 08/01/2020).

## 2020-06-24 ENCOUNTER — Ambulatory Visit: Payer: Managed Care, Other (non HMO)

## 2020-06-26 ENCOUNTER — Ambulatory Visit: Payer: Managed Care, Other (non HMO) | Admitting: Physical Therapy

## 2020-07-12 ENCOUNTER — Other Ambulatory Visit: Payer: Self-pay | Admitting: Family Medicine

## 2020-07-25 ENCOUNTER — Encounter: Payer: Self-pay | Admitting: Podiatry

## 2020-07-25 ENCOUNTER — Ambulatory Visit (INDEPENDENT_AMBULATORY_CARE_PROVIDER_SITE_OTHER): Payer: Self-pay | Admitting: Podiatry

## 2020-07-25 ENCOUNTER — Other Ambulatory Visit: Payer: Self-pay

## 2020-07-25 DIAGNOSIS — G5782 Other specified mononeuropathies of left lower limb: Secondary | ICD-10-CM

## 2020-07-25 DIAGNOSIS — Z9889 Other specified postprocedural states: Secondary | ICD-10-CM

## 2020-07-25 NOTE — Progress Notes (Signed)
  Subjective:  Patient ID: Tonya [name], female    DOB: 1951-01-25,  MRN: [id number]  Chief Complaint  Patient presents with  . Routine Post Op    PT stated that she is still having some pain in her 2nd toe and across the top of the foot along the incision site. She has some soreness in the arch.     DOS: 05/24/2020 Procedure: Neurectomy of neuroma left second interspace  70 y.o. female returns for post-op check.  She is doing well.  Pain over the incision is improving and it is not as hard as it was.  She has her orthotics with her today that were previously made.  She tried wearing them again and they were very uncomfortable, especially the left side where the pad pushes into the bone.  Review of Systems: Negative except as noted in the HPI. Denies N/V/F/Ch.   Objective:  There were no vitals filed for this visit. There is no height or weight on file to calculate BMI. Constitutional Well developed. Well nourished.  Vascular Foot warm and well perfused. Capillary refill normal to all digits.   Neurologic Normal speech. Oriented to person, place, and time. Epicritic sensation to light touch grossly present bilaterally.  Dermatologic  well-healed incision today, no signs of infection or dehiscence, minimally tender  Orthopedic: Tenderness to palpation noted about the surgical site.  She has diffuse pain in the plantar forefoot, worse in the second or space plantarly    Assessment:   No diagnosis found. Plan:  Patient was evaluated and treated and all questions answered.  S/p foot surgery left -Progressing as expected post-operatively. -Pain in the second toe is improving, more in the plantar forefoot now diffusely across the metatarsals.  I inspected her orthotics that she previously had made and this had a large metatarsal offloading pad for the neuroma, she should not need this now that the neuroma is gone.  Additionally seem to think it was a bit too far forward and I think  may be increasing her pain.  I had the suggested by her pedorthist Velora Heckler today and they will be thinned and shortened. -Continue regular activity and shoe gear.  Moving will be good for her to prevent any scar tissue formation -Regular shoe gear as tolerated   No follow-ups on file.

## 2020-08-01 ENCOUNTER — Encounter: Payer: Medicare Other | Admitting: Podiatry

## 2020-08-05 ENCOUNTER — Telehealth: Payer: Self-pay | Admitting: Family Medicine

## 2020-08-05 DIAGNOSIS — J4520 Mild intermittent asthma, uncomplicated: Secondary | ICD-10-CM

## 2020-08-05 DIAGNOSIS — K219 Gastro-esophageal reflux disease without esophagitis: Secondary | ICD-10-CM

## 2020-08-05 DIAGNOSIS — I10 Essential (primary) hypertension: Secondary | ICD-10-CM

## 2020-08-05 MED ORDER — PANTOPRAZOLE SODIUM 20 MG PO TBEC: 20.0000 mg | DELAYED_RELEASE_TABLET | Freq: Every day | ORAL | 3 refills | Status: DC

## 2020-08-05 MED ORDER — LEVOCETIRIZINE DIHYDROCHLORIDE 5 MG PO TABS: ORAL_TABLET | ORAL | 2 refills | Status: DC

## 2020-08-05 MED ORDER — METOPROLOL SUCCINATE ER 25 MG PO TB24: 25.0000 mg | ORAL_TABLET | Freq: Every day | ORAL | 3 refills | Status: DC

## 2020-08-05 MED ORDER — MONTELUKAST SODIUM 10 MG PO TABS: 10.0000 mg | ORAL_TABLET | Freq: Every day | ORAL | 3 refills | Status: DC

## 2020-08-05 MED ORDER — LOSARTAN POTASSIUM 50 MG PO TABS: 50.0000 mg | ORAL_TABLET | Freq: Every day | ORAL | 3 refills | Status: DC

## 2020-08-05 NOTE — Telephone Encounter (Signed)
°  Would like all prescription to be sent to [facility]. (change of pharmacy) Patient requesting 90 days supply    levocetirizine (XYZAL) 5 MG tablet [[id number]]    losartan (COZAAR) 50 MG tablet [[id number]]    metoprolol succinate (TOPROL-XL) 25 MG 24 hr tablet [[id number]]   montelukast (SINGULAIR) 10 MG tablet [[id number]]   pantoprazole (PROTONIX) 20 MG tablet [[id number]]         [id number] [address], [place] Phone:  [phone]  Fax:  [phone]

## 2020-08-12 ENCOUNTER — Telehealth: Payer: Self-pay | Admitting: Podiatry

## 2020-08-12 NOTE — Telephone Encounter (Signed)
Pt left message on general mailbox psosibly Friday stating she got the orthotics and as soon as she put it in her shoe she realized it was not correct. They did not take the lump out of the inserts. She does not need the lump in the orthotic since she had surgery to have the neuroma removed. She said now she has to drive all the way back to [place].But would like a call from [place]

## 2020-08-15 ENCOUNTER — Telehealth: Payer: Self-pay | Admitting: Podiatry

## 2020-08-15 NOTE — Telephone Encounter (Signed)
Pt dropped off left orthotic only because it was not right.  I called pt and she needs the met pad removed on the left orthotic only. I asked about the offload to the toe and she was not sure if needed. She suggested I talk with Dr Sherryle Lis and I told her he was out the rest of this week but I would discuss next week and get them corrected. She asked if they could be back by 3.17 so she could pick them up at the appt. I told her I would try my best but would call when it comes back in.She said thank you.

## 2020-09-05 ENCOUNTER — Other Ambulatory Visit: Payer: Self-pay

## 2020-09-05 ENCOUNTER — Ambulatory Visit (INDEPENDENT_AMBULATORY_CARE_PROVIDER_SITE_OTHER): Payer: PPO | Admitting: Podiatry

## 2020-09-05 DIAGNOSIS — Q66222 Congenital metatarsus adductus, left foot: Secondary | ICD-10-CM

## 2020-09-05 DIAGNOSIS — M2141 Flat foot [pes planus] (acquired), right foot: Secondary | ICD-10-CM | POA: Diagnosis not present

## 2020-09-05 DIAGNOSIS — Z9889 Other specified postprocedural states: Secondary | ICD-10-CM

## 2020-09-05 DIAGNOSIS — M2142 Flat foot [pes planus] (acquired), left foot: Secondary | ICD-10-CM | POA: Diagnosis not present

## 2020-09-05 DIAGNOSIS — Q66221 Congenital metatarsus adductus, right foot: Secondary | ICD-10-CM | POA: Diagnosis not present

## 2020-09-05 DIAGNOSIS — G5782 Other specified mononeuropathies of left lower limb: Secondary | ICD-10-CM

## 2020-09-09 ENCOUNTER — Encounter: Payer: Self-pay | Admitting: Podiatry

## 2020-09-09 NOTE — Progress Notes (Signed)
  Subjective:  Patient ID: Tonya [name], female    DOB: 08/16/1950,  MRN: [id number]  Chief Complaint  Patient presents with  . Routine Post Op    PT stated that her foot is still painful    DOS: 05/24/2020 Procedure: Neurectomy of neuroma left second interspace  70 y.o. female returns for follow-up on her left foot, she received her new orthotics today, she still having some discomfort but overall is improving  Review of Systems: Negative except as noted in the HPI. Denies N/V/F/Ch.   Objective:  There were no vitals filed for this visit. There is no height or weight on file to calculate BMI. Constitutional Well developed. Well nourished.  Vascular Foot warm and well perfused. Capillary refill normal to all digits.   Neurologic Normal speech. Oriented to person, place, and time. Epicritic sensation to light touch grossly present bilaterally.  Dermatologic  well-healed incision not hypertrophic  Orthopedic:  Some pain in the lateral second toe    Assessment:   1. Interdigital neuroma of left foot   2. Post-operative state   3. Pes planus of both feet   4. Metatarsus adductus of both feet    Plan:  Patient was evaluated and treated and all questions answered.  S/p foot surgery left -Progressing as expected post-operatively. -Pain in the second toe is still there but continues to improve -She received her new orthotics today without the metatarsal pad, these will feel much better for her.  We did leave the second ray cut out, if she finds this bothersome we will address them again -Continue regular activity and shoe gear as tolerated   Return in about 3 months (around 11/26/2020) for post op from neuroma 05/24/21, follow up on orthotics.

## 2020-09-20 ENCOUNTER — Encounter: Payer: Self-pay | Admitting: Family Medicine

## 2020-09-20 ENCOUNTER — Ambulatory Visit (INDEPENDENT_AMBULATORY_CARE_PROVIDER_SITE_OTHER): Payer: PPO | Admitting: Family Medicine

## 2020-09-20 ENCOUNTER — Other Ambulatory Visit: Payer: Self-pay

## 2020-09-20 VITALS — BP 122/80 | HR 83 | Temp 98.0°F | Ht 67.0 in | Wt 220.4 lb

## 2020-09-20 DIAGNOSIS — R5383 Other fatigue: Secondary | ICD-10-CM | POA: Diagnosis not present

## 2020-09-20 DIAGNOSIS — Z6834 Body mass index (BMI) 34.0-34.9, adult: Secondary | ICD-10-CM

## 2020-09-20 DIAGNOSIS — Z0000 Encounter for general adult medical examination without abnormal findings: Secondary | ICD-10-CM | POA: Diagnosis not present

## 2020-09-20 DIAGNOSIS — E669 Obesity, unspecified: Secondary | ICD-10-CM

## 2020-09-20 DIAGNOSIS — E66811 Obesity, class 1: Secondary | ICD-10-CM

## 2020-09-20 DIAGNOSIS — R413 Other amnesia: Secondary | ICD-10-CM

## 2020-09-20 LAB — COMPREHENSIVE METABOLIC PANEL
ALT: 27 U/L (ref 0–35)
AST: 28 U/L (ref 0–37)
Albumin: 4.5 g/dL (ref 3.5–5.2)
Alkaline Phosphatase: 71 U/L (ref 39–117)
BUN: 19 mg/dL (ref 6–23)
CO2: 30 mEq/L (ref 19–32)
Calcium: 9.9 mg/dL (ref 8.4–10.5)
Chloride: 102 mEq/L (ref 96–112)
Creatinine, Ser: 0.82 mg/dL (ref 0.40–1.20)
GFR: 73.35 mL/min (ref >60.00)
Glucose, Bld: 86 mg/dL (ref 70–99)
Potassium: 4.8 mEq/L (ref 3.5–5.1)
Sodium: 140 mEq/L (ref 135–145)
Total Bilirubin: 1.1 mg/dL (ref 0.2–1.2)
Total Protein: 7.3 g/dL (ref 6.0–8.3)

## 2020-09-20 LAB — CBC
HCT: 41.8 % (ref 36.0–46.0)
Hemoglobin: 14.2 g/dL (ref 12.0–15.0)
MCHC: 33.9 g/dL (ref 30.0–36.0)
MCV: 98.7 fl (ref 78.0–100.0)
Platelets: 181 10*3/uL (ref 150.0–400.0)
RBC: 4.23 Mil/uL (ref 3.87–5.11)
RDW: 13.2 % (ref 11.5–15.5)
WBC: 7.9 10*3/uL (ref 4.0–10.5)

## 2020-09-20 LAB — LIPID PANEL
Cholesterol: 213 mg/dL — ABNORMAL HIGH (ref 0–200)
HDL: 60.7 mg/dL (ref >39.00)
LDL Cholesterol: 135 mg/dL — ABNORMAL HIGH (ref 0–99)
NonHDL: 152.33
Total CHOL/HDL Ratio: 4
Triglycerides: 85 mg/dL (ref 0.0–149.0)
VLDL: 17 mg/dL (ref 0.0–40.0)

## 2020-09-20 LAB — VITAMIN D 25 HYDROXY (VIT D DEFICIENCY, FRACTURES): VITD: 31.64 ng/mL (ref 30.00–100.00)

## 2020-09-20 LAB — T4, FREE: Free T4: 0.87 ng/dL (ref 0.60–1.60)

## 2020-09-20 LAB — TSH: TSH: 1.31 u[IU]/mL (ref 0.35–4.50)

## 2020-09-20 NOTE — Patient Instructions (Addendum)
Give [place] 2-3 business days to get the results of your labs back.   Keep the diet clean and stay active.  The new Shingrix vaccine (for shingles) is a 2 shot series. It can make people feel low energy, achy and almost like they have the flu for 48 hours after injection. Please plan accordingly when deciding on when to get this shot. Call our office for a nurse visit appointment to get this. The second shot of the series is less severe regarding the side effects, but it still lasts 48 hours.   If you do not hear anything about your referral in the next 1-2 weeks, call our office and ask for an update.  If your memory is not related to your sleeping, please reach out to me.   Let us know if you need anything.

## 2020-09-20 NOTE — Progress Notes (Signed)
Chief Complaint  Patient presents with  . Annual Exam     Well Woman Tonya [name] is here for a complete physical.   Her last physical was >1 year ago.  Current diet: in general, a "healthy" diet. Current exercise: limited due to foot Weight is stable and she confirms daytime fatigue. Seatbelt? Yes Loss of interested in doing things or depression in past 2 weeks? No  Health Maintenance Colonoscopy- Yes Shingrix- No DEXA- Yes Mammogram- Yes Tetanus- Yes Pneumonia- Yes Hep C screen- Yes   Fatigue The patient has had several months of fatigue.  She will get around 7 and half hours of sleep nightly.  Around 230, she will become extremely tired and if she does not nap, she will be nearly nonfunctional by the time early evening comes around.  She does snore intermittently but does not wake up gasping for air.  She does get irritable at times, but usually there is a decent reason for it.  No anxiety or depression.  Of note, her memory seems to be suffering as well, it is worse at the end of the night.  Past Medical History:  Diagnosis Date  . Hypertension   . Osteopenia      Past Surgical History:  Procedure Laterality Date  . ABDOMINAL HYSTERECTOMY  2015  . AUGMENTATION MAMMAPLASTY Left   . BREAST BIOPSY Right 02/2019  . BREAST IMPLANT EXCHANGE    . MASTECTOMY Left   . REDUCTION MAMMAPLASTY Right   . TONSILLECTOMY AND ADENOIDECTOMY  1958    Medications  Current Outpatient Medications on File Prior to Visit  Medication Sig Dispense Refill  . calcium-vitamin D (OSCAL WITH D) 250-125 MG-UNIT tablet Take 1 tablet by mouth daily.    . fluconazole (DIFLUCAN) 150 MG tablet Take 150 mg by mouth once.    Marland Kitchen levocetirizine (XYZAL) 5 MG tablet TAKE 1 TABLET BY MOUTH EVERY DAY IN THE EVENING 30 tablet 2  . losartan (COZAAR) 50 MG tablet Take 1 tablet (50 mg total) by mouth daily. 90 tablet 3  . metoprolol succinate (TOPROL-XL) 25 MG 24 hr tablet Take 1 tablet (25 mg total) by mouth  daily. 90 tablet 3  . metroNIDAZOLE (METROGEL) 0.75 % vaginal gel Place 1 Applicatorful vaginally at bedtime. Apply one applicatorful to vagina at bedtime for 5 days 70 g 1  . montelukast (SINGULAIR) 10 MG tablet Take 1 tablet (10 mg total) by mouth at bedtime. 90 tablet 3  . pantoprazole (PROTONIX) 20 MG tablet Take 1 tablet (20 mg total) by mouth daily. 90 tablet 3  . valACYclovir (VALTREX) 1000 MG tablet Take 1 tablet (1,000 mg total) by mouth 2 (two) times daily. 30 tablet 4   Allergies No Known Allergies  Review of Systems: Constitutional:  no fevers Eye:  no recent significant change in vision Ears:  No changes in hearing Nose/Mouth/Throat:  no complaints of nasal congestion, no sore throat Cardiovascular: no chest pain Respiratory:  No shortness of breath Gastrointestinal:  No change in bowel habits GU:  Female: negative for dysuria Integumentary:  no abnormal skin lesions reported Neurologic: +memory issues Endocrine:  denies unexplained weight changes  Exam BP 122/80 (BP Location: Right Arm, Patient Position: Sitting, Cuff Size: Normal)   Pulse 83   Temp 98 F (36.7 C) (Oral)   Ht 5\' 7"  (1.702 m)   Wt 220 lb 6 oz (100 kg)   SpO2 97%   BMI 34.52 kg/m  General:  well developed, well nourished, in no apparent  distress Skin:  no significant moles, warts, or growths Head:  no masses, lesions, or tenderness Eyes:  pupils equal and round, sclera anicteric without injection Ears:  canals without lesions, TMs shiny without retraction, no obvious effusion, no erythema Nose:  nares patent, septum midline, mucosa normal, and no drainage or sinus tenderness Throat/Pharynx:  lips and gingiva without lesion; tongue and uvula midline; non-inflamed pharynx; no exudates or postnasal drainage Neck: neck supple without adenopathy, thyromegaly, or masses Lungs:  clear to auscultation, breath sounds equal bilaterally, no respiratory distress Cardio:  regular rate and rhythm, no bruits or  LE edema Abdomen:  abdomen soft, nontender; bowel sounds normal; no masses or organomegaly Genital: Deferred Neuro:  gait normal; deep tendon reflexes normal and symmetric Psych: well oriented with normal range of affect and appropriate judgment/insight  Assessment and Plan  Well adult exam - Plan: Comprehensive metabolic panel, Lipid panel  Fatigue, unspecified type - Plan: Ambulatory referral to Pulmonology, CBC, TSH, T4, free, VITAMIN D 25 Hydroxy (Vit-D Deficiency, Fractures)  Class 1 obesity without serious comorbidity with body mass index (BMI) of 34.0 to 34.9 in adult, unspecified obesity type  Memory change   Well 70 y.o. female. Counseled on diet and exercise. Other orders as above. Refer to pulm and ck labs for fatigue. Could be related to memory issues. Not obviously related to mood. If no signs of sleep being related to memory, will obtain CT head w/o contrast and refer to neuropsych.  Follow up in 6 mo otherwise or prn. The patient voiced understanding and agreement to the plan.  [place], DO 09/20/20 2:12 PM

## 2020-10-29 ENCOUNTER — Other Ambulatory Visit: Payer: Self-pay

## 2020-10-29 ENCOUNTER — Ambulatory Visit (INDEPENDENT_AMBULATORY_CARE_PROVIDER_SITE_OTHER): Payer: PPO | Admitting: Pulmonary Disease

## 2020-10-29 ENCOUNTER — Encounter: Payer: Self-pay | Admitting: Pulmonary Disease

## 2020-10-29 VITALS — BP 118/82 | HR 82 | Temp 98.6°F | Ht 67.0 in | Wt 223.8 lb

## 2020-10-29 DIAGNOSIS — R5383 Other fatigue: Secondary | ICD-10-CM | POA: Diagnosis not present

## 2020-10-29 NOTE — Progress Notes (Signed)
Tonya [name]    [id number]    [birth year]  Primary Care Physician:Wendling, Crosby Oyster, DO  Referring Physician: [name], [address] [place],  [place] [address]  Chief complaint:  Patient being seen for excessive daytime sleepiness Nonrestorative sleep  HPI:  Daytime sleepiness for many years Worse in the last couple years Usually tries to go to bed between 10 and 1030 takes about 5 to 10 minutes to fall asleep  1-2 awakenings Final wake up time between 6 and 630  Over years would usually take about an hour to 2-hour nap during the day Unable to function if she does not take this nap  Would usually start feeling sleepy again by 8 to 8:30 PM  She does have some musculoskeletal pain that may be contributing to nonrestorative sleep  She has associated dry mouth and dry eyes with taking allergy pills at night and this is better No morning headaches Unaware of parents snoring Never smoker  Memory is good  She is usually very active, does not exercise on a regular basis   Outpatient Encounter Medications as of 10/29/2020  Medication Sig  . calcium-vitamin D (OSCAL WITH D) 250-125 MG-UNIT tablet Take 1 tablet by mouth daily.  . fluconazole (DIFLUCAN) 150 MG tablet Take 150 mg by mouth once.  Marland Kitchen levocetirizine (XYZAL) 5 MG tablet TAKE 1 TABLET BY MOUTH EVERY DAY IN THE EVENING  . losartan (COZAAR) 50 MG tablet Take 1 tablet (50 mg total) by mouth daily.  . metoprolol succinate (TOPROL-XL) 25 MG 24 hr tablet Take 1 tablet (25 mg total) by mouth daily.  . metroNIDAZOLE (METROGEL) 0.75 % vaginal gel Place 1 Applicatorful vaginally at bedtime. Apply one applicatorful to vagina at bedtime for 5 days  . montelukast (SINGULAIR) 10 MG tablet Take 1 tablet (10 mg total) by mouth at bedtime.  . pantoprazole (PROTONIX) 20 MG tablet Take 1 tablet (20 mg total) by mouth daily.  . valACYclovir (VALTREX) 1000 MG tablet Take 1 tablet (1,000 mg  total) by mouth 2 (two) times daily.   No facility-administered encounter medications on file as of 10/29/2020.    Allergies as of 10/29/2020  . (No Known Allergies)    Past Medical History:  Diagnosis Date  . Hypertension   . Osteopenia     Past Surgical History:  Procedure Laterality Date  . ABDOMINAL HYSTERECTOMY  2015  . AUGMENTATION MAMMAPLASTY Left   . BREAST BIOPSY Right 02/2019  . BREAST IMPLANT EXCHANGE    . MASTECTOMY Left   . REDUCTION MAMMAPLASTY Right   . TONSILLECTOMY AND ADENOIDECTOMY  1958    Family History  Problem Relation Age of Onset  . Heart attack Father        died from  . Breast cancer Maternal Aunt   . Breast cancer Paternal Aunt     Social History   Socioeconomic History  . Marital status: Married    Spouse name: Not on file  . Number of children: Not on file  . Years of education: Not on file  . Highest education level: Not on file  Occupational History  . Not on file  Tobacco Use  . Smoking status: Never Smoker  . Smokeless tobacco: Never Used  Vaping Use  . Vaping Use: Never used  Substance and Sexual Activity  . Alcohol use: Not on file    Comment: rarely  . Drug use: Never  . Sexual activity:  Yes    Birth control/protection: None  Other Topics Concern  . Not on file  Social History Narrative  . Not on file   Social Determinants of Health   Financial Resource Strain: Not on file  Food Insecurity: Not on file  Transportation Needs: Not on file  Physical Activity: Not on file  Stress: Not on file  Social Connections: Not on file  Intimate Partner Violence: Not on file    Review of Systems  Constitutional: Positive for fatigue.  Musculoskeletal: Positive for arthralgias and back pain.  Psychiatric/Behavioral: Positive for sleep disturbance.    Vitals:   10/29/20 0929  BP: 118/82  Pulse: 82  Temp: 98.6 F (37 C)  SpO2: 95%     Physical Exam Constitutional:      Appearance: She is obese.  HENT:      Head: Normocephalic.     Nose: No congestion.     Mouth/Throat:     Mouth: Mucous membranes are moist.  Eyes:     Pupils: Pupils are equal, round, and reactive to light.  Cardiovascular:     Rate and Rhythm: Normal rate and regular rhythm.     Heart sounds: No murmur heard. No friction rub.  Pulmonary:     Effort: No respiratory distress.     Breath sounds: No stridor. No wheezing or rhonchi.  Musculoskeletal:     Cervical back: No rigidity or tenderness.  Neurological:     Mental Status: She is alert.  Psychiatric:        Mood and Affect: Mood normal.   Epworth Sleepiness Scale of 11  Assessment:  Moderate probability of of significant obstructive sleep apnea  Nonrestorative sleep  Daytime sleepiness  Musculokeletal pain and discomfort may be contributing to nonrestorative sleep  Pathophysiology of sleep disordered breathing reviewed with the patient Treatment options for sleep disordered breathing reviewed   Plan/Recommendations:  Schedule home sleep study  Graded exercise as tolerated  Pain management as tolerated  Tentative follow-up in 3 to 4 months  Importance of exercise discussed  Tentative follow-up in 3 to 4 months   Sherrilyn Rist MD [facility] 10/29/2020, 10:02 AM  CC: [name]*

## 2020-10-29 NOTE — Patient Instructions (Signed)
Moderate probability of significant obstructive sleep apnea  Nonrestorative sleep  Daytime sleepiness  We will schedule you for a home sleep study We will update your results as soon as reviewed  Treatment options as we discussed  Graded exercises as tolerated  Tentative follow-up in 3 to 4 months   Sleep Apnea Sleep apnea is a condition in which breathing pauses or becomes shallow during sleep. Episodes of sleep apnea usually last 10 seconds or longer, and they may occur as many as 20 times an hour. Sleep apnea disrupts your sleep and keeps your body from getting the rest that it needs. This condition can increase your risk of certain health problems, including:  Heart attack.  Stroke.  Obesity.  Diabetes.  Heart failure.  Irregular heartbeat. What are the causes? There are three kinds of sleep apnea:  Obstructive sleep apnea. This kind is caused by a blocked or collapsed airway.  Central sleep apnea. This kind happens when the part of the brain that controls breathing does not send the correct signals to the muscles that control breathing.  Mixed sleep apnea. This is a combination of obstructive and central sleep apnea. The most common cause of this condition is a collapsed or blocked airway. An airway can collapse or become blocked if:  Your throat muscles are abnormally relaxed.  Your tongue and tonsils are larger than normal.  You are overweight.  Your airway is smaller than normal.   What increases the risk? You are more likely to develop this condition if you:  Are overweight.  Smoke.  Have a smaller than normal airway.  Are elderly.  Are female.  Drink alcohol.  Take sedatives or tranquilizers.  Have a family history of sleep apnea. What are the signs or symptoms? Symptoms of this condition include:  Trouble staying asleep.  Daytime sleepiness and tiredness.  Irritability.  Loud snoring.  Morning headaches.  Trouble  concentrating.  Forgetfulness.  Decreased interest in sex.  Unexplained sleepiness.  Mood swings.  Personality changes.  Feelings of depression.  Waking up often during the night to urinate.  Dry mouth.  Sore throat. How is this diagnosed? This condition may be diagnosed with:  A medical history.  A physical exam.  A series of tests that are done while you are sleeping (sleep study). These tests are usually done in a sleep lab, but they may also be done at home. How is this treated? Treatment for this condition aims to restore normal breathing and to ease symptoms during sleep. It may involve managing health issues that can affect breathing, such as high blood pressure or obesity. Treatment may include:  Sleeping on your side.  Using a decongestant if you have nasal congestion.  Avoiding the use of depressants, including alcohol, sedatives, and narcotics.  Losing weight if you are overweight.  Making changes to your diet.  Quitting smoking.  Using a device to open your airway while you sleep, such as: ? An oral appliance. This is a custom-made mouthpiece that shifts your lower jaw forward. ? A continuous positive airway pressure (CPAP) device. This device blows air through a mask when you breathe out (exhale). ? A nasal expiratory positive airway pressure (EPAP) device. This device has valves that you put into each nostril. ? A bi-level positive airway pressure (BPAP) device. This device blows air through a mask when you breathe in (inhale) and breathe out (exhale).  Having surgery if other treatments do not work. During surgery, excess tissue is removed to  create a wider airway. It is important to get treatment for sleep apnea. Without treatment, this condition can lead to:  High blood pressure.  Coronary artery disease.  In men, an inability to achieve or maintain an erection (impotence).  Reduced thinking abilities.   Follow these instructions at  home: Lifestyle  Make any lifestyle changes that your health care provider recommends.  Eat a healthy, well-balanced diet.  Take steps to lose weight if you are overweight.  Avoid using depressants, including alcohol, sedatives, and narcotics.  Do not use any products that contain nicotine or tobacco, such as cigarettes, e-cigarettes, and chewing tobacco. If you need help quitting, ask your health care provider. General instructions  Take over-the-counter and prescription medicines only as told by your health care provider.  If you were given a device to open your airway while you sleep, use it only as told by your health care provider.  If you are having surgery, make sure to tell your health care provider you have sleep apnea. You may need to bring your device with you.  Keep all follow-up visits as told by your health care provider. This is important. Contact a health care provider if:  The device that you received to open your airway during sleep is uncomfortable or does not seem to be working.  Your symptoms do not improve.  Your symptoms get worse. Get help right away if:  You develop: ? Chest pain. ? Shortness of breath. ? Discomfort in your back, arms, or stomach.  You have: ? Trouble speaking. ? Weakness on one side of your body. ? Drooping in your face. These symptoms may represent a serious problem that is an emergency. Do not wait to see if the symptoms will go away. Get medical help right away. Call your local emergency services (911 in the [place] Do not drive yourself to the hospital. Summary  Sleep apnea is a condition in which breathing pauses or becomes shallow during sleep.  The most common cause is a collapsed or blocked airway.  The goal of treatment is to restore normal breathing and to ease symptoms during sleep. This information is not intended to replace advice given to you by your health care provider. Make sure you discuss any questions you  have with your health care provider. Document Revised: 11/23/2018 Document Reviewed: 02/01/2018 Elsevier Patient Education  2021 [employer].

## 2020-10-30 DIAGNOSIS — L820 Inflamed seborrheic keratosis: Secondary | ICD-10-CM | POA: Diagnosis not present

## 2020-11-13 ENCOUNTER — Other Ambulatory Visit: Payer: Self-pay | Admitting: Family Medicine

## 2020-11-13 ENCOUNTER — Telehealth: Payer: Self-pay | Admitting: Pulmonary Disease

## 2020-11-13 DIAGNOSIS — Z1231 Encounter for screening mammogram for malignant neoplasm of breast: Secondary | ICD-10-CM

## 2020-11-13 DIAGNOSIS — Z9012 Acquired absence of left breast and nipple: Secondary | ICD-10-CM

## 2020-11-13 NOTE — Telephone Encounter (Signed)
Attempted to sched HST for pt. Pt states she would like to know if it is ok w/ AO to hold off on sleep study b/c she believes her drowsiness was contributed by some allergy medication she has been taking. Also states she's been more active and changed her diet and thinks it's helping her sleep better throughout the night. Please Advise.

## 2020-11-13 NOTE — Telephone Encounter (Signed)
Dr. Ander Slade please advise

## 2020-11-14 NOTE — Telephone Encounter (Signed)
Patient returned call.  Dr. Judson Roch recommendations given.  Nothing further at this time.

## 2020-11-14 NOTE — Telephone Encounter (Signed)
Yes we can hold off, can revisit if symptoms recur

## 2020-11-14 NOTE — Telephone Encounter (Signed)
Lm for patient.  

## 2020-11-26 ENCOUNTER — Other Ambulatory Visit: Payer: Self-pay

## 2020-11-26 ENCOUNTER — Ambulatory Visit (INDEPENDENT_AMBULATORY_CARE_PROVIDER_SITE_OTHER): Payer: PPO | Admitting: Podiatry

## 2020-11-26 DIAGNOSIS — G5782 Other specified mononeuropathies of left lower limb: Secondary | ICD-10-CM

## 2020-11-29 ENCOUNTER — Telehealth: Payer: Self-pay | Admitting: *Deleted

## 2020-11-29 NOTE — Telephone Encounter (Signed)
Patient is calling her 2nd toe was really hurting last night (excruciating pain)the pain started after last office visit,feels broken.Please advise.  Please schedule upcoming appointment.

## 2020-12-01 NOTE — Progress Notes (Signed)
  Subjective:  Patient ID: Tonya [name], female    DOB: Feb 19, 1951,  MRN: [id number]  Chief Complaint  Patient presents with   Routine Post Op      POV #[id number] 05/24/2020 NEURECTOMY OF THE LEFT FOOT/puo( corrected left only)(orthtoic in)    DOS: 05/24/2020 Procedure: Neurectomy of neuroma left second interspace  70 y.o. female returns for follow-up on her left foot, the orthotics are still uncomfortable and she would like the digit on the left side to be removed and match the other side  Review of Systems: Negative except as noted in the HPI. Denies N/V/F/Ch.   Objective:  There were no vitals filed for this visit. There is no height or weight on file to calculate BMI. Constitutional Well developed. Well nourished.  Vascular Foot warm and well perfused. Capillary refill normal to all digits.   Neurologic Normal speech. Oriented to person, place, and time. Epicritic sensation to light touch grossly present bilaterally.  Dermatologic  well-healed incision not hypertrophic  Orthopedic: Still having some pain around the second MTPJ    Assessment:   No diagnosis found.  Plan:  Patient was evaluated and treated and all questions answered.  S/p foot surgery left -Progressing as expected post-operatively. -She is still having pain in the second MTPJ.  We discussed possibility of another injection she would like to avoid this for now. -Send her orthotics back for removal of the divot -Continue regular activity and shoe gear as tolerated   Return in about 6 months (around 05/28/2021).

## 2020-12-02 ENCOUNTER — Telehealth: Payer: Self-pay | Admitting: Podiatry

## 2020-12-02 NOTE — Telephone Encounter (Signed)
Adjusted orthotics in.. pt aware ok to pick up.

## 2020-12-24 ENCOUNTER — Telehealth: Payer: Self-pay

## 2020-12-24 NOTE — Telephone Encounter (Signed)
Nurse Assessment Nurse: [name], RN, [name] Date/Time Tonya [name] Time): 12/24/2020 1:01:00 PM Confirm and document reason for call. If symptomatic, describe symptoms. ---Caller has neck pain that radiates into her shoulder on her left side. Patient feels a lump in her abd and it is tender. Caller states no other sx. Does the patient have any new or worsening symptoms? ---Yes Will a triage be completed? ---Yes Related visit to physician within the last 2 weeks? ---No Does the PT have any chronic conditions? (i.e. diabetes, asthma, this includes High risk factors for pregnancy, etc.) ---Yes List chronic conditions. ---Asthma, HTN Is this a behavioral health or substance abuse call? ---No Guidelines Guideline Title Affirmed Question Affirmed Notes Nurse Date/Time Tonya [name] Time) Neck Pain or Stiffness High-risk adult (e.g., history of cancer, HIV, or IV drug use) [name], RN, [name] 12/24/2020 1:03:21 PM Disp. Time Tonya [name] Time) Disposition Final User 12/24/2020 1:12:29 PM See PCP within 24 Hours Yes [name], RN, [name] PLEASE NOTE: All timestamps contained within this report are represented as [place] Standard Time. CONFIDENTIALTY NOTICE: This fax transmission is intended only for the addressee. It contains information that is legally privileged, confidential or otherwise protected from use or disclosure. If you are not the intended recipient, you are strictly prohibited from reviewing, disclosing, copying using or disseminating any of this information or taking any action in reliance on or regarding this information. If you have received this fax in error, please notify us immediately by telephone so that we can arrange for its return to [place]. Phone: [phone], Toll-Free: [phone], Fax: [phone] Page: 2 of 2 Call Id: [id number] [name] Disagree/Comply Comply Caller Understands Yes PreDisposition Call Doctor Care Advice Given Per Guideline SEE PCP WITHIN  24 HOURS: * IF OFFICE WILL BE OPEN: You need to be examined within the next 24 hours. Call your doctor (or NP/PA) when the office opens and make an appointment. CALL BACK IF: * Fever occurs * You become worse CARE ADVICE given per Neck Pain (Adult) guideline. Referrals REFERRED TO PCP OFFICE  Appt scheduled w. Dr. Larose Kells tomorrow.

## 2020-12-25 ENCOUNTER — Ambulatory Visit (INDEPENDENT_AMBULATORY_CARE_PROVIDER_SITE_OTHER): Payer: PPO | Admitting: Internal Medicine

## 2020-12-25 ENCOUNTER — Ambulatory Visit (HOSPITAL_BASED_OUTPATIENT_CLINIC_OR_DEPARTMENT_OTHER)
Admission: RE | Admit: 2020-12-25 | Discharge: 2020-12-25 | Disposition: A | Discharge status: Home / Self Care | Payer: PPO | Source: Ambulatory Visit | Attending: Internal Medicine | Admitting: Internal Medicine

## 2020-12-25 ENCOUNTER — Other Ambulatory Visit: Payer: Self-pay

## 2020-12-25 ENCOUNTER — Encounter: Payer: Self-pay | Admitting: Internal Medicine

## 2020-12-25 VITALS — BP 142/92 | HR 85 | Temp 98.4°F | Resp 16 | Ht 67.0 in | Wt 221.2 lb

## 2020-12-25 DIAGNOSIS — M549 Dorsalgia, unspecified: Secondary | ICD-10-CM | POA: Diagnosis not present

## 2020-12-25 DIAGNOSIS — M47814 Spondylosis without myelopathy or radiculopathy, thoracic region: Secondary | ICD-10-CM | POA: Diagnosis not present

## 2020-12-25 IMAGING — DX DG THORACIC SPINE 3V
3 series · 3 of 3 positions shown · non-contrast
Comparison: Chest x-ray dated [DATE].

CLINICAL DATA: Upper back lump for the past month.

EXAM:
THORACIC SPINE - 3 VIEWS

[t-spine lat]
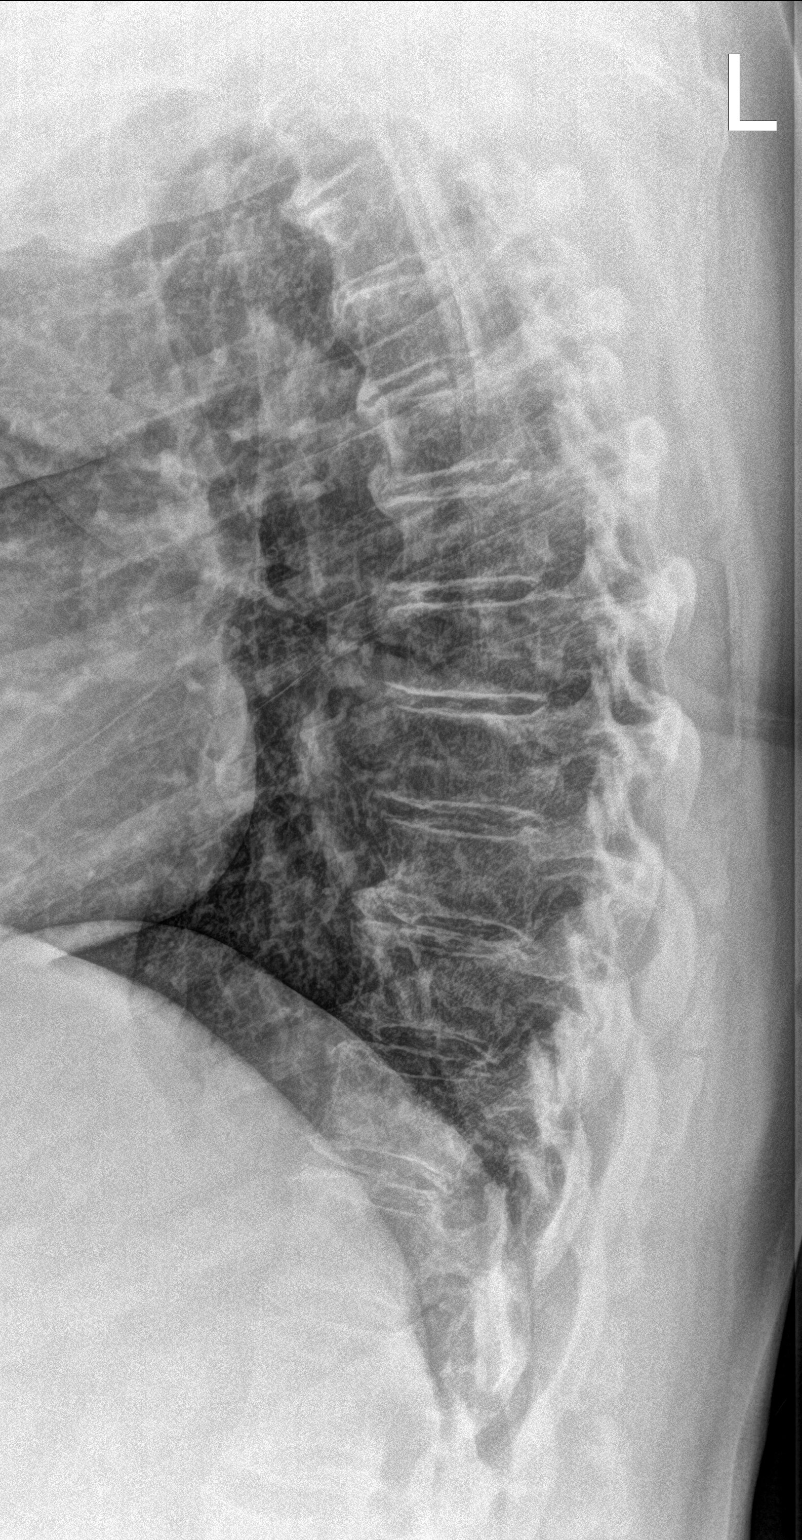

[t-spine swimmers]
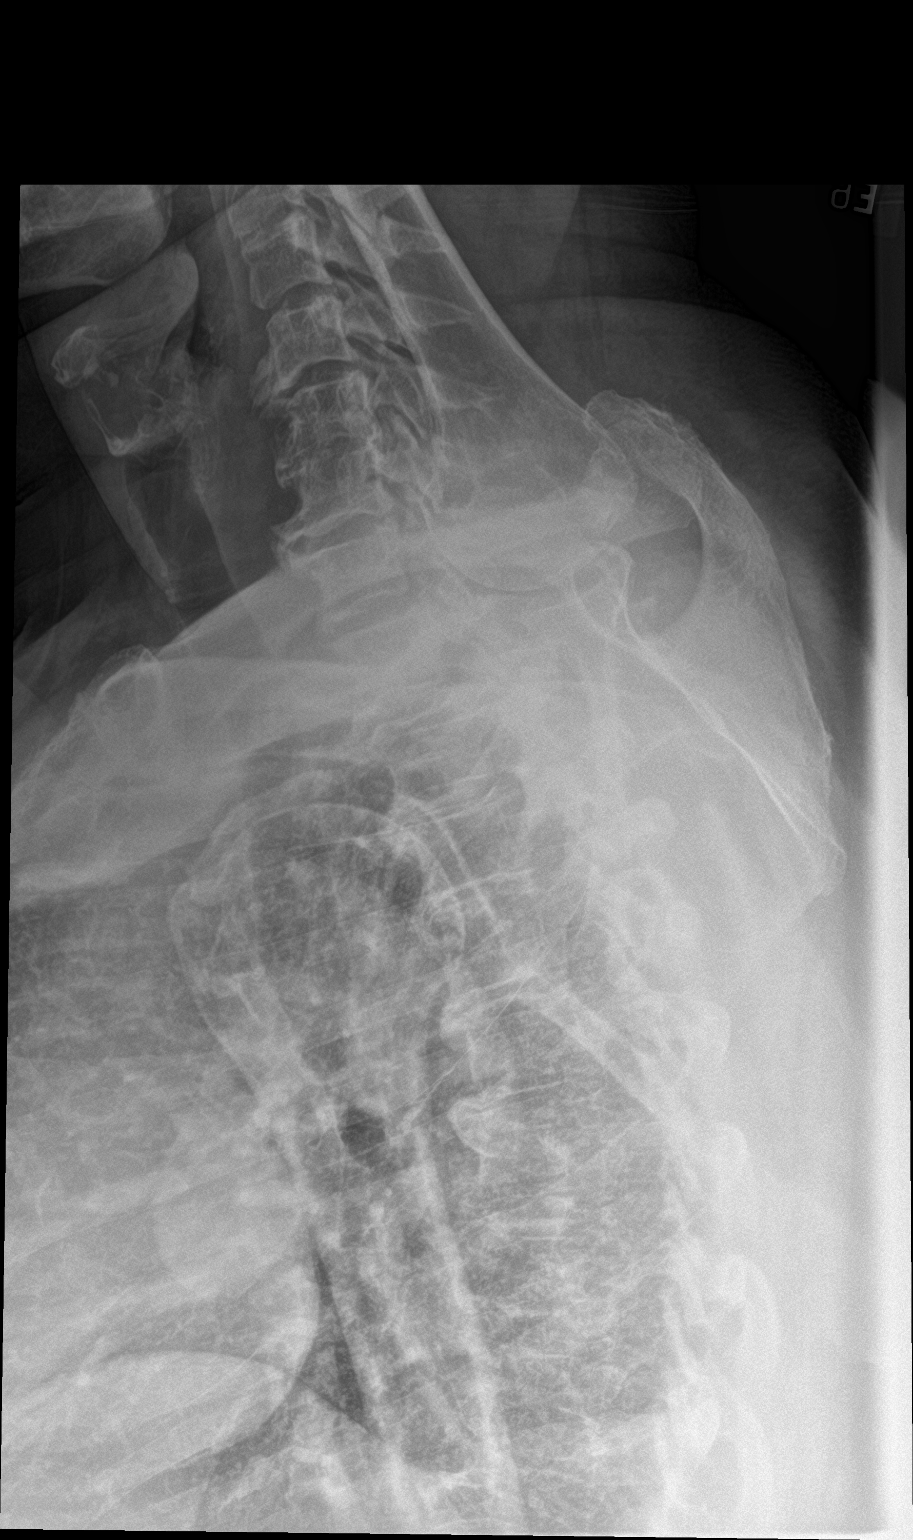

[t-spine ap]
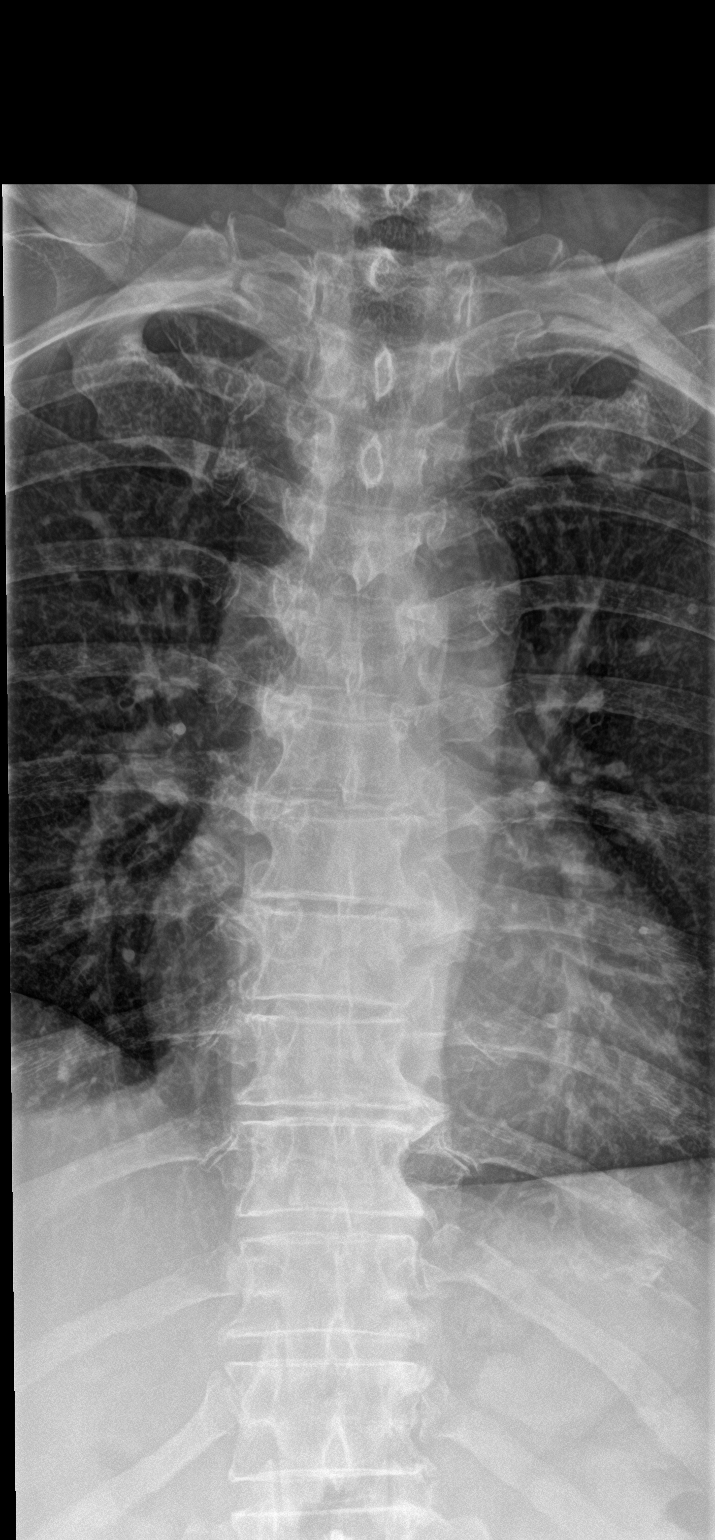

[3 of 3 positions shown; findings below may reference images not displayed]

FINDINGS: Twelve rib-bearing thoracic vertebral bodies.

No acute fracture or subluxation. Vertebral body heights are
preserved.

Alignment is normal. Mild multilevel disc height loss and endplate
spurring.
IMPRESSION: 1. Mild multilevel degenerative changes.

## 2020-12-25 MED ORDER — CELECOXIB 200 MG PO CAPS: 200.0000 mg | ORAL_CAPSULE | Freq: Every day | ORAL | 0 refills | Status: DC | PRN

## 2020-12-25 NOTE — Progress Notes (Signed)
Subjective:    Patient ID: Tonya [name], female    DOB: 1950-10-31, 70 y.o.   MRN: [id number]  DOS:  12/25/2020 Type of visit - description: Acute  For a while she has on and off upper back pains. About 10 days ago the pain localized at the left side of the upper thoracic spine.  The area feels numb at times but she also experiences sharp pains there. Pain does not radiate to the extremities. Increased only with neck flexion otherwise neck, other neck  movements are okay. Denies any paresthesias of the upper or lower extremities. No recent fall or injury No previous neck surgery. Gait is normal  Review of Systems See above   Past Medical History:  Diagnosis Date   Hypertension    Osteopenia     Past Surgical History:  Procedure Laterality Date   ABDOMINAL HYSTERECTOMY  2015   AUGMENTATION MAMMAPLASTY Left    BREAST BIOPSY Right 02/2019   BREAST IMPLANT EXCHANGE     MASTECTOMY Left    REDUCTION MAMMAPLASTY Right    TONSILLECTOMY AND ADENOIDECTOMY  1958    Allergies as of 12/25/2020   No Known Allergies      Medication List        Accurate as of December 25, 2020 11:59 PM. If you have any questions, ask your nurse or doctor.          STOP taking these medications    fluconazole 150 MG tablet Commonly known as: DIFLUCAN Stopped by: Kathlene November, MD   levocetirizine 5 MG tablet Commonly known as: XYZAL Stopped by: Kathlene November, MD   metroNIDAZOLE 0.75 % vaginal gel Commonly known as: METROGEL Stopped by: Kathlene November, MD       TAKE these medications    calcium-vitamin D 250-125 MG-UNIT tablet Commonly known as: OSCAL WITH D Take 1 tablet by mouth daily.   celecoxib 200 MG capsule Commonly known as: CELEBREX Take 1 capsule (200 mg total) by mouth daily as needed. Started by: Kathlene November, MD   losartan 50 MG tablet Commonly known as: COZAAR Take 1 tablet (50 mg total) by mouth daily.   metoprolol succinate 25 MG 24 hr tablet Commonly known as:  TOPROL-XL Take 1 tablet (25 mg total) by mouth daily.   montelukast 10 MG tablet Commonly known as: SINGULAIR Take 1 tablet (10 mg total) by mouth at bedtime.   pantoprazole 20 MG tablet Commonly known as: PROTONIX Take 1 tablet (20 mg total) by mouth daily.   valACYclovir 1000 MG tablet Commonly known as: Valtrex Take 1 tablet (1,000 mg total) by mouth 2 (two) times daily.           Objective:   Physical Exam Musculoskeletal:       Arms:   BP (!) 142/92 (BP Location: Right Arm, Patient Position: Sitting, Cuff Size: Small)   Pulse 85   Temp 98.4 F (36.9 C) (Oral)   Resp 16   Ht 5\' 7"  (1.702 m)   Wt 221 lb 4 oz (100.4 kg)   SpO2 98%   BMI 34.65 kg/m  General:   Well developed, NAD, BMI noted. HEENT:  Normocephalic . Face symmetric, atraumatic Neck: Symmetric, no TTP, no LAD's, range of motion is normal Thoracic spine: See graphic. Lower extremities: no pretibial edema bilaterally  Skin: Not pale. Not jaundice Neurologic:  alert & oriented X3.  Speech normal, gait appropriate for age and unassisted.  Motor and DTR symmetric Psych--  Cognition and judgment appear  intact.  Cooperative with normal attention span and concentration.  Behavior appropriate. No anxious or depressed appearing.      Assessment     70 year old female, PMH includes HTN, GERD, allergies, osteopenia, breast ca 2010, her L breast implant was changed for/2021.  Upper back sprain: C/o  left-sided upper back pain, possibly sprain, doubt radiculopathy. Plan: Thoracic x-ray, Celebrex (previously tried meloxicam and had issues with it, diarrhea?).  I offered her a muscle relaxant but she declines. Also recommend warm compress, Tylenol as needed, call if not better.  This visit occurred during the SARS-CoV-2 public health emergency.  Safety protocols were in place, including screening questions prior to the visit, additional usage of staff PPE, and extensive cleaning of exam room while  observing appropriate contact time as indicated for disinfecting solutions.

## 2020-12-25 NOTE — Patient Instructions (Signed)
Get your x-ray at the first floor  For pain take Celebrex 200 mg 1 tablet daily as needed Always take it with food because may cause gastritis and ulcers.  If you notice nausea, stomach pain, change in the color of stools --->  Stop the medicine and let us know  Apply a warm compress twice daily  You can also take some Tylenol  If you are not better in the next 10 to 14 days, please call us.

## 2020-12-30 ENCOUNTER — Other Ambulatory Visit: Payer: Self-pay

## 2020-12-30 ENCOUNTER — Ambulatory Visit (INDEPENDENT_AMBULATORY_CARE_PROVIDER_SITE_OTHER): Payer: PPO | Admitting: Family Medicine

## 2020-12-30 ENCOUNTER — Encounter: Payer: Self-pay | Admitting: Family Medicine

## 2020-12-30 VITALS — BP 128/80 | HR 90 | Temp 97.8°F | Ht 67.0 in | Wt 223.2 lb

## 2020-12-30 DIAGNOSIS — G8929 Other chronic pain: Secondary | ICD-10-CM | POA: Diagnosis not present

## 2020-12-30 DIAGNOSIS — S46812A Strain of other muscles, fascia and tendons at shoulder and upper arm level, left arm, initial encounter: Secondary | ICD-10-CM | POA: Diagnosis not present

## 2020-12-30 DIAGNOSIS — M546 Pain in thoracic spine: Secondary | ICD-10-CM

## 2020-12-30 MED ORDER — NAPROXEN 500 MG PO TBEC: 500.0000 mg | DELAYED_RELEASE_TABLET | Freq: Two times a day (BID) | ORAL | 0 refills | Status: DC

## 2020-12-30 NOTE — Progress Notes (Signed)
Musculoskeletal Exam  Patient: Tonya [name] DOB: 11-29-1950  DOS: 12/30/2020  SUBJECTIVE:  Chief Complaint:   Chief Complaint  Patient presents with   Back Pain    Would like to stop celebrex     Tonya [name] is a 70 y.o.  female for evaluation and treatment of L sided neck/upper back pain.   Onset:  2 weeks ago. No inj or change in activity.  Location:  Character:  aching and sharp  Progression of issue:  is unchanged Associated symptoms: no brsuiing, redness, swelling; some decreased ROM to the L Treatment: to date has been prescription NSAIDS, Advil, heating pad.   She felt "drugged up" while taking Celebrex and felt like she had to focus on her breathing more.  She has been on it for 4 days and would like to come off of it. Neurovascular symptoms: no  L thor back pain Not getting any better. No bruising, redness, swelling. No inj or change in activity. She has failed HEP.  No urinary issues.  She also notices pain over the soft of her left abdomen.  No nausea, vomiting, or bowel changes.  Denies bleeding.  It is a constant ache.  Bowel movements do not affect her pain.  Nothing has other than laying still.  Past Medical History:  Diagnosis Date   Hypertension    Osteopenia     Objective: VITAL SIGNS: BP 128/80   Pulse 90   Temp 97.8 F (36.6 C) (Oral)   Ht 5\' 7"  (1.702 m)   Wt 223 lb 4 oz (101.3 kg)   SpO2 97%   BMI 34.97 kg/m  Constitutional: Well formed, well developed. No acute distress. Thorax & Lungs: No accessory muscle use Musculoskeletal: L trap.   Normal active range of motion: yes.   Normal passive range of motion: yes Tenderness to palpation: yes over medial trap and L thor parasp msc Deformity: no Ecchymosis: no Tests positive: none Tests negative: Spurling's Abd: BS+, S, ND, NT Neurologic: Normal sensory function.  Gait is normal. Psychiatric: Normal mood. Age appropriate judgment and insight. Alert & oriented x 3.     Assessment:  Strain of left trapezius muscle, initial encounter - Plan: naproxen (EC NAPROSYN) 500 MG EC tablet  Chronic left-sided thoracic back pain - Plan: Urine Microscopic Only, naproxen (EC NAPROSYN) 500 MG EC tablet, Ambulatory referral to Sports Medicine  Plan: Trial Naproxen, stop Celebrex. Declines msc relaxer. Stretches/exercises, heat, ice, Tylenol.  Refer to Dr Tamala Julian of sports med for a more definitive dx. Ck urine micro to r/o bleeding. Consider PT if msk. If not, will consider GI, though based on hx and exam, this is the less likely culprit.  F/u as originally scheduled or prn. The patient voiced understanding and agreement to the plan.  I spent 42 min with the pt and reviewing her chart on the same day of visit.   [place], DO 12/30/20  11:58 AM

## 2020-12-30 NOTE — Patient Instructions (Addendum)
Heat (pad or rice pillow in microwave) over affected area, 10-15 minutes twice daily.   Ice/cold pack over area for 10-15 min twice daily.  OK to take Tylenol 1000 mg (2 extra strength tabs) or 975 mg (3 regular strength tabs) every 6 hours as needed.  If you do not hear anything about your referral in the next 1-2 weeks, call our office and ask for an update.  Let us know if you need anything.  Trapezius stretches/exercises Do exercises exactly as told by your health care provider and adjust them as directed. It is normal to feel mild stretching, pulling, tightness, or discomfort as you do these exercises, but you should stop right away if you feel sudden pain or your pain gets worse.   Stretching and range of motion exercises These exercises warm up your muscles and joints and improve the movement and flexibility of your shoulder. These exercises can also help to relieve pain, numbness, and tingling. If you are unable to do any of the following for any reason, do not further attempt to do it.   Exercise A: Flexion, standing     Stand and hold a broomstick, a cane, or a similar object. Place your hands a little more than shoulder-width apart on the object. Your left / right hand should be palm-up, and your other hand should be palm-down. Push the stick to raise your left / right arm out to your side and then over your head. Use your other hand to help move the stick. Stop when you feel a stretch in your shoulder, or when you reach the angle that is recommended by your health care provider. Avoid shrugging your shoulder while you raise your arm. Keep your shoulder blade tucked down toward your spine. Hold for 30 seconds. Slowly return to the starting position. Repeat 2 times. Complete this exercise 3 times per week.  Exercise B: Abduction, supine     Lie on your back and hold a broomstick, a cane, or a similar object. Place your hands a little more than shoulder-width apart on the  object. Your left / right hand should be palm-up, and your other hand should be palm-down. Push the stick to raise your left / right arm out to your side and then over your head. Use your other hand to help move the stick. Stop when you feel a stretch in your shoulder, or when you reach the angle that is recommended by your health care provider. Avoid shrugging your shoulder while you raise your arm. Keep your shoulder blade tucked down toward your spine. Hold for 30 seconds. Slowly return to the starting position. Repeat 2 times. Complete this exercise 3 times per week.  Exercise C: Flexion, active-assisted     Lie on your back. You may bend your knees for comfort. Hold a broomstick, a cane, or a similar object. Place your hands about shoulder-width apart on the object. Your palms should face toward your feet. Raise the stick and move your arms over your head and behind your head, toward the floor. Use your healthy arm to help your left / right arm move farther. Stop when you feel a gentle stretch in your shoulder, or when you reach the angle where your health care provider tells you to stop. Hold for 30 seconds. Slowly return to the starting position. Repeat 2 times. Complete this exercise 3 times per week.  Exercise D: External rotation and abduction     Stand in a door frame with one  of your feet slightly in front of the other. This is called a staggered stance. Choose one of the following positions as told by your health care provider: Place your hands and forearms on the door frame above your head. Place your hands and forearms on the door frame at the height of your head. Place your hands on the door frame at the height of your elbows. Slowly move your weight onto your front foot until you feel a stretch across your chest and in the front of your shoulders. Keep your head and chest upright and keep your abdominal muscles tight. Hold for 30 seconds. To release the stretch, shift your  weight to your back foot. Repeat 2 times. Complete this stretch 3 times per week.  Strengthening exercises These exercises build strength and endurance in your shoulder. Endurance is the ability to use your muscles for a long time, even after your muscles get tired. Exercise E: Scapular depression and adduction  Sit on a stable chair. Support your arms in front of you with pillows, armrests, or a tabletop. Keep your elbows in line with the sides of your body. Gently move your shoulder blades down toward your middle back. Relax the muscles on the tops of your shoulders and in the back of your neck. Hold for 3 seconds. Slowly release the tension and relax your muscles completely before doing this exercise again. Repeat for a total of 10 repetitions. After you have practiced this exercise, try doing the exercise without the arm support. Then, try the exercise while standing instead of sitting. Repeat 2 times. Complete this exercise 3 times per week.  Exercise F: Shoulder abduction, isometric     Stand or sit about 4-6 inches (10-15 cm) from a wall with your left / right side facing the wall. Bend your left / right elbow and gently press your elbow against the wall. Increase the pressure slowly until you are pressing as hard as you can without shrugging your shoulder. Hold for 3 seconds. Slowly release the tension and relax your muscles completely. Repeat for a total of 10 repetitions. Repeat 2 times. Complete this exercise 3 times per week.  Exercise G: Shoulder flexion, isometric     Stand or sit about 4-6 inches (10-15 cm) away from a wall with your left / right side facing the wall. Keep your left / right elbow straight and gently press the top of your fist against the wall. Increase the pressure slowly until you are pressing as hard as you can without shrugging your shoulder. Hold for 10-15 seconds. Slowly release the tension and relax your muscles completely. Repeat for a total of 10  repetitions. Repeat 2 times. Complete this exercise 3 times per week.  Exercise H: Internal rotation     Sit in a stable chair without armrests, or stand. Secure an exercise band at your left / right side, at elbow height. Place a soft object, such as a folded towel or a small pillow, under your left / right upper arm so your elbow is a few inches (about 8 cm) away from your side. Hold the end of the exercise band so the band stretches. Keeping your elbow pressed against the soft object under your arm, move your forearm across your body toward your abdomen. Keep your body steady so the movement is only coming from your shoulder. Hold for 3 seconds. Slowly return to the starting position. Repeat for a total of 10 repetitions. Repeat 2 times. Complete this exercise 3  times per week.  Exercise I: External rotation     Sit in a stable chair without armrests, or stand. Secure an exercise band at your left / right side, at elbow height. Place a soft object, such as a folded towel or a small pillow, under your left / right upper arm so your elbow is a few inches (about 8 cm) away from your side. Hold the end of the exercise band so the band stretches. Keeping your elbow pressed against the soft object under your arm, move your forearm out, away from your abdomen. Keep your body steady so the movement is only coming from your shoulder. Hold for 3 seconds. Slowly return to the starting position. Repeat for a total of 10 repetitions. Repeat 2 times. Complete this exercise 3 times per week. Exercise J: Shoulder extension  Sit in a stable chair without armrests, or stand. Secure an exercise band to a stable object in front of you so the band is at shoulder height. Hold one end of the exercise band in each hand. Your palms should face each other. Straighten your elbows and lift your hands up to shoulder height. Step back, away from the secured end of the exercise band, until the band  stretches. Squeeze your shoulder blades together and pull your hands down to the sides of your thighs. Stop when your hands are straight down by your sides. Do not let your hands go behind your body. Hold for 3 seconds. Slowly return to the starting position. Repeat for a total of 10 repetitions. Repeat 2 times. Complete this exercise 3 times per week.  Exercise K: Shoulder extension, prone     Lie on your abdomen on a firm surface so your left / right arm hangs over the edge. Hold a 5 lb weight in your hand so your palm faces in toward your body. Your arm should be straight. Squeeze your shoulder blade down toward the middle of your back. Slowly raise your arm behind you, up to the height of the surface that you are lying on. Keep your arm straight. Hold for 3 seconds. Slowly return to the starting position and relax your muscles. Repeat for a total of 10 repetitions. Repeat 2 times. Complete this exercise 3 times per week.   Exercise L: Horizontal abduction, prone  Lie on your abdomen on a firm surface so your left / right arm hangs over the edge. Hold a 5 lb weight in your hand so your palm faces toward your feet. Your arm should be straight. Squeeze your shoulder blade down toward the middle of your back. Bend your elbow so your hand moves up, until your elbow is bent to an "L" shape (90 degrees). With your elbow bent, slowly move your forearm forward and up. Raise your hand up to the height of the surface that you are lying on. Your upper arm should not move, and your elbow should stay bent. At the top of the movement, your palm should face the floor. Hold for 3 seconds. Slowly return to the starting position and relax your muscles. Repeat for a total of 10 repetitions. Repeat 2 times. Complete this exercise 3 times per week.  Exercise M: Horizontal abduction, standing  Sit on a stable chair, or stand. Secure an exercise band to a stable object in front of you so the band is at  shoulder height. Hold one end of the exercise band in each hand. Straighten your elbows and lift your hands straight in front  of you, up to shoulder height. Your palms should face down, toward the floor. Step back, away from the secured end of the exercise band, until the band stretches. Move your arms out to your sides, and keep your arms straight. Hold for 3 seconds. Slowly return to the starting position. Repeat for a total of 10 repetitions. Repeat 2 times. Complete this exercise 3 times per week.  Exercise N: Scapular retraction and elevation  Sit on a stable chair, or stand. Secure an exercise band to a stable object in front of you so the band is at shoulder height. Hold one end of the exercise band in each hand. Your palms should face each other. Sit in a stable chair without armrests, or stand. Step back, away from the secured end of the exercise band, until the band stretches. Squeeze your shoulder blades together and lift your hands over your head. Keep your elbows straight. Hold for 3 seconds. Slowly return to the starting position. Repeat for a total of 10 repetitions. Repeat 2 times. Complete this exercise 3 times per week.  This information is not intended to replace advice given to you by your health care provider. Make sure you discuss any questions you have with your health care provider. Document Released: 06/08/2005 Document Revised: 02/13/2016 Document Reviewed: 04/25/2015 Elsevier Interactive Patient Education  2017 [employer].

## 2021-01-10 ENCOUNTER — Ambulatory Visit: Payer: PPO

## 2021-01-10 ENCOUNTER — Other Ambulatory Visit: Payer: Self-pay

## 2021-01-10 ENCOUNTER — Ambulatory Visit
Admission: RE | Admit: 2021-01-10 | Discharge: 2021-01-10 | Disposition: A | Discharge status: Home / Self Care | Payer: PPO | Source: Ambulatory Visit | Attending: Family Medicine | Admitting: Family Medicine

## 2021-01-10 DIAGNOSIS — Z9012 Acquired absence of left breast and nipple: Secondary | ICD-10-CM

## 2021-01-10 DIAGNOSIS — Z1231 Encounter for screening mammogram for malignant neoplasm of breast: Secondary | ICD-10-CM

## 2021-01-10 IMAGING — MG MM DIGITAL SCREENING UNILAT*R* W/ TOMO W/ CAD
4 series · 4 of 12 positions shown · non-contrast
Comparison: Previous exam(s).

CLINICAL DATA: Screening.

EXAM:
DIGITAL SCREENING UNILATERAL RIGHT MAMMOGRAM WITH CAD AND
TOMOSYNTHESIS
TECHNIQUE: Right screening digital craniocaudal and mediolateral oblique
mammograms were obtained. Right screening digital breast
tomosynthesis was performed. The images were evaluated with
computer-aided detection.

[R CC synth-2D]
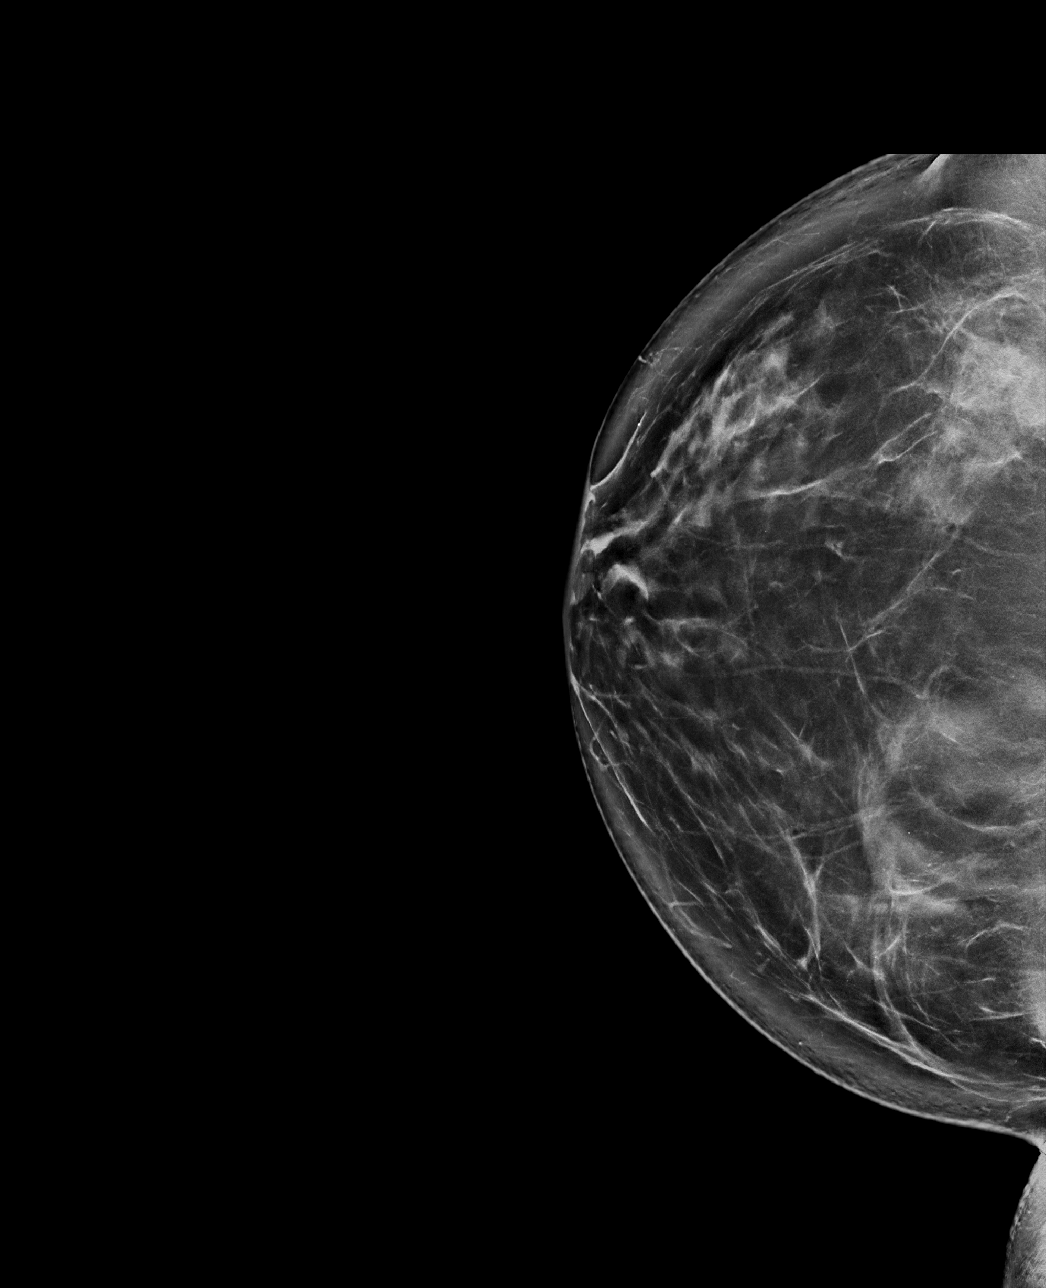

[R MLO synth-2D]
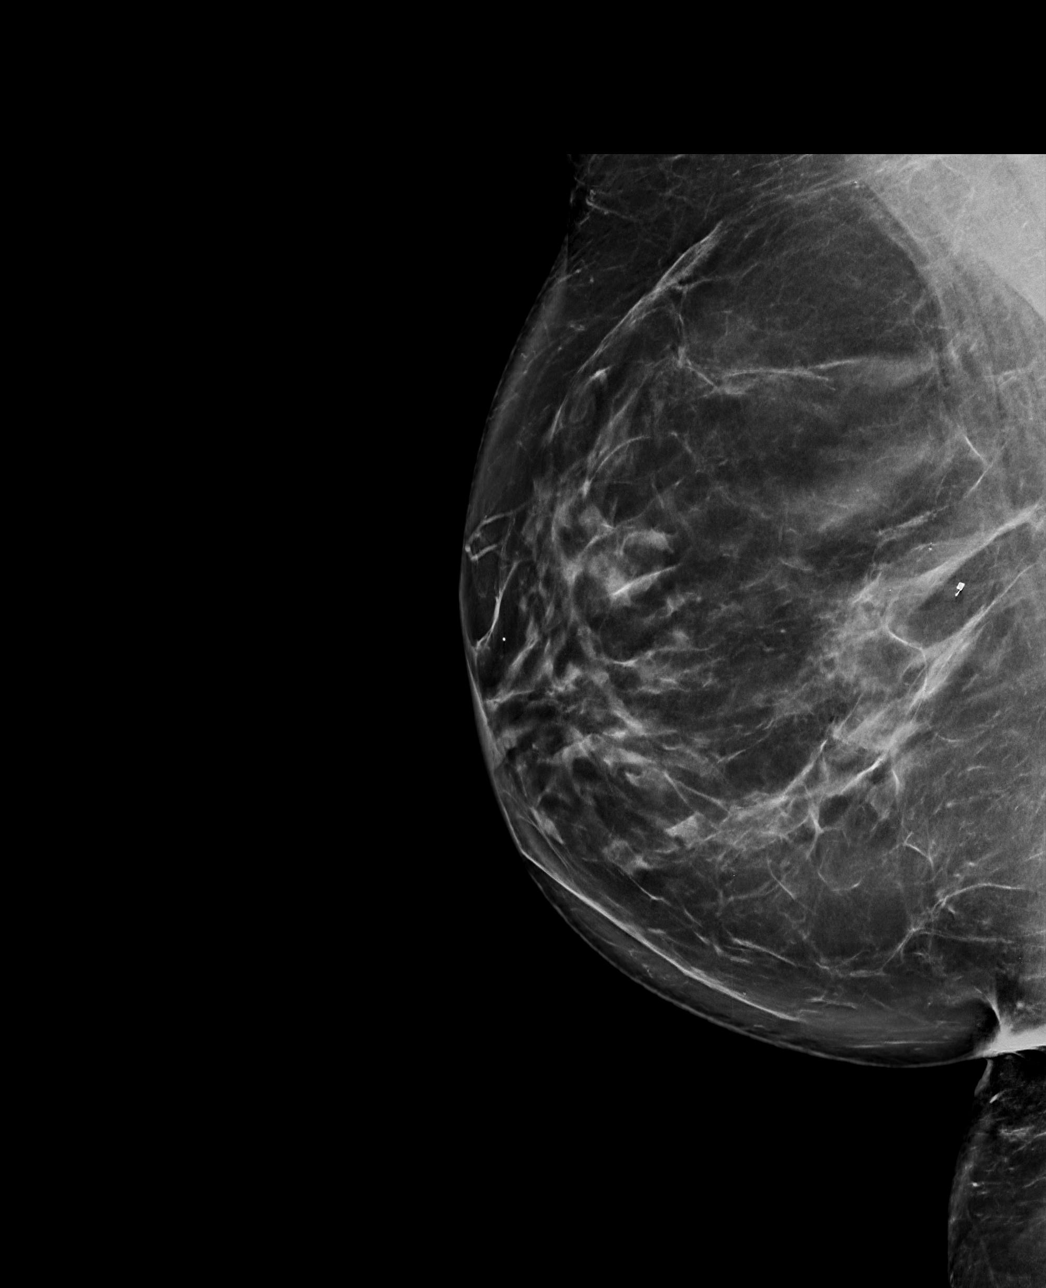

[R MLO tomo · tomo slice 53/105.0]
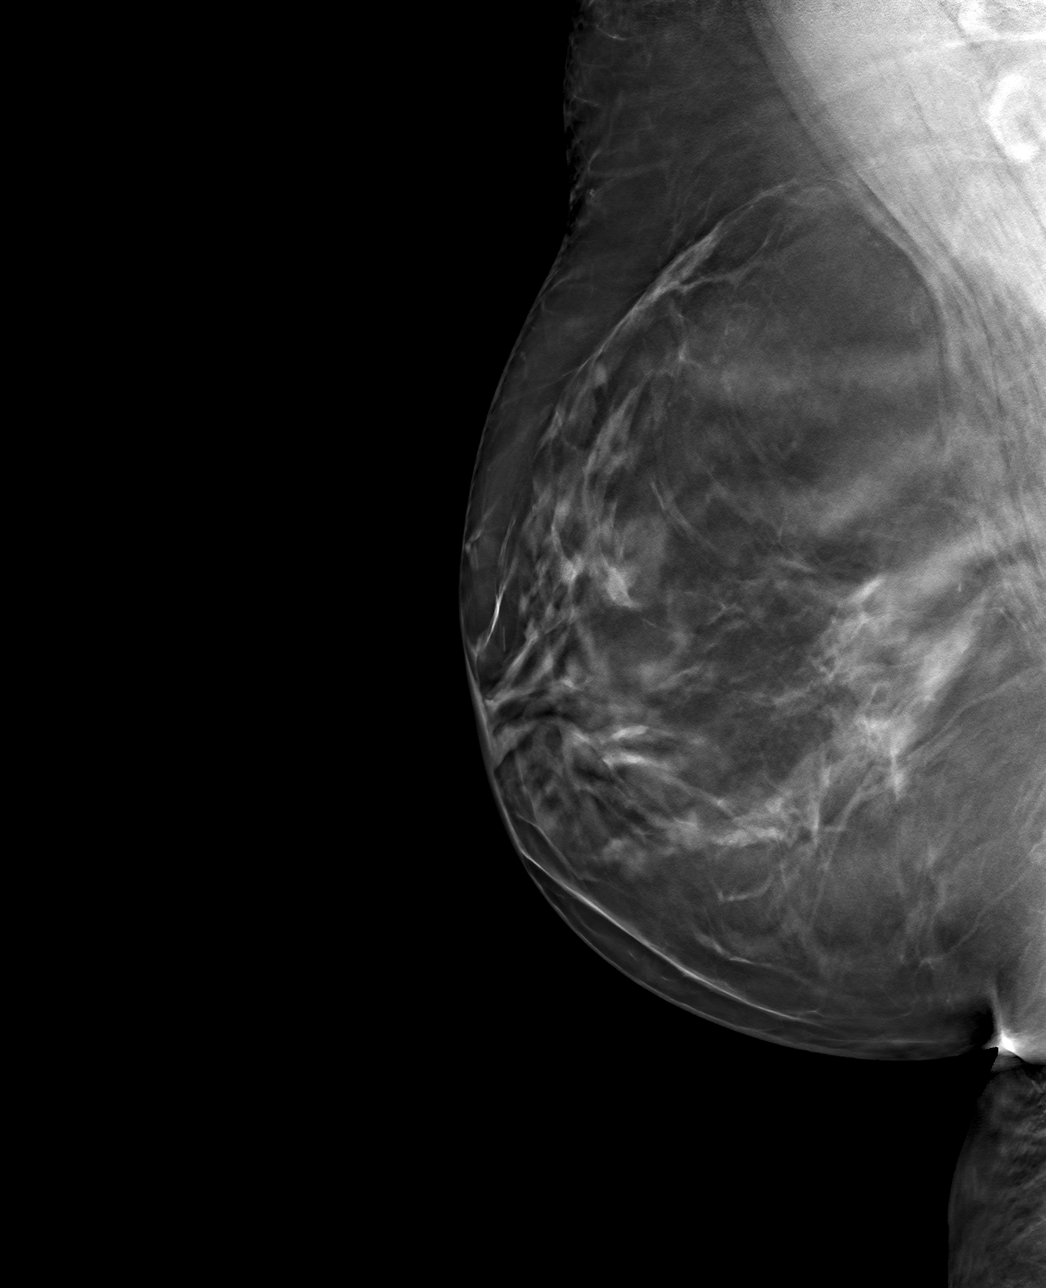

[R CC tomo · tomo slice 51/100.0]
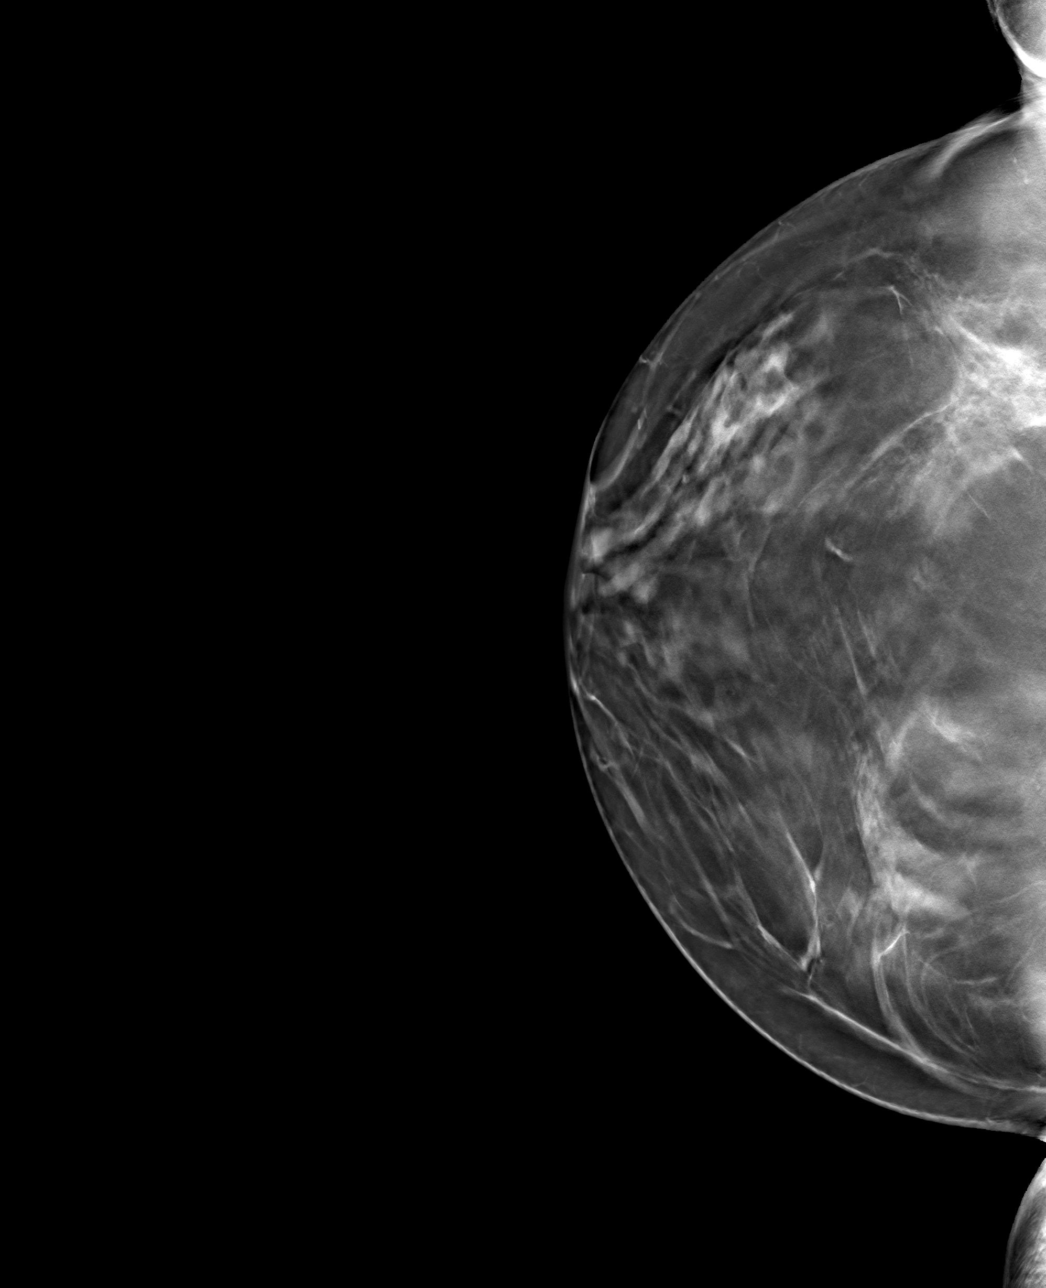

[4 of 12 positions shown; findings below may reference images not displayed]

ACR Breast Density Category c: The breast tissue is heterogeneously
dense, which may obscure small masses.
FINDINGS: There are no findings suspicious for malignancy.
IMPRESSION: No mammographic evidence of malignancy. A result letter of this
screening mammogram will be mailed directly to the patient.

RECOMMENDATION:
Screening mammogram in one year. (Code:[X5])

BI-RADS CATEGORY  1: Negative.

## 2021-01-22 ENCOUNTER — Encounter: Payer: Self-pay | Admitting: Family Medicine

## 2021-01-22 ENCOUNTER — Other Ambulatory Visit: Payer: Self-pay

## 2021-01-22 ENCOUNTER — Ambulatory Visit: Payer: PPO | Admitting: Family Medicine

## 2021-01-22 VITALS — BP 124/70 | HR 94 | Ht 67.0 in | Wt 221.0 lb

## 2021-01-22 DIAGNOSIS — M546 Pain in thoracic spine: Secondary | ICD-10-CM | POA: Diagnosis not present

## 2021-01-22 DIAGNOSIS — Z8249 Family history of ischemic heart disease and other diseases of the circulatory system: Secondary | ICD-10-CM | POA: Diagnosis not present

## 2021-01-22 DIAGNOSIS — M549 Dorsalgia, unspecified: Secondary | ICD-10-CM

## 2021-01-22 DIAGNOSIS — R079 Chest pain, unspecified: Secondary | ICD-10-CM | POA: Diagnosis not present

## 2021-01-22 DIAGNOSIS — G8929 Other chronic pain: Secondary | ICD-10-CM

## 2021-01-22 NOTE — Patient Instructions (Signed)
Good to see you  MRI thoracic spine ordered  Referral to cardiology  Scapular exercises given  See me again in 4-5 weeks

## 2021-01-22 NOTE — Progress Notes (Signed)
[place] [facility] [place] Phone: [phone] Subjective:   Tonya [name] [name] [name], am serving as a scribe for Dr. Hulan [name].  This visit occurred during the SARS-CoV-2 public health emergency.  Safety protocols were in place, including screening questions prior to the visit, additional usage of staff PPE, and extensive cleaning of exam room while observing appropriate contact time as indicated for disinfecting solutions.    I'm seeing this patient by the request  of:  Tonya [name] Pal, DO  CC: Left-sided thoracic pain  [id number]  Tonya [name] [name] [name] is a 70 y.o. female coming in with complaint of thoracic spine pain for 1 year. Pain is worse when she is on her feet or walking. Also has pain with cooking. Once a week or every other week she will have muscle spasms in which she will have sharp pain with rotation. Spasm will radiate up into lower cervical spine. She also notices tenderness in upper thoracic spine. Does mention new breast implant that was put in last year. Tends to sleep on L side. Patient also notes numbness in L arm and pinky on occasion. Did try Celebrex but did not feel that this was helpful due to side effects. Also tried PT last year which did help.   DG thoracic 12/25/2020 IMPRESSION: 1. Mild multilevel degenerative changes.    DG ribs 03/05/2020 Negative      Past Medical History:  Diagnosis Date   Hypertension    Osteopenia    Past Surgical History:  Procedure Laterality Date   ABDOMINAL HYSTERECTOMY  2015   AUGMENTATION MAMMAPLASTY Left    BREAST BIOPSY Right 02/2019   BREAST IMPLANT EXCHANGE     MASTECTOMY Left    REDUCTION MAMMAPLASTY Right    TONSILLECTOMY AND ADENOIDECTOMY  1958   Social History   Socioeconomic History   Marital status: Married    Spouse name: Not on file   Number of children: Not on file   Years of education: Not on file   Highest education level: Not on file   Occupational History   Not on file  Tobacco Use   Smoking status: Never   Smokeless tobacco: Never  Vaping Use   Vaping Use: Never used  Substance and Sexual Activity   Alcohol use: Not on file    Comment: rarely   Drug use: Never   Sexual activity: Yes    Birth control/protection: None  Other Topics Concern   Not on file  Social History Narrative   Not on file   Social Determinants of Health   Financial Resource Strain: Not on file  Food Insecurity: Not on file  Transportation Needs: Not on file  Physical Activity: Not on file  Stress: Not on file  Social Connections: Not on file   Tonya [name] [name] Known Allergies Family History  Problem Relation Age of Onset   Heart attack Father        died from   Breast cancer Maternal Aunt    Breast cancer Paternal Aunt      Current Outpatient Medications (Cardiovascular):    losartan (COZAAR) 50 MG tablet, Take 1 tablet (50 mg total) by mouth daily.   metoprolol succinate (TOPROL-XL) 25 MG 24 hr tablet, Take 1 tablet (25 mg total) by mouth daily.  Current Outpatient Medications (Respiratory):    montelukast (SINGULAIR) 10 MG tablet, Take 1 tablet (10 mg total) by mouth at bedtime.  Current Outpatient Medications (Analgesics):    naproxen (EC NAPROSYN)  500 MG EC tablet, Take 1 tablet (500 mg total) by mouth 2 (two) times daily with a meal.   Current Outpatient Medications (Other):    calcium-vitamin D (OSCAL WITH D) 250-125 MG-UNIT tablet, Take 1 tablet by mouth daily.   pantoprazole (PROTONIX) 20 MG tablet, Take 1 tablet (20 mg total) by mouth daily.   valACYclovir (VALTREX) 1000 MG tablet, Take 1 tablet (1,000 mg total) by mouth 2 (two) times daily.   Reviewed prior external information including notes and imaging from  primary care provider As well as notes that were available from care everywhere and other healthcare systems.  Past medical history, social, surgical and family history all reviewed in electronic medical record.   Tonya [name] [name] pertanent information unless stated regarding to the chief complaint.   Review of Systems:  Tonya [name] [name] headache, visual changes, nausea, vomiting, diarrhea, constipation, dizziness, abdominal pain, skin rash, fevers, chills, night sweats, weight loss, swollen lymph nodes, body aches, joint swelling, chest pain, shortness of breath, mood changes. POSITIVE muscle aches  Objective  Blood pressure 124/70, pulse 94, height '5\' 7"'$  (1.702 m), weight 221 lb (100.2 kg), SpO2 98 %.   General: Tonya [name] [name] apparent distress alert and oriented x3 mood and affect normal, dressed appropriately.  HEENT: Pupils equal, extraocular movements intact  Respiratory: Patient's speak in full sentences and does not appear short of breath  Cardiovascular: Trace lower extremity edema, non tender, Tonya [name] [name] erythema  Gait normal with good balance and coordination.  MSK: Mild arthritic changes noted as well.  Patient has unfortunately an increase in kyphosis.  Does have scapular dyskinesis noted on the left side.  Patient does have tenderness to palpation in the parascapular region on the medial aspect of the left shoulder.  Tonya [name] [name] masses appreciated.  Pain does seem to be minorly out of proportion to the amount of palpation.    Impression and Recommendations:     The above documentation has been reviewed and is accurate and complete Tonya [name] Pulley, DO

## 2021-01-22 NOTE — Assessment & Plan Note (Addendum)
Patient continues to have a chronic side pain.  Seems to be more of a scapular dyskinesis.  Given some home exercises that I think will be beneficial.  We discussed with patient about the possibility of formal physical therapy but patient states that she did go to physical therapy for this for a year.  Has had multiple different medications including muscle relaxers as well as gabapentin with no significant benefit.  Patient patient did not want to repeat these at the moment.  I do feel because of the longevity of this as well as history of breast cancer then an MRI of the thoracic spine can be beneficial.  Patient is significantly anxious that something else was going on.  We will get the MRI but likely depending on this then we will discuss other treatment options.  In addition to this we have reviewed patient's chart previously.  Patient has had an abnormal stress echocardiogram previously but nuclear stress test was unremarkable in 2015.  Patient though is having pain that does seem to be worse with activity and gets better with rest.  Still seems to be more muscular but I do think that referral to cardiology would be beneficial with what appears of a family history of her mother dying from a heart attack.  Patient given red flags and when to seek medical attention or going to the emergency room.  Patient does have some underlying anxiety that I think is also contributing and we will discuss with patient primary care depending on further work-up.

## 2021-01-23 ENCOUNTER — Ambulatory Visit
Admission: RE | Admit: 2021-01-23 | Discharge: 2021-01-23 | Disposition: A | Discharge status: Home / Self Care | Payer: PPO | Source: Ambulatory Visit | Attending: Family Medicine | Admitting: Family Medicine

## 2021-01-23 DIAGNOSIS — M549 Dorsalgia, unspecified: Secondary | ICD-10-CM

## 2021-01-23 DIAGNOSIS — M5023 Other cervical disc displacement, cervicothoracic region: Secondary | ICD-10-CM | POA: Diagnosis not present

## 2021-01-23 DIAGNOSIS — M47814 Spondylosis without myelopathy or radiculopathy, thoracic region: Secondary | ICD-10-CM | POA: Diagnosis not present

## 2021-01-23 DIAGNOSIS — M5124 Other intervertebral disc displacement, thoracic region: Secondary | ICD-10-CM | POA: Diagnosis not present

## 2021-01-23 IMAGING — MR MR THORACIC SPINE W/O CM
4 of 6 series · 23 of 48 positions shown · non-contrast
Comparison: X-ray [DATE]

CLINICAL DATA: Mid back pain

EXAM:
MRI THORACIC SPINE WITHOUT CONTRAST
TECHNIQUE: Multiplanar, multisequence MR imaging of the thoracic spine was
performed. No intravenous contrast was administered.

[Series 17: T1 · sagittal · 3.0mm · 0.89mm/px · 5 of 15 slices shown]
[im 1/15]
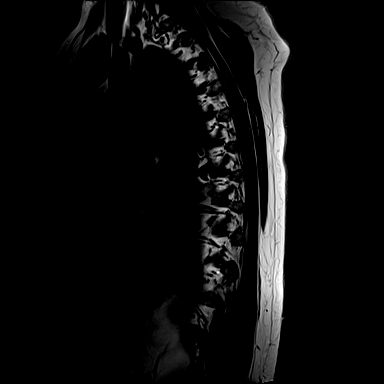
[im 4/15]
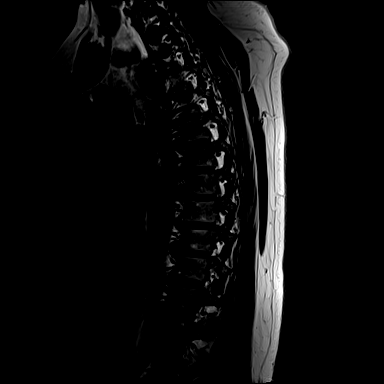
[im 8/15]
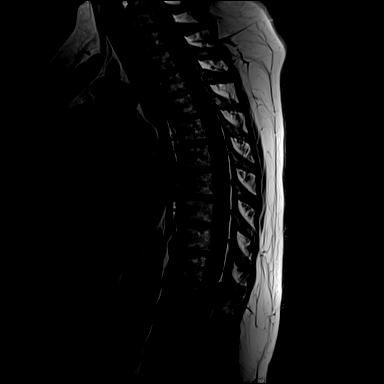
[im 11/15]
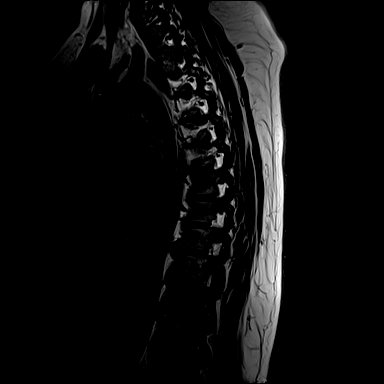
[im 15/15]
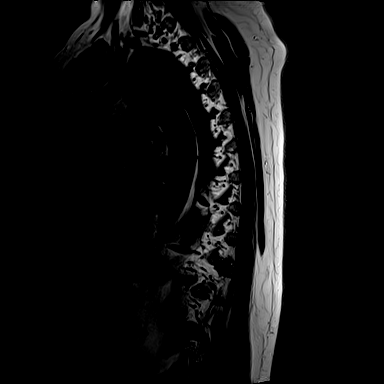

[Series 18: STIR · sagittal · 3.0mm · 1.06mm/px · 5 of 15 slices shown]
[im 1/15]
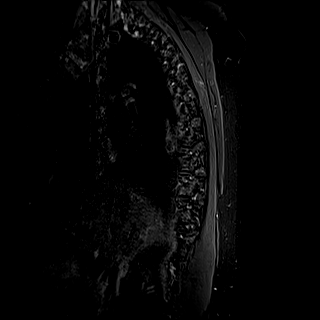
[im 4/15]
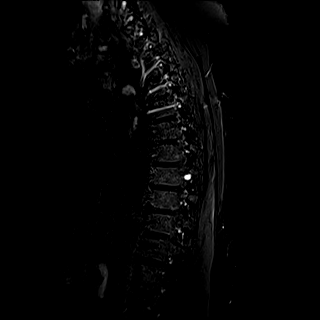
[im 8/15]
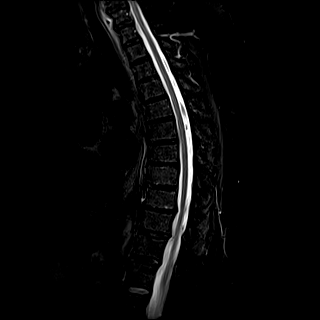
[im 11/15]
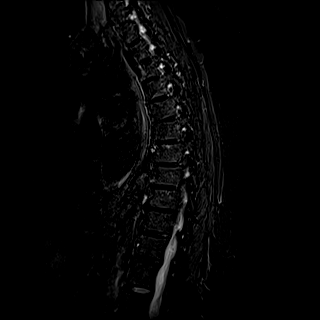
[im 15/15]
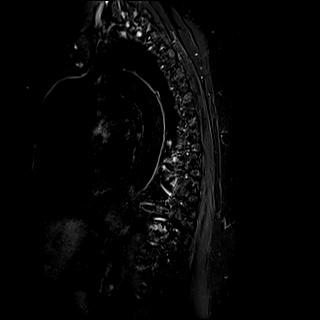

[Series 19: T2 · sagittal · 3.0mm · 0.89mm/px · 5 of 15 slices shown (1 of 2)]
[im 1/15]
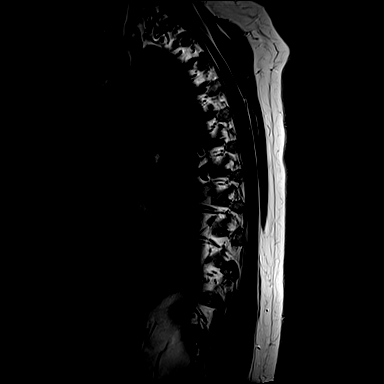
[im 4/15]
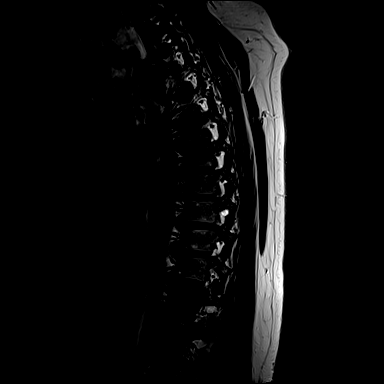
[im 8/15]
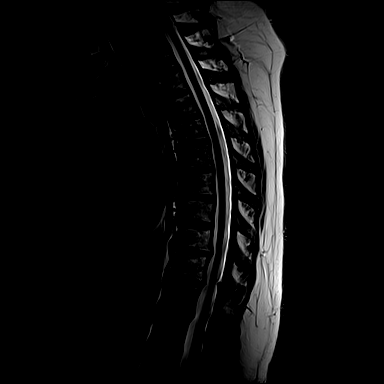
[im 11/15]
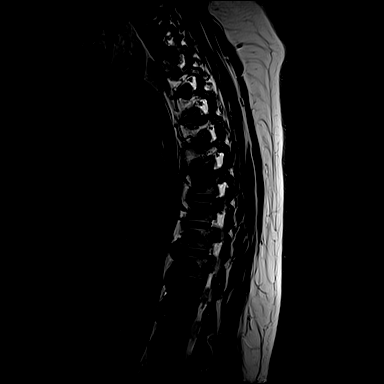
[im 15/15]
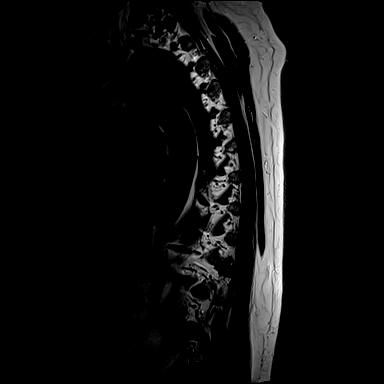

[Series 20: T2 · axial · 4.0mm · 0.28mm/px · z∈[-320,-77]mm · 8 of 39 slices shown (2 of 2)]
[im 1/39]
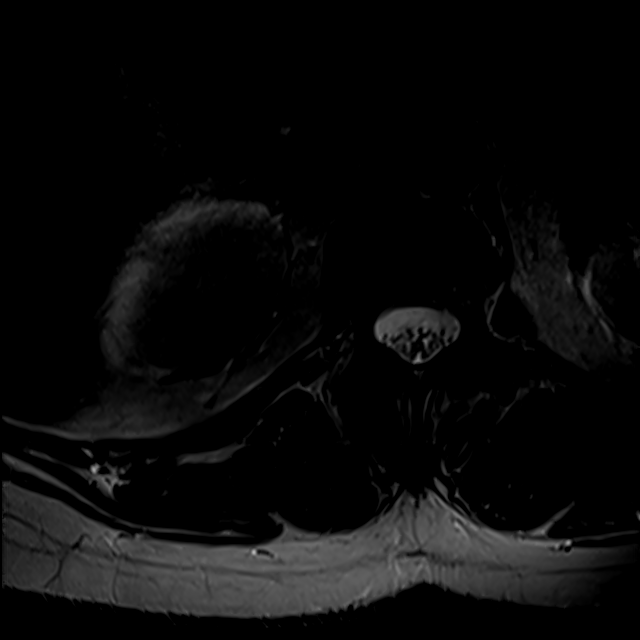
[im 6/39]
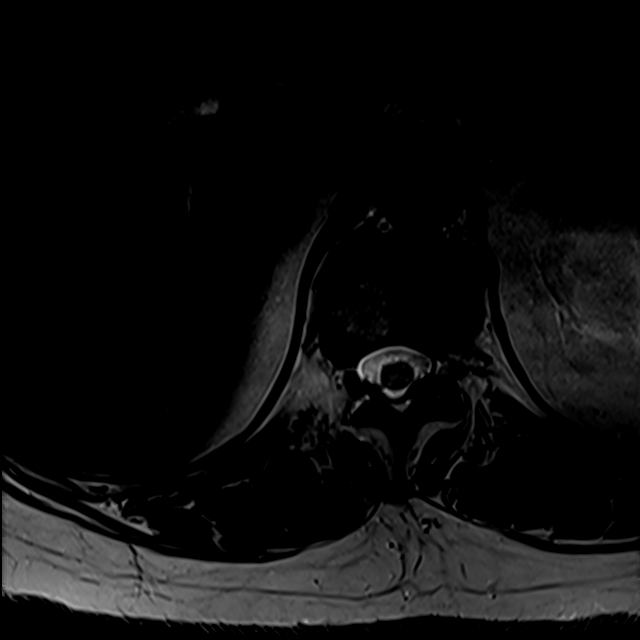
[im 12/39]
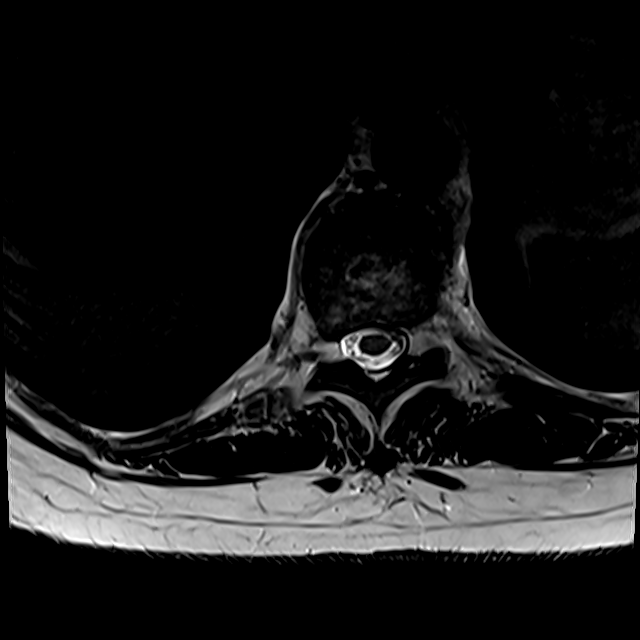
[im 18/39]
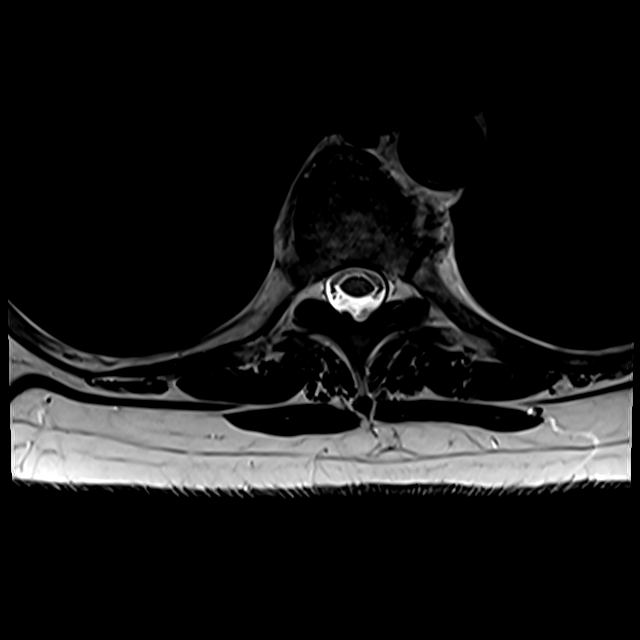
[im 21/39]
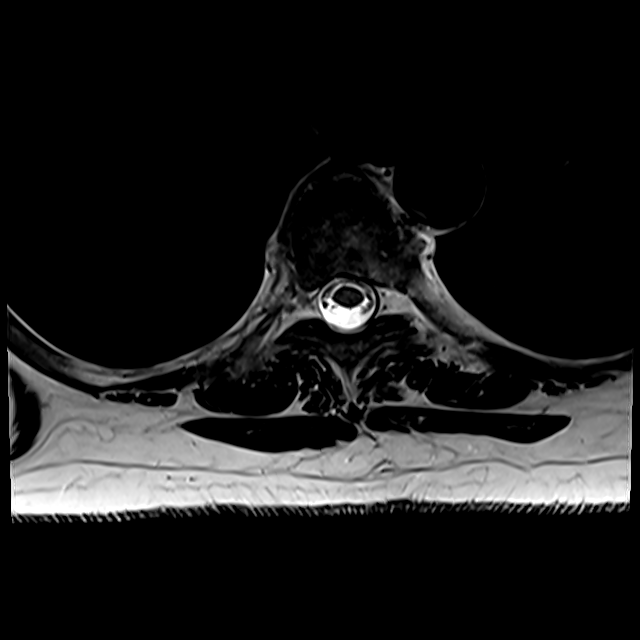
[im 27/39]
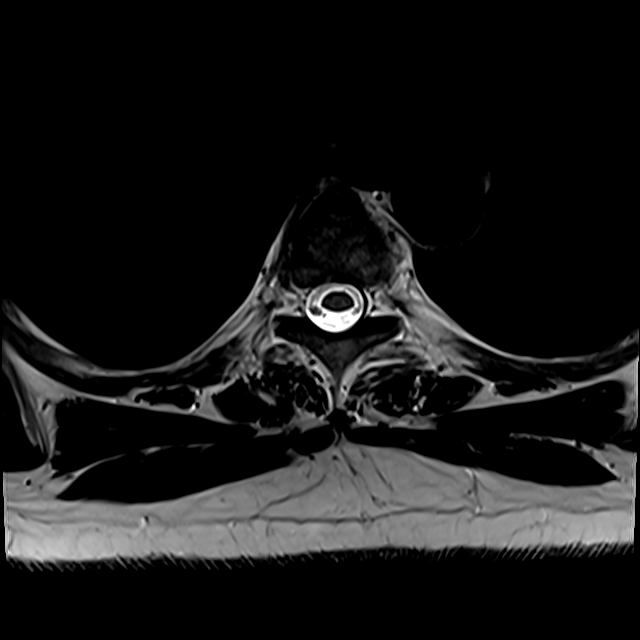
[im 33/39]
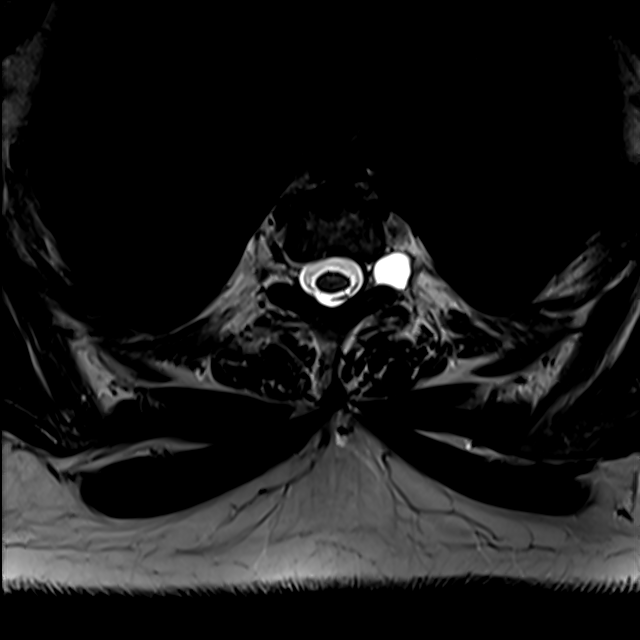
[im 39/39]
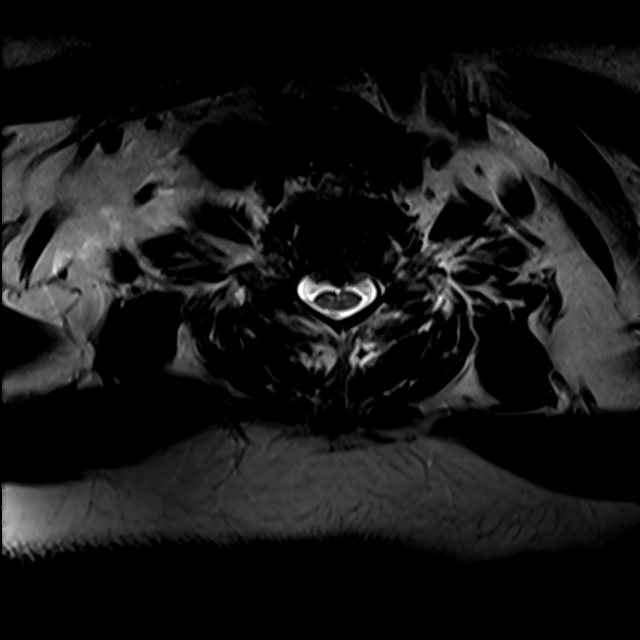

[23 of 48 positions shown; findings below may reference images not displayed]

FINDINGS: Alignment:  Physiologic.

Vertebrae: No fracture, evidence of discitis, or bone lesion. Mild
discogenic endplate marrow changes anteriorly at T1-2.

Cord:  Normal signal and morphology.

Paraspinal and other soft tissues: Negative.

Disc levels:

C7-T1: Small central disc protrusion contacts the ventral cord
without significant canal stenosis. No foraminal stenosis.

T1-T2: Tiny right paracentral disc protrusion. No foraminal or canal
stenosis.

T2-T3: Unremarkable disc. Incidentally noted 11 mm left foraminal
perineural cyst. No foraminal or canal stenosis.

T3-T4: Unremarkable.

T4-T5: Unremarkable.

T5-T6: Unremarkable.

T6-T7: Tiny left paracentral disc protrusion. No foraminal or canal
stenosis.

T7-T8: Unremarkable.

T8-T9: Unremarkable.

T9-T10: Mild disc bulge.  No foraminal or canal stenosis.

T10-T11: No disc protrusion. Mild bilateral facet arthropathy. No
foraminal or canal stenosis.

T11-T12: Mild disc bulge. Mild bilateral facet arthropathy. No
foraminal or canal stenosis.
IMPRESSION: 1. No acute osseous abnormality of the thoracic spine.
2. Mild multilevel thoracic spondylosis with scattered
noncompressive disc protrusions. No foraminal or canal stenosis at
any level.

## 2021-01-26 NOTE — Progress Notes (Signed)
Cardiology Office Note:    Date:  01/30/2021   ID:  Tonya [name], DOB [birth year], MRN [id number]  PCP:  [name], DO  Cardiologist:  None  Electrophysiologist:  None   Referring MD: Lyndal Pulley, DO   Chief Complaint  Patient presents with   Chest Pain    History of Present Illness:    Tonya [name] is a 70 y.o. female with a hx of hypertension who is referred by Dr. Tamala Julian for evaluation of chest pain.  She previously followed with Dr. Lubertha Basque in cardiology at [facility], last seen in 2015.  Underwent a stress EKG which showed ST depressions.  Nuclear stress test was ordered (do not have report) but she states was told was normal.   She reports that she has been having heaviness on the left side of her chest.  Typically occurs when lying down at night, happens weekly to monthly.  Also reports episodes of pain in her neck/shoulder/back.  She has not been exercising since foot surgery last year.  Reports she gets short of breath with exertion but denies any clear relationship with exertion and chest pain.  Reports BP typically 120s over 70s.  Reports occasional lightheadedness but denies any syncope.  Reports occasional lower extremity edema.  Reports she has been having leg pain/cramping in left leg, can occur with exertion but also notices it at night.  Denies any heart palpitations.  No smoking history.  Family history includes mother died of MI at age 73, brother died of MI at age 78, father died of MI at age 40.  Past Medical History:  Diagnosis Date   Hypertension    Osteopenia     Past Surgical History:  Procedure Laterality Date   ABDOMINAL HYSTERECTOMY  2015   AUGMENTATION MAMMAPLASTY Left    BREAST BIOPSY Right 02/2019   BREAST IMPLANT EXCHANGE     MASTECTOMY Left    REDUCTION MAMMAPLASTY Right    TONSILLECTOMY AND ADENOIDECTOMY  1958    Current Medications: Current Meds  Medication Sig   calcium-vitamin D (OSCAL WITH D) 250-125 MG-UNIT tablet  Take 1 tablet by mouth daily.   losartan (COZAAR) 50 MG tablet Take 1 tablet (50 mg total) by mouth daily.   metoprolol succinate (TOPROL-XL) 25 MG 24 hr tablet Take 1 tablet (25 mg total) by mouth daily.   metoprolol tartrate (LOPRESSOR) 100 MG tablet Take 100 mg (1 tablet) TWO hours prior to CT scan-HOLD TOPROL XL   montelukast (SINGULAIR) 10 MG tablet Take 1 tablet (10 mg total) by mouth at bedtime.   pantoprazole (PROTONIX) 20 MG tablet Take 1 tablet (20 mg total) by mouth daily.     Allergies:   Patient has no known allergies.   Social History   Socioeconomic History   Marital status: Married    Spouse name: Not on file   Number of children: Not on file   Years of education: Not on file   Highest education level: Not on file  Occupational History   Not on file  Tobacco Use   Smoking status: Never   Smokeless tobacco: Never  Vaping Use   Vaping Use: Never used  Substance and Sexual Activity   Alcohol use: Not on file    Comment: rarely   Drug use: Never   Sexual activity: Yes    Birth control/protection: None  Other Topics Concern   Not on file  Social History Narrative   Not on file   Social Determinants  of Health   Financial Resource Strain: Not on file  Food Insecurity: Not on file  Transportation Needs: Not on file  Physical Activity: Not on file  Stress: Not on file  Social Connections: Not on file     Family History: The patient's family history includes Breast cancer in her maternal aunt and paternal aunt; Heart attack in her father.  ROS:   Please see the history of present illness.     All other systems reviewed and are negative.  EKGs/Labs/Other Studies Reviewed:    The following studies were reviewed today:   EKG:  EKG is ordered today.  The ekg ordered today demonstrates normal sinus rhythm, rate 75, no ST/T abnormality  Recent Labs: 09/20/2020: ALT 27; Hemoglobin 14.2; Platelets 181.0; TSH 1.31 01/29/2021: BUN 15; Creatinine, Ser 0.84;  Potassium 5.0; Sodium 142  Recent Lipid Panel    Component Value Date/Time   CHOL 213 (H) 09/20/2020 1129   TRIG 85.0 09/20/2020 1129   HDL 60.70 09/20/2020 1129   CHOLHDL 4 09/20/2020 1129   VLDL 17.0 09/20/2020 1129   LDLCALC 135 (H) 09/20/2020 1129    Physical Exam:    VS:  BP 132/82   Pulse 75   Resp 18   Ht '5\' 7"'$  (1.702 m)   Wt 224 lb 6.4 oz (101.8 kg)   SpO2 99%   BMI 35.15 kg/m     Wt Readings from Last 3 Encounters:  01/29/21 224 lb 6.4 oz (101.8 kg)  01/22/21 221 lb (100.2 kg)  12/30/20 223 lb 4 oz (101.3 kg)     GEN:  Well nourished, well developed in no acute distress HEENT: Normal NECK: No JVD; No carotid bruits LYMPHATICS: No lymphadenopathy CARDIAC: RRR, no murmurs, rubs, gallops RESPIRATORY:  Clear to auscultation without rales, wheezing or rhonchi  ABDOMEN: Soft, non-tender, non-distended MUSCULOSKELETAL:  No edema; No deformity  SKIN: Warm and dry NEUROLOGIC:  Alert and oriented x 3 PSYCHIATRIC:  Normal affect   ASSESSMENT:    1. Chest pain, unspecified type   2. DOE (dyspnea on exertion)   3. Pain of left lower extremity   4. Essential hypertension   5. Hyperlipidemia, unspecified hyperlipidemia type    PLAN:    Chest pain: Atypical in description but does have CAD risk factors (age, hypertension, family history).  Also reports dyspnea with exertion that could represent anginal equivalent.  Overall classify as intermediate risk of obstructive CAD and further evaluation is warranted -Coronary CTA.  Hold home Toprol-XL and will give Lopressor 100 mg prior to study -Echocardiogram  Left leg pain/cramping: Check ABIs to evaluate for PAD  Hypertension: On losartan 50 mg daily, Toprol-XL 25 mg daily.  Appears controlled  Hyperlipidemia: LDL 135 on 09/20/2020.  We will follow-up results of coronary CTA to guide how aggressive to be lowering cholesterol  RTC in 6 months  Medication Adjustments/Labs and Tests Ordered: Current medicines are  reviewed at length with the patient today.  Concerns regarding medicines are outlined above.  Orders Placed This Encounter  Procedures   CT CORONARY MORPH W/CTA COR W/SCORE W/CA W/CM &/OR WO/CM   Basic metabolic panel   EKG [id number]   ECHOCARDIOGRAM COMPLETE   VAS [place] ABI WITH/WO TBI   VAS [place] LOWER EXTREMITY ARTERIAL DUPLEX    Meds ordered this encounter  Medications   metoprolol tartrate (LOPRESSOR) 100 MG tablet    Sig: Take 100 mg (1 tablet) TWO hours prior to CT scan-HOLD TOPROL XL    Dispense:  1  tablet    Refill:  0     Patient Instructions  Medication Instructions:  Your physician recommends that you continue on your current medications as directed. Please refer to the Current Medication list given to you today.  *If you need a refill on your cardiac medications before your next appointment, please call your pharmacy*   Lab Work: BMET today  If you have labs (blood work) drawn today and your tests are completely normal, you will receive your results only by: [place] (if you have MyChart) OR A paper copy in the mail If you have any lab test that is abnormal or we need to change your treatment, we will call you to review the results.   Testing/Procedures: Coronary CTA-see instructions below  Your physician has requested that you have an echocardiogram. Echocardiography is a painless test that uses sound waves to create images of your heart. It provides your doctor with information about the size and shape of your heart and how well your heart's chambers and valves are working. This procedure takes approximately one hour. There are no restrictions for this procedure.  Your physician has requested that you have an ankle brachial index (ABI). During this test an ultrasound and blood pressure cuff are used to evaluate the arteries that supply the arms and legs with blood. Allow thirty minutes for this exam. There are no restrictions or special  instructions.  Follow-Up: At [facility], you and your health needs are our priority.  As part of our continuing mission to provide you with exceptional heart care, we have created designated Provider Care Teams.  These Care Teams include your primary Cardiologist (physician) and Advanced Practice Providers (APPs -  Physician Assistants and Nurse Practitioners) who all work together to provide you with the care you need, when you need it.  We recommend signing up for the patient portal called "MyChart".  Sign up information is provided on this After Visit Summary.  MyChart is used to connect with patients for Virtual Visits (Telemedicine).  Patients are able to view lab/test results, encounter notes, upcoming appointments, etc.  Non-urgent messages can be sent to your provider as well.   To learn more about what you can do with MyChart, go to [email or web address].    Your next appointment:   6 month(s)  The format for your next appointment:   In Person  Provider:   Oswaldo Milian, MD   Other Instructions   Your cardiac CT will be scheduled at one of the below locations:   [facility] [address] [place], [place] [address] [phone]  If scheduled at [facility], please arrive at the [facility] main entrance (entrance A) of [facility] 30 minutes prior to test start time. Proceed to the [facility] Radiology Department (first floor) to check-in and test prep.  Please follow these instructions carefully (unless otherwise directed):  On the Night Before the Test: Be sure to Drink plenty of water. Do not consume any caffeinated/decaffeinated beverages or chocolate 12 hours prior to your test. Do not take any antihistamines 12 hours prior to your test.  On the Day of the Test: Drink plenty of water until 1 hour prior to the test. Do not eat any food 4 hours prior to the test. You may take your regular medications prior to the test.   HOLD Toprol XL day of test Take metoprolol (Lopressor) two hours prior to test. FEMALES- please wear underwire-free bra if available, avoid dresses &  tight clothing      After the Test: Drink plenty of water. After receiving IV contrast, you may experience a mild flushed feeling. This is normal. On occasion, you may experience a mild rash up to 24 hours after the test. This is not dangerous. If this occurs, you can take Benadryl 25 mg and increase your fluid intake. If you experience trouble breathing, this can be serious. If it is severe call 911 IMMEDIATELY. If it is mild, please call our office. If you take any of these medications: Glipizide/Metformin, Avandament, Glucavance, please do not take 48 hours after completing test unless otherwise instructed.  Please allow 2-4 weeks for scheduling of routine cardiac CTs. Some insurance companies require a pre-authorization which may delay scheduling of this test.   For non-scheduling related questions, please contact the cardiac imaging nurse navigator should you have any questions/concerns: Marchia Bond, Cardiac Imaging Nurse Navigator Gordy Clement, Cardiac Imaging Nurse Navigator [facility] Direct Office Dial: [phone]   For scheduling needs, including cancellations and rescheduling, please call [place], [phone].    Signed, Donato Heinz, MD  01/30/2021 7:25 AM    [facility]

## 2021-01-27 ENCOUNTER — Encounter: Payer: Self-pay | Admitting: Family Medicine

## 2021-01-29 ENCOUNTER — Encounter: Payer: Self-pay | Admitting: Cardiology

## 2021-01-29 ENCOUNTER — Ambulatory Visit: Payer: PPO | Admitting: Cardiology

## 2021-01-29 ENCOUNTER — Other Ambulatory Visit: Payer: Self-pay

## 2021-01-29 VITALS — BP 132/82 | HR 75 | Resp 18 | Ht 67.0 in | Wt 224.4 lb

## 2021-01-29 DIAGNOSIS — I10 Essential (primary) hypertension: Secondary | ICD-10-CM

## 2021-01-29 DIAGNOSIS — R0600 Dyspnea, unspecified: Secondary | ICD-10-CM | POA: Diagnosis not present

## 2021-01-29 DIAGNOSIS — M79605 Pain in left leg: Secondary | ICD-10-CM | POA: Diagnosis not present

## 2021-01-29 DIAGNOSIS — E785 Hyperlipidemia, unspecified: Secondary | ICD-10-CM

## 2021-01-29 DIAGNOSIS — R0609 Other forms of dyspnea: Secondary | ICD-10-CM

## 2021-01-29 DIAGNOSIS — R079 Chest pain, unspecified: Secondary | ICD-10-CM

## 2021-01-29 LAB — BASIC METABOLIC PANEL
BUN/Creatinine Ratio: 18 (ref 12–28)
BUN: 15 mg/dL (ref 8–27)
CO2: 24 mmol/L (ref 20–29)
Calcium: 9.9 mg/dL (ref 8.7–10.3)
Chloride: 102 mmol/L (ref 96–106)
Creatinine, Ser: 0.84 mg/dL (ref 0.57–1.00)
Glucose: 77 mg/dL (ref 65–99)
Potassium: 5 mmol/L (ref 3.5–5.2)
Sodium: 142 mmol/L (ref 134–144)
eGFR: 75 mL/min/{1.73_m2} (ref >59)

## 2021-01-29 MED ORDER — METOPROLOL TARTRATE 100 MG PO TABS: ORAL_TABLET | ORAL | 0 refills | Status: DC

## 2021-01-29 NOTE — Patient Instructions (Signed)
Medication Instructions:  Your physician recommends that you continue on your current medications as directed. Please refer to the Current Medication list given to you today.  *If you need a refill on your cardiac medications before your next appointment, please call your pharmacy*   Lab Work: BMET today  If you have labs (blood work) drawn today and your tests are completely normal, you will receive your results only by: [place] (if you have MyChart) OR A paper copy in the mail If you have any lab test that is abnormal or we need to change your treatment, we will call you to review the results.   Testing/Procedures: Coronary CTA-see instructions below  Your physician has requested that you have an echocardiogram. Echocardiography is a painless test that uses sound waves to create images of your heart. It provides your doctor with information about the size and shape of your heart and how well your heart's chambers and valves are working. This procedure takes approximately one hour. There are no restrictions for this procedure.  Your physician has requested that you have an ankle brachial index (ABI). During this test an ultrasound and blood pressure cuff are used to evaluate the arteries that supply the arms and legs with blood. Allow thirty minutes for this exam. There are no restrictions or special instructions.  Follow-Up: At [facility], you and your health needs are our priority.  As part of our continuing mission to provide you with exceptional heart care, we have created designated Provider Care Teams.  These Care Teams include your primary Cardiologist (physician) and Advanced Practice Providers (APPs -  Physician Assistants and Nurse Practitioners) who all work together to provide you with the care you need, when you need it.  We recommend signing up for the patient portal called "MyChart".  Sign up information is provided on this After Visit Summary.  MyChart is used to  connect with patients for Virtual Visits (Telemedicine).  Patients are able to view lab/test results, encounter notes, upcoming appointments, etc.  Non-urgent messages can be sent to your provider as well.   To learn more about what you can do with MyChart, go to [email or web address].    Your next appointment:   6 month(s)  The format for your next appointment:   In Person  Provider:   Oswaldo Milian, MD   Other Instructions   Your cardiac CT will be scheduled at one of the below locations:   [facility] [address] [place],  [address] [phone]  If scheduled at [facility], please arrive at the [facility] main entrance (entrance A) of [facility] 30 minutes prior to test start time. Proceed to the [facility] Radiology Department (first floor) to check-in and test prep.  Please follow these instructions carefully (unless otherwise directed):  On the Night Before the Test: Be sure to Drink plenty of water. Do not consume any caffeinated/decaffeinated beverages or chocolate 12 hours prior to your test. Do not take any antihistamines 12 hours prior to your test.  On the Day of the Test: Drink plenty of water until 1 hour prior to the test. Do not eat any food 4 hours prior to the test. You may take your regular medications prior to the test.  HOLD Toprol XL day of test Take metoprolol (Lopressor) two hours prior to test. FEMALES- please wear underwire-free bra if available, avoid dresses & tight clothing      After the Test: Drink plenty of water.  After receiving IV contrast, you may experience a mild flushed feeling. This is normal. On occasion, you may experience a mild rash up to 24 hours after the test. This is not dangerous. If this occurs, you can take Benadryl 25 mg and increase your fluid intake. If you experience trouble breathing, this can be serious. If it is severe call 911 IMMEDIATELY. If it is mild, please  call our office. If you take any of these medications: Glipizide/Metformin, Avandament, Glucavance, please do not take 48 hours after completing test unless otherwise instructed.  Please allow 2-4 weeks for scheduling of routine cardiac CTs. Some insurance companies require a pre-authorization which may delay scheduling of this test.   For non-scheduling related questions, please contact the cardiac imaging nurse navigator should you have any questions/concerns: Marchia Bond, Cardiac Imaging Nurse Navigator Gordy Clement, Cardiac Imaging Nurse Navigator [facility] Direct Office Dial: [phone]   For scheduling needs, including cancellations and rescheduling, please call [place], [phone].

## 2021-01-30 ENCOUNTER — Ambulatory Visit: Payer: PPO | Admitting: Family Medicine

## 2021-02-03 ENCOUNTER — Other Ambulatory Visit: Payer: Self-pay | Admitting: Cardiology

## 2021-02-03 DIAGNOSIS — I739 Peripheral vascular disease, unspecified: Secondary | ICD-10-CM

## 2021-02-03 DIAGNOSIS — M79605 Pain in left leg: Secondary | ICD-10-CM

## 2021-02-06 ENCOUNTER — Other Ambulatory Visit: Payer: Self-pay

## 2021-02-06 ENCOUNTER — Encounter (HOSPITAL_COMMUNITY): Payer: Self-pay

## 2021-02-06 ENCOUNTER — Telehealth (HOSPITAL_COMMUNITY): Payer: Self-pay | Admitting: Emergency Medicine

## 2021-02-06 ENCOUNTER — Ambulatory Visit (HOSPITAL_COMMUNITY)
Admission: RE | Admit: 2021-02-06 | Discharge: 2021-02-06 | Disposition: A | Discharge status: Home / Self Care | Payer: PPO | Source: Ambulatory Visit | Attending: Cardiology | Admitting: Cardiology

## 2021-02-06 DIAGNOSIS — M79605 Pain in left leg: Secondary | ICD-10-CM | POA: Diagnosis not present

## 2021-02-06 DIAGNOSIS — I739 Peripheral vascular disease, unspecified: Secondary | ICD-10-CM

## 2021-02-06 NOTE — Telephone Encounter (Signed)
Reaching out to patient to offer assistance regarding upcoming cardiac imaging study; pt verbalizes understanding of appt date/time, parking situation and where to check in, pre-test NPO status and medications ordered, and verified current allergies; name and call back number provided for further questions should they arise Marchia Bond RN Navigator Cardiac Imaging [name] [phone] office [phone] cell  Denies claustro R ARM ONLY IV DUE TO MASTECTOMY '100mg'$  metoprolol tart  Holding daily metop succ

## 2021-02-10 ENCOUNTER — Other Ambulatory Visit: Payer: Self-pay

## 2021-02-10 ENCOUNTER — Ambulatory Visit (HOSPITAL_COMMUNITY)
Admission: RE | Admit: 2021-02-10 | Discharge: 2021-02-10 | Disposition: A | Discharge status: Home / Self Care | Payer: PPO | Source: Ambulatory Visit | Attending: Cardiology | Admitting: Cardiology

## 2021-02-10 DIAGNOSIS — R079 Chest pain, unspecified: Secondary | ICD-10-CM

## 2021-02-10 DIAGNOSIS — I2510 Atherosclerotic heart disease of native coronary artery without angina pectoris: Secondary | ICD-10-CM | POA: Insufficient documentation

## 2021-02-10 IMAGING — CT CT HEART MORP W/ CTA COR W/ SCORE W/ CA W/CM &/OR W/O CM
1 of 8 series · 11 of 20 positions shown, 14 images · IV contrast (APPLIED)
Comparison: None.
COMPARISON: None.

Addendum:
EXAM:
OVER-READ INTERPRETATION  CT CHEST

The following report is an over-read performed by radiologist Dr.
MINIER [REDACTED] on [DATE]. This
over-read does not include interpretation of cardiac or coronary
anatomy or pathology. The coronary calcium score/coronary CTA
interpretation by the cardiologist is attached.
CLINICAL DATA: Chest pain
Cardiac CTA
MEDICATIONS:
Sub lingual nitro. 4mg x 2
TECHNIQUE: The patient was scanned on a Siemens [REDACTED]ice scanner. Gantry
rotation speed was 250 msecs. Collimation was 0.6 mm. A 100 kV
prospective scan was triggered in the ascending thoracic aorta at
35-75% of the R-R interval. Average HR during the scan was 60 bpm.
The 3D data set was interpreted on a dedicated work station using
MPR, MIP and VRT modes. A total of 80cc of contrast was used.

[Series 12: multiphase ts · axial · 0.39mm/px · z∈[+1253,+1345]mm · 11 of 2484 slices shown, 14 images]
[im 207/2484  vessel]
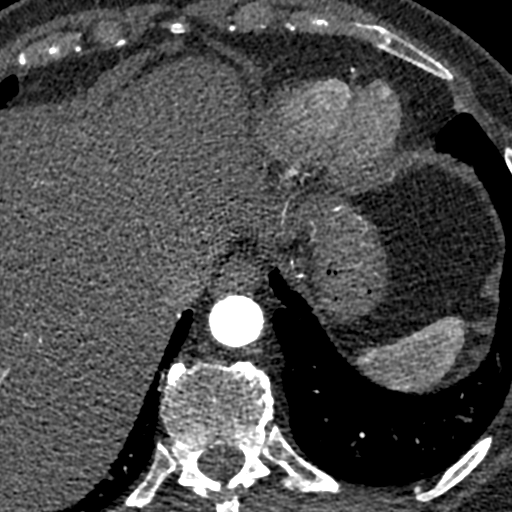
[im 207/2484  lung]
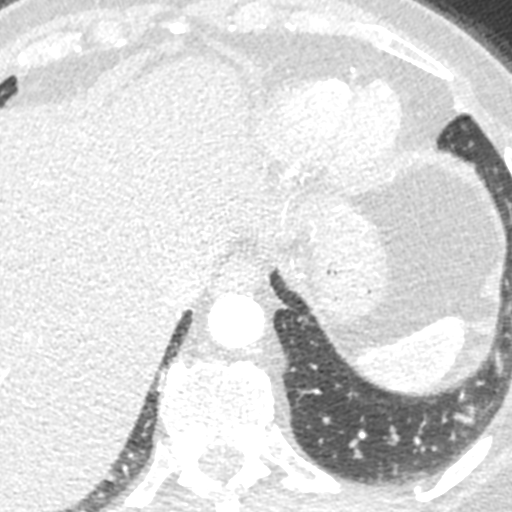
[im 414/2484  vessel]
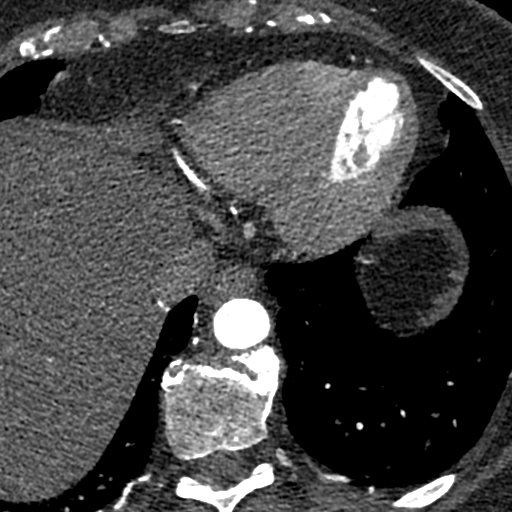
[im 621/2484  vessel]
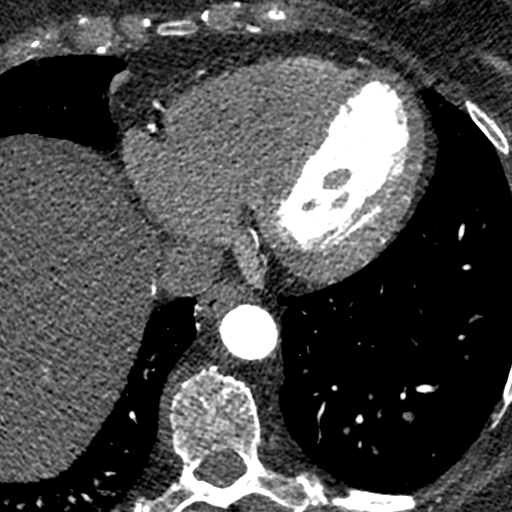
[im 828/2484  vessel]
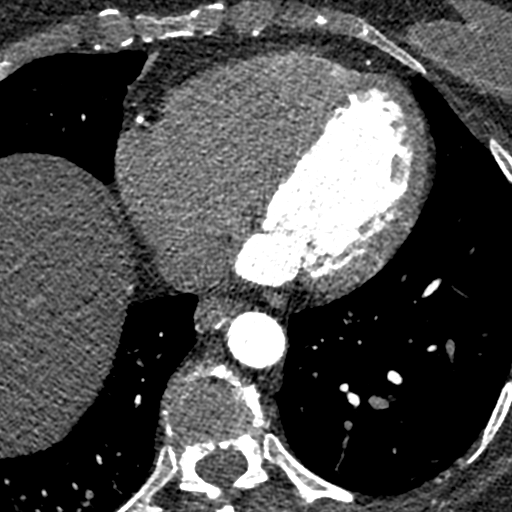
[im 1035/2484  vessel]
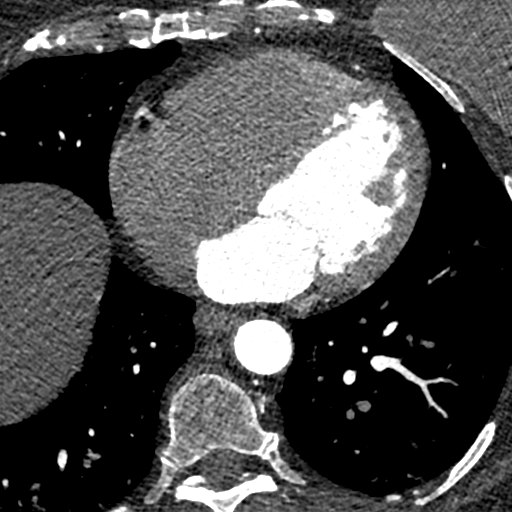
[im 1035/2484  lung]
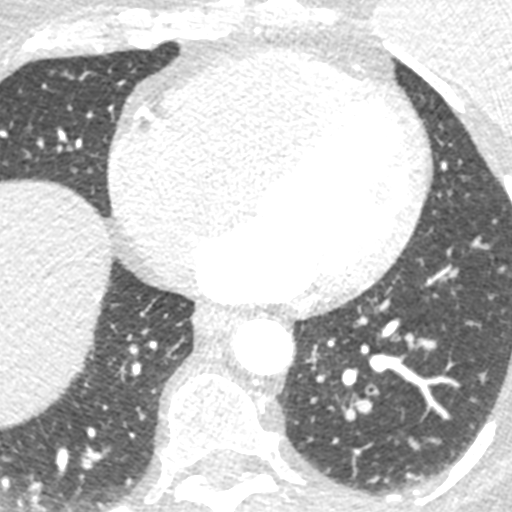
[im 1242/2484  vessel]
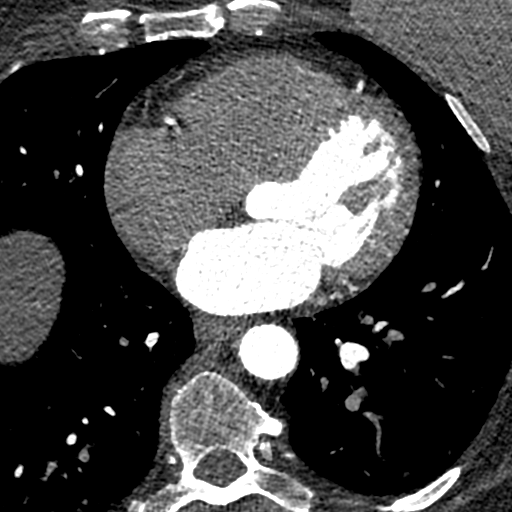
[im 1449/2484  vessel]
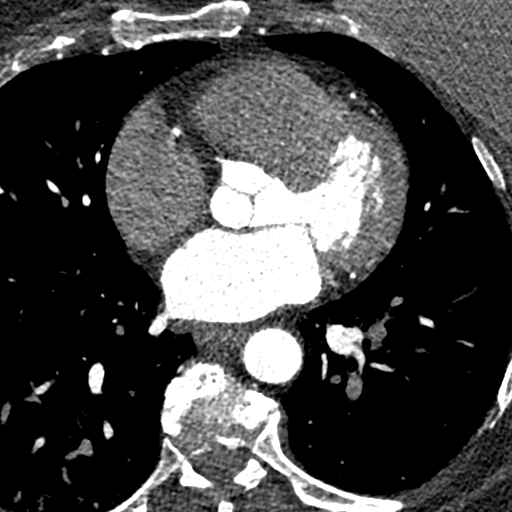
[im 1656/2484  vessel]
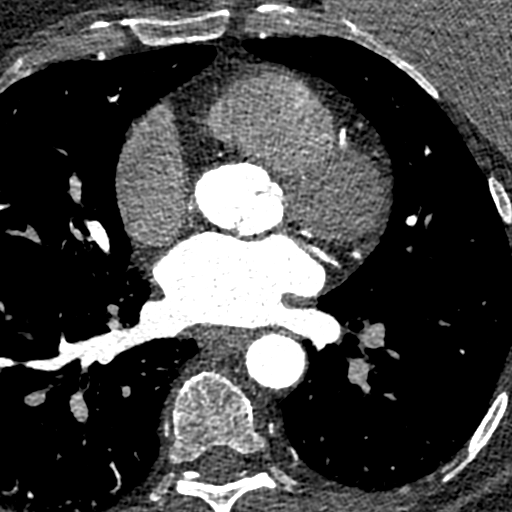
[im 1863/2484  vessel]
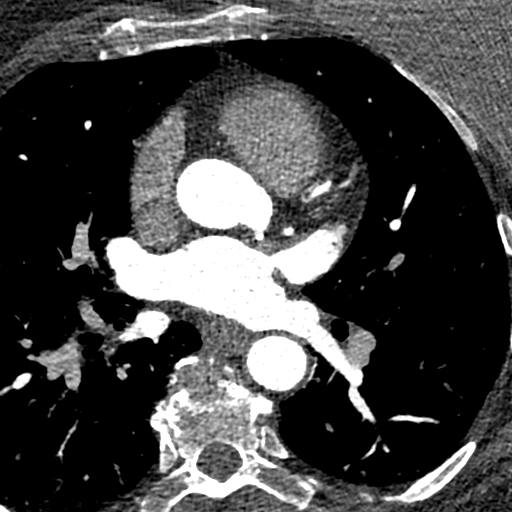
[im 1863/2484  lung]
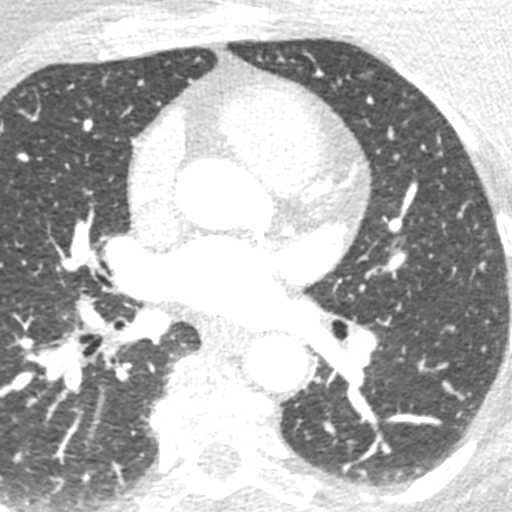
[im 2070/2484  vessel]
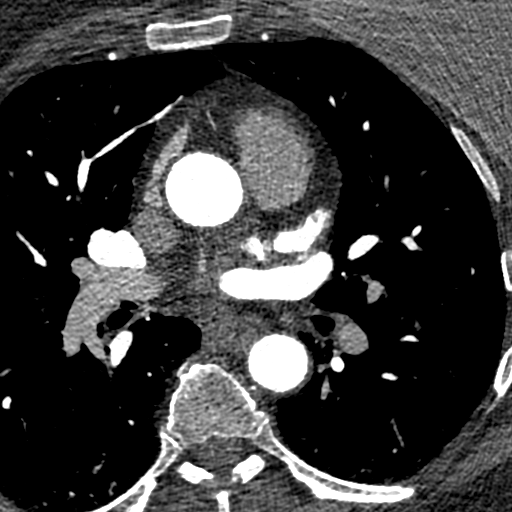
[im 2277/2484  vessel]
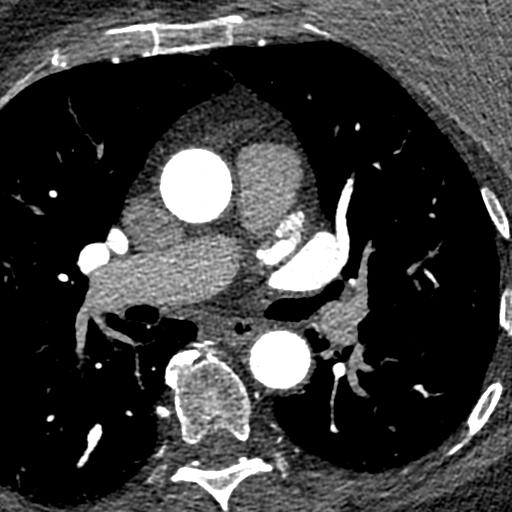

[11 of 20 positions shown; findings below may reference images not displayed]

FINDINGS: Vascular: None.

Mediastinum/Nodes: None.

Lungs/Pleura: None.

Upper Abdomen: Liver may be decreased in attenuation diffusely.

Musculoskeletal: Degenerative changes in the spine.
IMPRESSION: 1. No acute findings.
2. Liver may be steatotic.
FINDINGS: Non-cardiac: See separate report from [REDACTED].

Pulmonary veins drain normally to the left atrium. No LA appendage
thrombus.

Calcium Score: 9.2 Agatston units.

Coronary Arteries: Right dominant

LM: No left main, separate ostia for LCx and LAD off left cusp.

LAD system: Mixed plaque proximal LAD, minimal stenosis.

Circumflex system: No plaque or stenosis.

RCA system: No plaque or stenosis.
IMPRESSION: 1. Coronary artery calcium score 9.2 Agatston units. This places the
patient in the 50th percentile for age and gender, suggesting
intermediate risk for future cardiac events.

2.  Mild nonobstructive CAD.

MINIER

*** End of Addendum ***
EXAM:
OVER-READ INTERPRETATION  CT CHEST

The following report is an over-read performed by radiologist Dr.
MINIER [REDACTED] on [DATE]. This
over-read does not include interpretation of cardiac or coronary
anatomy or pathology. The coronary calcium score/coronary CTA
interpretation by the cardiologist is attached.
FINDINGS: Vascular: None.

Mediastinum/Nodes: None.

Lungs/Pleura: None.

Upper Abdomen: Liver may be decreased in attenuation diffusely.

Musculoskeletal: Degenerative changes in the spine.
IMPRESSION: 1. No acute findings.
2. Liver may be steatotic.

## 2021-02-10 MED ORDER — DILTIAZEM HCL 25 MG/5ML IV SOLN
5.0000 mg | INTRAVENOUS | Status: DC | PRN
Start: 2021-02-10 — End: 2021-02-11
  Administered 2021-02-10: 5 mg via INTRAVENOUS

## 2021-02-10 MED ORDER — DILTIAZEM HCL 25 MG/5ML IV SOLN
INTRAVENOUS | Status: AC
  Administered 2021-02-10: 5 mg via INTRAVENOUS
  Filled 2021-02-10: qty 5

## 2021-02-10 MED ORDER — NITROGLYCERIN 0.4 MG SL SUBL
SUBLINGUAL_TABLET | SUBLINGUAL | Status: AC
  Filled 2021-02-10: qty 2

## 2021-02-10 MED ORDER — IOHEXOL 350 MG/ML SOLN
100.0000 mL | Freq: Once | INTRAVENOUS | Status: AC | PRN
  Administered 2021-02-10: 95 mL via INTRAVENOUS

## 2021-02-10 MED ORDER — METOPROLOL TARTRATE 5 MG/5ML IV SOLN
10.0000 mg | INTRAVENOUS | Status: AC | PRN
Start: 2021-02-10 — End: 2021-02-10
  Administered 2021-02-10: 10 mg via INTRAVENOUS

## 2021-02-10 MED ORDER — METOPROLOL TARTRATE 5 MG/5ML IV SOLN
INTRAVENOUS | Status: AC
  Administered 2021-02-10: 10 mg via INTRAVENOUS
  Filled 2021-02-10: qty 20

## 2021-02-10 MED ORDER — NITROGLYCERIN 0.4 MG SL SUBL
0.8000 mg | SUBLINGUAL_TABLET | Freq: Once | SUBLINGUAL | Status: AC
  Administered 2021-02-10: 0.8 mg via SUBLINGUAL

## 2021-02-11 ENCOUNTER — Ambulatory Visit: Payer: PPO | Admitting: Family Medicine

## 2021-02-12 ENCOUNTER — Other Ambulatory Visit: Payer: Self-pay | Admitting: *Deleted

## 2021-02-12 DIAGNOSIS — K760 Fatty (change of) liver, not elsewhere classified: Secondary | ICD-10-CM | POA: Diagnosis not present

## 2021-02-12 DIAGNOSIS — I2510 Atherosclerotic heart disease of native coronary artery without angina pectoris: Secondary | ICD-10-CM | POA: Diagnosis not present

## 2021-02-12 DIAGNOSIS — E785 Hyperlipidemia, unspecified: Secondary | ICD-10-CM

## 2021-02-13 LAB — LIPID PANEL
Chol/HDL Ratio: 3.2 ratio (ref 0.0–4.4)
Cholesterol, Total: 193 mg/dL (ref 100–199)
HDL: 60 mg/dL (ref >39)
LDL Chol Calc (NIH): 105 mg/dL — ABNORMAL HIGH (ref 0–99)
Triglycerides: 163 mg/dL — ABNORMAL HIGH (ref 0–149)
VLDL Cholesterol Cal: 28 mg/dL (ref 5–40)

## 2021-02-13 LAB — HEPATIC FUNCTION PANEL
ALT: 34 IU/L — ABNORMAL HIGH (ref 0–32)
AST: 31 IU/L (ref 0–40)
Albumin: 4.4 g/dL (ref 3.8–4.8)
Alkaline Phosphatase: 89 IU/L (ref 44–121)
Bilirubin Total: 0.7 mg/dL (ref 0.0–1.2)
Bilirubin, Direct: 0.17 mg/dL (ref 0.00–0.40)
Total Protein: 7.1 g/dL (ref 6.0–8.5)

## 2021-02-13 MED ORDER — ATORVASTATIN CALCIUM 20 MG PO TABS: 20.0000 mg | ORAL_TABLET | Freq: Every day | ORAL | 3 refills | Status: DC

## 2021-02-13 NOTE — Addendum Note (Signed)
Addended by: Patria Mane A on: 02/13/2021 03:43 PM   Modules accepted: Orders

## 2021-02-14 ENCOUNTER — Other Ambulatory Visit: Payer: Self-pay

## 2021-02-14 ENCOUNTER — Ambulatory Visit (INDEPENDENT_AMBULATORY_CARE_PROVIDER_SITE_OTHER): Payer: PPO | Admitting: Family Medicine

## 2021-02-14 ENCOUNTER — Other Ambulatory Visit: Payer: Self-pay | Admitting: Family Medicine

## 2021-02-14 ENCOUNTER — Encounter: Payer: Self-pay | Admitting: Family Medicine

## 2021-02-14 VITALS — BP 122/82 | HR 90 | Temp 98.1°F | Ht 67.0 in | Wt 223.4 lb

## 2021-02-14 DIAGNOSIS — E7800 Pure hypercholesterolemia, unspecified: Secondary | ICD-10-CM

## 2021-02-14 DIAGNOSIS — R5383 Other fatigue: Secondary | ICD-10-CM

## 2021-02-14 DIAGNOSIS — R0781 Pleurodynia: Secondary | ICD-10-CM | POA: Diagnosis not present

## 2021-02-14 DIAGNOSIS — R413 Other amnesia: Secondary | ICD-10-CM

## 2021-02-14 LAB — VITAMIN B12: Vitamin B-12: 259 pg/mL (ref 211–911)

## 2021-02-14 NOTE — Progress Notes (Signed)
Chief Complaint  Patient presents with   Follow-up    Lab work    Subjective: Patient is a 70 y.o. female here for f/u.  Patient has been undergoing treatment/evaluation for chronic left thoracic pain.  MRI was unremarkable.  She went to the cardiology team where plaque was found with coronary artery calcium scoring.  She was started on Lipitor.  She would prefer to follow-up in our office for blood work.  She has a history of breast cancer requiring implant and mesh placement on the left.  She is wondering if that could be causing her left thoracic pain.  She has a follow-up with the sports medicine team within the next couple weeks.  For nearly a year, the patient has had issues with memory and difficulty with word finding.  Most of her labs have been normal with routine screening.  She is not having any issues with forgetting names, activities of daily living, or mood/behavior.  Past Medical History:  Diagnosis Date   Hypertension    Osteopenia     Objective: BP 122/82   Pulse 90   Temp 98.1 F (36.7 C) (Oral)   Ht '5\' 7"'$  (1.702 m)   Wt 223 lb 6 oz (101.3 kg)   SpO2 99%   BMI 34.99 kg/m  General: Awake, appears stated age MSK: Tender to palpation along the ribs, particularly over the anterior and posterior axillary lines between ribs 6 and 11 on the left; there is no crepitus, deformity, or mass noted Abdomen: Bowel sounds present, soft, mild tenderness over the left lateral abdominal region, no masses or organomegaly Heart: RRR, no bruits, lower extremity edema Neuro: Gait normal, speech fluent and goal oriented, no cerebellar signs Lungs: CTAB, no rales, wheezes or rhonchi. No accessory muscle use Psych: Age appropriate judgment and insight, normal affect and mood  Assessment and Plan: Pleuritic chest pain - Plan: Antinuclear Antib (ANA), Rheumatoid Factor, Cyclic citrul peptide antibody, IgG (QUEST), Histone antibodies, IgG, blood, Sjogrens syndrome-A extractable nuclear  antibody, Sjogrens syndrome-B extractable nuclear antibody, Aldolase, Uric acid, Sedimentation rate, C-reactive protein, Anti-DNA antibody, double-stranded, Anti-Smith antibody, Centromere Antibodies  Other fatigue - Plan: Antinuclear Antib (ANA), Rheumatoid Factor, Cyclic citrul peptide antibody, IgG (QUEST), Histone antibodies, IgG, blood, Sjogrens syndrome-A extractable nuclear antibody, Sjogrens syndrome-B extractable nuclear antibody, Aldolase, Uric acid, Sedimentation rate, C-reactive protein, Anti-DNA antibody, double-stranded, Anti-Smith antibody, Centromere Antibodies  Memory deficit - Plan: B12  Pure hypercholesterolemia - Plan: Hepatic function panel, Lipid panel  1/2.  Chronic, uncontrolled.  Check autoimmune labs.  Appreciate sports medicine input.  She would likely benefit from OMT and possibly physical therapy. 3.  New, uncertain prognosis.  Check B12, all of the labs have been normal in the past such as thyroid and CMP.  If B12 is normal, we will check a CT of the head and then refer to neuropsych if that is normal. 4.  She will return in around 6 weeks for labs after starting the Lipitor. The patient voiced understanding and agreement to the plan.  [place], DO 02/14/21  3:51 PM

## 2021-02-14 NOTE — Patient Instructions (Addendum)
Stay hydrated.  Continue the Vit D.   If labs are normal regarding the memory, we will check a scan of your head.   Foods that may reduce pain: 1) Ginger 2) Blueberries 3) Salmon 4) Pumpkin seeds 5) dark chocolate 6) turmeric 7) tart cherries 8) virgin olive oil 9) chilli peppers 10) mint 11) red wine  Let us know if you need anything.

## 2021-02-17 LAB — ALDOLASE: Aldolase: 4.3 U/L (ref <=8.1)

## 2021-02-18 LAB — ANTI-SMITH ANTIBODY: ENA SM Ab Ser-aCnc: <1 AI

## 2021-02-18 LAB — RHEUMATOID FACTOR: Rheumatoid fact SerPl-aCnc: <14 IU/mL (ref <14)

## 2021-02-18 LAB — HISTONE ANTIBODIES, IGG, BLOOD: Histone Antibodies: <1 U (ref <1.0)

## 2021-02-18 LAB — CENTROMERE ANTIBODIES: Centromere Ab Screen: <1 AI

## 2021-02-18 LAB — C-REACTIVE PROTEIN: CRP: 6.1 mg/L (ref <8.0)

## 2021-02-18 LAB — URIC ACID: Uric Acid, Serum: 6.9 mg/dL (ref 2.5–7.0)

## 2021-02-18 LAB — SEDIMENTATION RATE: Sed Rate: 28 mm/h (ref 0–30)

## 2021-02-18 LAB — CYCLIC CITRUL PEPTIDE ANTIBODY, IGG: Cyclic Citrullin Peptide Ab: <16 UNITS

## 2021-02-18 LAB — SJOGRENS SYNDROME-B EXTRACTABLE NUCLEAR ANTIBODY: SSB (La) (ENA) Antibody, IgG: <1 AI

## 2021-02-18 LAB — ANTI-DNA ANTIBODY, DOUBLE-STRANDED: ds DNA Ab: <1 IU/mL

## 2021-02-18 LAB — ANA: Anti Nuclear Antibody (ANA): NEGATIVE

## 2021-02-18 LAB — SJOGRENS SYNDROME-A EXTRACTABLE NUCLEAR ANTIBODY: SSA (Ro) (ENA) Antibody, IgG: <1 AI

## 2021-02-25 ENCOUNTER — Ambulatory Visit (HOSPITAL_BASED_OUTPATIENT_CLINIC_OR_DEPARTMENT_OTHER): Payer: PPO

## 2021-02-25 ENCOUNTER — Other Ambulatory Visit: Payer: Self-pay

## 2021-02-25 ENCOUNTER — Other Ambulatory Visit: Payer: Self-pay | Admitting: Family Medicine

## 2021-02-25 DIAGNOSIS — R079 Chest pain, unspecified: Secondary | ICD-10-CM | POA: Diagnosis not present

## 2021-02-25 DIAGNOSIS — R0600 Dyspnea, unspecified: Secondary | ICD-10-CM | POA: Diagnosis not present

## 2021-02-25 DIAGNOSIS — R0609 Other forms of dyspnea: Secondary | ICD-10-CM

## 2021-02-25 DIAGNOSIS — R413 Other amnesia: Secondary | ICD-10-CM | POA: Diagnosis not present

## 2021-02-25 LAB — ECHOCARDIOGRAM COMPLETE
Area-P 1/2: 4.21 cm2
S' Lateral: 3 cm

## 2021-02-25 MED ORDER — ROSUVASTATIN CALCIUM 10 MG PO TABS: 10.0000 mg | ORAL_TABLET | Freq: Every day | ORAL | 3 refills | Status: DC

## 2021-02-25 NOTE — Progress Notes (Deleted)
[name] Phone: [phone] Subjective:    I'm seeing this patient by the request  of:  Tonya Pal, DO  CC:   [id number]  01/22/2021 Patient continues to have a chronic side pain.  Seems to be more of a scapular dyskinesis.  Given some home exercises that I think will be beneficial.  We discussed with patient about the possibility of formal physical therapy but patient states that she did go to physical therapy for this for a year.  Has had multiple different medications including muscle relaxers as well as gabapentin with no significant benefit.  Patient patient did not want to repeat these at the moment.  I do feel because of the longevity of this as well as history of breast cancer then an MRI of the thoracic spine can be beneficial.  Patient is significantly anxious that something else was going on.  We will get the MRI but likely depending on this then we will discuss other treatment options.   In addition to this we have reviewed patient's chart previously.  Patient has had an abnormal stress echocardiogram previously but nuclear stress test was unremarkable in 2015.  Patient though is having pain that does seem to be worse with activity and gets better with rest.  Still seems to be more muscular but I do think that referral to cardiology would be beneficial with what appears of a family history of her mother dying from a heart attack.  Patient given red flags and when to seek medical attention or going to the emergency room.  Patient does have some underlying anxiety that I think is also contributing and we will discuss with patient primary care depending on further work-up.  Update 02/26/2021 Tonya [name] is a 70 y.o. female coming in with complaint of thoracic spine pain. Patient states  MRI thoracic 01/23/2021 IMPRESSION: 1. No acute osseous abnormality of the thoracic spine. 2. Mild multilevel  thoracic spondylosis with scattered noncompressive disc protrusions. No foraminal or canal stenosis at any level.         Past Medical History:  Diagnosis Date   Hypertension    Osteopenia    Past Surgical History:  Procedure Laterality Date   ABDOMINAL HYSTERECTOMY  2015   AUGMENTATION MAMMAPLASTY Left    BREAST BIOPSY Right 02/2019   BREAST IMPLANT EXCHANGE     MASTECTOMY Left    REDUCTION MAMMAPLASTY Right    TONSILLECTOMY AND ADENOIDECTOMY  1958   Social History   Socioeconomic History   Marital status: Married    Spouse name: Not on file   Number of children: Not on file   Years of education: Not on file   Highest education level: Not on file  Occupational History   Not on file  Tobacco Use   Smoking status: Never   Smokeless tobacco: Never  Vaping Use   Vaping Use: Never used  Substance and Sexual Activity   Alcohol use: Not on file    Comment: rarely   Drug use: Never   Sexual activity: Yes    Birth control/protection: None  Other Topics Concern   Not on file  Social History Narrative   Not on file   Social Determinants of Health   Financial Resource Strain: Not on file  Food Insecurity: Not on file  Transportation Needs: Not on file  Physical Activity: Not on file  Stress: Not on file  Social Connections: Not on file  No Known Allergies Family History  Problem Relation Age of Onset   Heart attack Father        died from   Breast cancer Maternal Aunt    Breast cancer Paternal Aunt      Current Outpatient Medications (Cardiovascular):    atorvastatin (LIPITOR) 20 MG tablet, Take 1 tablet (20 mg total) by mouth daily.   losartan (COZAAR) 50 MG tablet, Take 1 tablet (50 mg total) by mouth daily.   metoprolol succinate (TOPROL-XL) 25 MG 24 hr tablet, Take 1 tablet (25 mg total) by mouth daily.  Current Outpatient Medications (Respiratory):    montelukast (SINGULAIR) 10 MG tablet, Take 1 tablet (10 mg total) by mouth at  bedtime.    Current Outpatient Medications (Other):    calcium-vitamin D (OSCAL WITH D) 250-125 MG-UNIT tablet, Take 1 tablet by mouth daily.   pantoprazole (PROTONIX) 20 MG tablet, Take 1 tablet (20 mg total) by mouth daily.   Reviewed prior external information including notes and imaging from  primary care provider As well as notes that were available from care everywhere and other healthcare systems.  Past medical history, social, surgical and family history all reviewed in electronic medical record.  No pertanent information unless stated regarding to the chief complaint.   Review of Systems:  No headache, visual changes, nausea, vomiting, diarrhea, constipation, dizziness, abdominal pain, skin rash, fevers, chills, night sweats, weight loss, swollen lymph nodes, body aches, joint swelling, chest pain, shortness of breath, mood changes. POSITIVE muscle aches  Objective  There were no vitals taken for this visit.   General: No apparent distress alert and oriented x3 mood and affect normal, dressed appropriately.  HEENT: Pupils equal, extraocular movements intact  Respiratory: Patient's speak in full sentences and does not appear short of breath  Cardiovascular: No lower extremity edema, non tender, no erythema  Gait normal with good balance and coordination.  MSK:  Non tender with full range of motion and good stability and symmetric strength and tone of shoulders, elbows, wrist, hip, knee and ankles bilaterally.     Impression and Recommendations:     The above documentation has been reviewed and is accurate and complete [name]

## 2021-02-26 ENCOUNTER — Ambulatory Visit (HOSPITAL_BASED_OUTPATIENT_CLINIC_OR_DEPARTMENT_OTHER)
Admission: RE | Admit: 2021-02-26 | Discharge: 2021-02-26 | Disposition: A | Discharge status: Home / Self Care | Payer: PPO | Source: Ambulatory Visit | Attending: Family Medicine | Admitting: Family Medicine

## 2021-02-26 ENCOUNTER — Encounter: Payer: Self-pay | Admitting: Family Medicine

## 2021-02-26 ENCOUNTER — Ambulatory Visit: Payer: PPO | Admitting: Family Medicine

## 2021-02-26 DIAGNOSIS — R079 Chest pain, unspecified: Secondary | ICD-10-CM | POA: Insufficient documentation

## 2021-02-26 DIAGNOSIS — R519 Headache, unspecified: Secondary | ICD-10-CM | POA: Diagnosis not present

## 2021-02-26 DIAGNOSIS — R413 Other amnesia: Secondary | ICD-10-CM | POA: Insufficient documentation

## 2021-02-26 DIAGNOSIS — R0600 Dyspnea, unspecified: Secondary | ICD-10-CM | POA: Insufficient documentation

## 2021-02-26 IMAGING — CT CT HEAD W/O CM
3 series · 15 of 47 positions shown, 18 images · non-contrast
Comparison: None.

CLINICAL DATA: Memory loss.  Six months of headache.

EXAM:
CT HEAD WITHOUT CONTRAST
TECHNIQUE: Contiguous axial images were obtained from the base of the skull
through the vertex without intravenous contrast.

[Series 2: head wo · axial · 0.44mm/px · z∈[-167,-37]mm · 9 of 32 slices shown, 12 images]
[im 3/32  brain]
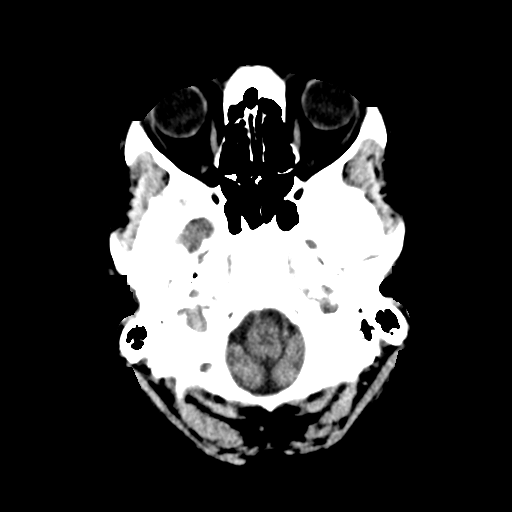
[im 3/32  bone]
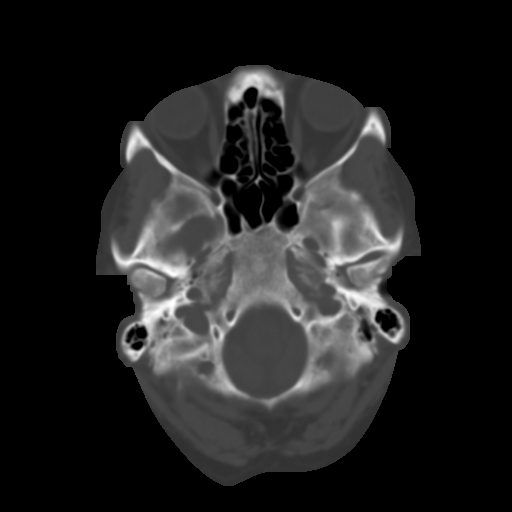
[im 6/32  brain]
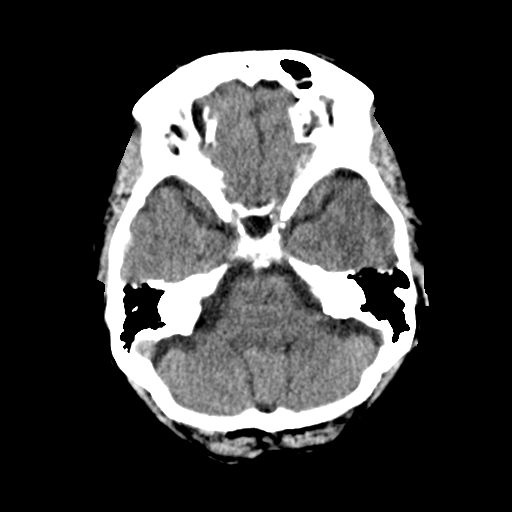
[im 9/32  brain]
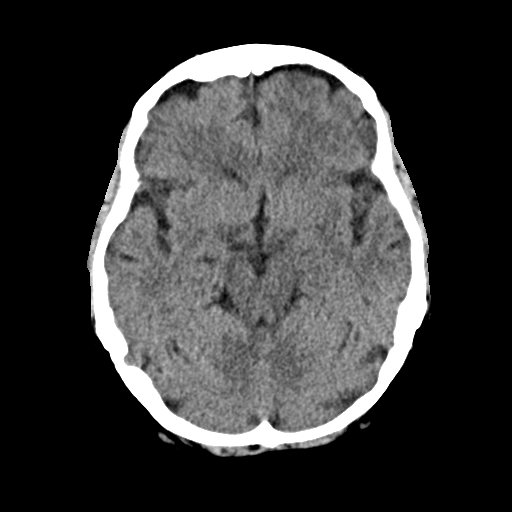
[im 12/32  brain]
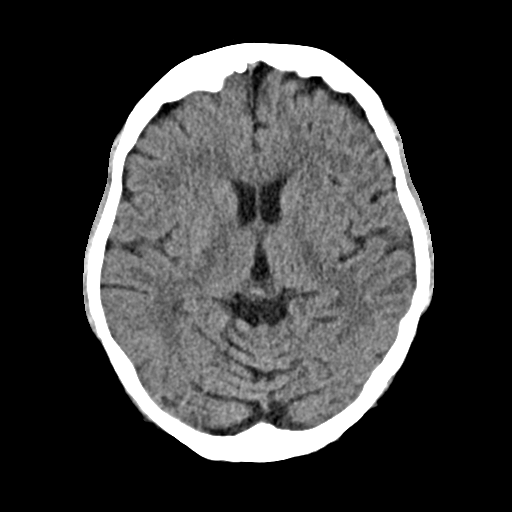
[im 17/32  brain]
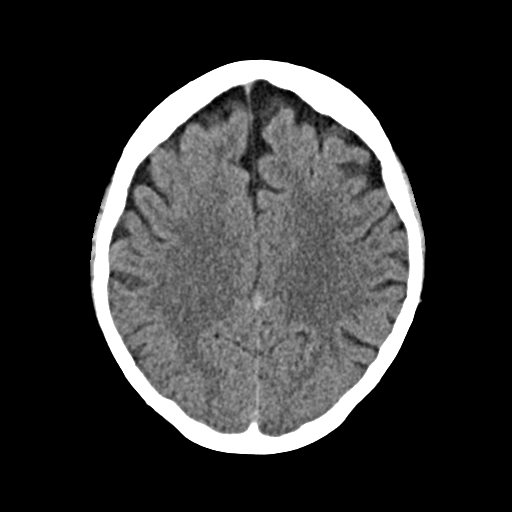
[im 17/32  bone]
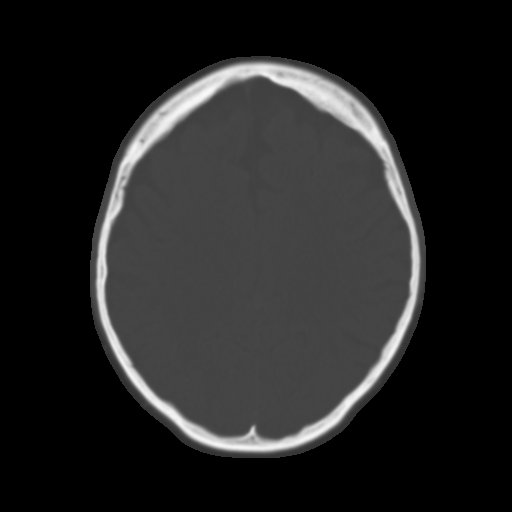
[im 20/32  brain]
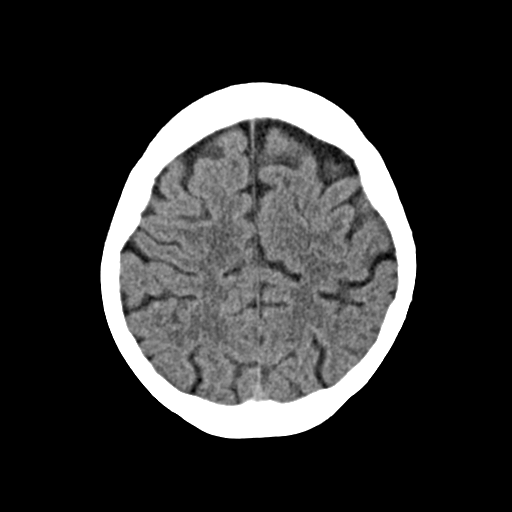
[im 23/32  brain]
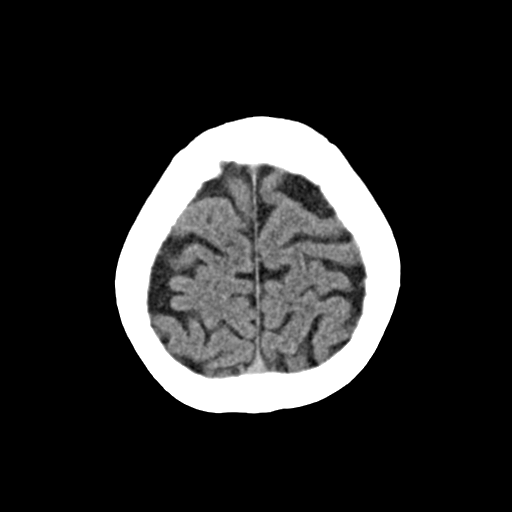
[im 26/32  brain]
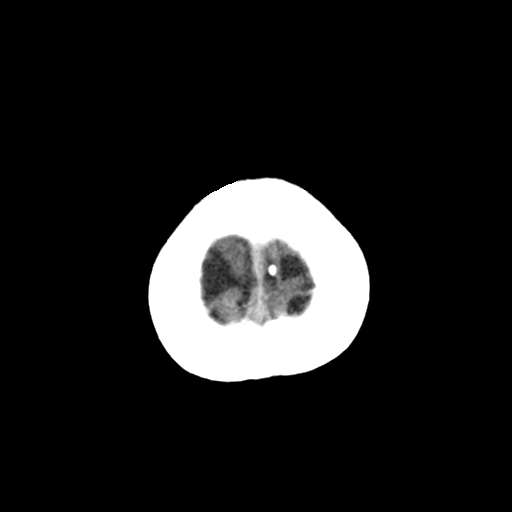
[im 29/32  brain]
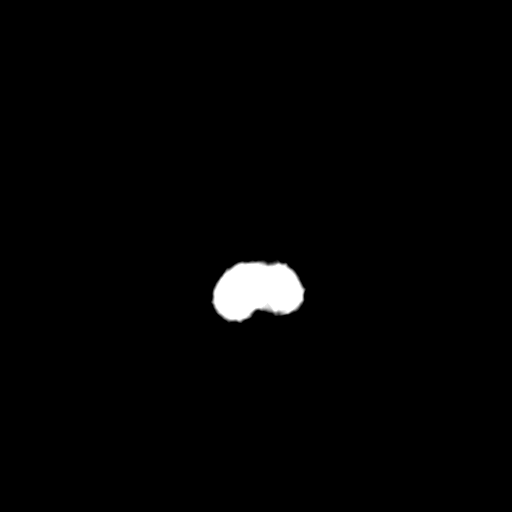
[im 29/32  bone]
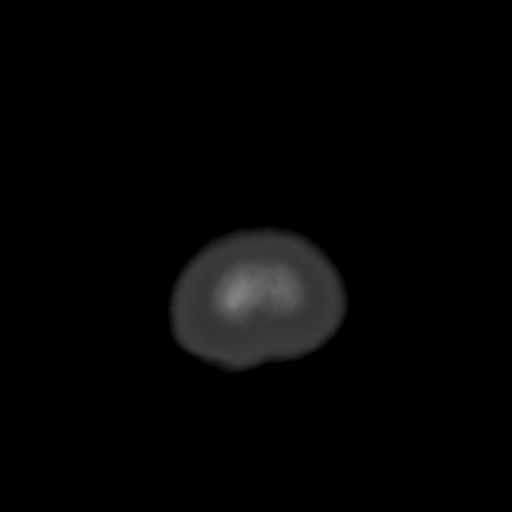

[Series 4: coronal soft · coronal · 0.31mm/px · 3 of 68 slices shown]
[im 23/68  brain]
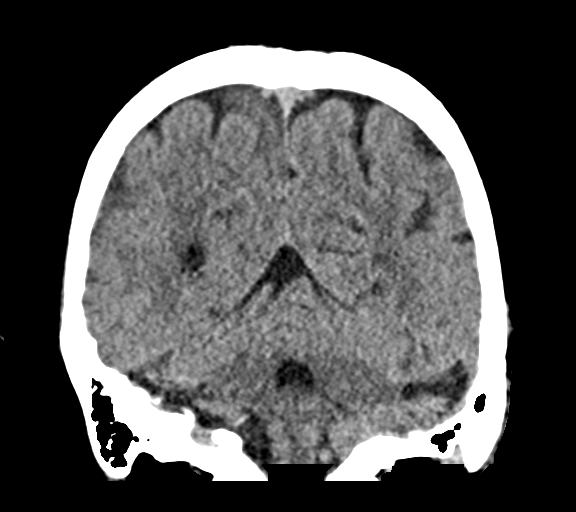
[im 30/68  brain]
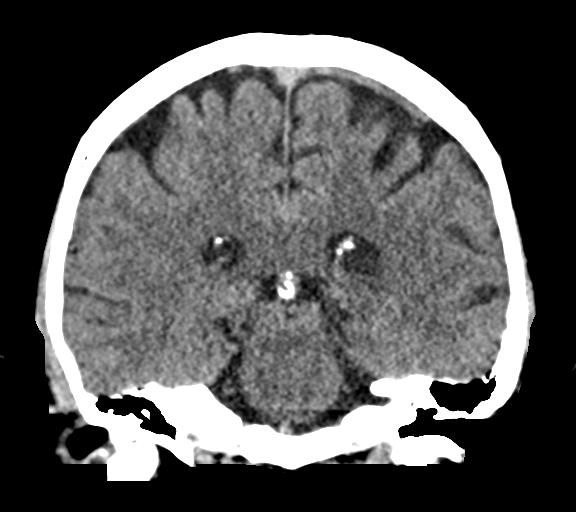
[im 38/68  brain]
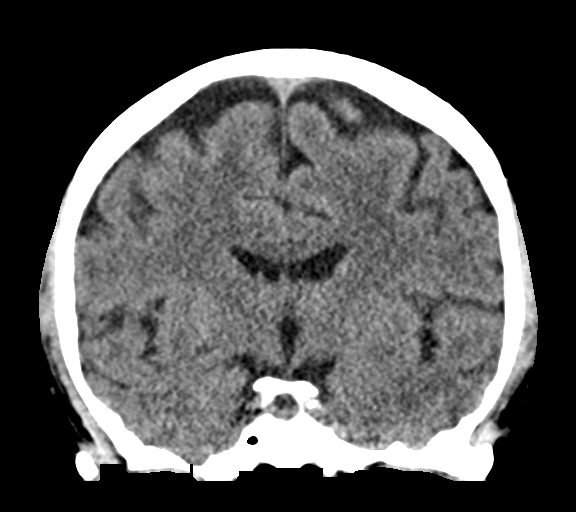

[Series 5: sag soft · sagittal · 0.32mm/px · 3 of 58 slices shown]
[im 20/58  brain]
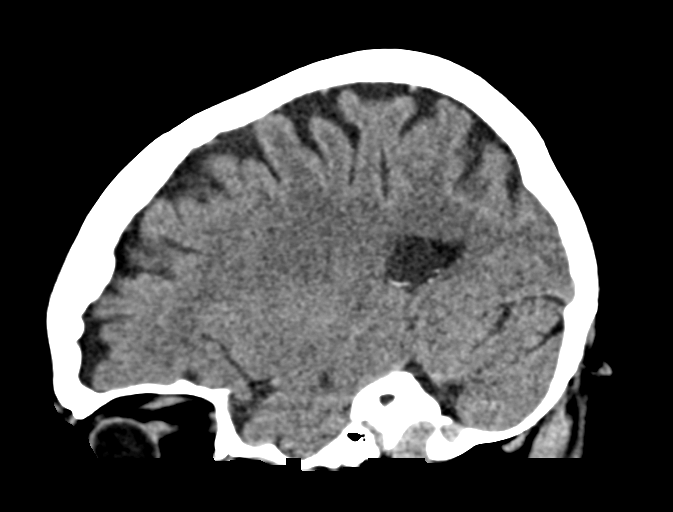
[im 29/58  brain]
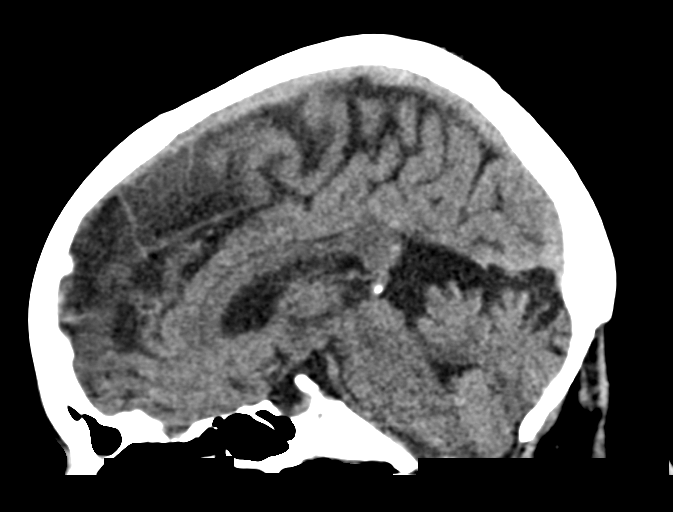
[im 39/58  brain]
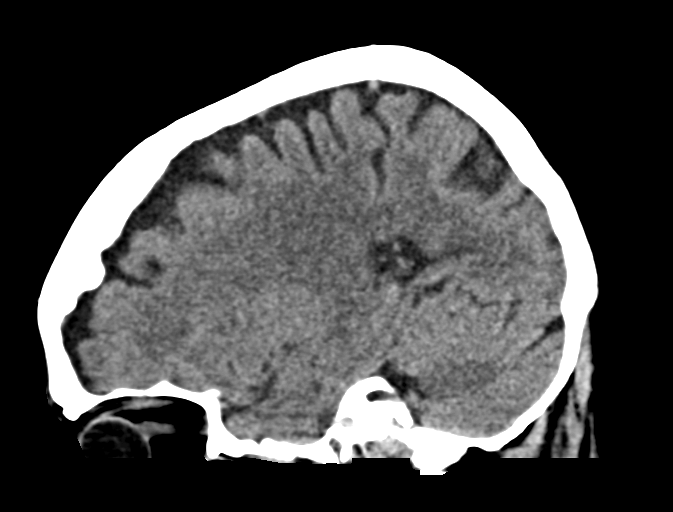

[15 of 47 positions shown; findings below may reference images not displayed]

FINDINGS: Brain: No evidence of acute infarction, hemorrhage, hydrocephalus,
extra-axial collection or mass lesion/mass effect. Generalized age
related atrophy without lobar predilection.

Vascular: Atherosclerosis of skullbase vasculature without
hyperdense vessel or abnormal calcification.

Skull: Normal. Negative for fracture or focal lesion.

Sinuses/Orbits: Paranasal sinuses and mastoid air cells are clear.
The visualized orbits are unremarkable.

Other: None.
IMPRESSION: 1. No acute intracranial abnormality.
2. Mild generalized atrophy is normal for age.

## 2021-02-27 ENCOUNTER — Encounter: Payer: Self-pay | Admitting: Psychology

## 2021-02-27 ENCOUNTER — Other Ambulatory Visit: Payer: Self-pay | Admitting: Family Medicine

## 2021-02-27 DIAGNOSIS — R413 Other amnesia: Secondary | ICD-10-CM

## 2021-02-27 NOTE — Progress Notes (Signed)
Tonya [name] [address] [place] Phone: [phone] Subjective:   IVilma [name], am serving as a scribe for Dr. Hulan Saas. This visit occurred during the SARS-CoV-2 public health emergency.  Safety protocols were in place, including screening questions prior to the visit, additional usage of staff PPE, and extensive cleaning of exam room while observing appropriate contact time as indicated for disinfecting solutions.   I'm seeing this patient by the request  of:  [name], DO  CC: Neck and upper back pain follow-up  [id number]  01/22/2021 Patient continues to have a chronic side pain.  Seems to be more of a scapular dyskinesis.  Given some home exercises that I think will be beneficial.  We discussed with patient about the possibility of formal physical therapy but patient states that she did go to physical therapy for this for a year.  Has had multiple different medications including muscle relaxers as well as gabapentin with no significant benefit.  Patient patient did not want to repeat these at the moment.  I do feel because of the longevity of this as well as history of breast cancer then an MRI of the thoracic spine can be beneficial.  Patient is significantly anxious that something else was going on.  We will get the MRI but likely depending on this then we will discuss other treatment options.   In addition to this we have reviewed patient's chart previously.  Patient has had an abnormal stress echocardiogram previously but nuclear stress test was unremarkable in 2015.  Patient though is having pain that does seem to be worse with activity and gets better with rest.  Still seems to be more muscular but I do think that referral to cardiology would be beneficial with what appears of a family history of her mother dying from a heart attack.  Patient given red flags and when to seek medical attention or going to the emergency  room.  Patient does have some underlying anxiety that I think is also contributing and we will discuss with patient primary care depending on further work-up.  Updated 9/92022 Tonya [name] is a 70 y.o. female coming in with complaint of neck pain with extension. She is getting progressively better, especially if she does her scapular exercises when the pain hits.  Patient states that she has been making progress and that does not stop her passive frequently.  Patient did see cardiology and so far has not noticed anything significant yet.  Patient does have an appointment with the neuropsychology team The patient states that she still feels like there is something abnormal with the chest overall.  Sometimes feels like her heart may go faster than usual.  Past medical history once again is significant for breast cancer and patient is always concerned that something could potentially happen.  IMPRESSION: 01/23/2021 1. No acute osseous abnormality of the thoracic spine. 2. Mild multilevel thoracic spondylosis with scattered noncompressive disc protrusions. No foraminal or canal stenosis at any level.      Past Medical History:  Diagnosis Date   Hypertension    Osteopenia    Past Surgical History:  Procedure Laterality Date   ABDOMINAL HYSTERECTOMY  2015   AUGMENTATION MAMMAPLASTY Left    BREAST BIOPSY Right 02/2019   BREAST IMPLANT EXCHANGE     MASTECTOMY Left    REDUCTION MAMMAPLASTY Right    TONSILLECTOMY AND ADENOIDECTOMY  1958   Social History   Socioeconomic History  Marital status: Married    Spouse name: Not on file   Number of children: Not on file   Years of education: Not on file   Highest education level: Not on file  Occupational History   Not on file  Tobacco Use   Smoking status: Never   Smokeless tobacco: Never  Vaping Use   Vaping Use: Never used  Substance and Sexual Activity   Alcohol use: Not on file    Comment: rarely   Drug use: Never   Sexual  activity: Yes    Birth control/protection: None  Other Topics Concern   Not on file  Social History Narrative   Not on file   Social Determinants of Health   Financial Resource Strain: Not on file  Food Insecurity: Not on file  Transportation Needs: Not on file  Physical Activity: Not on file  Stress: Not on file  Social Connections: Not on file   No Known Allergies Family History  Problem Relation Age of Onset   Heart attack Father        died from   Breast cancer Maternal Aunt    Breast cancer Paternal Aunt      Current Outpatient Medications (Cardiovascular):    losartan (COZAAR) 50 MG tablet, Take 1 tablet (50 mg total) by mouth daily.   metoprolol succinate (TOPROL-XL) 25 MG 24 hr tablet, Take 1 tablet (25 mg total) by mouth daily.   rosuvastatin (CRESTOR) 10 MG tablet, Take 1 tablet (10 mg total) by mouth daily.  Current Outpatient Medications (Respiratory):    montelukast (SINGULAIR) 10 MG tablet, Take 1 tablet (10 mg total) by mouth at bedtime.    Current Outpatient Medications (Other):    calcium-vitamin D (OSCAL WITH D) 250-125 MG-UNIT tablet, Take 1 tablet by mouth daily.   pantoprazole (PROTONIX) 20 MG tablet, Take 1 tablet (20 mg total) by mouth daily.   Reviewed prior external information including notes and imaging from  primary care provider this includes patient's most recent CT of the head done since we have seen patient in the CT coronary testing that are fairly unremarkable. As well as notes that were available from care everywhere and other healthcare systems.  Past medical history, social, surgical and family history all reviewed in electronic medical record.  No pertanent information unless stated regarding to the chief complaint.   Review of Systems:  No headache, visual changes, nausea, vomiting, diarrhea, constipation, dizziness, abdominal pain, skin rash, fevers, chills, night sweats, weight loss, swollen lymph nodes, body aches, joint  swelling, chest pain, shortness of breath, mood changes. POSITIVE muscle aches  Objective  Blood pressure 140/90, pulse 81, height '5\' 7"'$  (1.702 m), weight 223 lb (101.2 kg), SpO2 97 %.   General: No apparent distress alert and oriented x3 mood and affect normal, dressed appropriately.  Patient is still somewhat anxious. HEENT: Pupils equal, extraocular movements intact  Respiratory: Patient's speak in full sentences and does not appear short of breath  Cardiovascular: No lower extremity edema, non tender, no erythema  Gait normal with good balance and coordination.  MSK: Patient still has tightness noted diffusely.  Patient's mid back does have some mild to moderate loss of rotation noted as well as extension and flexion.  Very mild increase in kyphosis of the upper back.  Neck exam, very minimal loss of lordosis as well as sidebending bilaterally but negative Spurling's.    Impression and Recommendations:     The above documentation has been reviewed and is accurate  and complete Lyndal Pulley, DO

## 2021-02-28 ENCOUNTER — Ambulatory Visit: Payer: PPO | Admitting: Family Medicine

## 2021-02-28 ENCOUNTER — Other Ambulatory Visit: Payer: Self-pay

## 2021-02-28 DIAGNOSIS — G8929 Other chronic pain: Secondary | ICD-10-CM | POA: Diagnosis not present

## 2021-02-28 DIAGNOSIS — M546 Pain in thoracic spine: Secondary | ICD-10-CM | POA: Diagnosis not present

## 2021-02-28 NOTE — Patient Instructions (Signed)
Good to see you! Keep doing exercises  Talk to you cardiologist about a holter monitor See you again in 6-8 weeks

## 2021-02-28 NOTE — Telephone Encounter (Signed)
Yes that is fine Thanks, Gerald Stabs

## 2021-03-01 NOTE — Assessment & Plan Note (Signed)
Discussed with patient at length.  I do feel like she is making some progress.  Patient still is concerned more aware of the cardiovascular potential.  Discussed with her about potentially talking to her cardiologist about a Holter monitor but so far exam and testing has been unremarkable in this category.  Patient does seem to have some underlying anxiety that I think is also contributing.  Patient feels though that she did know something was wrong prior to her diagnosis of breast cancer and she feels like she is having relatively similar presentation.  Fortunately for [place] patient is making progress and encouraged her to continue to do some of the home exercises and we can continue to monitor.  Advanced imaging has been unremarkable.  Follow-up with me again in 6 to 8 weeks.  Total time reviewing imaging and discussing with patient 33 minutes.

## 2021-03-07 DIAGNOSIS — R002 Palpitations: Secondary | ICD-10-CM

## 2021-03-09 NOTE — Telephone Encounter (Signed)
Recommend Zio patch x7 days for further evaluation

## 2021-03-10 ENCOUNTER — Ambulatory Visit (INDEPENDENT_AMBULATORY_CARE_PROVIDER_SITE_OTHER): Payer: PPO

## 2021-03-10 DIAGNOSIS — R002 Palpitations: Secondary | ICD-10-CM

## 2021-03-10 NOTE — Progress Notes (Unsigned)
Patient enrolled for [employer] to mail a 7 day ZIO XT monitor to her home.

## 2021-03-13 DIAGNOSIS — R002 Palpitations: Secondary | ICD-10-CM

## 2021-03-18 ENCOUNTER — Ambulatory Visit: Payer: PPO | Admitting: Family Medicine

## 2021-03-25 NOTE — Progress Notes (Signed)
[place] Phone: [phone] Subjective:   Tonya [name], am serving as a scribe for Dr. Hulan Saas. This visit occurred during the SARS-CoV-2 public health emergency.  Safety protocols were in place, including screening questions prior to the visit, additional usage of staff PPE, and extensive cleaning of exam room while observing appropriate contact time as indicated for disinfecting solutions.   I'm seeing this patient by the request  of:  [name], DO  CC: Back pain follow-up  [id number]  02/28/2021 Discussed with patient at length.  I do feel like she is making some progress.  Patient still is concerned more aware of the cardiovascular potential.  Discussed with her about potentially talking to her cardiologist about a Holter monitor but so far exam and testing has been unremarkable in this category.  Patient does seem to have some underlying anxiety that I think is also contributing.  Patient feels though that she did know something was wrong prior to her diagnosis of breast cancer and she feels like she is having relatively similar presentation.  Fortunately for [place] patient is making progress and encouraged her to continue to do some of the home exercises and we can continue to monitor.  Advanced imaging has been unremarkable.  Follow-up with me again in 6 to 8 weeks.  Total time reviewing imaging and discussing with patient 33 minutes.  Updated 03/26/2021 Tonya [name] is a 70 y.o. female coming in with complaint of neck and back pain. Pain in L scapula and L thoracic spine near bra line. Patient said that her pain comes out of [name] where but is more frequent than last visit. Pain increases when her arms are out in front of her body.   Patient was to start wearing her Holter monitor at the beginning of September.   Patient is on as needed the MRI done of the thoracic spine that showed mild  multilevel thoracic spondylosis with disc protrusions but nothing significant.  Laboratory work-up also was reviewed and unremarkable     Past Medical History:  Diagnosis Date   Hypertension    Osteopenia    Past Surgical History:  Procedure Laterality Date   ABDOMINAL HYSTERECTOMY  2015   AUGMENTATION MAMMAPLASTY Left    BREAST BIOPSY Right 02/2019   BREAST IMPLANT EXCHANGE     MASTECTOMY Left    REDUCTION MAMMAPLASTY Right    TONSILLECTOMY AND ADENOIDECTOMY  1958   Social History   Socioeconomic History   Marital status: Married    Spouse name: Not on file   Number of children: Not on file   Years of education: Not on file   Highest education level: Not on file  Occupational History   Not on file  Tobacco Use   Smoking status: Never   Smokeless tobacco: Never  Vaping Use   Vaping Use: Never used  Substance and Sexual Activity   Alcohol use: Not on file    Comment: rarely   Drug use: Never   Sexual activity: Yes    Birth control/protection: None  Other Topics Concern   Not on file  Social History Narrative   Not on file   Social Determinants of Health   Financial Resource Strain: Not on file  Food Insecurity: Not on file  Transportation Needs: Not on file  Physical Activity: Not on file  Stress: Not on file  Social Connections: Not on file   [name] Known  Allergies Family History  Problem Relation Age of Onset   Heart attack Father        died from   Breast cancer Maternal Aunt    Breast cancer Paternal Aunt      Current Outpatient Medications (Cardiovascular):    losartan (COZAAR) 50 MG tablet, Take 1 tablet (50 mg total) by mouth daily.   metoprolol succinate (TOPROL-XL) 25 MG 24 hr tablet, Take 1 tablet (25 mg total) by mouth daily.   rosuvastatin (CRESTOR) 10 MG tablet, Take 1 tablet (10 mg total) by mouth daily.  Current Outpatient Medications (Respiratory):    montelukast (SINGULAIR) 10 MG tablet, Take 1 tablet (10 mg total) by mouth at  bedtime.    Current Outpatient Medications (Other):    calcium-vitamin D (OSCAL WITH D) 250-125 MG-UNIT tablet, Take 1 tablet by mouth daily.   pantoprazole (PROTONIX) 20 MG tablet, Take 1 tablet (20 mg total) by mouth daily.   Reviewed prior external information including notes and imaging from  primary care provider As well as notes that were available from care everywhere and other healthcare systems.  Past medical history, social, surgical and family history all reviewed in electronic medical record.  [name] pertanent information unless stated regarding to the chief complaint.   Review of Systems:  [name] headache, visual changes, nausea, vomiting, diarrhea, constipation, dizziness, abdominal pain, skin rash, fevers, chills, night sweats, weight loss, swollen lymph nodes,  joint swelling, chest pain, shortness of breath, mood changes. POSITIVE muscle aches, body aches, palpitations  Objective  Blood pressure 106/66, pulse 92, height 5\' 7"  (1.702 m), weight 224 lb (101.6 kg), SpO2 96 %.   General: [name] apparent distress alert and oriented x3 mood and affect normal, dressed appropriately.  HEENT: Pupils equal, extraocular movements intact  Respiratory: Patient's speak in full sentences and does not appear short of breath  Cardiovascular: [name] lower extremity edema, non tender, [name] erythema  Gait normal with good balance and coordination.  MSK:  Non tender with full range of motion and good stability and symmetric strength and tone of shoulders, elbows, wrist, hip, knee and ankles bilaterally.  Mild increase in kyphosis noted.  Patient does have tightness noted of the paraspinal musculature especially in the parascapular region left greater than right.  Osteopathic findings C3 flexed rotated and side bent right C7 flexed rotated and side bent left T4 extended rotated and side bent left with inhaled rib T8 extended rotated side bent left L4 flexed rotated and side bent right Sacrum right on  right   Impression and Recommendations:     The above documentation has been reviewed and is accurate and complete Lyndal Pulley, DO

## 2021-03-26 ENCOUNTER — Ambulatory Visit: Payer: PPO | Admitting: Family Medicine

## 2021-03-26 ENCOUNTER — Other Ambulatory Visit: Payer: Self-pay

## 2021-03-26 VITALS — BP 106/66 | HR 92 | Ht 67.0 in | Wt 224.0 lb

## 2021-03-26 DIAGNOSIS — M9901 Segmental and somatic dysfunction of cervical region: Secondary | ICD-10-CM

## 2021-03-26 DIAGNOSIS — M546 Pain in thoracic spine: Secondary | ICD-10-CM

## 2021-03-26 DIAGNOSIS — M9903 Segmental and somatic dysfunction of lumbar region: Secondary | ICD-10-CM

## 2021-03-26 DIAGNOSIS — M9904 Segmental and somatic dysfunction of sacral region: Secondary | ICD-10-CM | POA: Diagnosis not present

## 2021-03-26 DIAGNOSIS — R002 Palpitations: Secondary | ICD-10-CM | POA: Diagnosis not present

## 2021-03-26 DIAGNOSIS — G8929 Other chronic pain: Secondary | ICD-10-CM

## 2021-03-26 DIAGNOSIS — M9902 Segmental and somatic dysfunction of thoracic region: Secondary | ICD-10-CM | POA: Insufficient documentation

## 2021-03-26 DIAGNOSIS — M9908 Segmental and somatic dysfunction of rib cage: Secondary | ICD-10-CM | POA: Diagnosis not present

## 2021-03-26 NOTE — Assessment & Plan Note (Signed)
Chronic problem with continued exacerbation.  Did respond fairly well to the osteopathic manipulation.  With patient's history of the breast mass and the ductal carcinoma patient did have the MRI of the thoracic spine that was unremarkable.  Recent CT scan also did not show anything of the chest.  Still being worked up by cardiology.  Responded well though to osteopathic manipulation and encouraged her to continue to do the exercises.  Follow-up with me again in 6 weeks otherwise to continue osteopathic manipulation.

## 2021-03-26 NOTE — Patient Instructions (Signed)
Keep doing exercises Try new thoracic exercise See me in 5-6 weeks

## 2021-03-26 NOTE — Assessment & Plan Note (Signed)

## 2021-04-08 NOTE — Telephone Encounter (Signed)
Monitor looks good, no significant abnormalities.  Few episodes of fast heart rhythm from top chamber of heart (SVT) but no symptoms reported during these episodes and longest episode was only 11 seconds.  Symptomatic episodes corresponded to normal heart rhythm.

## 2021-04-09 ENCOUNTER — Ambulatory Visit: Payer: PPO | Admitting: Family Medicine

## 2021-04-09 ENCOUNTER — Telehealth: Payer: Self-pay | Admitting: Cardiology

## 2021-04-09 NOTE — Telephone Encounter (Signed)
Tonya [name] is returning [name]. I advised her of Dr. Newman Nickels availability in [place] on 11/17 as well as scheduling with the PA. Patient stated [name] left on her VM something about scheduling with Dr. Remo Lipps and that she did not want to see a PA. Advised her I would send a message for callback.

## 2021-04-09 NOTE — Telephone Encounter (Signed)
Spoke to patient, scheduled follow up with Dr. Gardiner Rhyme.

## 2021-04-09 NOTE — Telephone Encounter (Signed)
Left message to call back to arrange follow up

## 2021-04-09 NOTE — Telephone Encounter (Signed)
Patient was calling in from her mychart message

## 2021-04-09 NOTE — Telephone Encounter (Signed)
I called back and she was very upset that she still does not feel well... she has pressure in her chest with minimal exertion, she just generally does not feel well... she read her monitor reports and she is not comfortable being told that it looks "good"... she says she knows something is wrong and she wants to see Dr. Gardiner Rhyme soon and have her husband come with her. She is very anxious.... I advised her that I will forward to Dr. Gardiner Rhyme for his review and his nurse to see if there is any cancellations in the near future... I asked her to reach out to her PCP and she says she has done nothing to help her feel better..   She says that recently she had to walk a distance and she was out of breath, her chest felt like she was having a "spasm" and she could not make it.. her husband had to get the truck... she says this happens all of the time.   I will forward her note.   I spent over 20 min on the phone with the pt and felt she was upset when I advised her that I will forward her message and we will reach back out when we have an appt opening.

## 2021-04-10 ENCOUNTER — Ambulatory Visit: Payer: PPO | Admitting: Family Medicine

## 2021-04-11 ENCOUNTER — Ambulatory Visit: Payer: PPO | Admitting: Family Medicine

## 2021-04-27 NOTE — Progress Notes (Signed)
Cardiology Office Note:    Date:  04/29/2021   ID:  Tonya [name], DOB 1950/09/23, MRN [id number]  PCP:  [name], DO  Cardiologist:  None  Electrophysiologist:  None   Referring MD: [name]*   Chief Complaint  Patient presents with   Chest Pain     History of Present Illness:    Tonya [name] is a 70 y.o. female with a hx of hypertension who presents for follow-up.  She was referred by Dr. Tamala Julian for evaluation of chest pain, initially seen on 01/29/2021.  She previously followed with Dr. Lubertha Basque in cardiology at [facility], last seen in 2015.  Underwent a stress EKG which showed ST depressions.  Nuclear stress test was ordered (do not have report) but she states was told was normal.   She reports that she has been having heaviness on the left side of her chest.  Typically occurs when lying down at night, happens weekly to monthly.  Also reports episodes of pain in her neck/shoulder/back.  She has not been exercising since foot surgery last year.  Reports she gets short of breath with exertion but denies any clear relationship with exertion and chest pain.  Reports BP typically 120s over 70s.  Reports occasional lightheadedness but denies any syncope.  Reports occasional lower extremity edema.  Reports she has been having leg pain/cramping in left leg, can occur with exertion but also notices it at night.  Denies any heart palpitations.  No smoking history.  Family history includes mother died of MI at age 22, brother died of MI at age 23, father died of MI at age 55.  Coronary CTA on 02/08/2021 showed mild nonobstructive CAD with mixed plaque in proximal LAD causing minimal stenosis (calcium score 9, 50th percentile).  Echocardiogram 02/25/2021 showed EF 50 to 41%, grade 1 diastolic dysfunction, low normal RV function, no significant valvular disease.  Zio patch x7 days on 03/27/2021 showed 12 episodes of SVT, with longest lasting 11 seconds at rate 132 bpm.  Since  last clinic visit, she reports that she continues to have intermittent chest pain.  Describes as stabbing pain on left side of chest.  Also sometimes tightness.  Also reports she has been feeling short of breath with minimal exertion.  Continues to feel like heart is racing at times.  Unable to tolerate statins due to headaches and nausea.  Symptoms have resolved when she stops statin.  Past Medical History:  Diagnosis Date   Hypertension    Osteopenia     Past Surgical History:  Procedure Laterality Date   ABDOMINAL HYSTERECTOMY  2015   AUGMENTATION MAMMAPLASTY Left    BREAST BIOPSY Right 02/2019   BREAST IMPLANT EXCHANGE     MASTECTOMY Left    REDUCTION MAMMAPLASTY Right    TONSILLECTOMY AND ADENOIDECTOMY  1958    Current Medications: Current Meds  Medication Sig   calcium-vitamin D (OSCAL WITH D) 250-125 MG-UNIT tablet Take 1 tablet by mouth daily.   losartan (COZAAR) 50 MG tablet Take 1 tablet (50 mg total) by mouth daily.   metoprolol succinate (TOPROL-XL) 25 MG 24 hr tablet Take 1 tablet (25 mg total) by mouth daily.   montelukast (SINGULAIR) 10 MG tablet Take 1 tablet (10 mg total) by mouth at bedtime.   pantoprazole (PROTONIX) 20 MG tablet Take 1 tablet (20 mg total) by mouth daily.     Allergies:   Patient has no known allergies.   Social History   Socioeconomic History  Marital status: Married    Spouse name: Not on file   Number of children: Not on file   Years of education: Not on file   Highest education level: Not on file  Occupational History   Not on file  Tobacco Use   Smoking status: Never   Smokeless tobacco: Never  Vaping Use   Vaping Use: Never used  Substance and Sexual Activity   Alcohol use: Not on file    Comment: rarely   Drug use: Never   Sexual activity: Yes    Birth control/protection: None  Other Topics Concern   Not on file  Social History Narrative   Not on file   Social Determinants of Health   Financial Resource Strain: Not  on file  Food Insecurity: Not on file  Transportation Needs: Not on file  Physical Activity: Not on file  Stress: Not on file  Social Connections: Not on file     Family History: The patient's family history includes Breast cancer in her maternal aunt and paternal aunt; Heart attack in her father.  ROS:   Please see the history of present illness.     All other systems reviewed and are negative.  EKGs/Labs/Other Studies Reviewed:    The following studies were reviewed today:   EKG:  EKG is ordered today.  The ekg ordered today demonstrates normal sinus rhythm, rate 77, no ST/T abnormality  Recent Labs: 09/20/2020: Hemoglobin 14.2; Platelets 181.0; TSH 1.31 01/29/2021: BUN 15; Creatinine, Ser 0.84; Potassium 5.0; Sodium 142 02/12/2021: ALT 34  Recent Lipid Panel    Component Value Date/Time   CHOL 193 02/12/2021 1113   TRIG 163 (H) 02/12/2021 1113   HDL 60 02/12/2021 1113   CHOLHDL 3.2 02/12/2021 1113   CHOLHDL 4 09/20/2020 1129   VLDL 17.0 09/20/2020 1129   LDLCALC 105 (H) 02/12/2021 1113    Physical Exam:    VS:  BP 134/62   Pulse 77   Ht 5\' 7"  (1.702 m)   Wt 225 lb 9.6 oz (102.3 kg)   SpO2 95%   BMI 35.33 kg/m     Wt Readings from Last 3 Encounters:  04/28/21 225 lb 9.6 oz (102.3 kg)  03/26/21 224 lb (101.6 kg)  02/28/21 223 lb (101.2 kg)     GEN:  Well nourished, well developed in no acute distress HEENT: Normal NECK: No JVD; No carotid bruits LYMPHATICS: No lymphadenopathy CARDIAC: RRR, no murmurs, rubs, gallops RESPIRATORY:  Clear to auscultation without rales, wheezing or rhonchi  ABDOMEN: Soft, non-tender, non-distended MUSCULOSKELETAL:  No edema; No deformity  SKIN: Warm and dry NEUROLOGIC:  Alert and oriented x 3 PSYCHIATRIC:  Normal affect   ASSESSMENT:    1. CAD in native artery   2. Palpitations   3. Hyperlipidemia, unspecified hyperlipidemia type   4. Essential hypertension     PLAN:    CAD: Reports atypical chest pain. Coronary CTA  on 02/08/2021 showed mild nonobstructive CAD with mixed plaque in proximal LAD causing minimal stenosis (calcium score 9, 50th percentile).  Echocardiogram 02/25/2021 showed EF 50 to 55%, grade 1 diastolic dysfunction, low normal RV function, no significant valvular disease.   -Unable to tolerate atorvastatin or rosuvastatin due to headache/nausea.  Will refer to pharmacy lipid clinic to evaluate for PCSK9 inhibitor  Palpitations: Zio patch x7 days on 03/27/2021 showed 12 episodes of SVT, with longest lasting 11 seconds at rate 132 bpm.  No symptoms reported during SVT episodes.  Left leg pain/cramping: Normal ABIs on  02/06/21  Hypertension: On losartan 50 mg daily, Toprol-XL 25 mg daily.  Appears controlled  Hyperlipidemia: LDL 135 on 09/20/2020.  Started atorvastatin 20 mg daily but unable to tolerate.  Switched to rosuvastatin 10 mg daily but unable to tolerate.  Had headache/nausea on both statins.  Referred to lipid clinic for PCSK9 inhibitor  RTC in 6 months  Medication Adjustments/Labs and Tests Ordered: Current medicines are reviewed at length with the patient today.  Concerns regarding medicines are outlined above.  Orders Placed This Encounter  Procedures   AMB Referral to Advanced Lipid Disorders Clinic   EKG 12-Lead     No orders of the defined types were placed in this encounter.    Patient Instructions  Medication Instructions:  Your physician recommends that you continue on your current medications as directed. Please refer to the Current Medication list given to you today.  *If you need a refill on your cardiac medications before your next appointment, please call your pharmacy*  Follow-Up: At [facility], you and your health needs are our priority.  As part of our continuing mission to provide you with exceptional heart care, we have created designated Provider Care Teams.  These Care Teams include your primary Cardiologist (physician) and Advanced Practice Providers  (APPs -  Physician Assistants and Nurse Practitioners) who all work together to provide you with the care you need, when you need it.  We recommend signing up for the patient portal called "MyChart".  Sign up information is provided on this After Visit Summary.  MyChart is used to connect with patients for Virtual Visits (Telemedicine).  Patients are able to view lab/test results, encounter notes, upcoming appointments, etc.  Non-urgent messages can be sent to your provider as well.   To learn more about what you can do with MyChart, go to [email or web address].    Your next appointment:   6 month(s)  The format for your next appointment:   In Person  Provider:   Dr. Gardiner Rhyme   Other Instructions You have been referred to: Lipid clinic with our pharmacist     Signed, Donato Heinz, MD  04/29/2021 11:18 PM    [place]

## 2021-04-28 ENCOUNTER — Encounter: Payer: Self-pay | Admitting: Cardiology

## 2021-04-28 ENCOUNTER — Other Ambulatory Visit: Payer: Self-pay

## 2021-04-28 ENCOUNTER — Ambulatory Visit: Payer: PPO | Admitting: Cardiology

## 2021-04-28 VITALS — BP 134/62 | HR 77 | Ht 67.0 in | Wt 225.6 lb

## 2021-04-28 DIAGNOSIS — I2510 Atherosclerotic heart disease of native coronary artery without angina pectoris: Secondary | ICD-10-CM | POA: Diagnosis not present

## 2021-04-28 DIAGNOSIS — I10 Essential (primary) hypertension: Secondary | ICD-10-CM

## 2021-04-28 DIAGNOSIS — R002 Palpitations: Secondary | ICD-10-CM

## 2021-04-28 DIAGNOSIS — E785 Hyperlipidemia, unspecified: Secondary | ICD-10-CM | POA: Diagnosis not present

## 2021-04-28 NOTE — Patient Instructions (Signed)
Medication Instructions:  Your physician recommends that you continue on your current medications as directed. Please refer to the Current Medication list given to you today.  *If you need a refill on your cardiac medications before your next appointment, please call your pharmacy*  Follow-Up: At [facility], you and your health needs are our priority.  As part of our continuing mission to provide you with exceptional heart care, we have created designated Provider Care Teams.  These Care Teams include your primary Cardiologist (physician) and Advanced Practice Providers (APPs -  Physician Assistants and Nurse Practitioners) who all work together to provide you with the care you need, when you need it.  We recommend signing up for the patient portal called "MyChart".  Sign up information is provided on this After Visit Summary.  MyChart is used to connect with patients for Virtual Visits (Telemedicine).  Patients are able to view lab/test results, encounter notes, upcoming appointments, etc.  Non-urgent messages can be sent to your provider as well.   To learn more about what you can do with MyChart, go to [email or web address].    Your next appointment:   6 month(s)  The format for your next appointment:   In Person  Provider:   Dr. Gardiner Rhyme   Other Instructions You have been referred to: Lipid clinic with our pharmacist

## 2021-04-30 ENCOUNTER — Ambulatory Visit: Payer: PPO | Admitting: Family Medicine

## 2021-05-19 ENCOUNTER — Ambulatory Visit: Payer: PPO | Admitting: Pharmacist Clinician (PhC)/ Clinical Pharmacy Specialist

## 2021-05-19 ENCOUNTER — Other Ambulatory Visit: Payer: Self-pay

## 2021-05-19 ENCOUNTER — Encounter: Payer: Self-pay | Admitting: Pharmacist Clinician (PhC)/ Clinical Pharmacy Specialist

## 2021-05-19 DIAGNOSIS — E785 Hyperlipidemia, unspecified: Secondary | ICD-10-CM | POA: Diagnosis not present

## 2021-05-19 MED ORDER — EZETIMIBE 10 MG PO TABS: 10.0000 mg | ORAL_TABLET | Freq: Every day | ORAL | 6 refills | Status: DC

## 2021-05-19 NOTE — Patient Instructions (Addendum)
Your Results:             Your most recent labs Goal  Total Cholesterol 193 < 200  Triglycerides 163 < 150  HDL (happy/good cholesterol) 60 > 40  LDL (lousy/bad cholesterol 105 < 70     Medication changes:  We will start ezetimibe 10 mg once daily.  If this causes you to have any side effects, please let us know and we can then try Repatha (injection every 2 weeks).   Lab orders:  We will repeat cholesterol labs after 2 months of therapy.     Thank you for choosing [facility]

## 2021-05-19 NOTE — Progress Notes (Signed)
05/19/2021 Tonya [name] [facility] 09-17-1950 [id number]   HPI:  Tonya [name] is a 70 y.o. female patient of Dr Gardiner Rhyme, who presents today for a lipid clinic evaluation.  See pertinent past medical history below.  She was recently seen by Dr. Gardiner Rhyme after a coronary calcium scan showed her to have mixed plaques in the proximal LAD.  She was challenged with 2 different statin medications, but was unable to tolerate either (see below), due to myalgias and headaches.    Past Medical History: CAD Coronary calcium score at 50th percentile; atypical angina  hypertension BP at goal today on losartan 50, metoprolol succ 25   Current Medications: none  Cholesterol Goals: LDL < 70   Intolerant/previously tried:  atorvastatin, rosuvastatin - both caused myalgias, headaches, nausea  Family history: mother died at 61, dad at 25, brother at 63, all from [place];  1 other brother died at 38 lung disease; 1 daughter no know issues; half sister (father) died of sepsis  Diet: mostly home cooked, enjoys occasional fine dining, no fast food  Exercise:  no regular exercise; exercise for back /rib displacement; active with grandkids  Labs:  8/22:  TC 193, TG 163, HDL 60, LDL 105   Current Outpatient Medications  Medication Sig Dispense Refill   calcium-vitamin D (OSCAL WITH D) 250-125 MG-UNIT tablet Take 1 tablet by mouth daily.     ezetimibe (ZETIA) 10 MG tablet Take 1 tablet (10 mg total) by mouth daily. 30 tablet 6   losartan (COZAAR) 50 MG tablet Take 1 tablet (50 mg total) by mouth daily. 90 tablet 3   metoprolol succinate (TOPROL-XL) 25 MG 24 hr tablet Take 1 tablet (25 mg total) by mouth daily. 90 tablet 3   montelukast (SINGULAIR) 10 MG tablet Take 1 tablet (10 mg total) by mouth at bedtime. 90 tablet 3   pantoprazole (PROTONIX) 20 MG tablet Take 1 tablet (20 mg total) by mouth daily. 90 tablet 3   No current facility-administered medications for this visit.    Allergies  Allergen Reactions    Crestor [Rosuvastatin]     Myalgias    Lipitor [Atorvastatin]     myalgias    Past Medical History:  Diagnosis Date   Hypertension    Osteopenia     Blood pressure 130/72, pulse 91, resp. rate 15, height 5\' 7"  (1.702 m), weight 227 lb 6.4 oz (103.1 kg), SpO2 95 %.   Hyperlipidemia Patient with hyperlipidemia and CAD, LDL not to goal of < 70.  Has been intolerant to 2 different statin drugs.  Reviewed options for lowering LDL cholesterol, including ezetimibe, PCSK-9 inhibitors, bempedoic acid and inclisiran.  Discussed mechanisms of action, dosing, side effects and potential decreases in LDL cholesterol.  Also reviewed cost information and potential options for patient assistance.  Answered all patient questions.  Based on this information, patient would prefer to start ezetimibe 10 mg daily.  Advised patient to take with food as she notes becoming easily nauseated with medications.  Will repeat lipid labs after 3 months, then potentially start PCSK-9 inhibitor if not to LDL goals   Tommy Medal PharmD CPP [facility] [address] [place], [place] [address] [phone]

## 2021-05-19 NOTE — Assessment & Plan Note (Signed)
Patient with hyperlipidemia and CAD, LDL not to goal of < 70.  Has been intolerant to 2 different statin drugs.  Reviewed options for lowering LDL cholesterol, including ezetimibe, PCSK-9 inhibitors, bempedoic acid and inclisiran.  Discussed mechanisms of action, dosing, side effects and potential decreases in LDL cholesterol.  Also reviewed cost information and potential options for patient assistance.  Answered all patient questions.  Based on this information, patient would prefer to start ezetimibe 10 mg daily.  Advised patient to take with food as she notes becoming easily nauseated with medications.  Will repeat lipid labs after 3 months, then potentially start PCSK-9 inhibitor if not to LDL goals

## 2021-05-22 NOTE — Progress Notes (Signed)
GYNECOLOGY ANNUAL PREVENTATIVE CARE ENCOUNTER NOTE  History:     Tonya [name] is a 70 y.o. G34P1001 female here for a routine annual gynecologic exam.  Current complaints: recurrent vaginal infections- sometimes yeast and sometimes BV she thinks. She has done vaginal flagyl and this helps. She also has a topical cream for yeast that helps when she has a lot of external itching. She has some discomfort with intercourse at times.   She hs a history of left breast cancer s/p left mastectomy. It was Stage 0. She found it herself. She thinks it is from frequent pesticides at some point in her house around the time of her diagnosis.      She also notes right breast lump. She has some pain below that area.   Denies abnormal vaginal bleeding, discharge, pelvic pain, problems with intercourse or other gynecologic concerns.     Gynecologic History Last Pap: 2017. Result was normal with normal HPV - no history of abnormal paps. Additionally she had a hysterectomy in 2015 - It was a RA-TLH/BSO for persistent PMB.   Last Mammogram: 12/2020.  Result was normal Last Colonoscopy: 2016.  Result was normal  Obstetric History OB History  Gravida Para Term Preterm AB Living  1 1 1     1   SAB IAB Ectopic Multiple Live Births          1    # Outcome Date GA Lbr Len/2nd Weight Sex Delivery Anes PTL Lv  1 Term 1986 [redacted]w[redacted]d   F Vag-Spont None N LIV    Past Medical History:  Diagnosis Date   Hypertension    Osteopenia     Past Surgical History:  Procedure Laterality Date   ABDOMINAL HYSTERECTOMY  2015   AUGMENTATION MAMMAPLASTY Left    BREAST BIOPSY Right 02/2019   BREAST IMPLANT EXCHANGE     FOOT SURGERY Left 05/25/2020   MASTECTOMY Left    REDUCTION MAMMAPLASTY Right    TONSILLECTOMY AND ADENOIDECTOMY  1958    Current Outpatient Medications on File Prior to Visit  Medication Sig Dispense Refill   calcium-vitamin D (OSCAL WITH D) 250-125 MG-UNIT tablet Take 1 tablet by mouth daily.      ezetimibe (ZETIA) 10 MG tablet Take 1 tablet (10 mg total) by mouth daily. 30 tablet 6   losartan (COZAAR) 50 MG tablet Take 1 tablet (50 mg total) by mouth daily. 90 tablet 3   metoprolol succinate (TOPROL-XL) 25 MG 24 hr tablet Take 1 tablet (25 mg total) by mouth daily. 90 tablet 3   montelukast (SINGULAIR) 10 MG tablet Take 1 tablet (10 mg total) by mouth at bedtime. 90 tablet 3   pantoprazole (PROTONIX) 20 MG tablet Take 1 tablet (20 mg total) by mouth daily. 90 tablet 3   No current facility-administered medications on file prior to visit.    Allergies  Allergen Reactions   Crestor [Rosuvastatin]     Myalgias    Lipitor [Atorvastatin]     myalgias    Social History:  reports that she has never smoked. She has never used smokeless tobacco. She reports that she does not use drugs.  Family History  Problem Relation Age of Onset   Heart attack Father        died from   Breast cancer Maternal Aunt    Breast cancer Paternal Aunt     The following portions of the patient's history were reviewed and updated as appropriate: allergies, current medications, past family history,  past medical history, past social history, past surgical history and problem list.  Review of Systems Pertinent items noted in HPI and remainder of comprehensive ROS otherwise negative.  Physical Exam:  BP (!) 122/57   Pulse 92   Ht 5\' 6"  (1.676 m)   Wt 226 lb (102.5 kg)   BMI 36.48 kg/m  CONSTITUTIONAL: Well-developed, well-nourished female in no acute distress.  HENT:  Normocephalic, atraumatic, External right and left ear normal.  EYES: Conjunctivae and EOM are normal. Pupils are equal, round, and reactive to light. No scleral icterus.  NECK: Normal range of motion, supple, no masses.  Normal thyroid.  SKIN: Skin is warm and dry. No rash noted. Not diaphoretic. No erythema. No pallor. MUSCULOSKELETAL: Normal range of motion. No tenderness.  No cyanosis, clubbing, or edema. NEUROLOGIC: Alert and  oriented to person, place, and time. Normal reflexes, muscle tone coordination.  PSYCHIATRIC: Normal mood and affect. Normal behavior. Normal judgment and thought content.  CARDIOVASCULAR: Normal heart rate noted, regular rhythm RESPIRATORY: Clear to auscultation bilaterally. Effort and breath sounds normal, no problems with respiration noted.  BREASTS: Evidence of left mastectomy and implant - no abnormalities on exam on this side. No masses that I could feel but tender on the outer side of her breast, skin changes, nipple drainage, or lymphadenopathy. Performed in the presence of a chaperone. ABDOMEN: Soft, no distention noted.  No tenderness, rebound or guarding.  PELVIC: not indicated; s/p hyst, neg ROS. Vulvar and external GU system inspected. Performed in the presence of a chaperone.  Assessment and Plan:    1. Encounter for annual routine gynecological examination - Cervical cancer screening: Discussed guidelines related to screening beyond 65 along with hysterectomy. Based on guidelines, the patient meets criteria for cessation of pap smears.  - Breast Health: Encouraged self-breast awareness/SBE. Discussed limits of clinical breast exam for detecting breast cancer. Discussed importance of annual MXR.  Rx given for MXR - Bone Health: Calcium via diet and supplementation. Had osteopenia in 2019.  - Colonoscopy: up to date - F/U 12 months and prn  2. Vaginal infections - Culture done today - Will do therapy pending results - Reviewed Replens for possible prevention. Discussed boric acid. Could also do vaginal estrogen if all other options do not work. Discussed safety of vaginal estrogen in s/o history of breast cancer  3. Right breast lump - recommend diagnostic mammogram     Routine preventative health maintenance measures emphasized. Please refer to After Visit Summary for other counseling recommendations.       Radene Gunning, MD, [place] [facility], [place]

## 2021-05-24 ENCOUNTER — Other Ambulatory Visit: Payer: Self-pay | Admitting: Family Medicine

## 2021-05-28 ENCOUNTER — Other Ambulatory Visit: Payer: Self-pay

## 2021-05-28 ENCOUNTER — Other Ambulatory Visit: Payer: Self-pay | Admitting: Obstetrics and Gynecology

## 2021-05-28 ENCOUNTER — Encounter: Payer: Self-pay | Admitting: Obstetrics and Gynecology

## 2021-05-28 ENCOUNTER — Other Ambulatory Visit (HOSPITAL_COMMUNITY)
Admission: RE | Admit: 2021-05-28 | Discharge: 2021-05-28 | Disposition: A | Discharge status: Home / Self Care | Payer: PPO | Source: Ambulatory Visit | Attending: Obstetrics and Gynecology | Admitting: Obstetrics and Gynecology

## 2021-05-28 ENCOUNTER — Ambulatory Visit (INDEPENDENT_AMBULATORY_CARE_PROVIDER_SITE_OTHER): Payer: PPO | Admitting: Obstetrics and Gynecology

## 2021-05-28 VITALS — BP 122/57 | HR 92 | Ht 66.0 in | Wt 226.0 lb

## 2021-05-28 DIAGNOSIS — N760 Acute vaginitis: Secondary | ICD-10-CM | POA: Diagnosis not present

## 2021-05-28 DIAGNOSIS — Z01419 Encounter for gynecological examination (general) (routine) without abnormal findings: Secondary | ICD-10-CM | POA: Diagnosis not present

## 2021-05-28 DIAGNOSIS — N6311 Unspecified lump in the right breast, upper outer quadrant: Secondary | ICD-10-CM

## 2021-05-28 MED ORDER — NYSTATIN-TRIAMCINOLONE 100000-0.1 UNIT/GM-% EX OINT
1.0000 | TOPICAL_OINTMENT | Freq: Two times a day (BID) | CUTANEOUS | 1 refills | Status: DC
Start: 2021-05-28 — End: 2021-07-28

## 2021-05-28 NOTE — Patient Instructions (Signed)
Over the counter vaginal moisturizer - Replens

## 2021-05-28 NOTE — Addendum Note (Signed)
Addended by: Radene Gunning A on: 05/28/2021 10:35 AM   Modules accepted: Orders

## 2021-05-29 ENCOUNTER — Other Ambulatory Visit: Payer: Self-pay | Admitting: Family Medicine

## 2021-05-29 ENCOUNTER — Ambulatory Visit: Payer: PPO | Admitting: Podiatry

## 2021-05-29 DIAGNOSIS — K219 Gastro-esophageal reflux disease without esophagitis: Secondary | ICD-10-CM

## 2021-05-29 DIAGNOSIS — I10 Essential (primary) hypertension: Secondary | ICD-10-CM

## 2021-05-29 DIAGNOSIS — J4520 Mild intermittent asthma, uncomplicated: Secondary | ICD-10-CM

## 2021-05-29 LAB — CERVICOVAGINAL ANCILLARY ONLY
Bacterial Vaginitis (gardnerella): POSITIVE — AB
Candida Glabrata: NEGATIVE
Candida Vaginitis: NEGATIVE
Comment: NEGATIVE
Comment: NEGATIVE
Comment: NEGATIVE

## 2021-05-29 MED ORDER — METRONIDAZOLE 0.75 % VA GEL: 1.0000 | Freq: Every day | VAGINAL | 1 refills | Status: DC

## 2021-05-29 NOTE — Addendum Note (Signed)
Addended by: Radene Gunning A on: 05/29/2021 03:12 PM   Modules accepted: Orders

## 2021-05-30 ENCOUNTER — Telehealth: Payer: Self-pay

## 2021-05-30 NOTE — Telephone Encounter (Signed)
Patient called and made aware of bacterial vaginosis. Patient picked up script last night for metrogel. Patient states that she started using it last night and also had runny BMs. Patient made aware that she most likely just happened to have them at the same time.   Patient states that she didn't pick up the  nystatin ointment because she had some leftover lotrisone she is using. Patient also states she bought some replens like Dr. Damita Dunnings reccommneded. Made patient aware that she should not do all three at the same time- I would do the full treatment of Metrogel first and then use the other remedies as instructed. Patient states understanding. Kathrene Alu RN

## 2021-06-02 ENCOUNTER — Encounter: Payer: Self-pay | Admitting: Obstetrics and Gynecology

## 2021-06-06 ENCOUNTER — Ambulatory Visit: Payer: PPO | Admitting: Family Medicine

## 2021-06-09 ENCOUNTER — Other Ambulatory Visit: Payer: Self-pay

## 2021-06-09 DIAGNOSIS — N760 Acute vaginitis: Secondary | ICD-10-CM

## 2021-06-09 MED ORDER — METRONIDAZOLE 0.75 % VA GEL: 1.0000 | Freq: Every day | VAGINAL | 1 refills | Status: DC

## 2021-06-09 NOTE — Progress Notes (Signed)
[place] Phone: [phone] Subjective:   [name], am serving as a scribe for Dr. Hulan Saas. This visit occurred during the SARS-CoV-2 public health emergency.  Safety protocols were in place, including screening questions prior to the visit, additional usage of staff PPE, and extensive cleaning of exam room while observing appropriate contact time as indicated for disinfecting solutions.  I'm seeing this patient by the request  of:  [name], DO  CC: Back pain follow-up  [id number]  AMIEE WILEY is a 70 y.o. female coming in with complaint of back and neck pain. OMT 03/26/2021. Patient states that she has been having thoracic and cervical spine pain due to wrapping gifts.  Has noticed some increasing stiffness.  Patient states some mild discomfort in the low back but nothing as severe as what she has had previously.  States that this feels more muscle.  No radiation down the arms.  Medications patient has been prescribed: None  Taking:         Reviewed prior external information including notes and imaging from previsou exam, outside providers and external EMR if available.   As well as notes that were available from care everywhere and other healthcare systems.  Past medical history, social, surgical and family history all reviewed in electronic medical record.  No pertanent information unless stated regarding to the chief complaint.   Past Medical History:  Diagnosis Date   Hypertension    Osteopenia     Allergies  Allergen Reactions   Crestor [Rosuvastatin]     Myalgias    Lipitor [Atorvastatin]     myalgias     Review of Systems:  No headache, visual changes, nausea, vomiting, diarrhea, constipation, dizziness, abdominal pain, skin rash, fevers, chills, night sweats, weight loss, swollen lymph nodes, body aches, joint swelling, chest pain, shortness of breath,  mood changes. POSITIVE muscle aches  Objective  Blood pressure 120/70, pulse 87, height 5\' 6"  (1.676 m), weight 227 lb (103 kg), SpO2 98 %.   General: No apparent distress alert and oriented x3 mood and affect normal, dressed appropriately.  HEENT: Pupils equal, extraocular movements intact  Respiratory: Patient's speak in full sentences and does not appear short of breath  Cardiovascular: No lower extremity edema, non tender, no erythema  Neck exam does have tenderness more secondary to the musculature.  Patient does have some limitation with sidebending.  Negative Spurling's.  5 out of 5 strength of the upper extremities. Does have tightness noted in the parascapular regions bilaterally but seems to be more on the left greater than right.  Osteopathic findings  C2 flexed rotated and side bent right C6 flexed rotated and side bent left T8 extended rotated and side bent left inhaled rib L2 flexed rotated and side bent right Sacrum right on right       Assessment and Plan:  Low back pain Low back exam is overall stable.  Mild exacerbation of the upper thoracic spine.  Responding extremely well to osteopathic manipulation.  Discussed posture and ergonomics.  Patient wanted to hold on prescription medications but did discuss the possibility of muscle relaxers.  Follow-up with me again in 6 to 8 weeks  Chronic left-sided thoracic back pain Seems to be more in the thoracic area.  Discussed with patient to monitor.  Different posture and ergonomics that I think will be beneficial.  Likely exacerbated secondary to the repetitive motions.  Follow-up again in  6 to 8 weeks    Nonallopathic problems  Decision today to treat with OMT was based on Physical Exam  After verbal consent patient was treated with HVLA, ME, FPR techniques in cervical, rib, thoracic, lumbar, and sacral  areas  Patient tolerated the procedure well with improvement in symptoms  Patient given exercises, stretches  and lifestyle modifications  See medications in patient instructions if given  Patient will follow up in 4-8 weeks     The above documentation has been reviewed and is accurate and complete Lyndal Pulley, DO        Note: This dictation was prepared with Dragon dictation along with smaller phrase technology. Any transcriptional errors that result from this process are unintentional.

## 2021-06-10 ENCOUNTER — Other Ambulatory Visit: Payer: Self-pay

## 2021-06-10 ENCOUNTER — Ambulatory Visit: Payer: PPO | Admitting: Family Medicine

## 2021-06-10 ENCOUNTER — Other Ambulatory Visit: Payer: Self-pay | Admitting: Obstetrics and Gynecology

## 2021-06-10 VITALS — BP 120/70 | HR 87 | Ht 66.0 in | Wt 227.0 lb

## 2021-06-10 DIAGNOSIS — M9901 Segmental and somatic dysfunction of cervical region: Secondary | ICD-10-CM

## 2021-06-10 DIAGNOSIS — M9904 Segmental and somatic dysfunction of sacral region: Secondary | ICD-10-CM | POA: Diagnosis not present

## 2021-06-10 DIAGNOSIS — M9903 Segmental and somatic dysfunction of lumbar region: Secondary | ICD-10-CM | POA: Diagnosis not present

## 2021-06-10 DIAGNOSIS — G8929 Other chronic pain: Secondary | ICD-10-CM | POA: Diagnosis not present

## 2021-06-10 DIAGNOSIS — M546 Pain in thoracic spine: Secondary | ICD-10-CM | POA: Diagnosis not present

## 2021-06-10 DIAGNOSIS — M9908 Segmental and somatic dysfunction of rib cage: Secondary | ICD-10-CM

## 2021-06-10 DIAGNOSIS — M9902 Segmental and somatic dysfunction of thoracic region: Secondary | ICD-10-CM

## 2021-06-10 DIAGNOSIS — M545 Low back pain, unspecified: Secondary | ICD-10-CM

## 2021-06-10 DIAGNOSIS — N760 Acute vaginitis: Secondary | ICD-10-CM

## 2021-06-10 MED ORDER — METRONIDAZOLE 0.75 % VA GEL: 1.0000 | VAGINAL | 1 refills | Status: DC

## 2021-06-10 NOTE — Assessment & Plan Note (Signed)
Seems to be more in the thoracic area.  Discussed with patient to monitor.  Different posture and ergonomics that I think will be beneficial.  Likely exacerbated secondary to the repetitive motions.  Follow-up again in 6 to 8 weeks

## 2021-06-10 NOTE — Assessment & Plan Note (Signed)
Low back exam is overall stable.  Mild exacerbation of the upper thoracic spine.  Responding extremely well to osteopathic manipulation.  Discussed posture and ergonomics.  Patient wanted to hold on prescription medications but did discuss the possibility of muscle relaxers.  Follow-up with me again in 6 to 8 weeks

## 2021-06-10 NOTE — Patient Instructions (Signed)
Good to see you! Thanks for waiting for me Tell hubby to go to the snore room See you again in 5 weeks

## 2021-06-17 ENCOUNTER — Ambulatory Visit: Payer: PPO | Admitting: Family Medicine

## 2021-06-18 ENCOUNTER — Other Ambulatory Visit: Payer: Self-pay

## 2021-06-18 DIAGNOSIS — B379 Candidiasis, unspecified: Secondary | ICD-10-CM

## 2021-06-18 MED ORDER — FLUCONAZOLE 150 MG PO TABS
ORAL_TABLET | ORAL | 1 refills | Status: DC
Start: 2021-06-18 — End: 2021-07-28

## 2021-06-25 ENCOUNTER — Encounter: Payer: Self-pay | Admitting: Family Medicine

## 2021-06-25 ENCOUNTER — Telehealth: Payer: Self-pay | Admitting: Family Medicine

## 2021-06-25 ENCOUNTER — Ambulatory Visit (INDEPENDENT_AMBULATORY_CARE_PROVIDER_SITE_OTHER): Payer: PPO | Admitting: Family Medicine

## 2021-06-25 VITALS — BP 122/84 | HR 99 | Temp 98.1°F | Ht 67.0 in | Wt 225.4 lb

## 2021-06-25 DIAGNOSIS — K219 Gastro-esophageal reflux disease without esophagitis: Secondary | ICD-10-CM | POA: Diagnosis not present

## 2021-06-25 DIAGNOSIS — R059 Cough, unspecified: Secondary | ICD-10-CM | POA: Diagnosis not present

## 2021-06-25 LAB — POC COVID19 BINAXNOW: SARS Coronavirus 2 Ag: NEGATIVE

## 2021-06-25 MED ORDER — PANTOPRAZOLE SODIUM 40 MG PO TBEC: 40.0000 mg | DELAYED_RELEASE_TABLET | Freq: Every day | ORAL | 1 refills | Status: DC

## 2021-06-25 NOTE — Progress Notes (Signed)
Chief Complaint  Patient presents with   lab work today if needed    GYN for post menopausal women     Subjective: Patient is a 71 y.o. female here for f/u.  Spouse went out of town over [place] and was around someone who had Covid. The day following his return, she started having runny nose, chest tightness, SOB, coughing at night, sour taste in back of mouth, diarrhea for 1 d, and fatigue. She tested neg for covid. Her spouse's aunt recently passed away from covid and they are going to travel soon.   Past Medical History:  Diagnosis Date   Hypertension    Osteopenia     Objective: BP 122/84    Pulse 99    Temp 98.1 F (36.7 C) (Oral)    Ht 5\' 7"  (1.702 m)    Wt 225 lb 6 oz (102.2 kg)    SpO2 98%    BMI 35.30 kg/m  General: Awake, appears stated age Heart: RRR, no LE edema HEENT: ears patent, TM's neg, mmm, no pharyngeal edema or erythema, nares patent w/o rhinorrhea Lungs: CTAB, no rales, wheezes or rhonchi. No accessory muscle use Abd: BS+, S, NT, ND Psych: Age appropriate judgment and insight, normal affect and mood  Assessment and Plan: Gastroesophageal reflux disease, unspecified whether esophagitis present - Plan: pantoprazole (PROTONIX) 40 MG tablet  Cough, unspecified type - Plan: POC COVID-19  Chronic, not controlled. Increase Protonix to 40 mg/d. F/u in 1 mo. Refer to GI if no improvement.  Ck Covid, neg today. Sounds like issues are related to above and AE of PO Flagyl.  The patient voiced understanding and agreement to the plan.  [place], DO 06/25/21  1:36 PM

## 2021-06-25 NOTE — Telephone Encounter (Signed)
Pt was wondering if dr. Nani Ravens remembered the name of the dr that specializes in post menopausal gynecology that he recommended. Please advise.

## 2021-06-25 NOTE — Patient Instructions (Addendum)
Consider RePhresh, which is a  vaginal probiotic, that can sometimes help with recurrent BV infections.   Consider adding Pepcid 20 mg twice daily to the Protonix.   The only lifestyle changes that have data behind them are weight loss for the overweight/obese and elevating the head of the bed. Finding out which foods/positions are triggers is important.  Stay hydrated.  Let us know if you need anything.

## 2021-06-25 NOTE — Telephone Encounter (Signed)
That is the office she has been going to and does not want to return there

## 2021-07-03 NOTE — Progress Notes (Signed)
[place] Phone: [phone] Subjective:   [name], am serving as a scribe for Dr. Hulan Saas.This visit occurred during the SARS-CoV-2 public health emergency.  Safety protocols were in place, including screening questions prior to the visit, additional usage of staff PPE, and extensive cleaning of exam room while observing appropriate contact time as indicated for disinfecting solutions.  I'm seeing this patient by the request  of:  [name], DO  CC: Neck and back pain follow-up  [id number]  Tonya [name] is a 71 y.o. female coming in with complaint of back and neck pain. OMT on 06/10/2021. Patient states she traveled to [place] and since then her neck on L side and L scapula have been bothering her. Pain can be stabbing at times.   Medications patient has been prescribed: None  Taking:         Reviewed prior external information including notes and imaging from previsou exam, outside providers and external EMR if available.   As well as notes that were available from care everywhere and other healthcare systems.  Past medical history, social, surgical and family history all reviewed in electronic medical record.  No pertanent information unless stated regarding to the chief complaint.   Past Medical History:  Diagnosis Date   Hypertension    Osteopenia     Allergies  Allergen Reactions   Crestor [Rosuvastatin]     Myalgias    Lipitor [Atorvastatin]     myalgias     Review of Systems:  No headache, visual changes, nausea, vomiting, diarrhea, constipation, dizziness, abdominal pain, skin rash, fevers, chills, night sweats, weight loss, swollen lymph nodes, body aches, joint swelling, chest pain, shortness of breath, mood changes. POSITIVE muscle aches  Objective  Blood pressure 118/82, pulse 78, height 5\' 7"  (1.702 m), weight 224 lb (101.6 kg), SpO2 98 %.    General: No apparent distress alert and oriented x3 mood and affect normal, dressed appropriately.  HEENT: Pupils equal, extraocular movements intact  Respiratory: Patient's speak in full sentences and does not appear short of breath  Cardiovascular: No lower extremity edema, non tender, no erythema  Patient does have some mild increase in kyphosis noted.  Some limited sidebending of the neck bilaterally.  Tightness noted in the left subscapularis area.  Patient also has tightness in the thoracolumbar juncture.  Tightness with FABER test bilaterally.  Tightness with straight leg test but no true radicular symptoms.  Osteopathic findings  C6 flexed rotated and side bent left T5 extended rotated and side bent left inhaled rib T9 extended rotated and side bent left L2 flexed rotated and side bent right Sacrum right on right       Assessment and Plan:  Low back pain Chronic pain with mild exacerbation.  Patient has been traveling and unfortunately did have a death in the family.  Discussed which activities to do which wants to avoid.  Increase activity slowly.  Follow-up again in 6 to 8 weeks.  Does respond relatively well to osteopathic manipulation.    Nonallopathic problems  Decision today to treat with OMT was based on Physical Exam  After verbal consent patient was treated with HVLA, ME, FPR techniques in cervical, rib, thoracic, lumbar, and sacral  areas  Patient tolerated the procedure well with improvement in symptoms  Patient given exercises, stretches and lifestyle modifications  See medications in patient instructions if given  Patient will follow up in 4-8  weeks      The above documentation has been reviewed and is accurate and complete Lyndal Pulley, DO        Note: This dictation was prepared with Dragon dictation along with smaller phrase technology. Any transcriptional errors that result from this process are unintentional.

## 2021-07-04 ENCOUNTER — Ambulatory Visit: Payer: PPO | Admitting: Family Medicine

## 2021-07-04 ENCOUNTER — Encounter: Payer: Self-pay | Admitting: Family Medicine

## 2021-07-04 ENCOUNTER — Other Ambulatory Visit: Payer: Self-pay

## 2021-07-04 ENCOUNTER — Ambulatory Visit
Admission: RE | Admit: 2021-07-04 | Discharge: 2021-07-04 | Disposition: A | Discharge status: Home / Self Care | Payer: PPO | Source: Ambulatory Visit | Attending: Obstetrics and Gynecology | Admitting: Obstetrics and Gynecology

## 2021-07-04 VITALS — BP 118/82 | HR 78 | Ht 67.0 in | Wt 224.0 lb

## 2021-07-04 DIAGNOSIS — M9904 Segmental and somatic dysfunction of sacral region: Secondary | ICD-10-CM | POA: Diagnosis not present

## 2021-07-04 DIAGNOSIS — M9902 Segmental and somatic dysfunction of thoracic region: Secondary | ICD-10-CM | POA: Diagnosis not present

## 2021-07-04 DIAGNOSIS — M9903 Segmental and somatic dysfunction of lumbar region: Secondary | ICD-10-CM | POA: Diagnosis not present

## 2021-07-04 DIAGNOSIS — R922 Inconclusive mammogram: Secondary | ICD-10-CM | POA: Diagnosis not present

## 2021-07-04 DIAGNOSIS — M545 Low back pain, unspecified: Secondary | ICD-10-CM

## 2021-07-04 DIAGNOSIS — M9908 Segmental and somatic dysfunction of rib cage: Secondary | ICD-10-CM | POA: Diagnosis not present

## 2021-07-04 DIAGNOSIS — M9901 Segmental and somatic dysfunction of cervical region: Secondary | ICD-10-CM | POA: Diagnosis not present

## 2021-07-04 DIAGNOSIS — N6311 Unspecified lump in the right breast, upper outer quadrant: Secondary | ICD-10-CM

## 2021-07-04 DIAGNOSIS — Z853 Personal history of malignant neoplasm of breast: Secondary | ICD-10-CM | POA: Diagnosis not present

## 2021-07-04 DIAGNOSIS — G8929 Other chronic pain: Secondary | ICD-10-CM

## 2021-07-04 IMAGING — US US BREAST*R* LIMITED INC AXILLA
1 series · 5 of 5 positions shown · non-contrast
Comparison: Previous exam(s).
COMPARISON: Previous exam(s).

Addendum:
CLINICAL DATA: 70-year-old female with palpable thickening in the
OUTER RIGHT breast discovered on self-examination. History of LEFT
breast cancer and LEFT mastectomy.

EXAM:
DIGITAL DIAGNOSTIC UNILATERAL RIGHT MAMMOGRAM WITH TOMOSYNTHESIS AND
CAD
TECHNIQUE: Right digital diagnostic mammography and breast tomosynthesis was
performed. The images were evaluated with computer-aided detection.

[Series 1: us breast*right* limited inc axilla · 0.07mm/px · 5 of 5 slices shown]
[im 1/5]
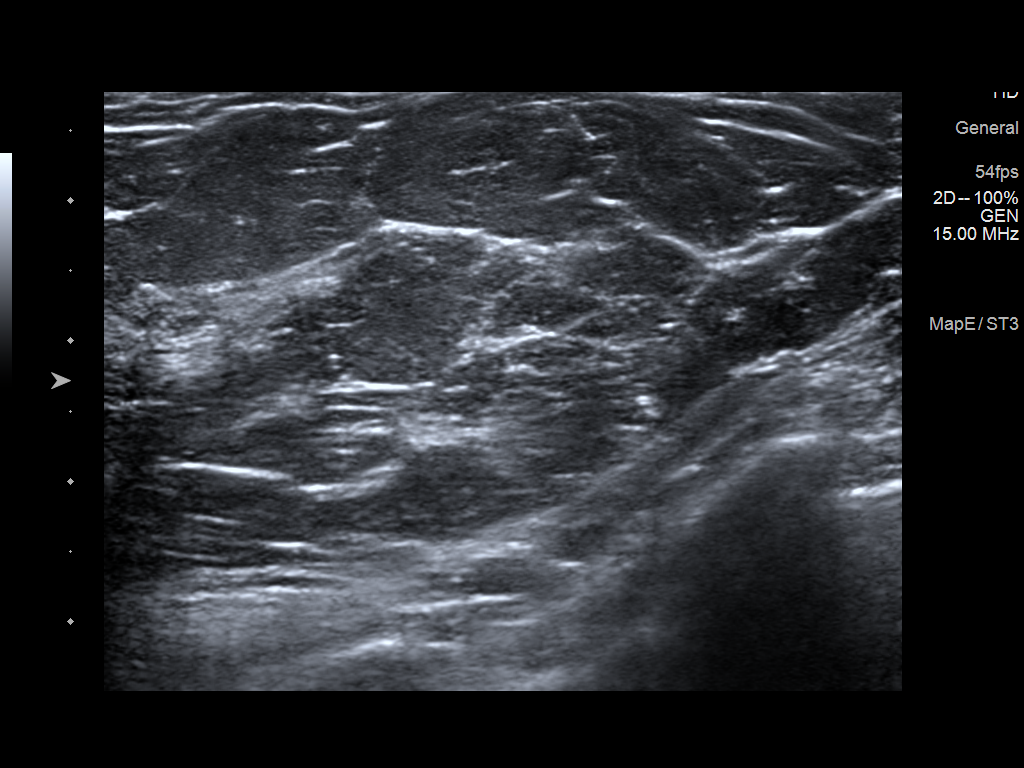
[im 2/5]
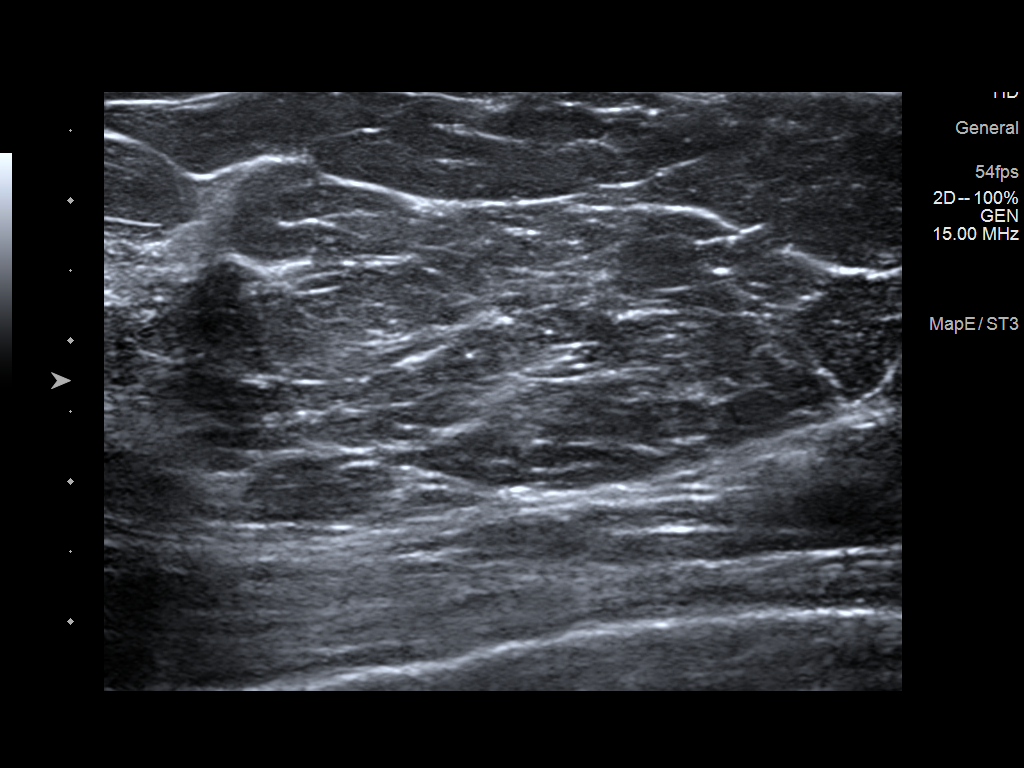
[im 3/5]
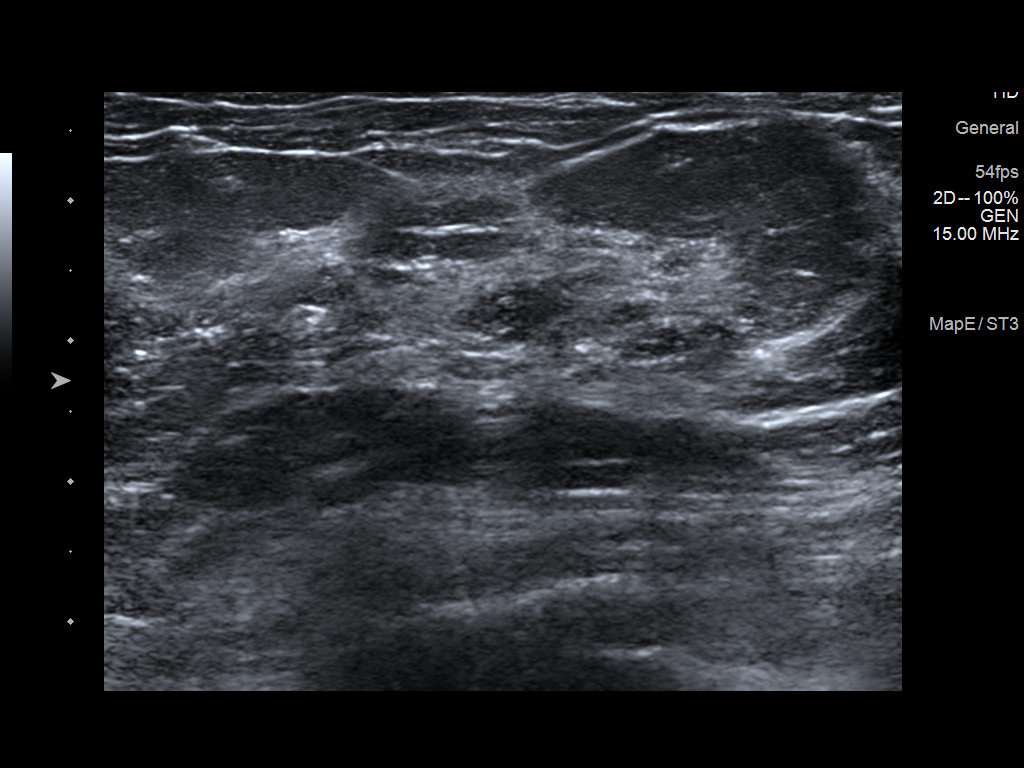
[im 4/5]
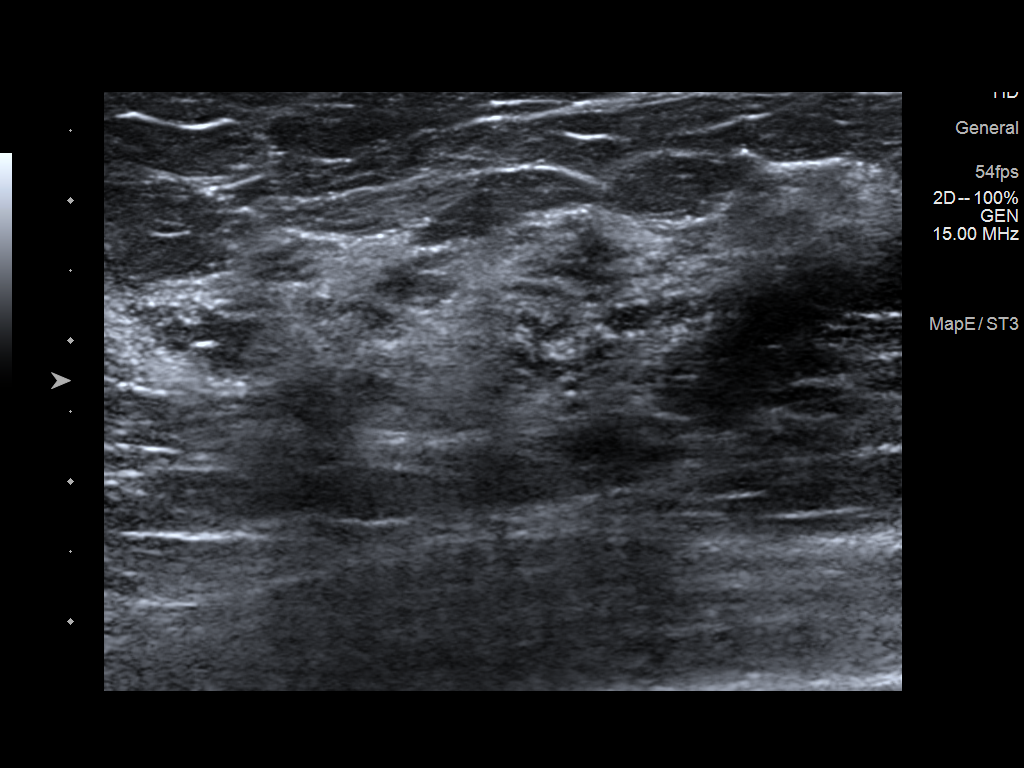
[im 5/5]
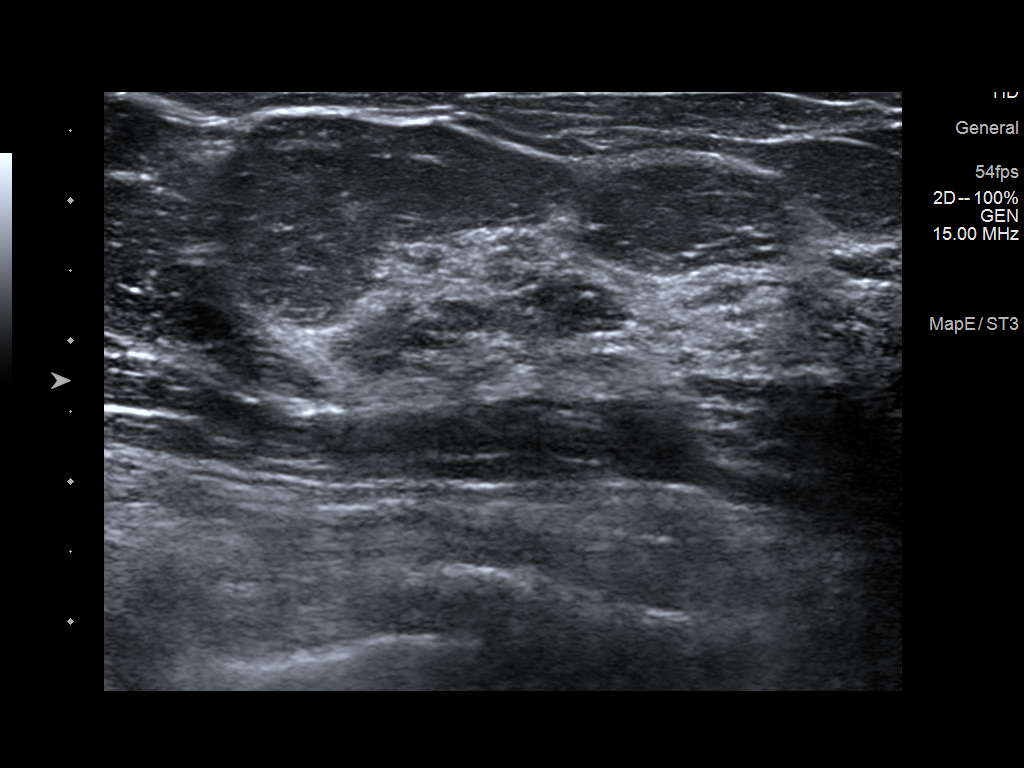

[5 of 5 positions shown; findings below may reference images not displayed]

ACR Breast Density Category b: There are scattered areas of
fibroglandular density.
FINDINGS: 2D/3D full field and spot compression views of the RIGHT breast
demonstrate no suspicious mammographic abnormality. COIL biopsy clip
again noted.

On physical exam, minimal thickening in the OUTER RIGHT breast
without discrete palpable mass.

Targeted ultrasound is performed, showing no suspicious mass,
distortion or worrisome shadowing within the OUTER RIGHT breast, in
the area of patient concern. Normal dense fibroglandular tissue in
this area is noted.
IMPRESSION: 1. No suspicious mammographic or sonographic abnormalities within
the OUTER RIGHT breast, in the area of patient concern.

RECOMMENDATION:
RIGHT screening mammogram in 1 year.

I have discussed the findings and recommendations with the patient.
If applicable, a reminder letter will be sent to the patient
regarding the next appointment.

BI-RADS CATEGORY  1: Negative.

ADDENDUM:
RIGHT diagnostic mammogram and RIGHT breast ultrasound was
performed.

*** End of Addendum ***
ACR Breast Density Category b: There are scattered areas of
fibroglandular density.
FINDINGS: 2D/3D full field and spot compression views of the RIGHT breast
demonstrate no suspicious mammographic abnormality. COIL biopsy clip
again noted.

On physical exam, minimal thickening in the OUTER RIGHT breast
without discrete palpable mass.

Targeted ultrasound is performed, showing no suspicious mass,
distortion or worrisome shadowing within the OUTER RIGHT breast, in
the area of patient concern. Normal dense fibroglandular tissue in
this area is noted.
IMPRESSION: 1. No suspicious mammographic or sonographic abnormalities within
the OUTER RIGHT breast, in the area of patient concern.

RECOMMENDATION:
RIGHT screening mammogram in 1 year.

I have discussed the findings and recommendations with the patient.
If applicable, a reminder letter will be sent to the patient
regarding the next appointment.

BI-RADS CATEGORY  1: Negative.

## 2021-07-04 IMAGING — MG MM DIGITAL DIAGNOSTIC UNILAT*R* W/ TOMO W/ CAD
6 series · 6 of 18 positions shown · non-contrast
Comparison: Previous exam(s).
COMPARISON: Previous exam(s).

Addendum:
CLINICAL DATA: 70-year-old female with palpable thickening in the
OUTER RIGHT breast discovered on self-examination. History of LEFT
breast cancer and LEFT mastectomy.

EXAM:
DIGITAL DIAGNOSTIC UNILATERAL RIGHT MAMMOGRAM WITH TOMOSYNTHESIS AND
CAD
TECHNIQUE: Right digital diagnostic mammography and breast tomosynthesis was
performed. The images were evaluated with computer-aided detection.

[R MLO synth-2D]
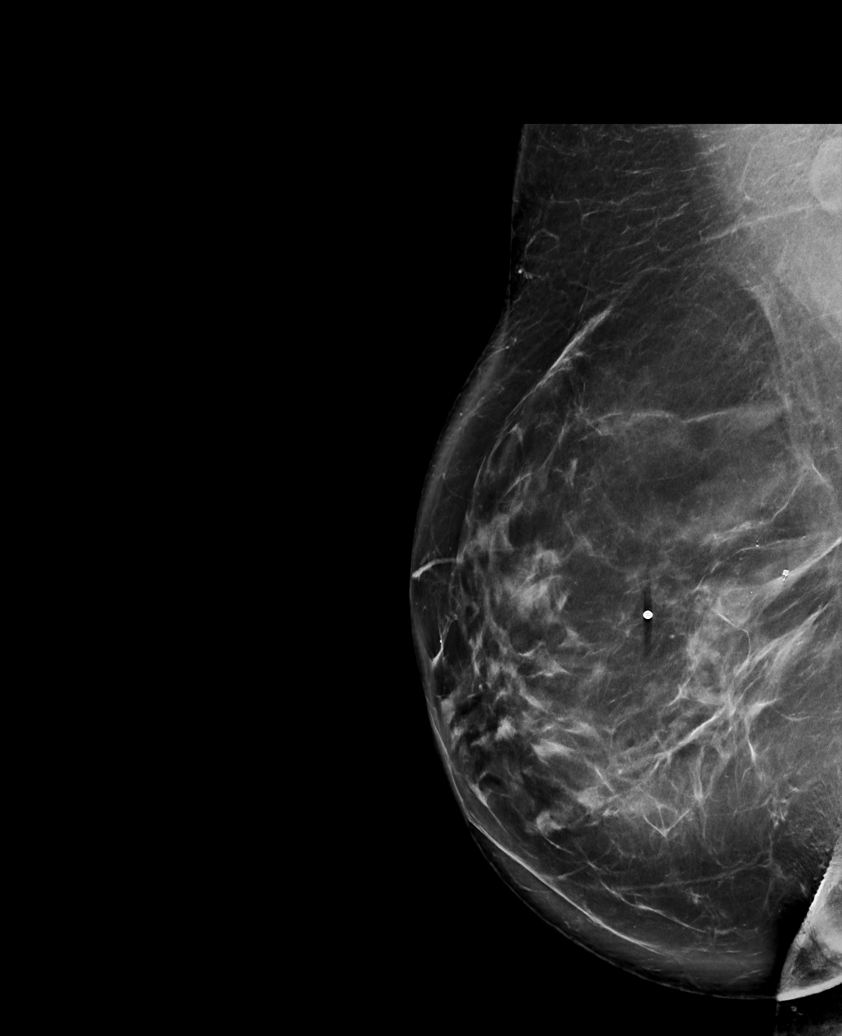

[R CC synth-2D]
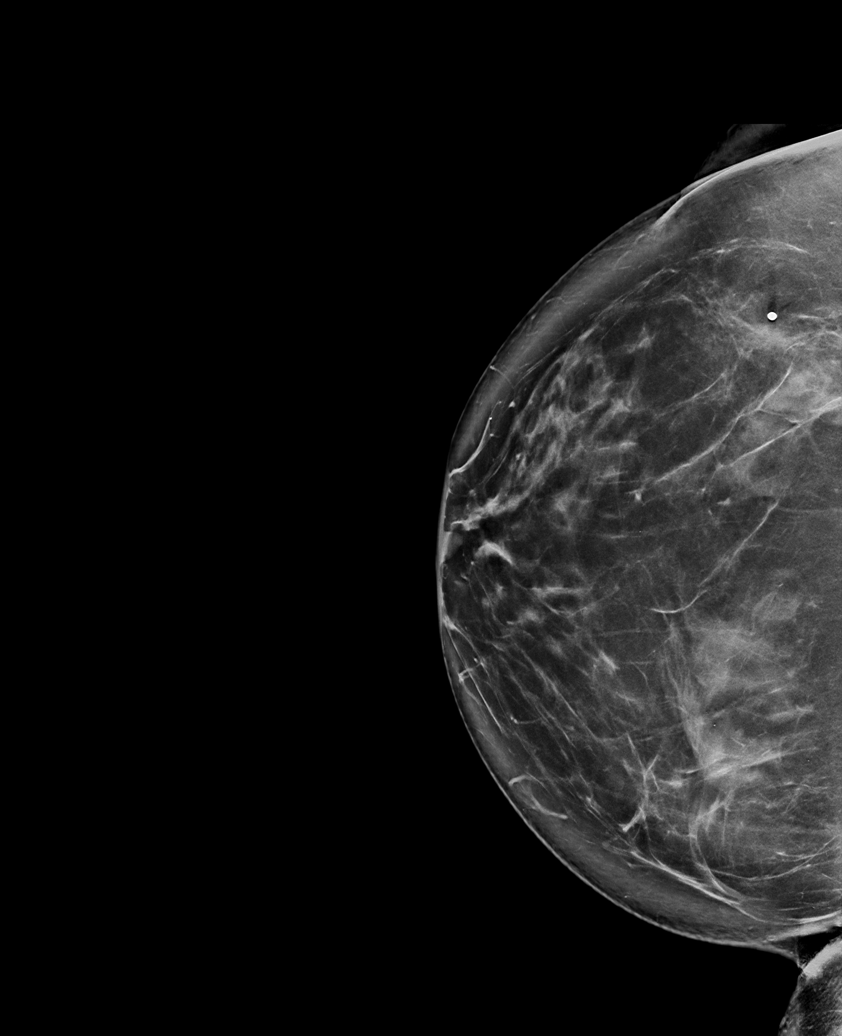

[R TAN synth-2D]
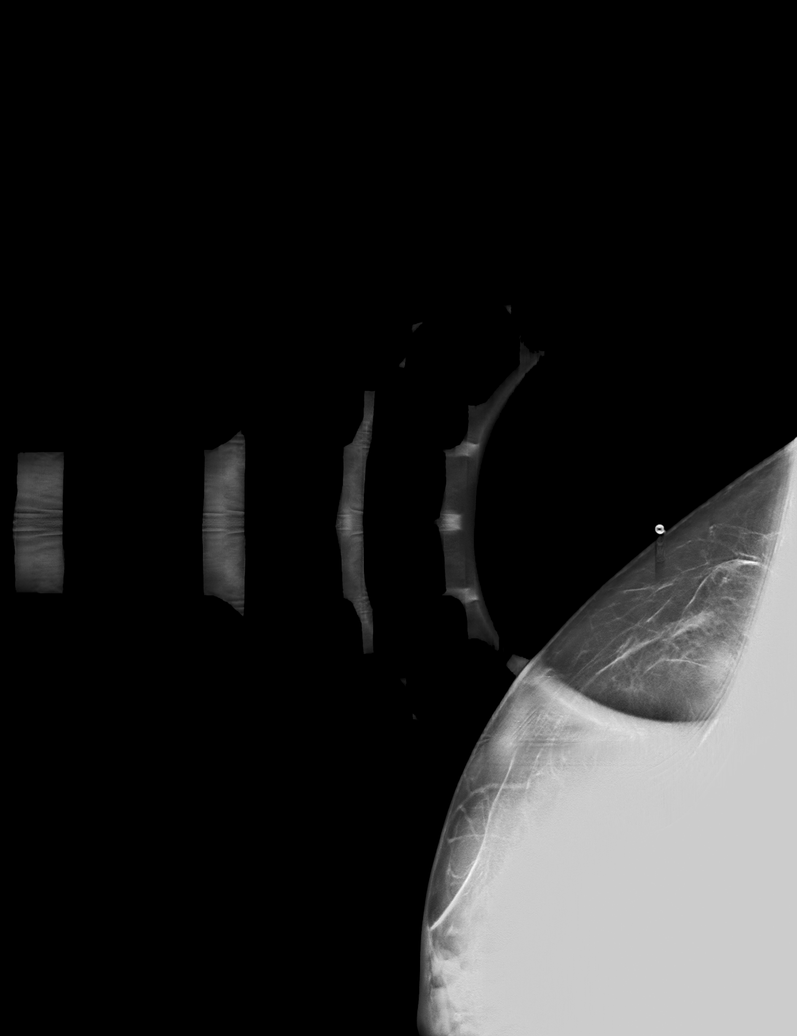

[R CC tomo · tomo slice 53/104.0]
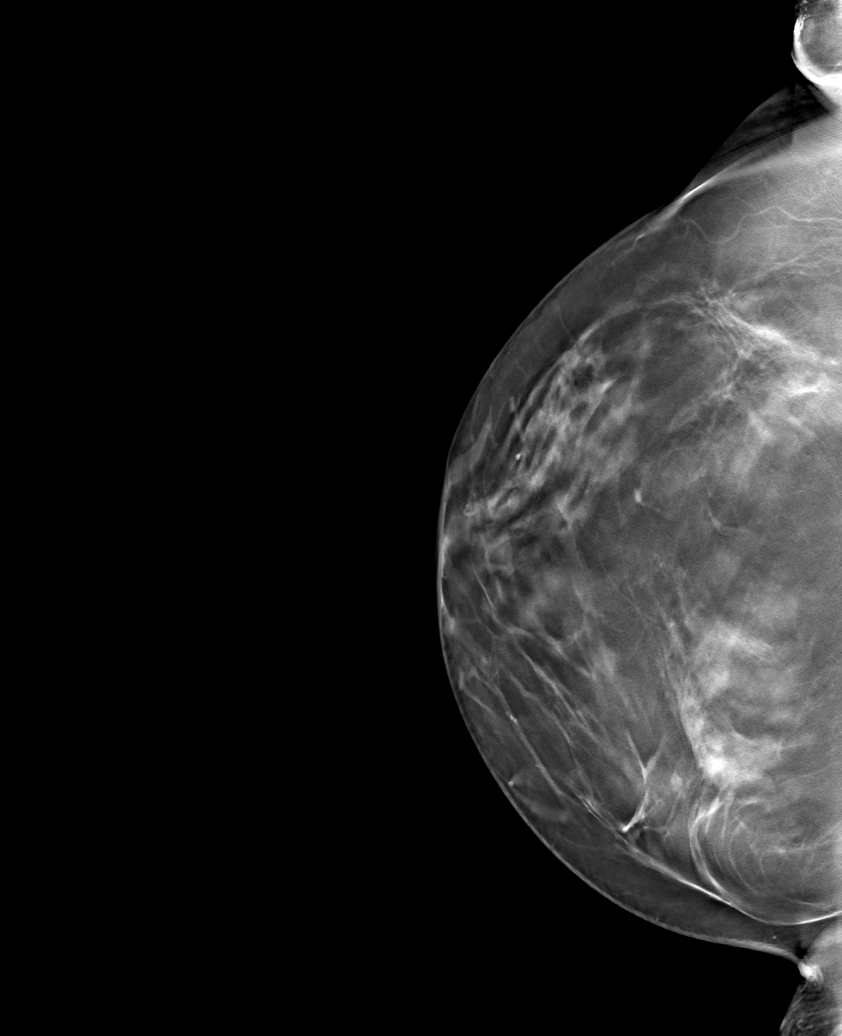

[R MLO tomo · tomo slice 55/110.0]
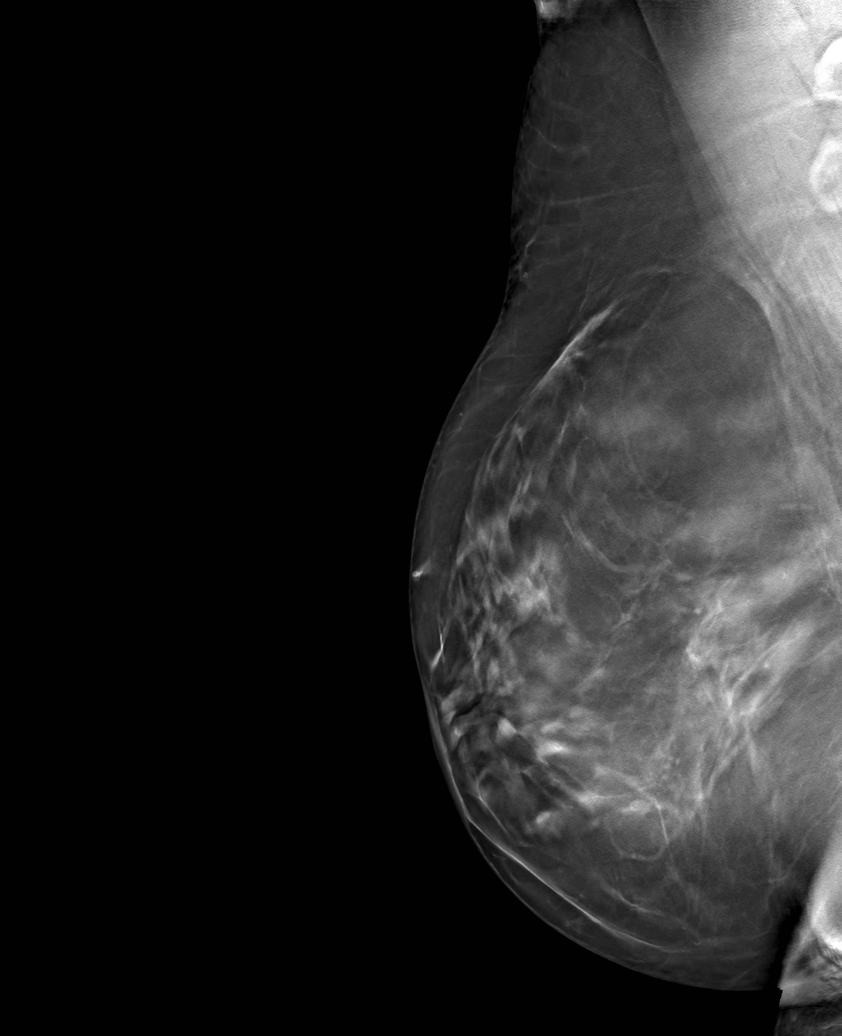

[R TAN tomo · tomo slice 27/53.0]
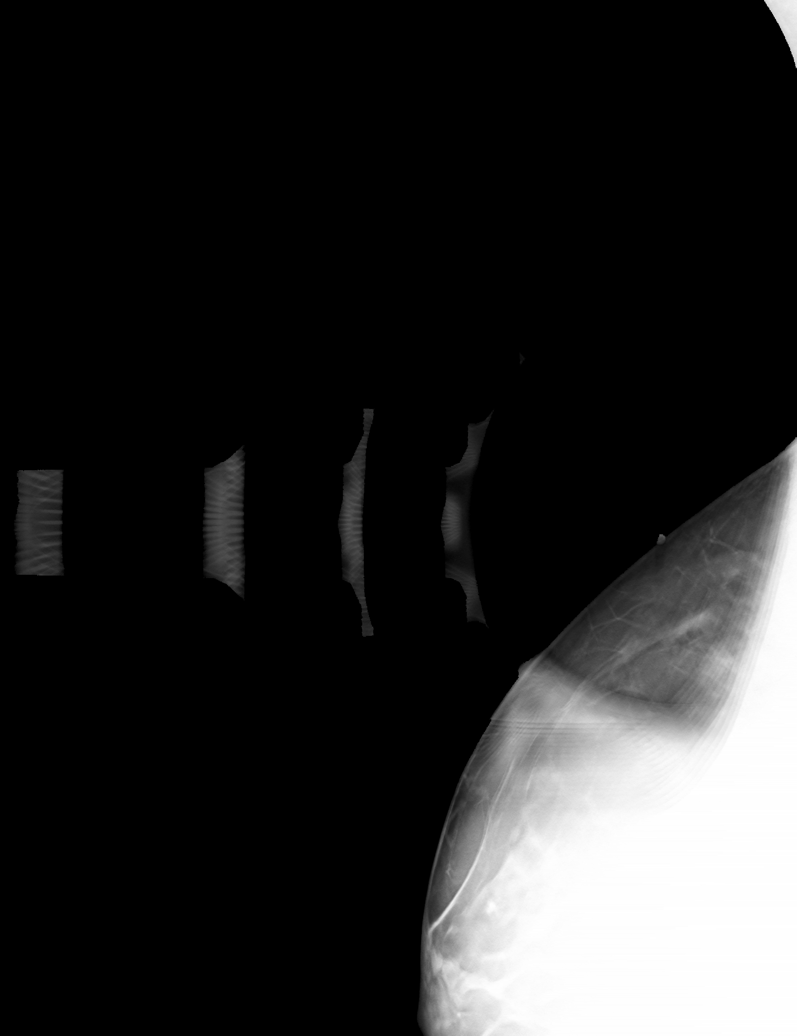

[6 of 18 positions shown; findings below may reference images not displayed]

ACR Breast Density Category b: There are scattered areas of
fibroglandular density.
FINDINGS: 2D/3D full field and spot compression views of the RIGHT breast
demonstrate no suspicious mammographic abnormality. COIL biopsy clip
again noted.

On physical exam, minimal thickening in the OUTER RIGHT breast
without discrete palpable mass.

Targeted ultrasound is performed, showing no suspicious mass,
distortion or worrisome shadowing within the OUTER RIGHT breast, in
the area of patient concern. Normal dense fibroglandular tissue in
this area is noted.
IMPRESSION: 1. No suspicious mammographic or sonographic abnormalities within
the OUTER RIGHT breast, in the area of patient concern.

RECOMMENDATION:
RIGHT screening mammogram in 1 year.

I have discussed the findings and recommendations with the patient.
If applicable, a reminder letter will be sent to the patient
regarding the next appointment.

BI-RADS CATEGORY  1: Negative.

ADDENDUM:
RIGHT diagnostic mammogram and RIGHT breast ultrasound was
performed.

*** End of Addendum ***
ACR Breast Density Category b: There are scattered areas of
fibroglandular density.
FINDINGS: 2D/3D full field and spot compression views of the RIGHT breast
demonstrate no suspicious mammographic abnormality. COIL biopsy clip
again noted.

On physical exam, minimal thickening in the OUTER RIGHT breast
without discrete palpable mass.

Targeted ultrasound is performed, showing no suspicious mass,
distortion or worrisome shadowing within the OUTER RIGHT breast, in
the area of patient concern. Normal dense fibroglandular tissue in
this area is noted.
IMPRESSION: 1. No suspicious mammographic or sonographic abnormalities within
the OUTER RIGHT breast, in the area of patient concern.

RECOMMENDATION:
RIGHT screening mammogram in 1 year.

I have discussed the findings and recommendations with the patient.
If applicable, a reminder letter will be sent to the patient
regarding the next appointment.

BI-RADS CATEGORY  1: Negative.

## 2021-07-04 NOTE — Assessment & Plan Note (Signed)
Chronic pain with mild exacerbation.  Patient has been traveling and unfortunately did have a death in the family.  Discussed which activities to do which wants to avoid.  Increase activity slowly.  Follow-up again in 6 to 8 weeks.  Does respond relatively well to osteopathic manipulation.

## 2021-07-04 NOTE — Patient Instructions (Signed)
Hope you can get back in routine See me in 6 weeks

## 2021-07-09 ENCOUNTER — Ambulatory Visit: Payer: PPO | Admitting: Family Medicine

## 2021-07-10 ENCOUNTER — Ambulatory Visit: Payer: PPO | Admitting: Family Medicine

## 2021-07-15 ENCOUNTER — Ambulatory Visit: Payer: PPO | Admitting: Family Medicine

## 2021-07-17 ENCOUNTER — Encounter: Payer: PPO | Admitting: Psychology

## 2021-07-22 NOTE — Telephone Encounter (Signed)
Called the patient back and she will discuss with her insurance if a referral is needed//where she is covered.  She will see PCP on Monday and discuss.

## 2021-07-22 NOTE — Telephone Encounter (Signed)
If she wants to go to [facility] that would be an option, could try a different [facility] team. OK to place new referral if she wishes to see another group. Ty.

## 2021-07-22 NOTE — Telephone Encounter (Signed)
Pt called today and wanted to know if there is anyone else Dr. Nani Ravens can recommend/refer her to for post menopausal bacterial issues, itching, burning, etc.  She stated she has an appt coming up next week with him for something else and does not want to wait or hold up a referral process on this and wanted to ask about this immediately.  Please advise.

## 2021-07-22 NOTE — Telephone Encounter (Signed)
Called left message to call back 

## 2021-07-23 ENCOUNTER — Other Ambulatory Visit: Payer: Self-pay

## 2021-07-23 ENCOUNTER — Encounter: Payer: Self-pay | Admitting: Family Medicine

## 2021-07-23 ENCOUNTER — Other Ambulatory Visit (HOSPITAL_COMMUNITY)
Admission: RE | Admit: 2021-07-23 | Discharge: 2021-07-23 | Disposition: A | Discharge status: Home / Self Care | Payer: PPO | Source: Ambulatory Visit | Attending: Family Medicine | Admitting: Family Medicine

## 2021-07-23 ENCOUNTER — Ambulatory Visit: Payer: PPO | Admitting: Family Medicine

## 2021-07-23 VITALS — BP 114/61 | HR 82 | Ht 67.0 in | Wt 224.0 lb

## 2021-07-23 DIAGNOSIS — N9089 Other specified noninflammatory disorders of vulva and perineum: Secondary | ICD-10-CM | POA: Diagnosis not present

## 2021-07-23 DIAGNOSIS — D239 Other benign neoplasm of skin, unspecified: Secondary | ICD-10-CM | POA: Diagnosis not present

## 2021-07-23 DIAGNOSIS — N898 Other specified noninflammatory disorders of vagina: Secondary | ICD-10-CM

## 2021-07-23 DIAGNOSIS — Z85828 Personal history of other malignant neoplasm of skin: Secondary | ICD-10-CM | POA: Diagnosis not present

## 2021-07-23 DIAGNOSIS — L820 Inflamed seborrheic keratosis: Secondary | ICD-10-CM | POA: Diagnosis not present

## 2021-07-23 DIAGNOSIS — Z129 Encounter for screening for malignant neoplasm, site unspecified: Secondary | ICD-10-CM | POA: Diagnosis not present

## 2021-07-23 NOTE — Progress Notes (Signed)
° °  Subjective:    Patient ID: Tonya [name], female    DOB: 11-28-1950, 71 y.o.   MRN: [id number]  HPI Patient seen for recurrent vaginal irritation and discharge. She was seen by my partner, Dr Damita Dunnings, in December. They discussed starting boric acid, which she has not done yet. The last culture showed BV, which she took metrogel for. It seems like she is off the metrogel for a couple of days, has sex, and her symptoms return.  Today, she is having a lot of external irritation as well - burning and itching. She has been trying not to scratch. And has been placing wet towel on the vulva to help cool the sensation. She is currently using a prescription of lotrisone that I had prescribed a couple years ago.   Review of Systems     Objective:   Physical Exam Vitals reviewed. Exam conducted with a chaperone present.  Constitutional:      Appearance: Normal appearance.  Genitourinary:   Neurological:     Mental Status: She is alert.      Assessment & Plan:   1. Vaginal discharge   2. Vulvar irritation    Lotrisone bid with coconut oil to help reduce dryness, which is likely contributing to the irritation. I recommended using boric acid nightly for the next couple of weeks to improve vaginal pH and help to suppress yeast and BV. I also recommended that her partner uses a condom during sex to help with the pH imbalance that semen can cause. She is doubtful that her partner will be willing to use a condom - in this case, recommended use of boric acid after sex to help reduce the chances of recurrant BV/yeast.

## 2021-07-24 ENCOUNTER — Other Ambulatory Visit: Payer: Self-pay | Admitting: Family Medicine

## 2021-07-24 ENCOUNTER — Encounter: Payer: Self-pay | Admitting: Family Medicine

## 2021-07-24 DIAGNOSIS — K219 Gastro-esophageal reflux disease without esophagitis: Secondary | ICD-10-CM

## 2021-07-24 LAB — CERVICOVAGINAL ANCILLARY ONLY
Bacterial Vaginitis (gardnerella): POSITIVE — AB
Candida Glabrata: NEGATIVE
Candida Vaginitis: NEGATIVE
Chlamydia: NEGATIVE
Comment: NEGATIVE
Comment: NEGATIVE
Comment: NEGATIVE
Comment: NEGATIVE
Comment: NEGATIVE
Comment: NORMAL
Neisseria Gonorrhea: NEGATIVE
Trichomonas: NEGATIVE

## 2021-07-24 MED ORDER — METRONIDAZOLE 0.75 % VA GEL: 1.0000 | Freq: Every day | VAGINAL | 5 refills | Status: DC

## 2021-07-24 NOTE — Addendum Note (Signed)
Addended by: Truett Mainland on: 07/24/2021 05:24 PM   Modules accepted: Orders

## 2021-07-28 ENCOUNTER — Ambulatory Visit (INDEPENDENT_AMBULATORY_CARE_PROVIDER_SITE_OTHER): Payer: PPO | Admitting: Family Medicine

## 2021-07-28 ENCOUNTER — Encounter: Payer: Self-pay | Admitting: Family Medicine

## 2021-07-28 VITALS — BP 112/72 | HR 77 | Temp 97.9°F | Ht 67.0 in | Wt 223.4 lb

## 2021-07-28 DIAGNOSIS — R0982 Postnasal drip: Secondary | ICD-10-CM

## 2021-07-28 DIAGNOSIS — R058 Other specified cough: Secondary | ICD-10-CM

## 2021-07-28 DIAGNOSIS — E7800 Pure hypercholesterolemia, unspecified: Secondary | ICD-10-CM | POA: Diagnosis not present

## 2021-07-28 DIAGNOSIS — K219 Gastro-esophageal reflux disease without esophagitis: Secondary | ICD-10-CM | POA: Diagnosis not present

## 2021-07-28 LAB — LIPID PANEL
Cholesterol: 182 mg/dL (ref 0–200)
HDL: 62.6 mg/dL (ref >39.00)
LDL Cholesterol: 101 mg/dL — ABNORMAL HIGH (ref 0–99)
NonHDL: 119.07
Total CHOL/HDL Ratio: 3
Triglycerides: 89 mg/dL (ref 0.0–149.0)
VLDL: 17.8 mg/dL (ref 0.0–40.0)

## 2021-07-28 LAB — HEPATIC FUNCTION PANEL
ALT: 27 U/L (ref 0–35)
AST: 26 U/L (ref 0–37)
Albumin: 4.3 g/dL (ref 3.5–5.2)
Alkaline Phosphatase: 63 U/L (ref 39–117)
Bilirubin, Direct: 0.2 mg/dL (ref 0.0–0.3)
Total Bilirubin: 1.1 mg/dL (ref 0.2–1.2)
Total Protein: 7 g/dL (ref 6.0–8.3)

## 2021-07-28 MED ORDER — PANTOPRAZOLE SODIUM 20 MG PO TBEC: 20.0000 mg | DELAYED_RELEASE_TABLET | Freq: Every day | ORAL | 2 refills | Status: DC

## 2021-07-28 MED ORDER — ATENOLOL 50 MG PO TABS: 50.0000 mg | ORAL_TABLET | Freq: Every day | ORAL | 3 refills | Status: DC

## 2021-07-28 MED ORDER — FEXOFENADINE HCL 60 MG PO TABS: 60.0000 mg | ORAL_TABLET | Freq: Two times a day (BID) | ORAL | 2 refills | Status: DC

## 2021-07-28 NOTE — Progress Notes (Signed)
Chief Complaint  Patient presents with   Follow-up    GERD Tonya [name] is a 71 y.o. female who presents for follow up of GERD. Takes Protonix 40 mg/d, recently increased from 20 mg/d.  S/s's include ST worse at night, coughing, wheezing, some sob w exertion, post nasal drainage, clearing throat. She denies belching and eructation, abdominal bloating, heartburn, bilious reflux, pain water brash Aggravating factors and specific triggers include laying down/night Alleviating factors include none Risk factors present for GERD include caffeine use and obesity.  Taking Singulair. Zyrtec, Claritin and Xyzal dried her out.   Hyperlipidemia Patient presents for hyperlipidemia follow up. Currently being treated with Zetia 10 mg daily and compliance with treatment thus far has been good. She denies myalgias. She is usually adhering to a healthy diet. No chest pain or shortness of breath  Past Medical History:  Diagnosis Date   Hypertension    Osteopenia     Exam BP 112/72    Pulse 77    Temp 97.9 F (36.6 C) (Oral)    Ht 5\' 7"  (1.702 m)    Wt 223 lb 6 oz (101.3 kg)    SpO2 96%    BMI 34.99 kg/m  General:  well developed, well nourished, in no apparent distress Lungs:  clear to auscultation, breath sounds equal bilaterally, no respiratory distress, no wheezes Cardio:  regular rate and rhythm without murmurs, heart sounds without clicks or rubs Abdomen:  abdomen soft, nontender; bowel sounds normal; no masses or organomegaly Psych: Normal affect and mood  Assessment and Plan  Upper airway cough syndrome  Post-nasal drainage  Gastroesophageal reflux disease, unspecified whether esophagitis present  Pure hypercholesterolemia - Plan: Hepatic function panel, Lipid panel  1/2.  Trial of Allegra 60 mg 1-2x daily.  Recommended Flonase which she does not want to do.  She is already seen an allergist.  3.  Low back down to 20 mg of Protonix daily.  Counseled on GERD diet and  precautions 4.  Chronic, unsure if stable.  Continue Zetia 10 mg daily, check labs. Pt voiced understanding and agreement to the plan.  [place], DO 07/28/21  11:59 AM

## 2021-07-28 NOTE — Patient Instructions (Addendum)
Stretch your neck.   Claritin (loratadine), Allegra (fexofenadine), Zyrtec (cetirizine) which is also equivalent to Xyzal (levocetirizine); these are listed in order from weakest to strongest. Generic, and therefore cheaper, options are in the parentheses.   Flonase (fluticasone); nasal spray that is over the counter. 2 sprays each nostril, once daily. Aim towards the same side eye when you spray.  There are available OTC, and the generic versions, which may be cheaper, are in parentheses. Show this to a pharmacist if you have trouble finding any of these items.  Foods that may reduce pain: 1) Ginger 2) Blueberries 3) Salmon 4) Pumpkin seeds 5) dark chocolate 6) turmeric 7) tart cherries 8) virgin olive oil 9) chilli peppers 10) mint  Let us know if you need anything.

## 2021-07-30 ENCOUNTER — Other Ambulatory Visit: Payer: Self-pay

## 2021-07-30 ENCOUNTER — Ambulatory Visit (INDEPENDENT_AMBULATORY_CARE_PROVIDER_SITE_OTHER): Payer: PPO | Admitting: Family Medicine

## 2021-07-30 ENCOUNTER — Telehealth: Payer: Self-pay | Admitting: *Deleted

## 2021-07-30 ENCOUNTER — Other Ambulatory Visit: Payer: Self-pay | Admitting: Family Medicine

## 2021-07-30 ENCOUNTER — Encounter: Payer: PPO | Admitting: Psychology

## 2021-07-30 ENCOUNTER — Telehealth: Payer: Self-pay | Admitting: Cardiology

## 2021-07-30 ENCOUNTER — Encounter: Payer: Self-pay | Admitting: Family Medicine

## 2021-07-30 ENCOUNTER — Ambulatory Visit (INDEPENDENT_AMBULATORY_CARE_PROVIDER_SITE_OTHER): Payer: PPO

## 2021-07-30 VITALS — BP 118/76 | HR 86 | Temp 98.4°F

## 2021-07-30 DIAGNOSIS — S99922A Unspecified injury of left foot, initial encounter: Secondary | ICD-10-CM

## 2021-07-30 IMAGING — DX DG TOE 4TH 2+V*L*
3 series · 3 of 3 positions shown · non-contrast
Comparison: Left foot radiographs [DATE]

CLINICAL DATA: Toe injury. Stumped fourth toe against chair leg
earlier today.

EXAM:
LEFT FOURTH TOE

[toe ap]
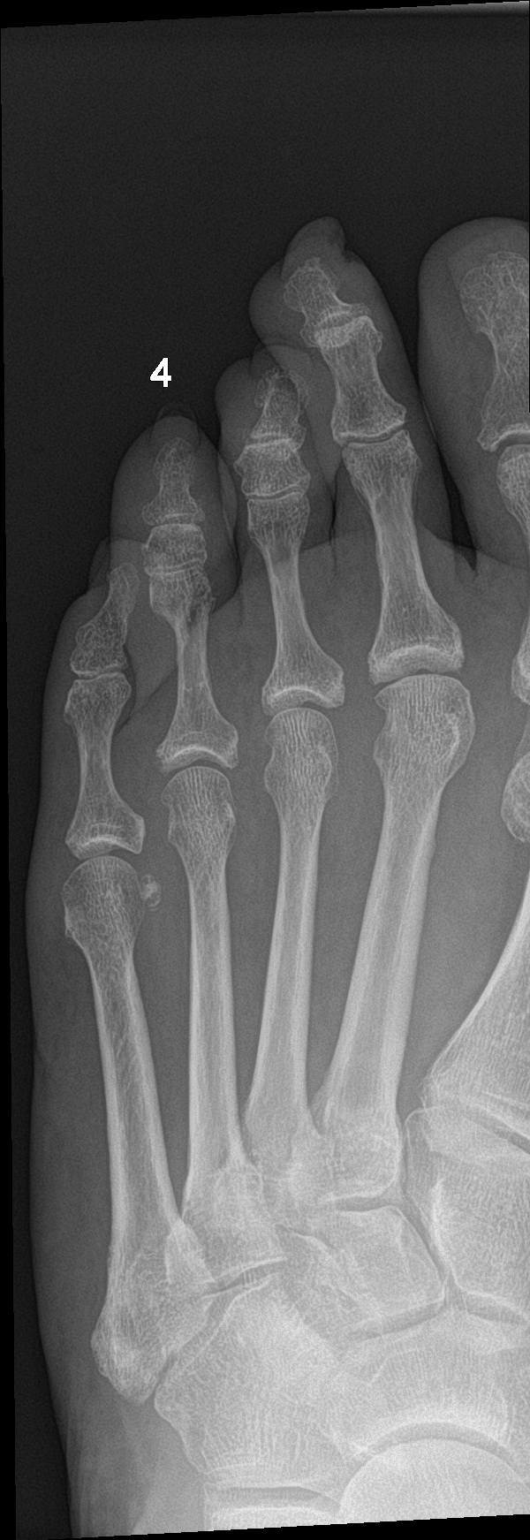

[toe obl]
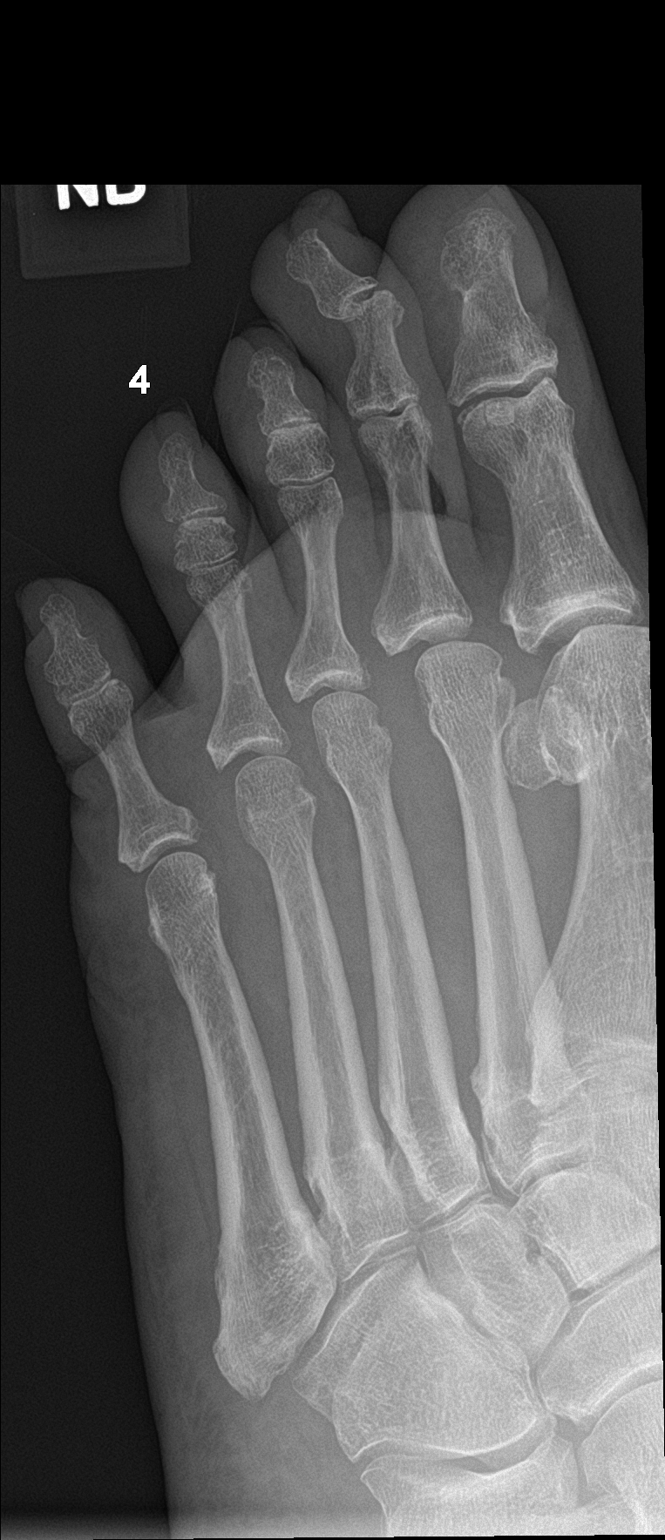

[toe lat]
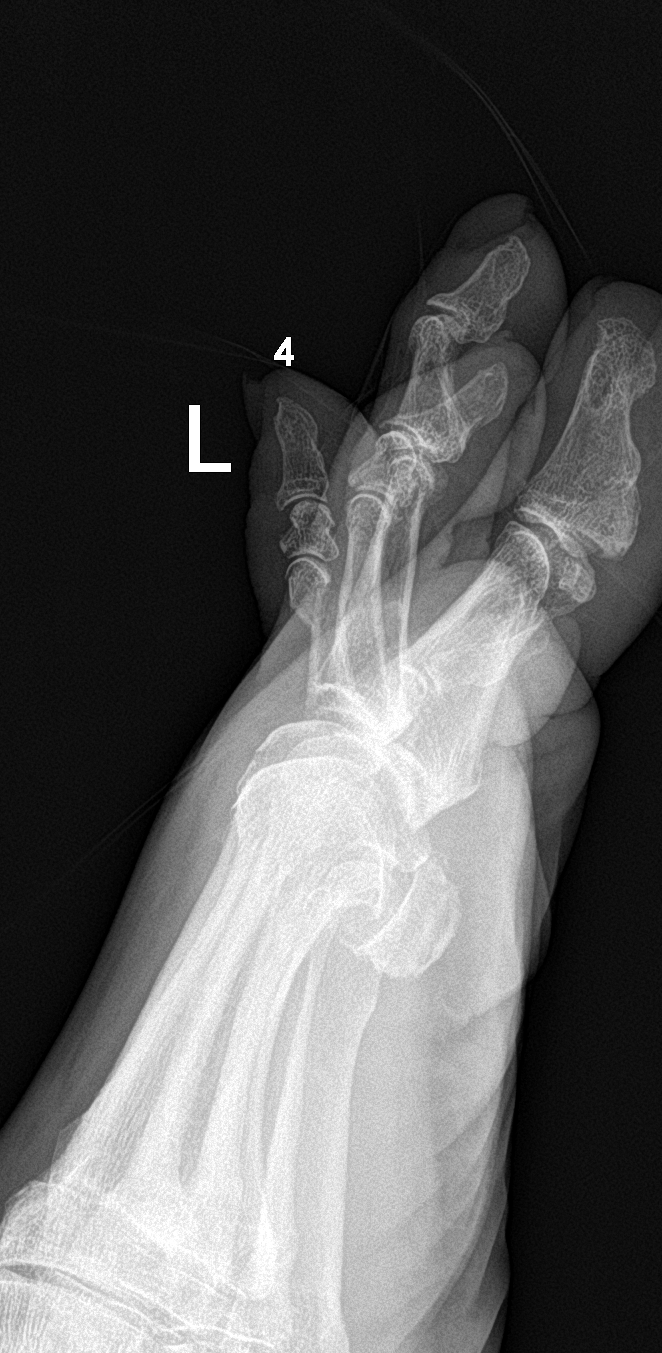

[3 of 3 positions shown; findings below may reference images not displayed]

FINDINGS: There is diffuse decreased bone mineralization. On lateral view
there is mild curvilinear lucency at the proximal dorsal aspect of
the middle phalanx of the fourth toe.

There also appears to be an oblique lucency within the distal aspect
of the distal shaft of fourth toe on lateral view, with mild plantar
apex angulation of a small acute fracture.

No dislocation.

Mild joint space narrowing of the interphalangeal joints diffusely.
IMPRESSION: Likely acute fracture of the distal shaft of the proximal phalanx of
fourth toe with mild plantar apex angulation of the fracture line.

Possible adjacent nondisplaced fracture at the dorsal base of the
middle phalanx of the fourth toe.

## 2021-07-30 NOTE — Patient Instructions (Addendum)
Ice/cold pack over area for 10-15 min twice daily.  OK to take Tylenol 1000 mg (2 extra strength tabs) or 975 mg (3 regular strength tabs) every 6 hours as needed.  Continue buddy taping. Athletic tape or coban would work.   X-ray: [address] [place], [place] [id number]; the order is in, we will be in touch regarding the results.   Wear the flat shoe as long as it is painful.   Let us know if you need anything.

## 2021-07-30 NOTE — Telephone Encounter (Signed)
Returned call to pt and informed her that she needs to work on her cholesterol since her LDL is >100 and should be below 70. Verbalized understanding and will look for low cholesterol-heart healthy diet. Sent via [facility].

## 2021-07-30 NOTE — Telephone Encounter (Signed)
Patient injured her toe this morning, hurting quite a bit. Please schedule for appointment.

## 2021-07-30 NOTE — Progress Notes (Signed)
Musculoskeletal Exam  Patient: Tonya [name] DXIPJAS DOB: 12-22-1950  DOS: 07/30/2021  SUBJECTIVE:  Chief Complaint:   Chief Complaint  Patient presents with   Toe Injury    Tonya [name] is a 70 y.o.  female for evaluation and treatment of toe pain.   Onset: Today, she was walking and struck her foot into the leg of a sofa. Location: L 4th toe Character:  aching and sharp  Progression of issue:  is unchanged Associated symptoms: Swelling, bruising Treatment: to date has been rest and ice.   Neurovascular symptoms: no  Past Medical History:  Diagnosis Date   Hypertension    Osteopenia     Objective: VITAL SIGNS: BP 118/76    Pulse 86    Temp 98.4 F (36.9 C) (Oral)    SpO2 97%  Constitutional: Well formed, well developed. No acute distress. Thorax & Lungs: No accessory muscle use Musculoskeletal: L 4th toe.   Tenderness to palpation: Yes, worse over the proximal phalanx Deformity: yes, soft tissue swelling noted Ecchymosis: yes No crepitus Neurologic: Normal sensory function.  Antalgic gait Psychiatric: Normal mood. Age appropriate judgment and insight. Alert & oriented x 3.    Assessment:  Injury of toe on left foot, initial encounter - Plan: DG Toe 4th Left  Plan: Flat soled shoe given.  Check x-ray, ice, Tylenol.  She politely declined stronger pain medication and crutches. F/u pending above. The patient voiced understanding and agreement to the plan.   [place], DO 07/30/21  4:43 PM

## 2021-07-30 NOTE — Telephone Encounter (Signed)
Patient saw her PCP on 2/6, she had lab done by him.  She wants to know if she needs to come in to see Dr. Gardiner Rhyme since her PCP did a lipid panel and check her liver.   She states her cholesterol went down.

## 2021-08-01 ENCOUNTER — Encounter: Payer: Self-pay | Admitting: Family Medicine

## 2021-08-01 ENCOUNTER — Ambulatory Visit (INDEPENDENT_AMBULATORY_CARE_PROVIDER_SITE_OTHER): Payer: PPO | Admitting: Podiatry

## 2021-08-01 ENCOUNTER — Other Ambulatory Visit: Payer: Self-pay

## 2021-08-01 ENCOUNTER — Encounter: Payer: Self-pay | Admitting: Podiatry

## 2021-08-01 ENCOUNTER — Other Ambulatory Visit: Payer: Self-pay | Admitting: Family Medicine

## 2021-08-01 DIAGNOSIS — S92345G Nondisplaced fracture of fourth metatarsal bone, left foot, subsequent encounter for fracture with delayed healing: Secondary | ICD-10-CM

## 2021-08-01 DIAGNOSIS — S92502A Displaced unspecified fracture of left lesser toe(s), initial encounter for closed fracture: Secondary | ICD-10-CM | POA: Diagnosis not present

## 2021-08-01 NOTE — Progress Notes (Signed)
°  Subjective:  Patient ID: JAQUETTA CURRIER, female    DOB: Feb 16, 1951,   MRN: [id number]  Chief Complaint  Patient presents with   Fracture       left foot fracture/ xray done and in epic, brusing has gotten worse.   Toe Pain    71 y.o. female presents for concern of fracture fourth digit on the right foot. Was seen in urgent care a couple days ago and given a shoe to offload the area. Patient had tried to be seen with [place] earlier but was unable to get in as quickly. Relates the toe has been feeling better and bruising has improved.   . Denies any other pedal complaints. Denies n/v/f/c.   Past Medical History:  Diagnosis Date   Hypertension    Osteopenia     Objective:  Physical Exam: Vascular: DP/PT pulses 2/4 bilateral. CFT <3 seconds. Normal hair growth on digits. No edema.  Skin. No lacerations or abrasions bilateral feet. Mild ecchymosis noted to the dorsal fourth digit and dosral sulcus of the foot.  Musculoskeletal: MMT 5/5 bilateral lower extremities in DF, PF, Inversion and Eversion. Deceased ROM in DF of ankle joint.  Neurological: Sensation intact to light touch.   Assessment:   1. Closed fracture of fourth toe of left foot, initial encounter      Plan:  Patient was evaluated and treated and all questions answered. -Xrays reviewed from 2/8. Show fracture through the proximal phalanx and middle phalanx of fourth digit. Mildly displaced on lateral view but no severe angulation of toe.  -Discussed treatement options for toe fracture; risks, alternatives, and benefits explained. -Discussed taping of toe.  -Dispensed surgical shoe. Patient to wear at all times and instructed on use -Recommend protection, rest, ice, elevation daily until symptoms improve -Rx pain med/antinflammatories as needed -Patient to return to office in 4 weeks for serial x-rays to assess healing  or sooner if condition worsens.   Lorenda Peck, DPM

## 2021-08-04 ENCOUNTER — Ambulatory Visit: Payer: PPO | Admitting: Podiatry

## 2021-08-04 ENCOUNTER — Ambulatory Visit: Payer: PPO | Admitting: Cardiology

## 2021-08-04 ENCOUNTER — Telehealth: Payer: Self-pay | Admitting: *Deleted

## 2021-08-04 NOTE — Telephone Encounter (Signed)
Spoke with patient, notified patient of what Dr. Blenda Mounts said

## 2021-08-04 NOTE — Telephone Encounter (Signed)
Patient is calling because she is experiencing nerve pain and burning on bottom of foot since Friday, wakes her up in the middle of the night, has been applying ice. Please advise.

## 2021-08-04 NOTE — Telephone Encounter (Signed)
Keep applying ice over the area. Keep the foot elevated. She can try using tylenol or ibuprofen for the pain. This should improve but if she continues to have pain over the next week she should come in a see me. Thanks

## 2021-08-06 ENCOUNTER — Telehealth: Payer: Self-pay | Admitting: *Deleted

## 2021-08-06 NOTE — Telephone Encounter (Signed)
Patient is calling because her foot on the top is now hurting, cannot sleep at night. Please advise.

## 2021-08-07 ENCOUNTER — Telehealth: Payer: Self-pay

## 2021-08-07 ENCOUNTER — Telehealth: Payer: Self-pay | Admitting: Podiatry

## 2021-08-07 DIAGNOSIS — E785 Hyperlipidemia, unspecified: Secondary | ICD-10-CM

## 2021-08-07 NOTE — Telephone Encounter (Signed)
Pt called and is having severe nerve pain at night on her left foot. She said they did not do the Xray of the top of the foot and that is where she is having the pain.she states there is a white bump on top of her foot. She is scheduled to see Dr Blenda Mounts tomorrow in [place] location @ 215.

## 2021-08-07 NOTE — Telephone Encounter (Signed)
Pt left a msg on our office machine stating they received a message from kristin alvstad to go over lab results. I will route to her directly

## 2021-08-07 NOTE — Telephone Encounter (Signed)
Thank you I will follow up with her tomorrow

## 2021-08-07 NOTE — Telephone Encounter (Signed)
She stated the nerve pain is keeping her up at night. During the day she is having pain but not the nerve pain.

## 2021-08-08 ENCOUNTER — Ambulatory Visit: Payer: PPO | Admitting: Podiatry

## 2021-08-08 ENCOUNTER — Other Ambulatory Visit: Payer: Self-pay

## 2021-08-08 ENCOUNTER — Encounter: Payer: Self-pay | Admitting: Podiatry

## 2021-08-08 DIAGNOSIS — G5792 Unspecified mononeuropathy of left lower limb: Secondary | ICD-10-CM

## 2021-08-08 DIAGNOSIS — S92502A Displaced unspecified fracture of left lesser toe(s), initial encounter for closed fracture: Secondary | ICD-10-CM | POA: Diagnosis not present

## 2021-08-08 MED ORDER — GABAPENTIN 300 MG PO CAPS: 300.0000 mg | ORAL_CAPSULE | Freq: Every day | ORAL | 0 refills | Status: DC

## 2021-08-08 NOTE — Progress Notes (Signed)
°  Subjective:  Patient ID: Tonya [name], female    DOB: 1951/01/20,   MRN: [id number]  Chief Complaint  Patient presents with   Foot Pain    Left foot pain    71 y.o. female presents for follow-up of fracture fourth digit on the right foot. Relates she has been having sharp severe pain on the top of her foot that mostly occurs at night.   . Denies any other pedal complaints. Denies n/v/f/c.   Past Medical History:  Diagnosis Date   Hypertension    Osteopenia     Objective:  Physical Exam: Vascular: DP/PT pulses 2/4 bilateral. CFT <3 seconds. Normal hair growth on digits. No edema.  Skin. No lacerations or abrasions bilateral feet. Mild ecchymosis noted to the dorsal fourth digit and dosral sulcus of the foot.  Musculoskeletal: MMT 5/5 bilateral lower extremities in DF, PF, Inversion and Eversion. Deceased ROM in DF of ankle joint. Some tenderness noted over the dorsum of the fourth metatarsal. Edema and discoloration noted in this area.  Neurological: Sensation intact to light touch.   Assessment:   1. Closed fracture of fourth toe of left foot, initial encounter   2. Neuritis of foot, left       Plan:  Patient was evaluated and treated and all questions answered. -Xrays reviewed from 2/8. Show fracture through the proximal phalanx and middle phalanx of fourth digit. Mildly displaced on lateral view but no severe angulation of toe.  -Discussed treatement options for toe fracture; risks, alternatives, and benefits explained. -Continue taping toe.  -Continue surgical shoe.  -Recommend protection, rest, ice, elevation daily until symptoms improve -Rx pain med/antinflammatories as needed -Gabapentin sent to pharmacy.  -Discussed topical nerve creams as well. Discussed if this problems persists may need to discuss with PCP about back pain and may need referral to neurology.  -Patient to return to office in 3  weeks for serial x-rays to assess healing  or sooner if condition  worsens.   Lorenda Peck, DPM

## 2021-08-08 NOTE — Telephone Encounter (Signed)
Patient called back.  Advised that LFTs normal on Zetia.  LDL reduced to 101 which is improvement but above goal of <70.  Recommended PCSK9i and patient agreeable.  Scheduled patient for 09/19/21

## 2021-08-12 ENCOUNTER — Ambulatory Visit: Payer: PPO | Admitting: Family Medicine

## 2021-08-12 MED ORDER — REPATHA SURECLICK 140 MG/ML ~~LOC~~ SOAJ: 140.0000 mg | SUBCUTANEOUS | 11 refills | Status: DC

## 2021-08-12 NOTE — Addendum Note (Signed)
Addended by: Allean Found on: 08/12/2021 10:16 AM   Modules accepted: Orders

## 2021-08-12 NOTE — Telephone Encounter (Signed)
Called and spoke to pt and stated that they were approved for repatha, rx sent, pt instructed to complete fastig labs post 4th dose and to call if unaffordable.

## 2021-08-12 NOTE — Telephone Encounter (Signed)
Spoke with patient.  We had discussed PCSK-9 possibility at her November visit if ezetimibe didn't get to goal.  She recalls discussion and willing to try Repatha injections.  Will start PA process and cancel March OV.  Patient aware to continue with ezetimibe  [place], please start PA for Repatha Sureclick 366 mg K15T, with lipid/liver labs after 4-6 doses.

## 2021-08-14 ENCOUNTER — Telehealth: Payer: Self-pay | Admitting: *Deleted

## 2021-08-14 ENCOUNTER — Telehealth: Payer: Self-pay | Admitting: Podiatry

## 2021-08-14 NOTE — Telephone Encounter (Signed)
Patient has been called and instructions given per Dr Loraine Grip understanding and wanted to know if she can get the softer night boot to sleep in?

## 2021-08-14 NOTE — Telephone Encounter (Signed)
Pt has taken the gabapentin and it is no longer working. She is not sleeping at night and ice not working and elevation make the pain worse. Is there something you can give her to help with the pain. She states the 2nd and 3rd toe are painful not the toe that is broken.

## 2021-08-14 NOTE — Telephone Encounter (Signed)
Patient is calling because the Gabapentin is not working any longer,worked for about 3-4 days. Could she get a boot that's a little lighter weight. Please advise.

## 2021-08-15 ENCOUNTER — Telehealth (INDEPENDENT_AMBULATORY_CARE_PROVIDER_SITE_OTHER): Payer: PPO | Admitting: Family Medicine

## 2021-08-15 ENCOUNTER — Ambulatory Visit: Payer: PPO | Admitting: Family Medicine

## 2021-08-15 ENCOUNTER — Encounter: Payer: Self-pay | Admitting: Family Medicine

## 2021-08-15 ENCOUNTER — Other Ambulatory Visit: Payer: Self-pay | Admitting: Podiatry

## 2021-08-15 DIAGNOSIS — U071 COVID-19: Secondary | ICD-10-CM | POA: Diagnosis not present

## 2021-08-15 MED ORDER — MELOXICAM 15 MG PO TABS: 15.0000 mg | ORAL_TABLET | Freq: Every day | ORAL | 0 refills | Status: DC

## 2021-08-15 MED ORDER — BENZONATATE 200 MG PO CAPS: 200.0000 mg | ORAL_CAPSULE | Freq: Two times a day (BID) | ORAL | 0 refills | Status: DC | PRN

## 2021-08-15 MED ORDER — MOLNUPIRAVIR EUA 200MG CAPSULE: 4.0000 | ORAL_CAPSULE | Freq: Two times a day (BID) | ORAL | 0 refills | Status: AC

## 2021-08-15 NOTE — Telephone Encounter (Signed)
Notified pt that meloxicam was called in for pt and she said she was already at the pharmacy for something else when she got the text. She is picking it up now. She said she ended up taping all of her toes together and it feels better. But thank you for all the help

## 2021-08-15 NOTE — Telephone Encounter (Signed)
I did not mention pt was concerned because the rx for the gabapentin said to not stop unless your doctor says so as it could cause other issues. She has only been taking it for a week or so. Would she be fine to stop it and start the meloxican.

## 2021-08-15 NOTE — Progress Notes (Signed)
Chief Complaint  Patient presents with   Covid Positive    Tested positive 08/15/21. Congestion Cough Headache Sore throat     Tonya [name] here for URI complaints. Due to COVID-19 pandemic, we are interacting via web portal for an electronic face-to-face visit. I verified patient's ID using 2 identifiers. Patient agreed to proceed with visit via this method. Patient is at home, I am at office. Patient and I are present for visit.   Duration: 1 day  Associated symptoms: Fever (100.8 F), sinus headache, sinus congestion, rhinorrhea, sore throat, myalgia, and coughing Denies: sinus pain, itchy watery eyes, ear pain, ear drainage, wheezing, shortness of breath, and N/V/D, loss of taste/smell Treatment to date: pushing fluids Sick contacts: Yes; spouse Tested + for covid today.   Past Medical History:  Diagnosis Date   Hypertension    Osteopenia    Objective No conversational dyspnea Age appropriate judgment and insight Nml affect and mood  COVID-19 - Plan: molnupiravir EUA (LAGEVRIO) 200 mg CAPS capsule, benzonatate (TESSALON) 200 MG capsule  Continue to push fluids, practice good hand hygiene, cover mouth when coughing. CDC quarantining guidelines discussed.  F/u prn. If starting to experience irreplaceable fluid loss, shaking, or shortness of breath, seek immediate care. Pt voiced understanding and agreement to the plan.  [place], DO 08/15/21 2:23 PM

## 2021-08-18 ENCOUNTER — Other Ambulatory Visit: Payer: Self-pay

## 2021-08-18 ENCOUNTER — Telehealth: Payer: Self-pay

## 2021-08-18 ENCOUNTER — Telehealth: Payer: Self-pay | Admitting: Podiatry

## 2021-08-18 ENCOUNTER — Ambulatory Visit: Payer: PPO | Admitting: Podiatry

## 2021-08-18 DIAGNOSIS — S92502A Displaced unspecified fracture of left lesser toe(s), initial encounter for closed fracture: Secondary | ICD-10-CM | POA: Diagnosis not present

## 2021-08-18 MED ORDER — IBUPROFEN 800 MG PO TABS: 800.0000 mg | ORAL_TABLET | Freq: Three times a day (TID) | ORAL | 0 refills | Status: DC | PRN

## 2021-08-18 NOTE — Telephone Encounter (Signed)
Pt called in states that she was put on antiviral for covid lat week and it is making her nauseous, she only has 2 days left but she would like to know what to do.

## 2021-08-18 NOTE — Progress Notes (Signed)
°  Subjective:  Patient ID: Tonya [name], female    DOB: 1950/06/26,  MRN: [id number]  Chief Complaint  Patient presents with   Neuroma      left foot still having alot of pain esp in 2nd and 3rd    71 y.o. female presents with the above complaint. History confirmed with patient.  Since I last saw her she suffered a toe fracture of the fourth toe on February 8.  She was seen by Dr. Blenda Mounts in our office in [place] and recommended buddy taping and a postop shoe.  She still having quite a bit of pain and swelling and it is keeping her up at night.  The gabapentin meloxicam have helped a little bit but not much  Objective:  Physical Exam: warm, good capillary refill, no trophic changes or ulcerative lesions, normal DP and PT pulses, normal sensory exam, and edema of the digits of the left foot with pain on palpation to the third and fourth toes.   Radiographs: Multiple views x-ray of the left foot: I reviewed her prior left foot radiographs and there is minimally displaced fractures of the proximal and middle phalanges of the fourth toe Assessment:   1. Closed fracture of fourth toe of left foot, initial encounter      Plan:  Patient was evaluated and treated and all questions answered.  Discussed with her I do think her pain is likely still related to the toe fracture and the swelling and inflammation from this.  There is no evidence of recurrence of neuroma or significant neuritis.  I do not think an injection would be helpful at this point.  I recommend she increase her gabapentin dosage to 60 mg at night if tolerated as well as a switch to ibuprofen 8 or milligrams which I sent a prescription for.  Advised to increase her Protonix to 40 mg dose.  I will see her back in 3 weeks for new radiographs.  Return in about 3 weeks (around 09/08/2021) for toe fracture left foot (new x-rays).

## 2021-08-18 NOTE — Telephone Encounter (Signed)
Patient informed of PCP instructions. 

## 2021-08-18 NOTE — Telephone Encounter (Signed)
Sent to pharmacy in today's encounter thank you

## 2021-08-18 NOTE — Telephone Encounter (Signed)
Called left message to call back 

## 2021-08-18 NOTE — Telephone Encounter (Signed)
Notified pt. She said thank you.

## 2021-08-18 NOTE — Telephone Encounter (Signed)
Pt called and her ibuprofen she had at home has expired and she would like you to please call her in a new rx.

## 2021-08-19 NOTE — Progress Notes (Deleted)
?  Charlann Boxer D.O. ?[facility] ?[place] ?Phone: [phone] ?Subjective:   ? ?I'm seeing this patient by the request  of:  Nani Ravens Crosby Oyster, DO ? ?CC:  ? ?[id number]  ?Tonya [name] is a 71 y.o. female coming in with complaint of back and neck pain. OMT 07/04/2021. Patient states  ? ?Medications patient has been prescribed: None ? ?Taking: ? ? ?  ? ? ? ? ?Reviewed prior external information including notes and imaging from previsou exam, outside providers and external EMR if available.  ? ?As well as notes that were available from care everywhere and other healthcare systems. ? ?Past medical history, social, surgical and family history all reviewed in electronic medical record.  No pertanent information unless stated regarding to the chief complaint.  ? ?Past Medical History:  ?Diagnosis Date  ? Hypertension   ? Osteopenia   ?  ?Allergies  ?Allergen Reactions  ? Crestor [Rosuvastatin]   ?  Myalgias ?  ? Lipitor [Atorvastatin]   ?  myalgias  ? ? ? ?Review of Systems: ? No headache, visual changes, nausea, vomiting, diarrhea, constipation, dizziness, abdominal pain, skin rash, fevers, chills, night sweats, weight loss, swollen lymph nodes, body aches, joint swelling, chest pain, shortness of breath, mood changes. POSITIVE muscle aches ? ?Objective  ?There were no vitals taken for this visit. ?  ?General: No apparent distress alert and oriented x3 mood and affect normal, dressed appropriately.  ?HEENT: Pupils equal, extraocular movements intact  ?Respiratory: Patient's speak in full sentences and does not appear short of breath  ?Cardiovascular: No lower extremity edema, non tender, no erythema  ?Neuro: Cranial nerves II through XII are intact, neurovascularly intact in all extremities with 2+ DTRs and 2+ pulses.  ?Gait normal with good balance and coordination.  ?MSK:  Non tender with full range of motion and good stability and symmetric strength and tone of  shoulders, elbows, wrist, hip, knee and ankles bilaterally.  ?Back - Normal skin, Spine with normal alignment and no deformity.  No tenderness to vertebral process palpation.  Paraspinous muscles are not tender and without spasm.   Range of motion is full at neck and lumbar sacral regions ? ?Osteopathic findings ? ?C2 flexed rotated and side bent right ?C6 flexed rotated and side bent left ?T3 extended rotated and side bent right inhaled rib ?T9 extended rotated and side bent left ?L2 flexed rotated and side bent right ?Sacrum right on right ? ? ? ? ?  ?Assessment and Plan: ? ? ? ?Nonallopathic problems ? ?Decision today to treat with OMT was based on Physical Exam ? ?After verbal consent patient was treated with HVLA, ME, FPR techniques in cervical, rib, thoracic, lumbar, and sacral  areas ? ?Patient tolerated the procedure well with improvement in symptoms ? ?Patient given exercises, stretches and lifestyle modifications ? ?See medications in patient instructions if given ? ?Patient will follow up in 4-8 weeks ? ?  ? ? ?The above documentation has been reviewed and is accurate and complete Jacqualin Combes ? ? ?  ? ? Note: This dictation was prepared with Dragon dictation along with smaller phrase technology. Any transcriptional errors that result from this process are unintentional.    ?  ?  ? ?

## 2021-08-20 ENCOUNTER — Ambulatory Visit: Payer: PPO | Admitting: Family Medicine

## 2021-08-26 NOTE — Progress Notes (Signed)
?Tonya [name] D.O. ?[facility] ?[place] ?Phone: [phone] ?Subjective:   ?I, [name], am serving as a Education administrator for Dr. Hulan Saas. ?This visit occurred during the SARS-CoV-2 public health emergency.  Safety protocols were in place, including screening questions prior to the visit, additional usage of staff PPE, and extensive cleaning of exam room while observing appropriate contact time as indicated for disinfecting solutions.  ? ?I'm seeing this patient by the request  of:  Nani Ravens Crosby Oyster, DO ? ?CC: Low back pain follow-up ? ?[id number]  ?Tonya [name] is a 71 y.o. female coming in with complaint of back and neck pain. OMT on 07/04/2021. Patient states same per usual. Mid back and neck the worse today. Broke her toe and in boot for a few weeks. No other complaints.  Patient states that she is having more abdominal pain as well.  Was taking ibuprofen for her foot pain.  Patient had to increase her Prilosec.  Now having more of a left-sided pain as well that seems to radiate to her back.  Denies any fevers chills or any abnormal weight loss. ? ?Medications patient has been prescribed: None ? ?Taking: ? ? ?  ? ? ? ? ?Reviewed prior external information including notes and imaging from previsou exam, outside providers and external EMR if available.  ? ?As well as notes that were available from care everywhere and other healthcare systems. ? ?Past medical history, social, surgical and family history all reviewed in electronic medical record.  No pertanent information unless stated regarding to the chief complaint.  ? ?Past Medical History:  ?Diagnosis Date  ? Hypertension   ? Osteopenia   ?  ?Allergies  ?Allergen Reactions  ? Crestor [Rosuvastatin]   ?  Myalgias ?  ? Lipitor [Atorvastatin]   ?  myalgias  ? ? ? ?Review of Systems: ? No headache, visual changes, nausea, vomiting, diarrhea, constipation, dizziness, a, skin rash, fevers, chills, night sweats,  weight loss, swollen lymph nodes, joint swelling, chest pain, shortness of breath, mood changes. POSITIVE muscle aches, body aches, abdominal pain ? ?Objective  ?Blood pressure 114/70, height '5\' 7"'$  (1.702 m), weight 220 lb (99.8 kg). ?  ?General: No apparent distress alert and oriented x3 mood and affect normal, dressed appropriately.  ?HEENT: Pupils equal, extraocular movements intact  ?Respiratory: Patient's speak in full sentences and does not appear short of breath  ?Cardiovascular: No lower extremity edema, non tender, no erythema  ?Low back exam does have some loss of lordosis.  Abdominal exam does show mild the potential enlargement noted of the spleen palpated on the inferior margin of the costal margin.  No masses though appreciated. ? ?Osteopathic findings ? ?C2 flexed rotated and side bent right ?C6 flexed rotated and side bent right ?T3 extended rotated and side bent right inhaled rib ?L1 flexed rotated and side bent left ?Sacrum right on right ? ? ? ? ?  ?Assessment and Plan: ? ?Low back pain ?Chronic, with exacerbation.  Seems to be secondary to patient compensating for the toe.  Patient continues to have discomfort of the back and more on the left side now.  Does have some abdominal pain associated with it as well.  Discussed with patient about home exercises in which activities to avoid.  Increase activity slowly.  Would like to see patient again in approximately 6 weeks to see how patient is responding. ? ?Chronic left-sided thoracic back pain ?Has had this pain previously but now  little more abdominal pain.  Questionable enlargement of the spleen felt noted today as well.  Discussed with patient icing regimen and home exercises.  We will get abdominal ultrasound to further evaluate due to patient's concern.  I am more concerned that patient did take ibuprofen and could be having a very small ulcer.  Patient denies though any type of change in bowel and states that her reflux seem to be back to her  baseline.  ? ?Nonallopathic problems ? ?Decision today to treat with OMT was based on Physical Exam ? ?After verbal consent patient was treated with HVLA, ME, FPR techniques in cervical, rib, thoracic, lumbar, and sacral  areas ? ?Patient tolerated the procedure well with improvement in symptoms ? ?Patient given exercises, stretches and lifestyle modifications ? ?See medications in patient instructions if given ? ?Patient will follow up in 4-8 weeks ? ?  ? ? ?The above documentation has been reviewed and is accurate and complete Lyndal Pulley, DO ? ? ?  ? ? Note: This dictation was prepared with Dragon dictation along with smaller phrase technology. Any transcriptional errors that result from this process are unintentional.    ?  ?  ? ?

## 2021-08-27 ENCOUNTER — Other Ambulatory Visit: Payer: Self-pay

## 2021-08-27 ENCOUNTER — Ambulatory Visit: Payer: PPO | Admitting: Family Medicine

## 2021-08-27 VITALS — BP 114/70 | Ht 67.0 in | Wt 220.0 lb

## 2021-08-27 DIAGNOSIS — G8929 Other chronic pain: Secondary | ICD-10-CM | POA: Diagnosis not present

## 2021-08-27 DIAGNOSIS — R109 Unspecified abdominal pain: Secondary | ICD-10-CM

## 2021-08-27 DIAGNOSIS — M9904 Segmental and somatic dysfunction of sacral region: Secondary | ICD-10-CM

## 2021-08-27 DIAGNOSIS — M546 Pain in thoracic spine: Secondary | ICD-10-CM | POA: Diagnosis not present

## 2021-08-27 DIAGNOSIS — M9901 Segmental and somatic dysfunction of cervical region: Secondary | ICD-10-CM | POA: Diagnosis not present

## 2021-08-27 DIAGNOSIS — M9903 Segmental and somatic dysfunction of lumbar region: Secondary | ICD-10-CM | POA: Diagnosis not present

## 2021-08-27 DIAGNOSIS — M545 Low back pain, unspecified: Secondary | ICD-10-CM

## 2021-08-27 DIAGNOSIS — M9902 Segmental and somatic dysfunction of thoracic region: Secondary | ICD-10-CM

## 2021-08-27 DIAGNOSIS — M9908 Segmental and somatic dysfunction of rib cage: Secondary | ICD-10-CM | POA: Diagnosis not present

## 2021-08-27 NOTE — Assessment & Plan Note (Signed)
Chronic, with exacerbation.  Seems to be secondary to patient compensating for the toe.  Patient continues to have discomfort of the back and more on the left side now.  Does have some abdominal pain associated with it as well.  Discussed with patient about home exercises in which activities to avoid.  Increase activity slowly.  Would like to see patient again in approximately 6 weeks to see how patient is responding. ?

## 2021-08-27 NOTE — Patient Instructions (Addendum)
Carbon fiber plate for foot ?Start getting back into routine ?See you again in 6 weeks ?

## 2021-08-27 NOTE — Assessment & Plan Note (Signed)
Has had this pain previously but now little more abdominal pain.  Questionable enlargement of the spleen felt noted today as well.  Discussed with patient icing regimen and home exercises.  We will get abdominal ultrasound to further evaluate due to patient's concern.  I am more concerned that patient did take ibuprofen and could be having a very small ulcer.  Patient denies though any type of change in bowel and states that her reflux seem to be back to her baseline. ?

## 2021-08-29 ENCOUNTER — Ambulatory Visit: Payer: PPO | Admitting: Podiatry

## 2021-09-04 ENCOUNTER — Ambulatory Visit
Admission: RE | Admit: 2021-09-04 | Discharge: 2021-09-04 | Disposition: A | Discharge status: Home / Self Care | Payer: PPO | Source: Ambulatory Visit | Attending: Family Medicine | Admitting: Family Medicine

## 2021-09-04 DIAGNOSIS — K7689 Other specified diseases of liver: Secondary | ICD-10-CM | POA: Diagnosis not present

## 2021-09-04 DIAGNOSIS — Z853 Personal history of malignant neoplasm of breast: Secondary | ICD-10-CM | POA: Diagnosis not present

## 2021-09-04 IMAGING — US US ABDOMEN COMPLETE
1 series · 13 of 25 positions shown · non-contrast
Comparison: Cardiac CTA dated [DATE] and thoracic spine MR
dated [DATE].

CLINICAL DATA: Left upper quadrant abdominal pain for the past
year. History of breast cancer.

EXAM:
ABDOMEN ULTRASOUND COMPLETE

[Series 1: us abdomen complete · 0.23mm/px · 13 of 94 slices shown]
[im 1/94]
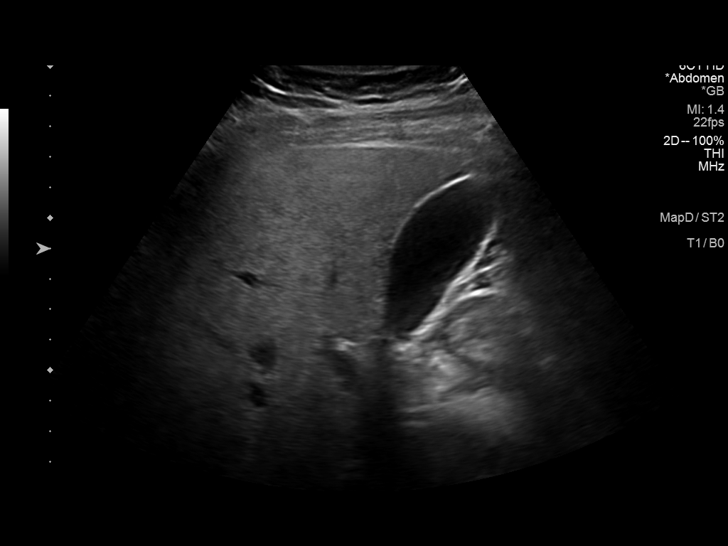
[im 8/94]
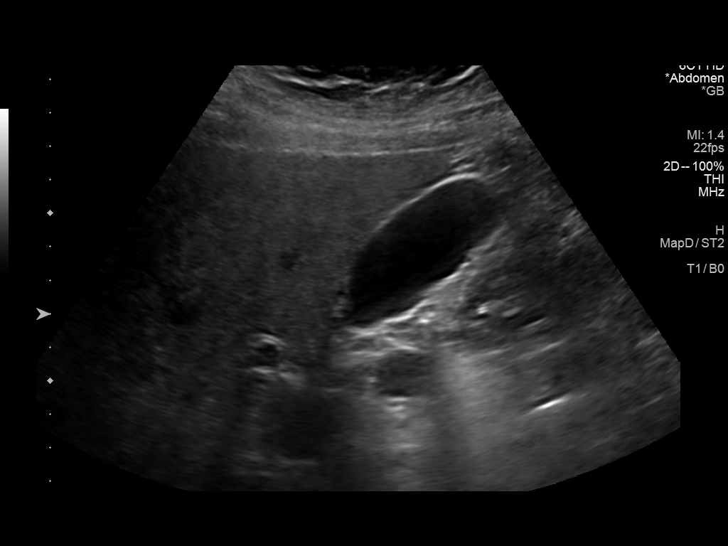
[im 16/94]
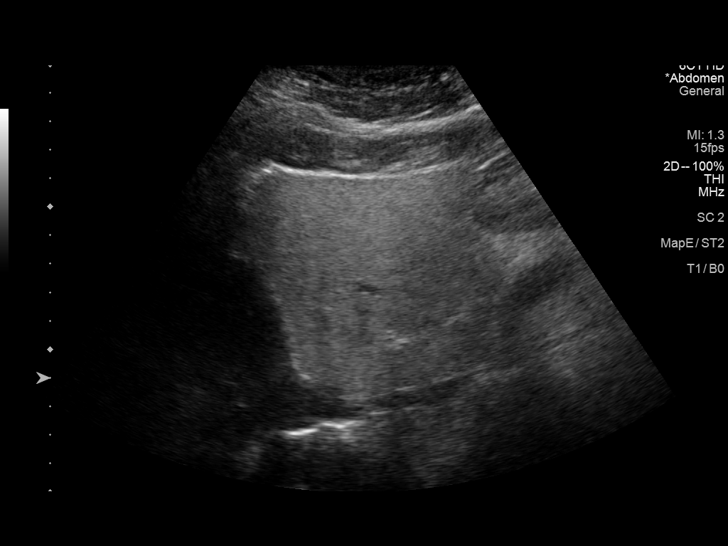
[im 24/94]
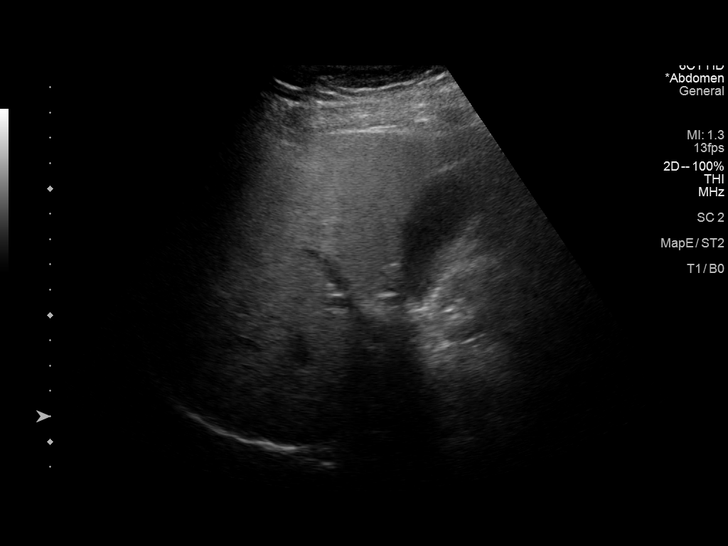
[im 32/94]
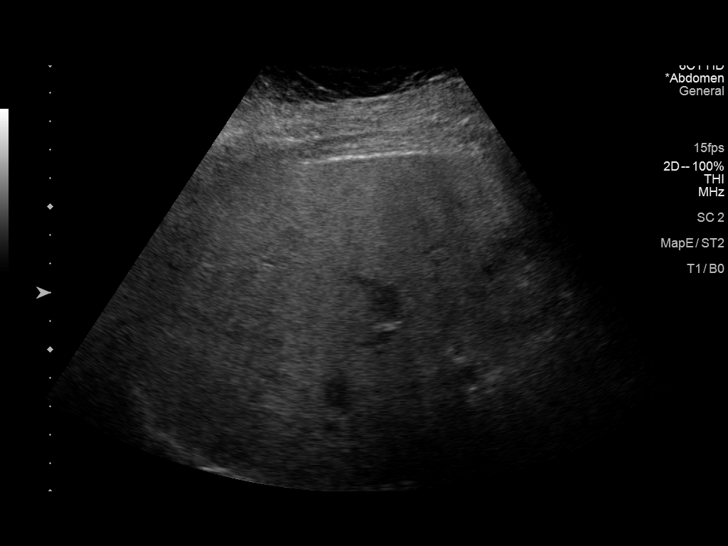
[im 39/94]
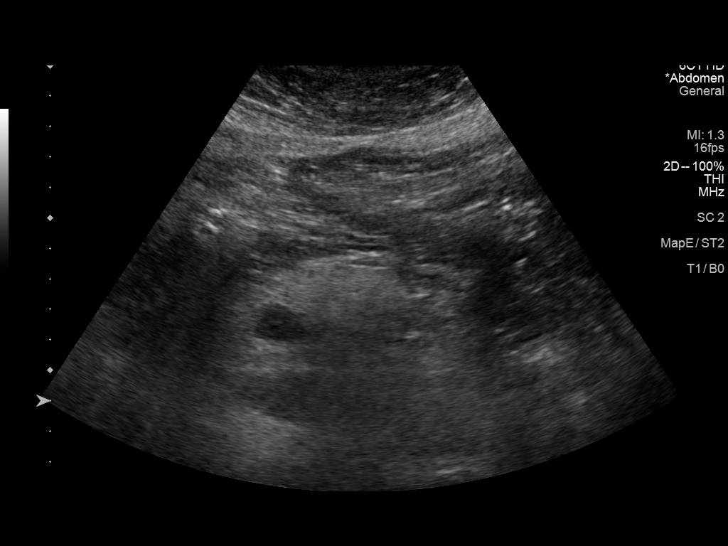
[im 47/94]
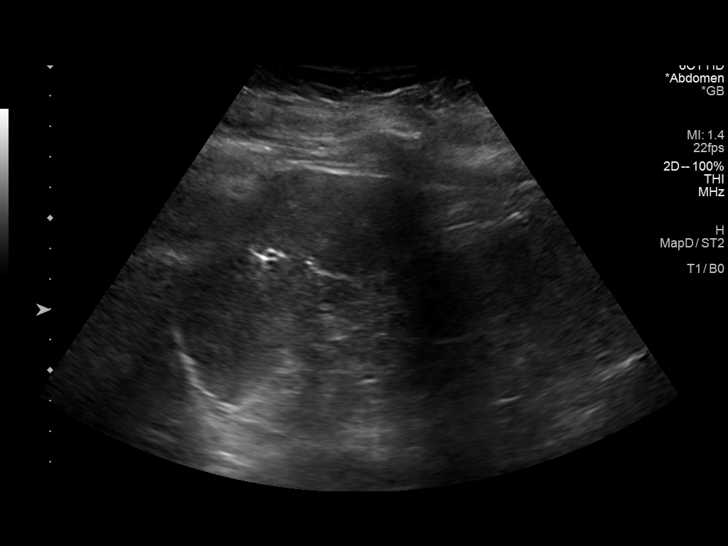
[im 55/94]
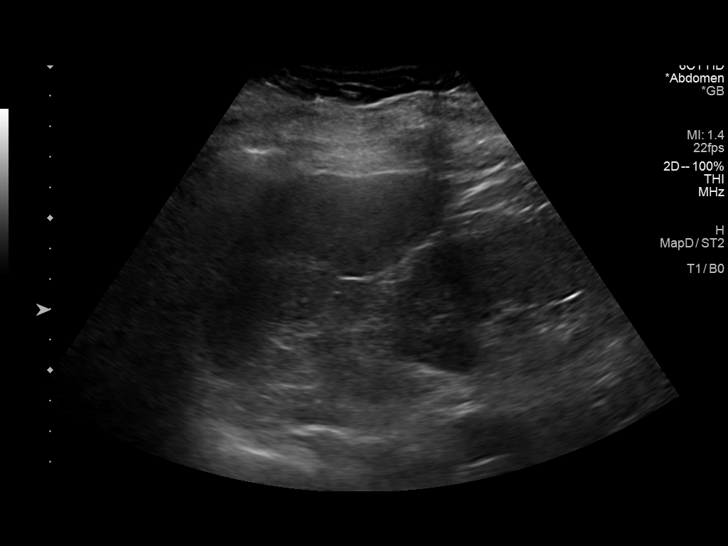
[im 63/94]
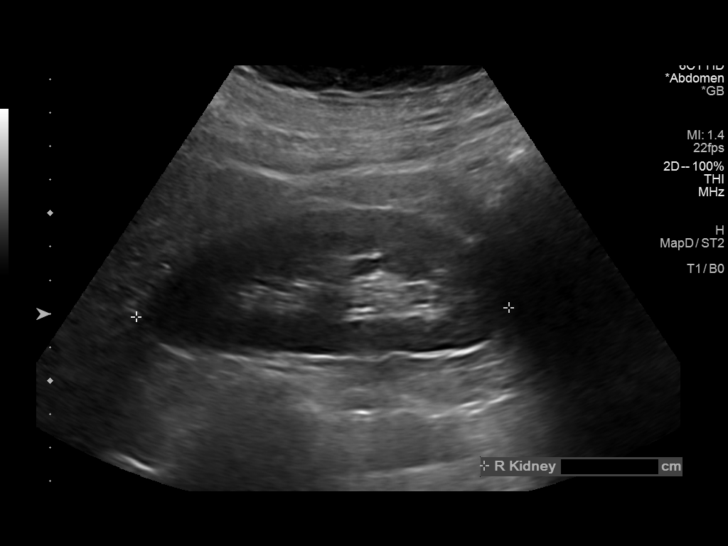
[im 70/94]
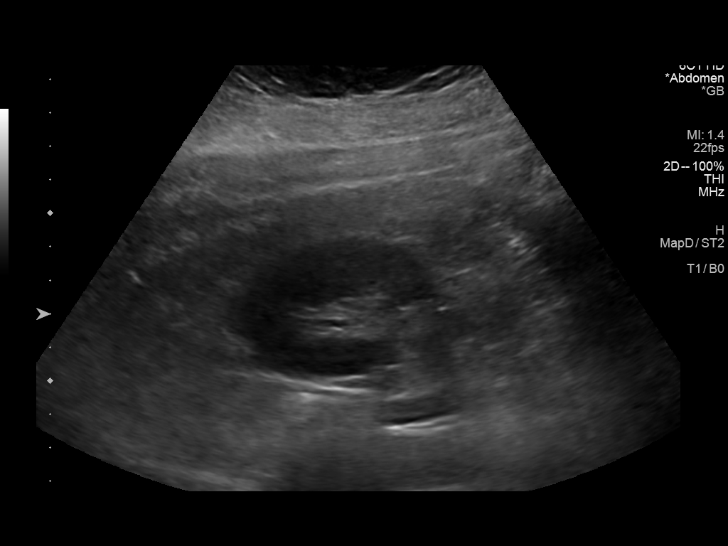
[im 78/94]
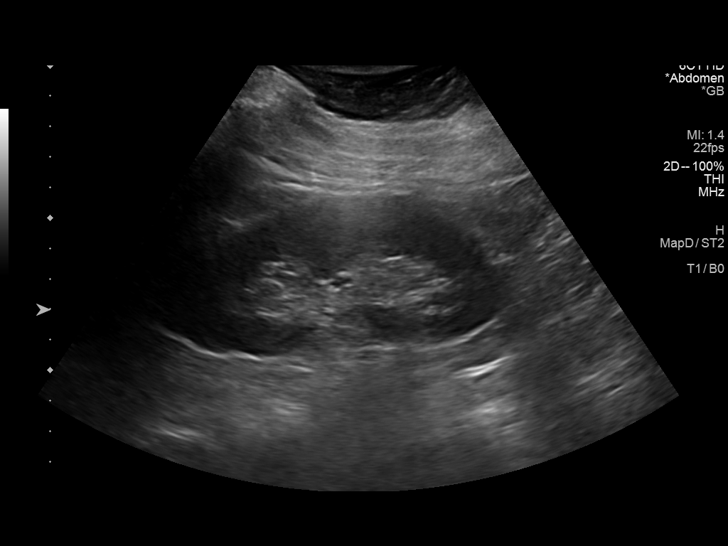
[im 86/94]
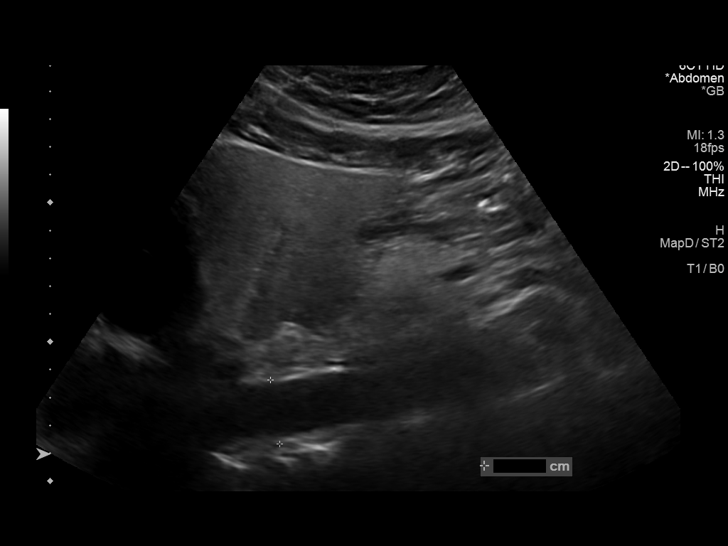
[im 94/94]
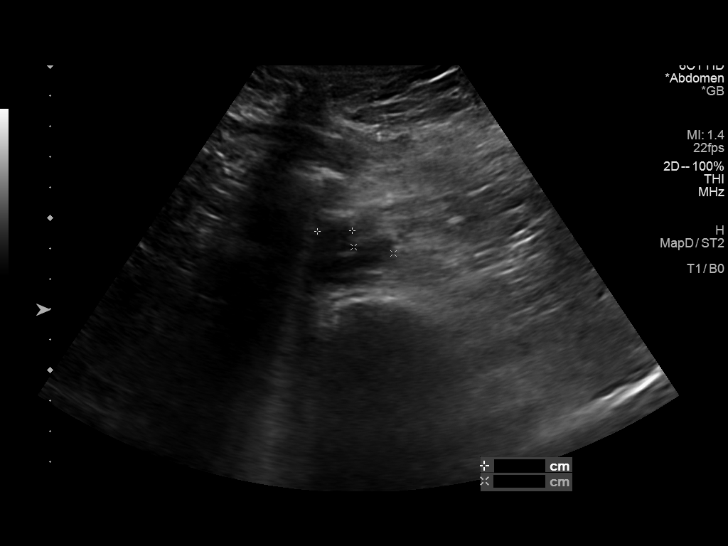

[13 of 25 positions shown; findings below may reference images not displayed]

FINDINGS: Gallbladder: No gallstones or wall thickening visualized. No
sonographic Murphy sign noted by sonographer.

Common bile duct: Diameter: 5.2 mm

Liver: Diffusely echogenic and mildly heterogeneous. Corresponding
diffuse low density on the previous CTA. Portal vein is patent on
color Doppler imaging with normal direction of blood flow towards
the liver.

IVC: No abnormality visualized.

Pancreas: Visualized portion unremarkable.

Spleen: The hilar portion of the spleen has a somewhat rounded shape
with no mass visible at that location on the previous CTA or MRI,
not included in its entirety on those images. Otherwise, normal in
size and shape.

Right Kidney: Length: 11.1 cm. Echogenicity within normal limits. No
mass or hydronephrosis visualized.

Left Kidney: Length: 11.1 cm. Echogenicity within normal limits. No
mass or hydronephrosis visualized.

Abdominal aorta: No aneurysm visualized.

Other findings: None.
IMPRESSION: 1. No acute abnormality.
2. The hilar portion of the spleen has a somewhat rounded shape,
making it difficult to exclude a mass. The majority of this portion
of the spleen was not included on the previous cardiac CTA or
thoracic spine MRI. This could be better defined with an abdomen CT
or MRI with contrast.

## 2021-09-08 ENCOUNTER — Telehealth: Payer: Self-pay | Admitting: Family Medicine

## 2021-09-08 ENCOUNTER — Encounter: Payer: Self-pay | Admitting: Family Medicine

## 2021-09-08 NOTE — Telephone Encounter (Signed)
Patient would like to go ahead and have the MRI of the abdomen and pelvis with contrast done per the [place] results and note from Dr Tamala Julian. ? ? ?Can this order be put in please? ? ?

## 2021-09-09 ENCOUNTER — Other Ambulatory Visit: Payer: Self-pay

## 2021-09-09 ENCOUNTER — Ambulatory Visit: Payer: PPO | Admitting: Podiatry

## 2021-09-09 ENCOUNTER — Ambulatory Visit: Payer: PPO

## 2021-09-09 ENCOUNTER — Ambulatory Visit (INDEPENDENT_AMBULATORY_CARE_PROVIDER_SITE_OTHER): Payer: PPO

## 2021-09-09 ENCOUNTER — Telehealth: Payer: Self-pay | Admitting: Family Medicine

## 2021-09-09 DIAGNOSIS — S92502D Displaced unspecified fracture of left lesser toe(s), subsequent encounter for fracture with routine healing: Secondary | ICD-10-CM

## 2021-09-09 DIAGNOSIS — R935 Abnormal findings on diagnostic imaging of other abdominal regions, including retroperitoneum: Secondary | ICD-10-CM

## 2021-09-09 DIAGNOSIS — S92502A Displaced unspecified fracture of left lesser toe(s), initial encounter for closed fracture: Secondary | ICD-10-CM | POA: Diagnosis not present

## 2021-09-09 NOTE — Telephone Encounter (Signed)
Pt called [facility] and was unable to schedule the MRI's as they said something is wrong with the way it was ordered? No info given ? ?Pt also questioning why we chose Pelvis, would like clarity on that before she schedules. ?

## 2021-09-09 NOTE — Telephone Encounter (Signed)
Order placed and patient notified 

## 2021-09-10 NOTE — Telephone Encounter (Signed)
Responded to pt via MyChart msgs. ?

## 2021-09-12 ENCOUNTER — Encounter: Payer: Self-pay | Admitting: Podiatry

## 2021-09-12 NOTE — Progress Notes (Signed)
?  Subjective:  ?Patient ID: Tonya [name], female    DOB: 1951-02-03,  MRN: [id number] ? ?Chief Complaint  ?Patient presents with  ? Fracture  ?    toe fracture left foot   ? ? ?71 y.o. female presents with the above complaint. History confirmed with patient.  Overall starting to feel better still having random pain ? ?Objective:  ?Physical Exam: ?warm, good capillary refill, no trophic changes or ulcerative lesions, normal DP and PT pulses, normal sensory exam, and mild tendreness of the digits of the left foot with pain on palpation to the third and fourth toes. ? ? ?Radiographs: ?Multiple views x-ray of the left foot: new films taken today show stable alignement with  bone callus formaation ?Assessment:  ? ?1. Closed fracture of fourth toe of left foot, initial encounter   ?2. Closed fracture of phalanx of left fourth toe with routine healing, subsequent encounter   ? ? ? ?Plan:  ?Patient was evaluated and treated and all questions answered. ? ?Reviewed new xrays with her today, showing improvement. Does not need buddy taping any longer and can return to regular suppportive shoes. Will see  backas needed ? ?No follow-ups on file.  ? ?

## 2021-09-19 ENCOUNTER — Encounter: Payer: Self-pay | Admitting: Podiatry

## 2021-09-23 ENCOUNTER — Encounter: Payer: Self-pay | Admitting: Family Medicine

## 2021-09-23 ENCOUNTER — Other Ambulatory Visit: Payer: PPO

## 2021-09-23 ENCOUNTER — Ambulatory Visit
Admission: RE | Admit: 2021-09-23 | Discharge: 2021-09-23 | Disposition: A | Discharge status: Home / Self Care | Payer: PPO | Source: Ambulatory Visit | Attending: Family Medicine | Admitting: Family Medicine

## 2021-09-23 DIAGNOSIS — D3500 Benign neoplasm of unspecified adrenal gland: Secondary | ICD-10-CM | POA: Diagnosis not present

## 2021-09-23 DIAGNOSIS — R935 Abnormal findings on diagnostic imaging of other abdominal regions, including retroperitoneum: Secondary | ICD-10-CM

## 2021-09-23 DIAGNOSIS — K760 Fatty (change of) liver, not elsewhere classified: Secondary | ICD-10-CM | POA: Diagnosis not present

## 2021-09-23 IMAGING — MR MR PELVIS WO/W CM
6 of 9 series · 30 of 48 positions shown · IV contrast (multihance)
Comparison: Abdomen ultrasound on [DATE]

CLINICAL DATA: Left upper quadrant pain. Possible splenic mass on
recent ultrasound. Personal history of breast carcinoma.

EXAM:
MRI ABDOMEN AND PELVIS WITHOUT AND WITH CONTRAST
TECHNIQUE: Multiplanar multisequence MR imaging of the abdomen and pelvis was
performed both before and after the administration of intravenous
contrast.
CONTRAST:  19mL MULTIHANCE GADOBENATE DIMEGLUMINE 529 MG/ML IV SOLN

[Series 3: cor haste · coronal · 4.5mm · 0.78mm/px · 2 of 4 slices shown (1 of 2)]
[im 1/4]
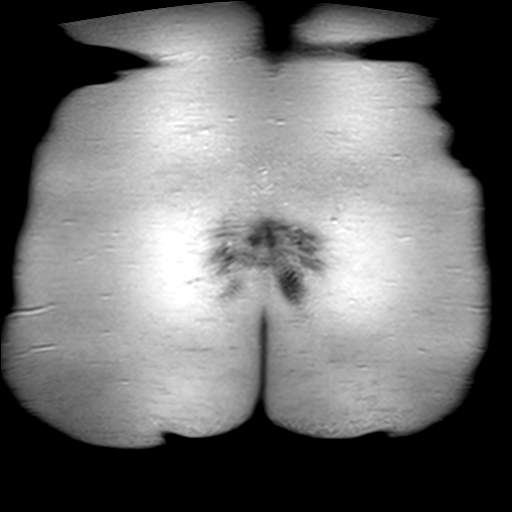
[im 4/4]
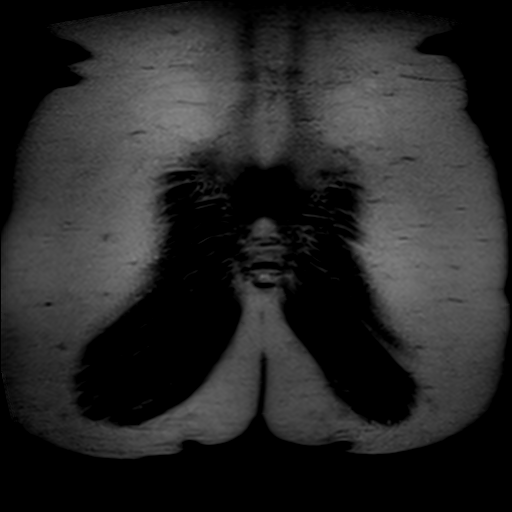

[Series 4: cor haste · coronal · 4.5mm · 0.78mm/px · 8 of 42 slices shown (2 of 2)]
[im 1/42]
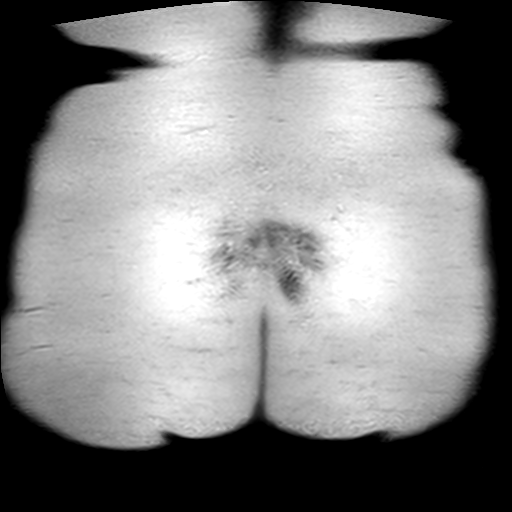
[im 6/42]
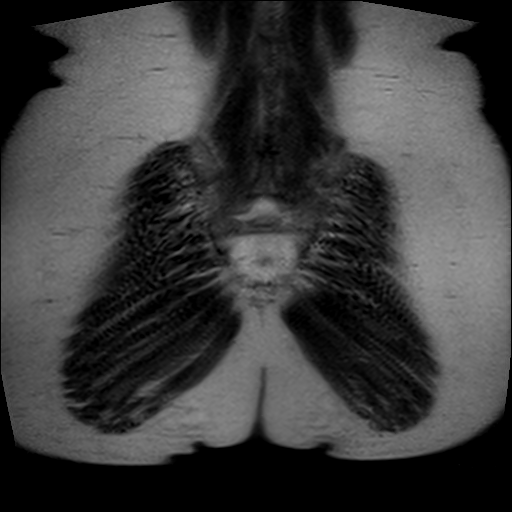
[im 12/42]
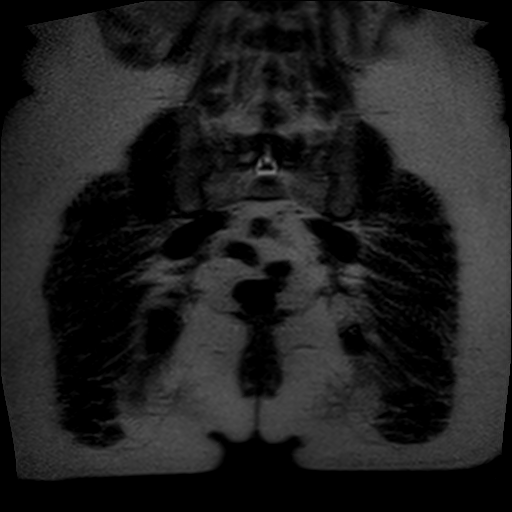
[im 18/42]
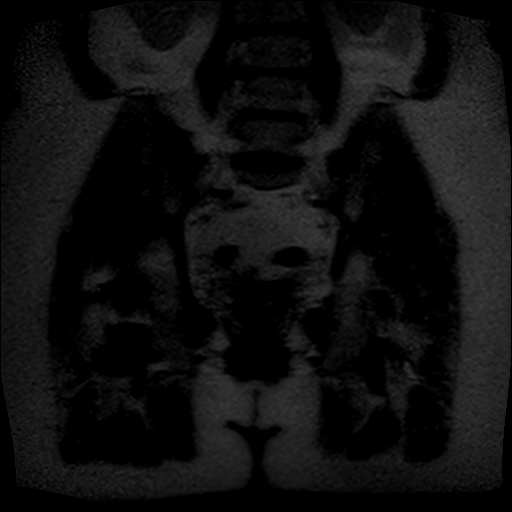
[im 24/42]
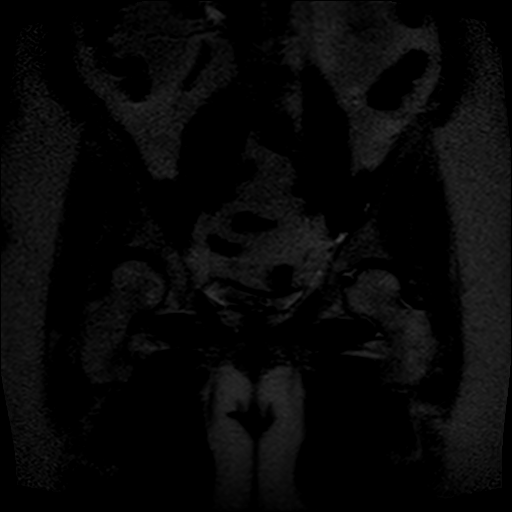
[im 30/42]
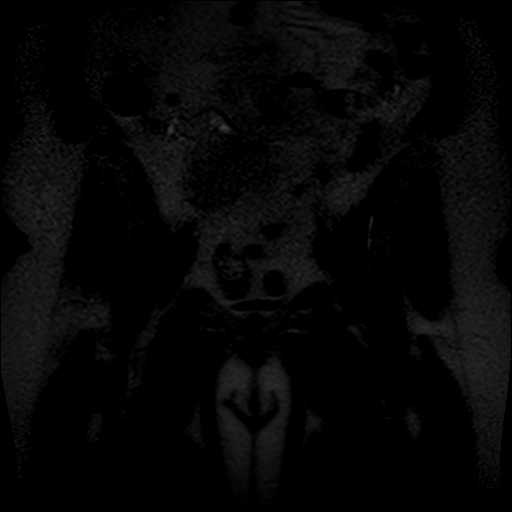
[im 36/42]
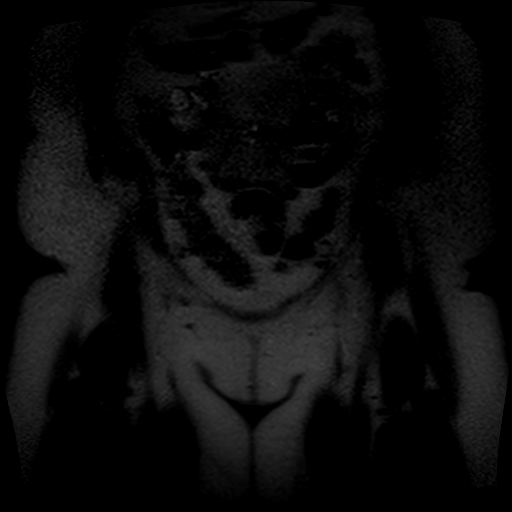
[im 42/42]
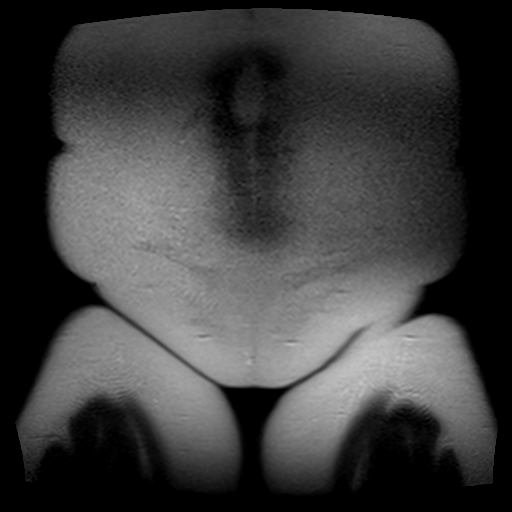

[Series 5: t2_tse_sag · sagittal · 5.0mm · 0.98mm/px · 6 of 32 slices shown]
[im 1/32]
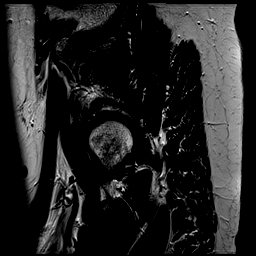
[im 7/32]
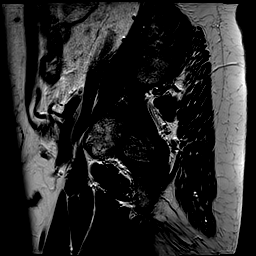
[im 13/32]
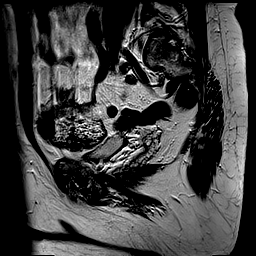
[im 19/32]
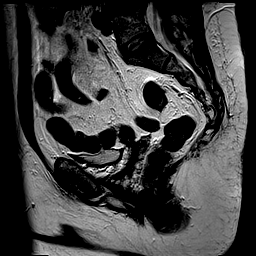
[im 25/32]
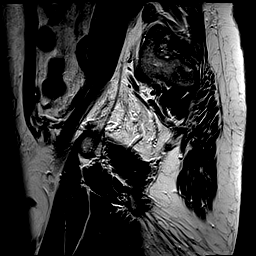
[im 32/32]
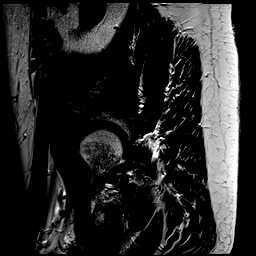

[Series 6: t2_tse axial · axial · 6.0mm · 1.17mm/px · z∈[-279,-53]mm · 5 of 30 slices shown]
[im 1/30]
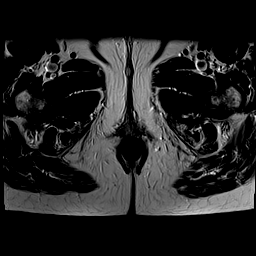
[im 8/30]
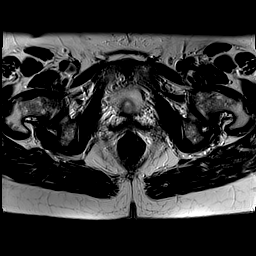
[im 15/30]
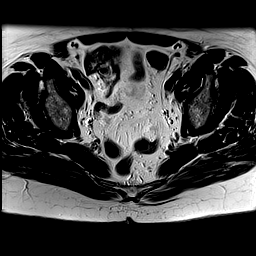
[im 22/30]
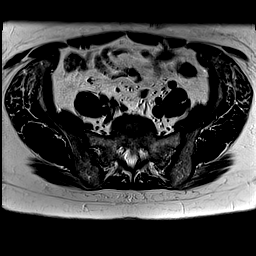
[im 30/30]
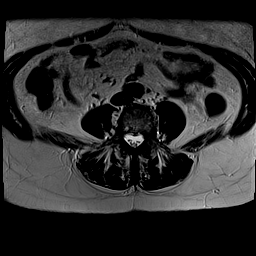

[Series 7: axial spgr · axial · 6.0mm · 1.17mm/px · z∈[-279,-53]mm · 5 of 30 slices shown]
[im 1/30]
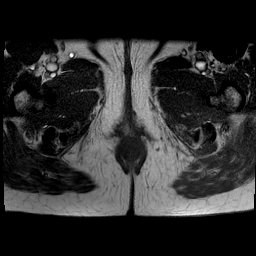
[im 8/30]
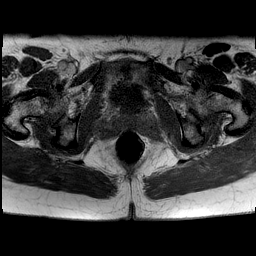
[im 15/30]
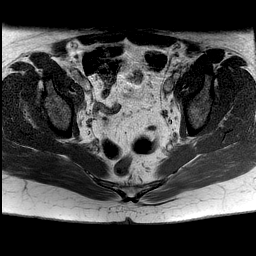
[im 22/30]
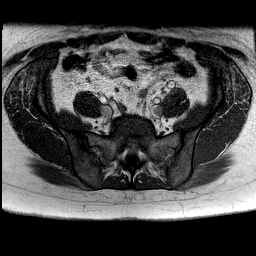
[im 30/30]
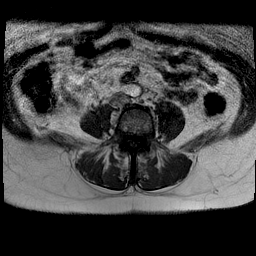

[Series 8: axial spgr pre · axial · non-contrast · 6.0mm · 0.59mm/px · z∈[-279,-115]mm · 4 of 30 slices shown]
[im 1/30]
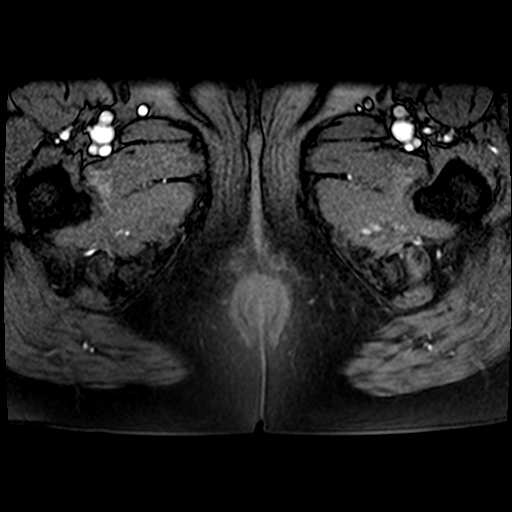
[im 8/30]
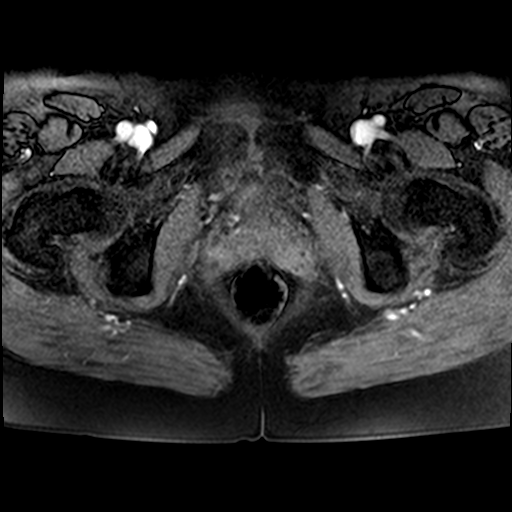
[im 15/30]
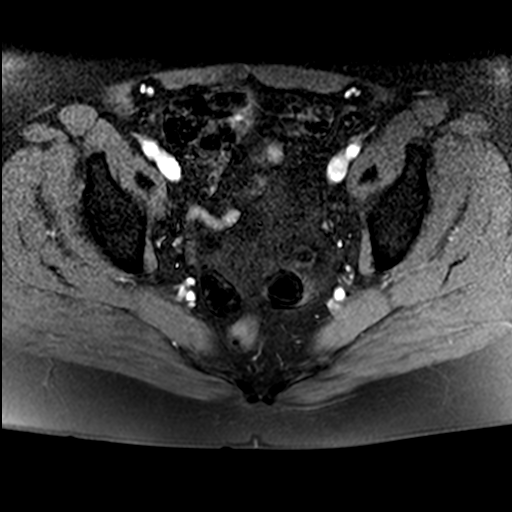
[im 22/30]
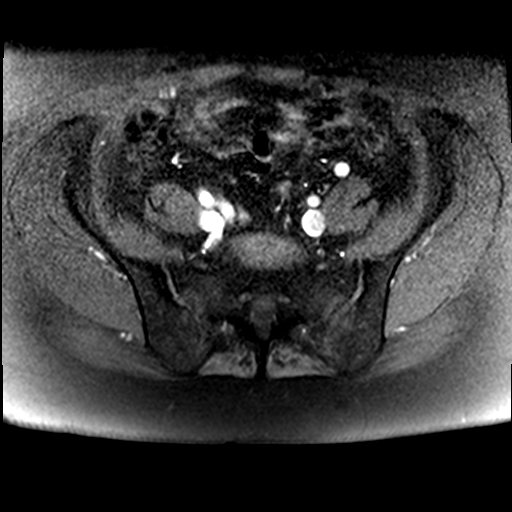

[30 of 48 positions shown; findings below may reference images not displayed]

FINDINGS: COMBINED FINDINGS FOR BOTH MR ABDOMEN AND PELVIS

Lower Chest: No acute findings.

Hepatobiliary: No hepatic masses identified. Moderate diffuse
hepatic steatosis is seen on chemical shift imaging. Gallbladder is
unremarkable. No evidence of biliary ductal dilatation.

Pancreas:  No mass or inflammatory changes.

Spleen: Normal appearance. No evidence of splenic mass or
splenomegaly.

Adrenals/Urinary Tract: A 1.5 cm left adrenal mass is seen which
shows signal dropout on chemical shift imaging, consistent with a
benign adrenal adenoma. (No followup imaging is recommended.) Both
kidneys are normal in appearance. No evidence of hydronephrosis.

Stomach/Bowel: No evidence of obstruction, inflammatory process or
abnormal fluid collections.

Vascular/Lymphatic: No pathologically enlarged lymph nodes. No acute
vascular findings.

Reproductive: Prior hysterectomy noted. Adnexal regions are
unremarkable in appearance.

Other:  None.

Musculoskeletal:  No suspicious bone lesions identified.
IMPRESSION: No acute findings.  No evidence of splenic mass.

1.5 cm benign left adrenal adenoma.

Moderate diffuse hepatic steatosis.

## 2021-09-23 IMAGING — MR MR ABDOMEN WO/W CM
12 of 17 series · 26 of 48 positions shown · IV contrast (19 ML MULTIHANCE)
Comparison: Abdomen ultrasound on [DATE]

CLINICAL DATA: Left upper quadrant pain. Possible splenic mass on
recent ultrasound. Personal history of breast carcinoma.

EXAM:
MRI ABDOMEN AND PELVIS WITHOUT AND WITH CONTRAST
TECHNIQUE: Multiplanar multisequence MR imaging of the abdomen and pelvis was
performed both before and after the administration of intravenous
contrast.
CONTRAST:  19mL MULTIHANCE GADOBENATE DIMEGLUMINE 529 MG/ML IV SOLN

[Series 3: cor haste · coronal · 5.0mm · 0.68mm/px · 1 of 35 slices shown]
[im 1/35]
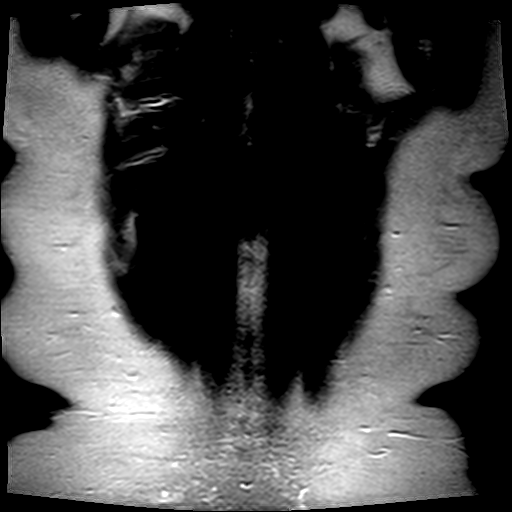

[Series 4: axial haste · axial · 6.0mm · 0.68mm/px · 1 of 40 slices shown]
[im 1/40]
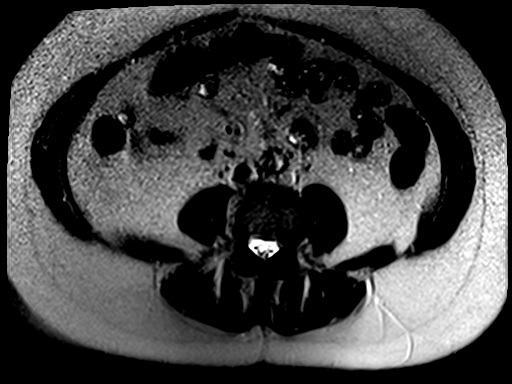

[Series 5: T1 · axial · 6.0mm · 0.68mm/px · z∈[-54,+203]mm · 2 of 80 slices shown]
[im 1/80]
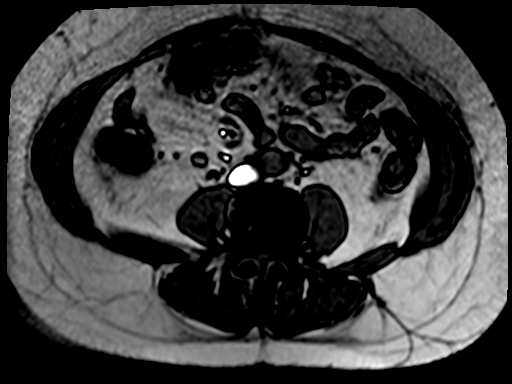
[im 80/80]
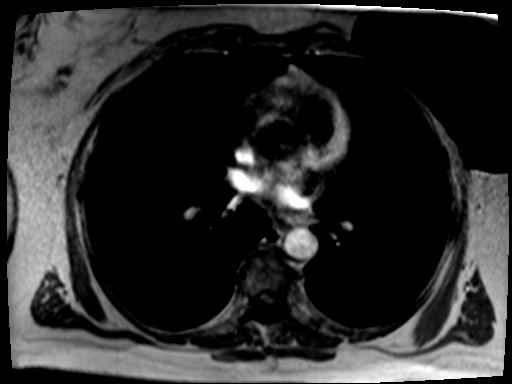

[Series 6: bSSFP · axial · 6.0mm · 0.68mm/px · 1 of 40 slices shown]
[im 1/40]
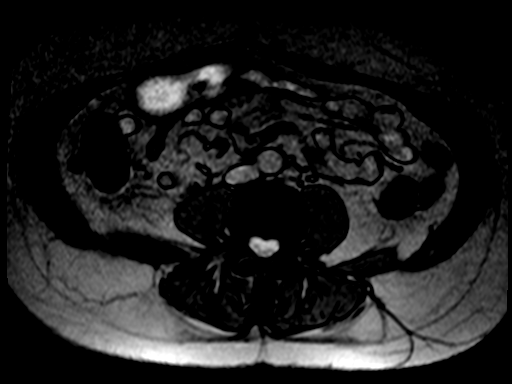

[Series 7: T2 fat-sat · axial · 6.0mm · 1.09mm/px · 1 of 40 slices shown]
[im 1/40]
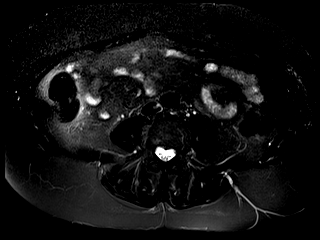

[Series 8: ep2d_diff_b50_500_800_p2_trig · axial · 6.0mm · 1.82mm/px · z∈[-52,+200]mm · 3 of 108 slices shown]
[im 1/108]
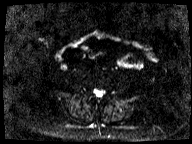
[im 54/108]
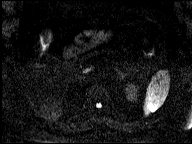
[im 108/108]
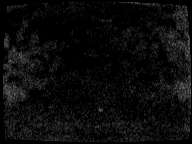

[Series 9: ep2d_diff_b50_500_800_p2_trig_adc · axial · 6.0mm · 1.82mm/px · 1 of 36 slices shown]
[im 1/36]
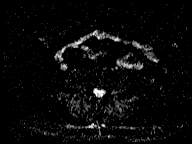

[Series 10: T1 dynamic · axial · non-contrast · 2.5mm · 0.74mm/px · z∈[-55,+202]mm · 3 of 104 slices shown]
[im 1/104]
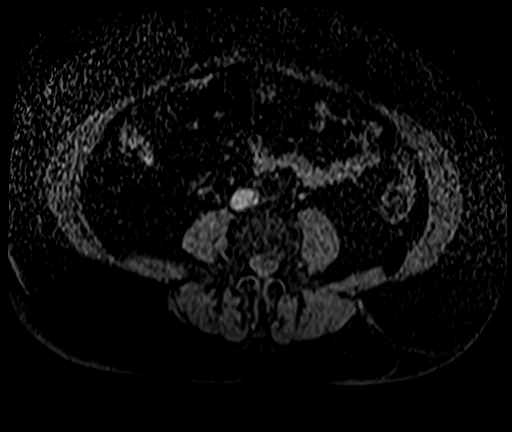
[im 52/104]
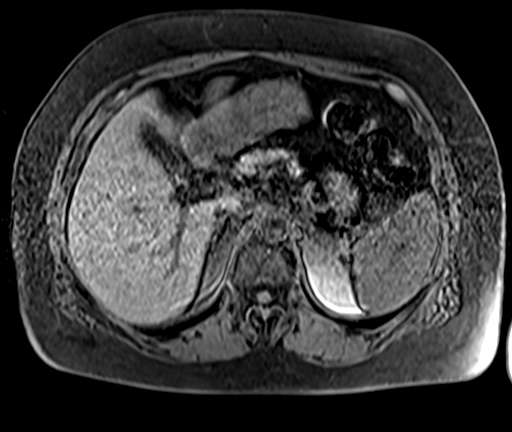
[im 104/104]
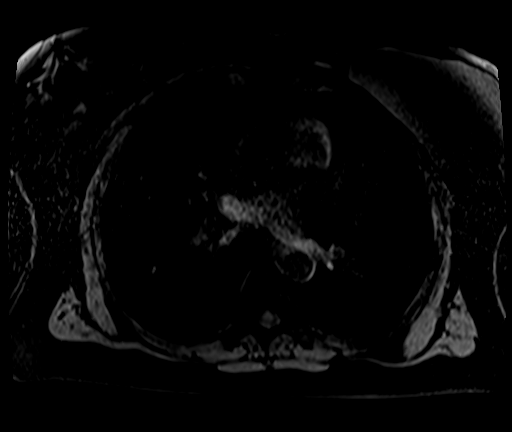

[Series 11: T1 dynamic post-contrast · axial · 2.5mm · 0.74mm/px · z∈[-55,+202]mm · 4 of 104 slices shown (1 of 4)]
[im 1/104]
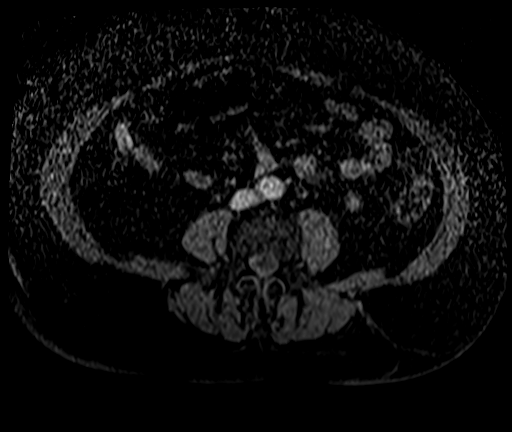
[im 35/104]
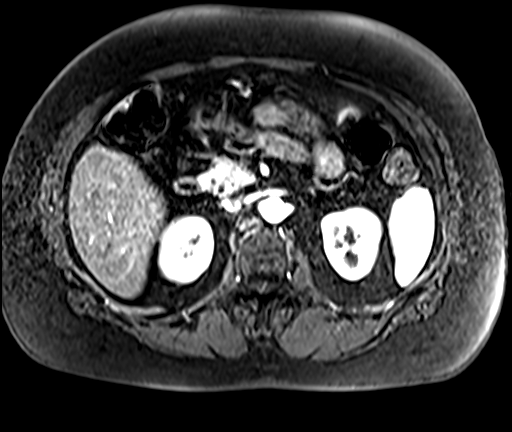
[im 69/104]
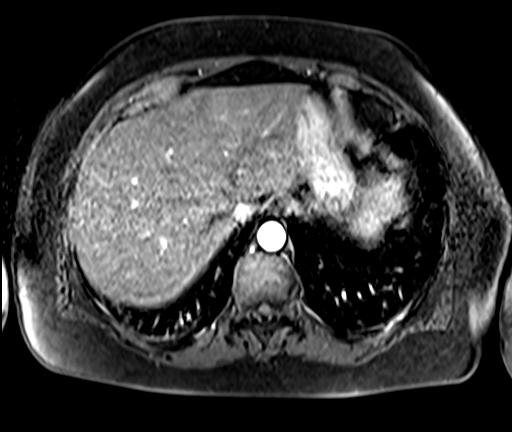
[im 104/104]
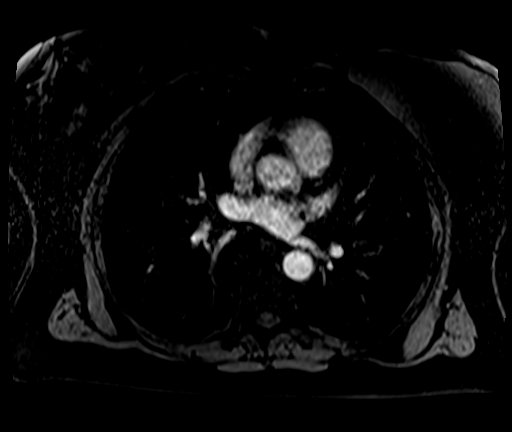

[Series 12: T1 dynamic post-contrast · axial · 2.5mm · 0.74mm/px · z∈[-55,+202]mm · 4 of 104 slices shown (2 of 4)]
[im 1/104]
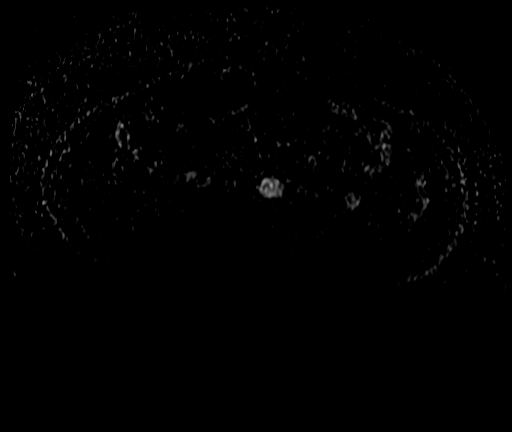
[im 35/104]
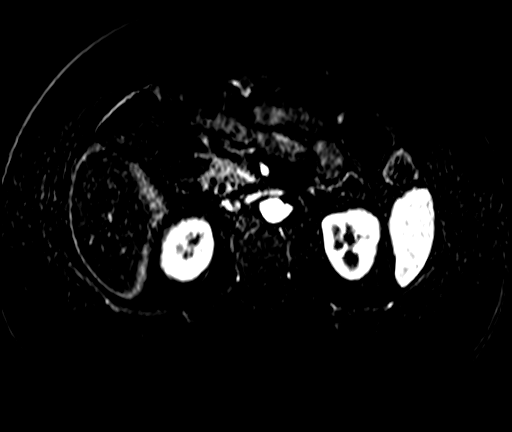
[im 69/104]
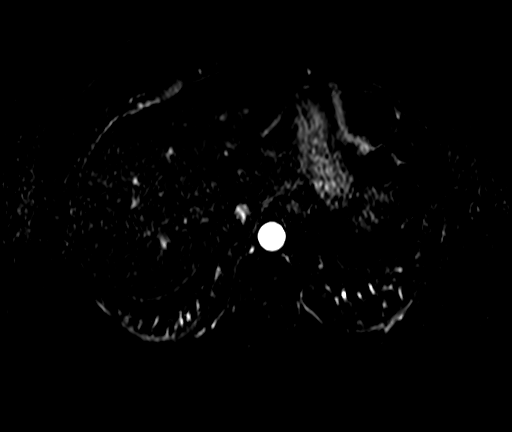
[im 104/104]
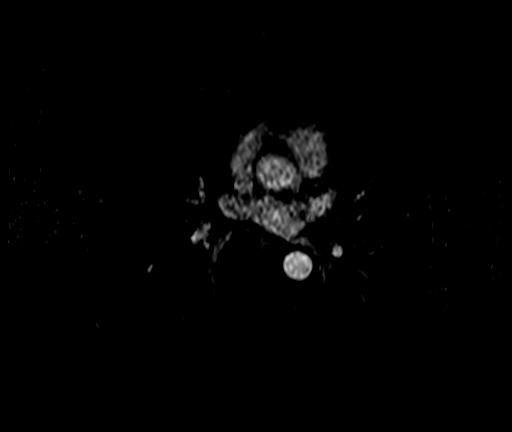

[Series 13: T1 dynamic post-contrast · axial · 2.5mm · 0.74mm/px · z∈[-55,+202]mm · 4 of 104 slices shown (3 of 4)]
[im 1/104]
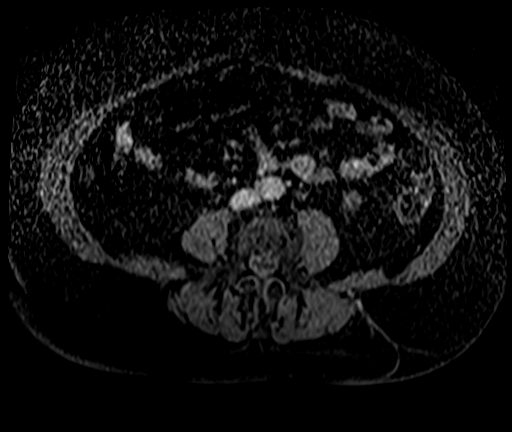
[im 35/104]
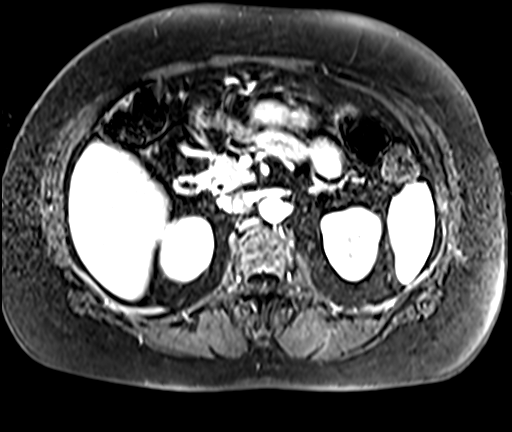
[im 69/104]
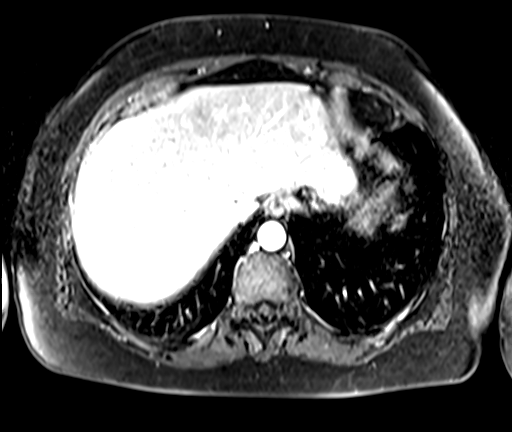
[im 104/104]
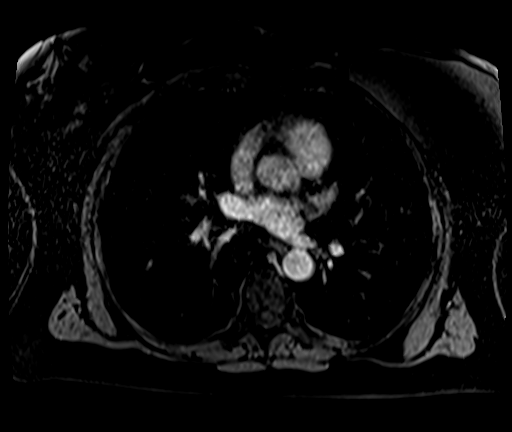

[Series 14: T1 dynamic post-contrast · axial · 2.5mm · 0.74mm/px · 1 of 104 slices shown (4 of 4)]
[im 1/104]
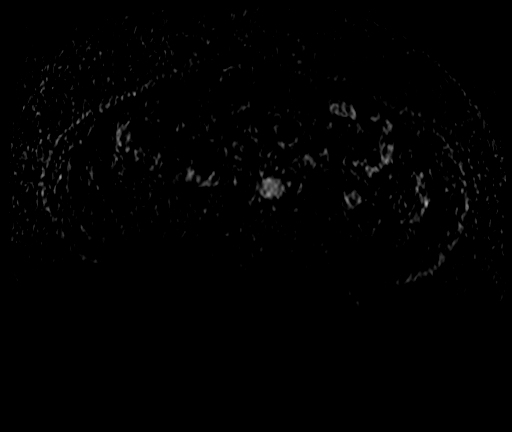

[26 of 48 positions shown; findings below may reference images not displayed]

FINDINGS: COMBINED FINDINGS FOR BOTH MR ABDOMEN AND PELVIS

Lower Chest: No acute findings.

Hepatobiliary: No hepatic masses identified. Moderate diffuse
hepatic steatosis is seen on chemical shift imaging. Gallbladder is
unremarkable. No evidence of biliary ductal dilatation.

Pancreas:  No mass or inflammatory changes.

Spleen: Normal appearance. No evidence of splenic mass or
splenomegaly.

Adrenals/Urinary Tract: A 1.5 cm left adrenal mass is seen which
shows signal dropout on chemical shift imaging, consistent with a
benign adrenal adenoma. (No followup imaging is recommended.) Both
kidneys are normal in appearance. No evidence of hydronephrosis.

Stomach/Bowel: No evidence of obstruction, inflammatory process or
abnormal fluid collections.

Vascular/Lymphatic: No pathologically enlarged lymph nodes. No acute
vascular findings.

Reproductive: Prior hysterectomy noted. Adnexal regions are
unremarkable in appearance.

Other:  None.

Musculoskeletal:  No suspicious bone lesions identified.
IMPRESSION: No acute findings.  No evidence of splenic mass.

1.5 cm benign left adrenal adenoma.

Moderate diffuse hepatic steatosis.

## 2021-09-23 MED ORDER — GADOBENATE DIMEGLUMINE 529 MG/ML IV SOLN
19.0000 mL | Freq: Once | INTRAVENOUS | Status: AC | PRN
  Administered 2021-09-23: 19 mL via INTRAVENOUS

## 2021-09-24 ENCOUNTER — Other Ambulatory Visit: Payer: PPO

## 2021-09-30 ENCOUNTER — Ambulatory Visit (INDEPENDENT_AMBULATORY_CARE_PROVIDER_SITE_OTHER): Payer: PPO | Admitting: Family Medicine

## 2021-09-30 ENCOUNTER — Encounter: Payer: Self-pay | Admitting: Family Medicine

## 2021-09-30 VITALS — BP 121/76 | HR 74 | Temp 98.0°F | Ht 68.0 in | Wt 220.4 lb

## 2021-09-30 DIAGNOSIS — D3502 Benign neoplasm of left adrenal gland: Secondary | ICD-10-CM

## 2021-09-30 DIAGNOSIS — I2510 Atherosclerotic heart disease of native coronary artery without angina pectoris: Secondary | ICD-10-CM

## 2021-09-30 DIAGNOSIS — I2583 Coronary atherosclerosis due to lipid rich plaque: Secondary | ICD-10-CM

## 2021-09-30 DIAGNOSIS — K760 Fatty (change of) liver, not elsewhere classified: Secondary | ICD-10-CM

## 2021-09-30 DIAGNOSIS — S46812A Strain of other muscles, fascia and tendons at shoulder and upper arm level, left arm, initial encounter: Secondary | ICD-10-CM | POA: Insufficient documentation

## 2021-09-30 DIAGNOSIS — M546 Pain in thoracic spine: Secondary | ICD-10-CM | POA: Diagnosis not present

## 2021-09-30 DIAGNOSIS — G8929 Other chronic pain: Secondary | ICD-10-CM | POA: Diagnosis not present

## 2021-09-30 DIAGNOSIS — S46812D Strain of other muscles, fascia and tendons at shoulder and upper arm level, left arm, subsequent encounter: Secondary | ICD-10-CM

## 2021-09-30 NOTE — Patient Instructions (Signed)
If you do not hear anything about your referral in the next 1-2 weeks, call our office and ask for an update. ? ?Heat (pad or rice pillow in microwave) over affected area, 10-15 minutes twice daily.  ? ?Ice/cold pack over area for 10-15 min twice daily. ? ?Let us know if you need anything. ? ?Mid-Back Strain Rehab ?It is normal to feel mild stretching, pulling, tightness, or discomfort as you do these exercises, but you should stop right away if you feel sudden pain or your pain gets worse.  ?Stretching and range of motion exercises ?This exercise warms up your muscles and joints and improves the movement and flexibility of your back and shoulders. This exercise also help to relieve pain. ?Exercise A: Chest and spine stretch ? ?Lie down on your back on a firm surface. ?Roll a towel or a small blanket so it is about 4 inches (10 cm) in diameter. ?Put the towel lengthwise under the middle of your back so it is under your spine, but not under your shoulder blades. ?To increase the stretch, you may put your hands behind your head and let your elbows fall to your sides. ?Hold for 30 seconds. ?Repeat exercise 2 times. Complete this exercise 3 times per week. ?Strengthening exercises ?These exercises build strength and endurance in your back and your shoulder blade muscles. Endurance is the ability to use your muscles for a long time, even after they get tired. ?Exercise C: Straight arm rows (shoulder extension) ?Stand with your feet shoulder width apart. ?Secure an exercise band to a stable object in front of you so the band is at or above shoulder height. ?Hold one end of the exercise band in each hand. ?Straighten your elbows and lift your hands up to shoulder height. ?Step back, away from the secured end of the exercise band, until the band stretches. ?Squeeze your shoulder blades together and pull your hands down to the sides of your thighs. Stop when your hands are straight down by your sides. Do not let your hands  go behind your body. ?Hold for 2 seconds. ?Slowly return to the starting position. ?Repeat 2 times. Complete this exercise 3 times per week. ?Exercise D: Shoulder external rotation, prone ?Lie on your abdomen on a firm bed so your left / right forearm hangs over the edge of the bed and your upper arm is on the bed, straight out from your body. ?Your elbow should be bent. ?Your palm should be facing your feet. ?If instructed, hold a 2-5 lb weight in your hand. ?Squeeze your shoulder blade toward the middle of your back. Do not let your shoulder lift toward your ear. ?Keep your elbow bent in an "L" shape (90 degrees) while you slowly move your forearm up toward the ceiling. Move your forearm up to the height of the bed, toward your head. ?Your upper arm should not move. ?At the top of the movement, your palm should face the floor. ?Hold for 1 second. ?Slowly return to the starting position and relax your muscles. ?Repeat 2 times. Complete this exercise 3 times per week. ?Exercise E: Scapular retraction and external rotation, rowing ? ?Sit in a stable chair without armrests, or stand. ?Secure an exercise band to a stable object in front of you so it is at shoulder height. ?Hold one end of the exercise band in each hand. ?Bring your arms out straight in front of you. ?Step back, away from the secured end of the exercise band, until the band  stretches. ?Pull the band backward. As you do this, bend your elbows and squeeze your shoulder blades together, but avoid letting the rest of your body move. Do not let your shoulders lift up toward your ears. ?Stop when your elbows are at your sides or slightly behind your body. ?Hold for 1 second1. ?Slowly straighten your arms to return to the starting position. ?Repeat 2 times. Complete this exercise 3 times per week. ?Posture and body mechanics ? ?Body mechanics refers to the movements and positions of your body while you do your daily activities. Posture is part of body  mechanics. Good posture and healthy body mechanics can help to relieve stress in your body's tissues and joints. Good posture means that your spine is in its natural S-curve position (your spine is neutral), your shoulders are pulled back slightly, and your head is not tipped forward. The following are general guidelines for applying improved posture and body mechanics to your everyday activities. ?Standing ? ?When standing, keep your spine neutral and your feet about hip-width apart. Keep a slight bend in your knees. Your ears, shoulders, and hips should line up. ?When you do a task in which you lean forward while standing in one place for a long time, place one foot up on a stable object that is 2-4 inches (5-10 cm) high, such as a footstool. This helps keep your spine neutral. ?Sitting ? ?When sitting, keep your spine neutral and keep your feet flat on the floor. Use a footrest, if necessary, and keep your thighs parallel to the floor. Avoid rounding your shoulders, and avoid tilting your head forward. ?When working at a desk or a computer, keep your desk at a height where your hands are slightly lower than your elbows. Slide your chair under your desk so you are close enough to maintain good posture. ?When working at a computer, place your monitor at a height where you are looking straight ahead and you do not have to tilt your head forward or downward to look at the screen. ?Resting ? ?When lying down and resting, avoid positions that are most painful for you. ?If you have pain with activities such as sitting, bending, stooping, or squatting (flexion-based activities), lie in a position in which your body does not bend very much. For example, avoid curling up on your side with your arms and knees near your chest (fetal position). ?If you have pain with activities such as standing for a long time or reaching with your arms (extension-based activities), lie with your spine in a neutral position and bend your  knees slightly. Try the following positions: ?Lying on your side with a pillow between your knees. ?Lying on your back with a pillow under your knees. ? ?Lifting ? ?When lifting objects, keep your feet at least shoulder-width apart and tighten your abdominal muscles. ?Bend your knees and hips and keep your spine neutral. It is important to lift using the strength of your legs, not your back. Do not lock your knees straight out. ?Always ask for help to lift heavy or awkward objects. ?Make sure you discuss any questions you have with your health care provider. ?Document Released: 06/08/2005 Document Revised: 02/13/2016 Document Reviewed: 03/20/2015 ?Elsevier Interactive Patient Education ? 2018 [employer]. ?

## 2021-09-30 NOTE — Progress Notes (Signed)
Chief Complaint  ?Patient presents with  ? Follow-up  ?  MRI results ?Back pain ?Neck pain ?  ? ? ?Subjective: ?Patient is a 71 y.o. female here for f/u MRI. ? ?Patient had a MRI of her abdomen/pelvis ordered by the sports medicine team.  Did not show anything sinister.  It did show a 1.5 cm left adrenal adenoma in addition to hepatic steatosis.  She is curious what the former diagnosis is.  She is aware of the latter diagnoses and is focusing on improving her diet with her husband. ? ?Patient started Zetia 10 mg daily prescribed by her cardiology team.  They are recommending a twice monthly injection to help lower her LDL below 70 given her family history and nonobstructive coronary artery disease.  She is hesitant to start this. ? ?Patient continues to have left upper neck/shoulder pain in addition to left-sided mid thoracic pain.  This has been going on for nearly 18 months.  Initially osteopathic manipulation and adjustments were helpful.  Sometimes they will cause worsening pain at this point.  No new neurologic signs or symptoms.  She denies any bruising, swelling, or redness.  She had physical therapy in the past which was somewhat helpful but the therapy team was worried something else could be contributing to the pain.  The MRI has now ruled out any such cause. ? ?Past Medical History:  ?Diagnosis Date  ? Hypertension   ? Osteopenia   ? ? ?Objective: ?BP 121/76   Pulse 74   Temp 98 ?F (36.7 ?C) (Oral)   Ht '5\' 8"'$  (1.727 m)   Wt 220 lb 6 oz (100 kg)   SpO2 98%   BMI 33.51 kg/m?  ?General: Awake, appears stated age ?Heart: RRR, no LE edema ?Lungs: CTAB, no rales, wheezes or rhonchi. No accessory muscle use ?MSK: TTP over the left medial trapezius and left rib cage at the posterior axillary line and posterior ankle at approximately R8 through 10.  I do not appreciate any crepitus, fluctuance, excessive warmth, or swelling. ?Neuro: DTRs equal and symmetric throughout, no clonus, no cerebellar signs, 5/5  strength in the upper extremities bilaterally. ?Psych: Age appropriate judgment and insight, normal affect and mood ? ?Assessment and Plan: ?Coronary artery disease due to lipid rich plaque - Plan: Ambulatory referral to Cardiology ? ?Hepatic steatosis ? ?Adenoma of left adrenal gland ? ?Strain of left trapezius muscle, subsequent encounter ? ?Chronic left-sided thoracic back pain ? ?We discussed the potential benefits of lowering LDL to goal of less than 70.  She would like a second opinion.  We will refer to [facility] cardiology for their opinion.  Counseled on diet and exercise. ?Reassurance, improve diet could reverse things and the last time we checked her liver enzymes they were within normal limits. ?Reassurance, no follow-up required. ?Continue stretches and exercises in addition to ice, heat, and Tylenol.  She was seen with Dr. Tamala Julian since this coming appointment. ?New stretches and exercises provided, sports med appreciated.  If no plan, which I would be surprised if that was the case, will plan on trigger point injections. ?Follow-up as originally scheduled. ?The patient voiced understanding and agreement to the plan. ? ?I spent 40 minutes with the patient discussing the above plan in addition to reviewing her chart, imaging, and other specialty notes on the same day of the visit. ? ?[name], DO ?09/30/21  ?4:31 PM ? ? ? ? ?

## 2021-10-08 NOTE — Progress Notes (Signed)
?Tonya [name] D.O. ?[facility] ?[place] ?Phone: [phone] ?Subjective:   ?I, Tonya [name], am serving as a scribe for Dr. Hulan Saas. ? ?This visit occurred during the SARS-CoV-2 public health emergency.  Safety protocols were in place, including screening questions prior to the visit, additional usage of staff PPE, and extensive cleaning of exam room while observing appropriate contact time as indicated for disinfecting solutions.  ?I'm seeing this patient by the request  of:  Nani Ravens Crosby Oyster, DO ? ?CC: Thoracic and low back pain ? ?HFW:YOVZCHYIFO  ?08/27/2021 ?Has had this pain previously but now little more abdominal pain.  Questionable enlargement of the spleen felt noted today as well.  Discussed with patient icing regimen and home exercises.  We will get abdominal ultrasound to further evaluate due to patient's concern.  I am more concerned that patient did take ibuprofen and could be having a very small ulcer.  Patient denies though any type of change in bowel and states that her reflux seem to be back to her baseline. ? ?Chronic, with exacerbation.  Seems to be secondary to patient compensating for the toe.  Patient continues to have discomfort of the back and more on the left side now.  Does have some abdominal pain associated with it as well.  Discussed with patient about home exercises in which activities to avoid.  Increase activity slowly.  Would like to see patient again in approximately 6 weeks to see how patient is responding. ? ?Update 10/09/2021 ?Tonya [name] is a 71 y.o. female coming in with complaint of thoracic and LBP. Patient states that her pain was better after last visit but by the next day her pain was horrible. Notes pins and needle sensation at times as well as muscular tightness. Patient feels like standing and walking increases her pain. Pain will radiating from L scapula down into L lumbar spine. Also has pain in L trap.   ? ?Due to patient's abdominal pain MRI of the abdomen and pelvis was ordered.  Patient MRI of the abdomen showed a benign left adrenal adenoma and diffuse fatty liver.  Otherwise was unremarkable. ?MRI of the thoracic spine was unremarkable previously. ?  ? ?Past Medical History:  ?Diagnosis Date  ? Hypertension   ? Osteopenia   ? ?Past Surgical History:  ?Procedure Laterality Date  ? ABDOMINAL HYSTERECTOMY  2015  ? AUGMENTATION MAMMAPLASTY Left   ? BREAST BIOPSY Right 02/2019  ? BREAST IMPLANT EXCHANGE    ? FOOT SURGERY Left 05/25/2020  ? MASTECTOMY Left   ? REDUCTION MAMMAPLASTY Right   ? [place]  ? ?Social History  ? ?Socioeconomic History  ? Marital status: Married  ?  Spouse name: Not on file  ? Number of children: Not on file  ? Years of education: Not on file  ? Highest education level: Not on file  ?Occupational History  ? Not on file  ?Tobacco Use  ? Smoking status: Never  ? Smokeless tobacco: Never  ?Vaping Use  ? Vaping Use: Never used  ?Substance and Sexual Activity  ? Alcohol use: Not on file  ?  Comment: rarely  ? Drug use: Never  ? Sexual activity: Yes  ?  Birth control/protection: None  ?Other Topics Concern  ? Not on file  ?Social History Narrative  ? Not on file  ? ?Social Determinants of Health  ? ?Financial Resource Strain: Not on file  ?Food Insecurity: Not on file  ?  Transportation Needs: Not on file  ?Physical Activity: Not on file  ?Stress: Not on file  ?Social Connections: Not on file  ? ?Allergies  ?Allergen Reactions  ? Crestor [Rosuvastatin]   ?  Myalgias ?  ? Lipitor [Atorvastatin]   ?  myalgias  ? ?Family History  ?Problem Relation Age of Onset  ? Heart attack Father   ?     died from  ? Breast cancer Maternal Aunt   ? Breast cancer Paternal Aunt   ? ? ? ?Current Outpatient Medications (Cardiovascular):  ?  Evolocumab (REPATHA SURECLICK) 712 MG/ML SOAJ, Inject 140 mg into the skin every 14 (fourteen) days. ?  losartan (COZAAR) 50 MG tablet, TAKE 1 TABLET BY  MOUTH EVERY DAY ?  ezetimibe (ZETIA) 10 MG tablet, Take 1 tablet (10 mg total) by mouth daily. ? ?Current Outpatient Medications (Respiratory):  ?  montelukast (SINGULAIR) 10 MG tablet, TAKE 1 TABLET BY MOUTH EVERYDAY AT BEDTIME ? ? ? ?Current Outpatient Medications (Other):  ?  acyclovir (ZOVIRAX) 400 MG tablet, Take 1 tablet (400 mg total) by mouth 3 (three) times daily. ?  calcium-vitamin D (OSCAL WITH D) 250-125 MG-UNIT tablet, Take 1 tablet by mouth daily. ?  metroNIDAZOLE (METROGEL) 0.75 % vaginal gel, Place 1 Applicatorful vaginally at bedtime. Apply one applicatorful to vagina at bedtime for 10 days, then twice a week for 6 months. ?  pantoprazole (PROTONIX) 20 MG tablet, Take 1 tablet (20 mg total) by mouth daily. ? ? ?Reviewed prior external information including notes and imaging from  ?primary care provider ?As well as notes that were available from care everywhere and other healthcare systems. ? ?Past medical history, social, surgical and family history all reviewed in electronic medical record.  No pertanent information unless stated regarding to the chief complaint.  ? ?Review of Systems: ? No headache, visual changes, nausea, vomiting, diarrhea, constipation, dizziness, abdominal pain, skin rash, fevers, chills, night sweats, weight loss, swollen lymph nodes, joint swelling, chest pain, shortness of breath, mood changes. POSITIVE muscle aches, body aches ? ?Objective  ?Blood pressure 120/88, pulse 68, height '5\' 8"'$  (1.727 m), weight 218 lb (98.9 kg), SpO2 96 %. ?  ?General: No apparent distress alert and oriented x3 mood and affect normal, dressed appropriately.  ?HEENT: Pupils equal, extraocular movements intact  ?Respiratory: Patient's speak in full sentences and does not appear short of breath  ?Cardiovascular: No lower extremity edema, non tender, no erythema  ?Gait normal with good balance and coordination.  ?MSK: Continues to have tenderness to palpation noted over the aspect of the rib cage on  the left side.  Seems to be more over the soft tissue just inferior to the rib cage.  No significant masses appreciated at this moment.  Patient does have some mild weakness noted of hip abductors bilaterally. ? ? ? ?  ?Impression and Recommendations:  ?  ? ?The above documentation has been reviewed and is accurate and complete Lyndal Pulley, DO ? ? ? ?

## 2021-10-09 ENCOUNTER — Encounter: Payer: Self-pay | Admitting: Family Medicine

## 2021-10-09 ENCOUNTER — Ambulatory Visit: Payer: PPO | Admitting: Family Medicine

## 2021-10-09 VITALS — BP 120/88 | HR 68 | Ht 68.0 in | Wt 218.0 lb

## 2021-10-09 DIAGNOSIS — M545 Low back pain, unspecified: Secondary | ICD-10-CM

## 2021-10-09 DIAGNOSIS — G8929 Other chronic pain: Secondary | ICD-10-CM | POA: Diagnosis not present

## 2021-10-09 MED ORDER — ACYCLOVIR 400 MG PO TABS: 400.0000 mg | ORAL_TABLET | Freq: Three times a day (TID) | ORAL | 0 refills | Status: DC

## 2021-10-09 NOTE — Patient Instructions (Signed)
PT at [facility] ?Think about acyclovir ?We have ruled out everything else ?See you again in 2-3 months ?

## 2021-10-09 NOTE — Assessment & Plan Note (Signed)
Low back pain, continues to have discomfort overall.  Patient has had MRIs that did not show any significant findings at the moment.  This includes a thoracic as well as the MRI of the abdomen and pelvis.  Patient continues to have more of a radicular symptoms that could be potentially a atypical shingles presentation and was given acyclovir today.  Discussed that I do feel formal physical therapy or aquatic therapy would be beneficial and referral placed today.  Patient no longer responds well to the osteopathic manipulation to hold on this I would say the differential at this point would include potential fibromyalgia and would need to consider the possibility of medications which patient would be hesitant of taking.  Follow-up with me again in 2 to 3 months.  Total time with patient and reviewing patient's imaging 32 minutes ?

## 2021-10-20 ENCOUNTER — Ambulatory Visit: Payer: PPO | Admitting: Cardiology

## 2021-10-20 ENCOUNTER — Encounter: Payer: Self-pay | Admitting: Cardiology

## 2021-10-20 VITALS — BP 111/60 | HR 66 | Temp 98.4°F | Resp 16 | Ht 68.0 in | Wt 218.0 lb

## 2021-10-20 DIAGNOSIS — E7800 Pure hypercholesterolemia, unspecified: Secondary | ICD-10-CM

## 2021-10-20 DIAGNOSIS — I10 Essential (primary) hypertension: Secondary | ICD-10-CM | POA: Diagnosis not present

## 2021-10-20 DIAGNOSIS — I2510 Atherosclerotic heart disease of native coronary artery without angina pectoris: Secondary | ICD-10-CM | POA: Diagnosis not present

## 2021-10-20 DIAGNOSIS — I2583 Coronary atherosclerosis due to lipid rich plaque: Secondary | ICD-10-CM | POA: Diagnosis not present

## 2021-10-20 MED ORDER — PRAVASTATIN SODIUM 40 MG PO TABS: 40.0000 mg | ORAL_TABLET | Freq: Every evening | ORAL | 0 refills | Status: DC

## 2021-10-20 MED ORDER — ASPIRIN EC 81 MG PO TBEC: 81.0000 mg | DELAYED_RELEASE_TABLET | Freq: Every day | ORAL | 11 refills | Status: DC

## 2021-10-20 NOTE — Progress Notes (Signed)
? ?ID:  Tonya [name], DOB 01-20-1951, MRN [id number] ? ?PCP:  [name], DO  ?Cardiologist:  Rex Kras, DO, [facility] (established care Oct 20, 2021) ?Former Cardiology Providers: Dr. Lubertha Basque ([place]), Dr. Oswaldo Milian (04/2021) ? ?REASON FOR CONSULT: Evaluation for coronary artery disease. ? ?REQUESTING PHYSICIAN:  ?[name], DO ?[address] ?[place],  [place] [address] ? ?Chief Complaint  ?Patient presents with  ?  Coronary artery disease due to lipid rich plaque  ? New Patient (Initial Visit)  ? ? ?HPI  ?Tonya [name] is a 71 y.o. Caucasian female whose past medical history and cardiovascular risk factors include: Hypertension, mild nonobstructive CAD (coronary CTA 08/6142), diastolic dysfunction, family history of heart disease, osteopenia, Hx of breast cancer, postmenopausal female, advanced age. ? ?She is referred to the office at the request of [name]* for evaluation of evaluation for coronary artery disease. ? ?She has been under the care of Dr. Lubertha Basque (at [facility]) followed by Dr. Elie Goody (at [facility]) and now comes to [facility] for second opinion and re-establish care.  She was noted to have precordial discomfort and underwent coronary CTA in August 2022 which noted mild nonobstructive CAD with mixed plaque in the proximal LAD and total coronary calcium score of 9 placing her at the 50th percentile.  She was recommended to be on statin therapy.  She was initially started on atorvastatin 20 mg p.o. nightly but was unable to tolerate it and therefore switched to rosuvastatin 10 mg p.o. nightly which she did not tolerate either.  Because of the statin medication was causing her to have headaches and nausea and she was referred to lipid clinic for possible PCSK9 inhibitor therapy ? ?She is currently on Zetia and tolerating it well.  Repeat labs noted improvement in LDL but it is not at goal.  She would like to avoid injectable therapy if possible  and is willing to retry statins. ? ?Clinically she denies angina pectoris or heart failure symptoms. ? ?ALLERGIES: ?Allergies  ?Allergen Reactions  ? Crestor [Rosuvastatin]   ?  Myalgias ?  ? Lipitor [Atorvastatin]   ?  myalgias  ? ? ?MEDICATION LIST PRIOR TO VISIT: ?Current Meds  ?Medication Sig  ? aspirin EC 81 MG tablet Take 1 tablet (81 mg total) by mouth daily. Swallow whole.  ? atenolol (TENORMIN) 50 MG tablet   ? calcium-vitamin D (OSCAL WITH D) 250-125 MG-UNIT tablet Take 1 tablet by mouth daily.  ? cetirizine (ZYRTEC) 10 MG tablet Take 5 mg by mouth daily.  ? ezetimibe (ZETIA) 10 MG tablet Take 1 tablet (10 mg total) by mouth daily.  ? losartan (COZAAR) 50 MG tablet TAKE 1 TABLET BY MOUTH EVERY DAY  ? metroNIDAZOLE (METROGEL) 0.75 % vaginal gel Place 1 Applicatorful vaginally at bedtime. Apply one applicatorful to vagina at bedtime for 10 days, then twice a week for 6 months.  ? montelukast (SINGULAIR) 10 MG tablet TAKE 1 TABLET BY MOUTH EVERYDAY AT BEDTIME  ? pantoprazole (PROTONIX) 20 MG tablet Take 1 tablet (20 mg total) by mouth daily.  ? pravastatin (PRAVACHOL) 40 MG tablet Take 1 tablet (40 mg total) by mouth every evening.  ?  ? ?PAST MEDICAL HISTORY: ?Past Medical History:  ?Diagnosis Date  ? Hypertension   ? Osteopenia   ? ? ?PAST SURGICAL HISTORY: ?Past Surgical History:  ?Procedure Laterality Date  ? ABDOMINAL HYSTERECTOMY  2015  ? AUGMENTATION MAMMAPLASTY Left   ? BREAST BIOPSY Right 02/2019  ?  BREAST IMPLANT EXCHANGE    ? FOOT SURGERY Left 05/25/2020  ? MASTECTOMY Left   ? REDUCTION MAMMAPLASTY Right   ? TONSILLECTOMY AND ADENOIDECTOMY  1958  ? ? ?FAMILY HISTORY: ?The patient family history includes Breast cancer in her maternal aunt and paternal aunt; Cancer in her brother; Heart attack in her father and mother; Lung disease in her brother. ? ?SOCIAL HISTORY:  ?The patient  reports that she has never smoked. She has never used smokeless tobacco. She reports current alcohol use. She reports  that she does not use drugs. ? ?REVIEW OF SYSTEMS: ?Review of Systems  ?Cardiovascular:  Negative for chest pain, claudication, dyspnea on exertion, leg swelling, near-syncope, orthopnea, palpitations, paroxysmal nocturnal dyspnea and syncope.  ?Respiratory:  Negative for shortness of breath.   ?Musculoskeletal:  Positive for back pain.  ? ?PHYSICAL EXAM: ? ?  10/20/2021  ?  9:02 AM 10/09/2021  ? 11:46 AM 09/30/2021  ?  1:19 PM  ?Vitals with BMI  ?Height 5' 8"  5' 8"  5' 8"   ?Weight 218 lbs 218 lbs 220 lbs 6 oz  ?BMI 33.15 33.15 33.52  ?Systolic 998 338 250  ?Diastolic 60 88 76  ?Pulse 66 68 74  ? ? ?CONSTITUTIONAL: Well-developed and well-nourished. No acute distress.  ?SKIN: Skin is warm and dry. No rash noted. No cyanosis. No pallor. No jaundice ?HEAD: Normocephalic and atraumatic.  ?EYES: No scleral icterus ?MOUTH/THROAT: Moist oral membranes.  ?NECK: No JVD present. No thyromegaly noted. No carotid bruits  ?LYMPHATIC: No visible cervical adenopathy.  ?CHEST Normal respiratory effort. No intercostal retractions  ?LUNGS: Clear to auscultation bilaterally.  No stridor. No wheezes. No rales.  ?CARDIOVASCULAR: Regular rate and rhythm, positive S1-S2, no murmurs rubs or gallops appreciated. ?ABDOMINAL: Obese, soft, nontender, nondistended, positive bowel sounds in all 4 quadrants, no apparent ascites.  ?EXTREMITIES: No peripheral edema, warm to touch, 2+ bilateral DP and PT pulses ?HEMATOLOGIC: No significant bruising ?NEUROLOGIC: Oriented to person, place, and time. Nonfocal. Normal muscle tone.  ?PSYCHIATRIC: Normal mood and affect. Normal behavior. Cooperative ? ?CARDIAC DATABASE: ?EKG: ?Oct 20, 2021: Normal sinus rhythm, 64 bpm, without underlying ischemia injury pattern ? ?Echocardiogram: ?02/25/2021: ?LVEF 53-97%, grade 1 diastolic impairment, RV systolic function low normal, trivial MR, estimated RAP 3 mmHg. ? ?Stress Testing: ?No results found for this or any previous visit from the past 1095 days. ? ?CCTA   ?02/10/2021: ?Mild nonobstructive CAD. ?Coronary calcium score 9.2, 50th percentile ?Noncardiac findings: Steatotic liver ? ?Heart Catheterization: ?None ? ?LABORATORY DATA: ? ?  Latest Ref Rng & Units 09/20/2020  ? 11:29 AM 08/25/2019  ?  9:19 AM  ?CBC  ?WBC 4.0 - 10.5 K/uL 7.9   8.2    ?Hemoglobin 12.0 - 15.0 g/dL 14.2   13.9    ?Hematocrit 36.0 - 46.0 % 41.8   41.1    ?Platelets 150.0 - 400.0 K/uL 181.0   206    ? ? ? ?  Latest Ref Rng & Units 07/28/2021  ? 10:12 AM 02/12/2021  ? 11:14 AM 01/29/2021  ? 11:18 AM  ?CMP  ?Glucose 65 - 99 mg/dL   77    ?BUN 8 - 27 mg/dL   15    ?Creatinine 0.57 - 1.00 mg/dL   0.84    ?Sodium 134 - 144 mmol/L   142    ?Potassium 3.5 - 5.2 mmol/L   5.0    ?Chloride 96 - 106 mmol/L   102    ?CO2 20 - 29 mmol/L  24    ?Calcium 8.7 - 10.3 mg/dL   9.9    ?Total Protein 6.0 - 8.3 g/dL 7.0   7.1     ?Total Bilirubin 0.2 - 1.2 mg/dL 1.1   0.7     ?Alkaline Phos 39 - 117 U/L 63   89     ?AST 0 - 37 U/L 26   31     ?ALT 0 - 35 U/L 27   34     ? ? ?Lipid Panel  ?Lab Results  ?Component Value Date  ? CHOL 182 07/28/2021  ? HDL 62.60 07/28/2021  ? LDLCALC 101 (H) 07/28/2021  ? TRIG 89.0 07/28/2021  ? CHOLHDL 3 07/28/2021  ? ? ?No components found for: NTPROBNP ?No results for input(s): PROBNP in the last 8760 hours. ?No results for input(s): TSH in the last 8760 hours. ? ?BMP ?Recent Labs  ?  01/29/21 ?1118  ?NA 142  ?K 5.0  ?CL 102  ?CO2 24  ?GLUCOSE 77  ?BUN 15  ?CREATININE 0.84  ?CALCIUM 9.9  ? ? ?HEMOGLOBIN A1C ?No results found for: HGBA1C, MPG ? ?IMPRESSION: ? ?  ICD-10-CM   ?1. Coronary artery disease due to lipid rich plaque  I25.10 EKG 12-Lead  ? I25.83 pravastatin (PRAVACHOL) 40 MG tablet  ?  aspirin EC 81 MG tablet  ?  ?2. Pure hypercholesterolemia  E78.00 Lipid Panel With LDL/HDL Ratio  ?  LDL cholesterol, direct  ?  CMP14+EGFR  ?  ?3. Essential hypertension  I10   ?  ?4. Steatosis of liver  K76.0   ?  ?  ? ?RECOMMENDATIONS: ?CHUDNEY SCHEFFLER is a 71 y.o. Caucasian  female whose past medical  history and cardiac risk factors include: Hypertension, mild nonobstructive CAD (coronary CTA 07/5747), diastolic dysfunction, family history of heart disease, osteopenia, Hx of breast cancer, postmenopausal female, ad

## 2021-10-22 ENCOUNTER — Encounter (HOSPITAL_BASED_OUTPATIENT_CLINIC_OR_DEPARTMENT_OTHER): Payer: PPO | Admitting: Physical Therapy

## 2021-10-27 ENCOUNTER — Encounter: Payer: Self-pay | Admitting: Family Medicine

## 2021-10-27 ENCOUNTER — Ambulatory Visit (INDEPENDENT_AMBULATORY_CARE_PROVIDER_SITE_OTHER): Payer: PPO | Admitting: Family Medicine

## 2021-10-27 VITALS — BP 114/72 | HR 75 | Temp 97.9°F | Ht 67.0 in | Wt 219.2 lb

## 2021-10-27 DIAGNOSIS — B353 Tinea pedis: Secondary | ICD-10-CM

## 2021-10-27 MED ORDER — KETOCONAZOLE 2 % EX CREA: 1.0000 "application " | TOPICAL_CREAM | Freq: Every day | CUTANEOUS | 0 refills | Status: AC

## 2021-10-27 NOTE — Patient Instructions (Signed)
Keep the foot clean and dry. ? ?Be careful putting clothes on to avoid contact with your foot when possible. ? ?Let us know if you need anything. ?

## 2021-10-27 NOTE — Progress Notes (Signed)
Chief Complaint  ?Patient presents with  ? redness between toes  ? ? ?Tonya [name] is a 71 y.o. female here for a skin complaint. ? ?Duration: several weeks ?Location: Between toes on the left foot ?Pruritic? Yes ?Painful?  Yes initially, not currently ?Drainage? No ?New soaps/lotions/topicals/detergents? No ?Sick contacts? No ?Other associated symptoms: Has been wearing sneakers more recently ?Therapies tried thus far: Over-the-counter Lotrimin ? ?Past Medical History:  ?Diagnosis Date  ? Coronary atherosclerosis due to calcified coronary lesion   ? History of breast cancer   ? Hyperlipidemia   ? Hypertension   ? Osteopenia   ? ? ?BP 114/72   Pulse 75   Temp 97.9 ?F (36.6 ?C) (Oral)   Ht '5\' 7"'$  (1.702 m)   Wt 219 lb 4 oz (99.5 kg)   SpO2 99%   BMI 34.34 kg/m?  ?Gen: awake, alert, appearing stated age ?Lungs: No accessory muscle use ?Skin: Macerated tissue between 4/5 and 3/4 digits on the left. No drainage, erythema, TTP, fluctuance, excoriation ?Psych: Age appropriate judgment and insight ? ?Tinea pedis of left foot - Plan: ketoconazole (NIZORAL) 2 % cream ? ?6 weeks of daily use of the above cream.  Try to keep feet warm and dry.  Follow-up as originally scheduled. ?The patient voiced understanding and agreement to the plan. ? ?[name], DO ?10/27/21 ?11:56 AM ? ?

## 2021-10-28 ENCOUNTER — Encounter (HOSPITAL_BASED_OUTPATIENT_CLINIC_OR_DEPARTMENT_OTHER): Payer: Self-pay | Admitting: Physical Therapy

## 2021-10-28 ENCOUNTER — Ambulatory Visit (HOSPITAL_BASED_OUTPATIENT_CLINIC_OR_DEPARTMENT_OTHER): Payer: PPO | Attending: Family Medicine | Admitting: Physical Therapy

## 2021-10-28 DIAGNOSIS — M545 Low back pain, unspecified: Secondary | ICD-10-CM | POA: Insufficient documentation

## 2021-10-28 DIAGNOSIS — M542 Cervicalgia: Secondary | ICD-10-CM | POA: Insufficient documentation

## 2021-10-28 DIAGNOSIS — G8929 Other chronic pain: Secondary | ICD-10-CM | POA: Diagnosis not present

## 2021-10-28 DIAGNOSIS — M546 Pain in thoracic spine: Secondary | ICD-10-CM | POA: Diagnosis not present

## 2021-10-28 NOTE — Therapy (Signed)
?OUTPATIENT PHYSICAL THERAPY THORACOLUMBAR EVALUATION ? ? ?Patient Name: [facility] ?MRN: [id number] ?DOB:10-19-1950, 71 y.o., female ?Today's Date: 10/29/2021 ? ? PT End of Session - 10/28/21 1452   ? ? Visit Number 1   ? Number of Visits 12   ? Date for PT Re-Evaluation 12/09/21   ? Authorization Type HTA   ? PT Start Time 1355   ? PT Stop Time 3235   ? PT Time Calculation (min) 48 min   ? Activity Tolerance Patient tolerated treatment well   ? Behavior During Therapy [facility] for tasks assessed/performed   ? ?  ?  ? ?  ? ? ?Past Medical History:  ?Diagnosis Date  ? Coronary atherosclerosis due to calcified coronary lesion   ? History of breast cancer   ? Hyperlipidemia   ? Hypertension   ? Osteopenia   ? ?Past Surgical History:  ?Procedure Laterality Date  ? ABDOMINAL HYSTERECTOMY  2015  ? AUGMENTATION MAMMAPLASTY Left   ? BREAST BIOPSY Right 02/2019  ? BREAST IMPLANT EXCHANGE    ? FOOT SURGERY Left 05/25/2020  ? MASTECTOMY Left   ? REDUCTION MAMMAPLASTY Right   ? [place]  ? ?Patient Active Problem List  ? Diagnosis Date Noted  ? Coronary artery disease due to lipid rich plaque 09/30/2021  ? Hepatic steatosis 09/30/2021  ? Adenoma of left adrenal gland 09/30/2021  ? Strain of left trapezius muscle 09/30/2021  ? Hyperlipidemia 05/19/2021  ? Somatic dysfunction of spine, thoracic 03/26/2021  ? Chronic left-sided thoracic back pain 12/30/2020  ? DCIS (ductal carcinoma in situ) 01/26/2020  ? Low back pain 01/26/2020  ? Chronic pain of both knees 09/19/2019  ? Osteopenia   ? Estrogen deficiency 04/14/2018  ? Essential hypertension 01/10/2018  ? Gastroesophageal reflux disease 01/10/2018  ? Mild intermittent asthma without complication 57/32/2025  ? Pain in left lower leg 03/03/2017  ? Family history of colon cancer 02/23/2017  ? Herpes simplex vulvovaginitis 02/18/2017  ? Neuropathy 02/18/2017  ? Elevated liver enzymes 02/17/2017  ? Need for Streptococcus pneumoniae vaccination 02/17/2017   ? Chondromalacia of left patella 12/28/2016  ? Synovitis of left knee 12/28/2016  ? Seasonal allergic rhinitis due to fungal spores 09/25/2016  ? Bone mass 09/24/2016  ? Chronic hip pain 09/24/2016  ? Dark yellow-colored urine 09/24/2016  ? Low oxygen saturation 09/24/2016  ? Other specified postprocedural states 01/17/2015  ? Abnormal stress electrocardiogram test 10/20/2013  ? Malignant neoplasm of female breast ([place]) 12/15/2010  ? Breast mass 12/11/2010  ? Family history of malignant neoplasm of breast 12/11/2010  ? ? ?PCP: [name], DO ? ?REFERRING PROVIDER: Lyndal Pulley, DO  ? ?REFERRING DIAG: M54.50,G89.29 (ICD-10-CM) - Chronic bilateral low back pain without sciatica  ?THERAPY DIAG:  ?Pain in thoracic spine ? ?Cervicalgia ? ?Joint stiffness in cervical ? ?ONSET DATE: 10/09/2021 MD order ? ?SUBJECTIVE:                                                                                                                                                                                          ? ?  SUBJECTIVE STATEMENT: ?Pt reports her pain began 1.5 years ago.  Pt reports no trauma or specific MOI.  The only thing she can correlate with the pain beginning is when she had an implant changed after having Breast CA.  Pt had PT for thoracic pain and states she may have improved cervical mobility but did not receive pain relief.  Pt was receiving spinal manipulations by DO.  She reports feeling better after the 1st 2-3 times though didn't receive much relief from manipulations after the first 2-3.  MD note indicated pt no longer responded well to the osteopathic manipulation. ?MD note indicated MRI of the thoracic spine was unremarkable and she has had MRIs that did not show any significant findings including MRI's of the thoracic, abdomen and pelvis.  ? ?Pt reports she will have significant pain cutting vegetables for 2 mins.  Occasionally pain with taking a deep breath.  Pt doesn't garden and is limited  with ambulation due to pain. ? ?PERTINENT HISTORY:  ?CAD, Hx of Breast CA (in remission for 12-13 years), Osteopenia ? ?PAIN:  ?Pt reports her pain in her entire thoracic spine and also has cervical pain.  Pain is located on L sided lower thoracic and L sided and central thoracic pain in mid and upper thoracic.  She has pain in L sided lower ribs which radiates toward her back in her lower thoracic.  Pt states she can have stabbing pain in cervical. ?Constant pain ?Aggravating Factors:  walking, standing, as the day progresses, cooking, prepping vegetables ?Easing Factors:  self manipulate her back when lying flat and arms spread out but pain returns, husband massaging, heat ? ? ?PRECAUTIONS: Other: osteopenia ? ?WEIGHT BEARING RESTRICTIONS No ? ?FALLS:  ?Has patient fallen in last 6 months? No ? ? ?OCCUPATION: Pt is retired ? ?PLOF: Independent; Pt was able to perform ADLs/IADLs and mobility with less pain.  ? ?PATIENT GOALS to be pain free in cervical and thoracic, increase walking without pain, to be able cook with less pain, to be able to get on the floor and play with grandchildren ? ? ?OBJECTIVE:  ? ?DIAGNOSTIC FINDINGS:  ?Thoracic MRI ?IMPRESSION: ?1. No acute osseous abnormality of the thoracic spine. ?2. Mild multilevel thoracic spondylosis with scattered ?noncompressive disc protrusions. No foraminal or canal stenosis at ?any level. ? ?PATIENT SURVEYS:  ?FOTO 44 with a goal of 57 at visit #12 ? ? ?COGNITION: ? Overall cognitive status: Within functional limits for tasks assessed   ?  ? ?PALPATION: ?Soft tissue mobility is WFL in lower thoracic and mid thoracic.  Pt has some tightness in lumbar paraspinals and bilat UT.    ?Pt had tenderness to palpate SP's t/o thoracic vertebrae but not in paraspinals.  Increased tenderness in Upper thoracic SP's and Left superomedial shoulder.  No tenderness in Lumbar SPs ? ?Cervical AROM: ?Flex:  WFL ?Ext:  45 deg ?SB:  15 "feels tight" / 21 deg ?Rotation:   WFL ? ?LUMBAR/THORACIC ROM:  ? ?AROM AROM  ?10/29/2021  ?Flexion WFL/WNL  ?Extension WNL  ?Right lateral flexion WFL  ?Left lateral flexion WFL  ?Right rotation WFL / 75%  ?Left rotation WFL  ? (Blank rows = not tested) ? ?ROM: ? ?Active  Right ?10/29/2021 Left ?10/29/2021  ?Hip flexion    ?Hip extension    ?Hip abduction [facility] WFL  ?Hip adduction    ?Hip internal rotation    ?Hip external rotation    ?Knee flexion    ?Knee extension    ?  Shoulder Flexion WNL WNL  ?Shoulder Abduction WFL WFL  ?Ankle inversion    ?Ankle eversion    ? (Blank rows = not tested) ? ?LE MMT: ? ?MMT Right ?10/29/2021 Left ?10/29/2021  ?Hip flexion 5/5 5/5  ?Hip extension    ?Hip abduction 5/5 5/5  ?Hip adduction    ?Hip internal rotation    ?Hip external rotation    ?Knee flexion    ?Knee extension 5/5 4/5  ?Shoulder flexion 5/5 5/5  ?Shoulder Abduction 5/5 5/5  ?Ankle inversion    ?Ankle eversion    ? (Blank rows = not tested) ? ? ? ? ? ?TODAY'S TREATMENT  ?See below for pt education. ? ? ?PATIENT EDUCATION:  ?Education details: Dx, POC, relevant anatomy, objective findings, and rationale of exercises.  PT answered Pt's questions.  ?Person educated: Patient ?Education method: Explanation ?Education comprehension: verbalized understanding and needs further education ? ? ?HOME EXERCISE PROGRAM: ?Will give next Rx ? ?ASSESSMENT: ? ?CLINICAL IMPRESSION: ?Patient is a 71 y.o. female with a dx of chronic bilateral LBP without sciatica.  She c/o's of constant pain in her thoracic region.  She reports pain along her entire thoracic and she also has cervical pain.  Her pain increases as the day progresses.  She has good AROM t/o cervical, thoracic, and lumbar though is limited in R thoracic rotation and bilat cervical Sb'ing.  Pt has increased pain with walking, standing, and cooking.  She reports having significant pain cutting vegetables for 2 mins.  Pt doesn't garden and is limited with ambulation due to pain.  Pt should benefit from skilled PT  services.  ? ?OBJECTIVE IMPAIRMENTS decreased activity tolerance, decreased endurance, decreased mobility, difficulty walking, decreased ROM, decreased strength, hypomobility, increased fascial restrictions, impaired flexi

## 2021-10-30 ENCOUNTER — Other Ambulatory Visit: Payer: Self-pay

## 2021-10-30 ENCOUNTER — Ambulatory Visit (HOSPITAL_BASED_OUTPATIENT_CLINIC_OR_DEPARTMENT_OTHER): Payer: PPO | Admitting: Physical Therapy

## 2021-10-30 ENCOUNTER — Encounter (HOSPITAL_BASED_OUTPATIENT_CLINIC_OR_DEPARTMENT_OTHER): Payer: Self-pay | Admitting: Physical Therapy

## 2021-10-30 DIAGNOSIS — M62838 Other muscle spasm: Secondary | ICD-10-CM

## 2021-10-30 DIAGNOSIS — M546 Pain in thoracic spine: Secondary | ICD-10-CM

## 2021-10-30 DIAGNOSIS — M545 Low back pain, unspecified: Secondary | ICD-10-CM | POA: Diagnosis not present

## 2021-10-30 DIAGNOSIS — M542 Cervicalgia: Secondary | ICD-10-CM

## 2021-10-30 NOTE — Therapy (Signed)
?OUTPATIENT PHYSICAL THERAPY THORACOLUMBAR EVALUATION ? ? ?Patient Name: Tonya [name] [facility] ?MRN: [id number] ?DOB:[birth year], 71 y.o., female ?Today's Date: 10/30/2021 ? ? PT End of Session - 10/30/21 0934   ? ? Visit Number 2   ? Number of Visits 12   ? Date for PT Re-Evaluation 12/09/21   ? Authorization Type HTA   ? PT Start Time 0932   ? ?  ?  ? ?  ? ? ? ?Past Medical History:  ?Diagnosis Date  ? Coronary atherosclerosis due to calcified coronary lesion   ? History of breast cancer   ? Hyperlipidemia   ? Hypertension   ? Osteopenia   ? ?Past Surgical History:  ?Procedure Laterality Date  ? ABDOMINAL HYSTERECTOMY  2015  ? AUGMENTATION MAMMAPLASTY Left   ? BREAST BIOPSY Right 02/2019  ? BREAST IMPLANT EXCHANGE    ? FOOT SURGERY Left 05/25/2020  ? MASTECTOMY Left   ? REDUCTION MAMMAPLASTY Right   ? [place]  ? ?Patient Active Problem List  ? Diagnosis Date Noted  ? Coronary artery disease due to lipid rich plaque 09/30/2021  ? Hepatic steatosis 09/30/2021  ? Adenoma of left adrenal gland 09/30/2021  ? Strain of left trapezius muscle 09/30/2021  ? Hyperlipidemia 05/19/2021  ? Somatic dysfunction of spine, thoracic 03/26/2021  ? Chronic left-sided thoracic back pain 12/30/2020  ? DCIS (ductal carcinoma in situ) 01/26/2020  ? Low back pain 01/26/2020  ? Chronic pain of both knees 09/19/2019  ? Osteopenia   ? Estrogen deficiency 04/14/2018  ? Essential hypertension 01/10/2018  ? Gastroesophageal reflux disease 01/10/2018  ? Mild intermittent asthma without complication 28/36/6294  ? Pain in left lower leg 03/03/2017  ? Family history of colon cancer 02/23/2017  ? Herpes simplex vulvovaginitis 02/18/2017  ? Neuropathy 02/18/2017  ? Elevated liver enzymes 02/17/2017  ? Need for Streptococcus pneumoniae vaccination 02/17/2017  ? Chondromalacia of left patella 12/28/2016  ? Synovitis of left knee 12/28/2016  ? Seasonal allergic rhinitis due to fungal spores 09/25/2016  ? Bone mass 09/24/2016  ?  Chronic hip pain 09/24/2016  ? Dark yellow-colored urine 09/24/2016  ? Low oxygen saturation 09/24/2016  ? Other specified postprocedural states 01/17/2015  ? Abnormal stress electrocardiogram test 10/20/2013  ? Malignant neoplasm of female breast ([place]) 12/15/2010  ? Breast mass 12/11/2010  ? Family history of malignant neoplasm of breast 12/11/2010  ? ? ?PCP: [name], DO ? ?REFERRING PROVIDER: Lyndal Pulley, DO  ? ?REFERRING DIAG: M54.50,G89.29 (ICD-10-CM) - Chronic bilateral low back pain without sciatica  ?THERAPY DIAG:  ?Pain in thoracic spine ? ?Cervicalgia ? ?Other muscle spasm ? ?Joint stiffness in cervical ? ?ONSET DATE: 10/09/2021 MD order ? ?SUBJECTIVE:                                                                                                                                                                                          ? ?  SUBJECTIVE STATEMENT: ?Mornings are better ? ?PERTINENT HISTORY:  ?CAD, Hx of Breast CA (in remission for 12-13 years), Osteopenia ? ?PAIN:  ?PAIN:  ?Are you having pain? Yes: NPRS scale: not too bad right now/10 ?Pain location: Lt inf ribs and Lt cervical region ?Pain description: achey ?Aggravating factors: night pain ?Relieving factors: laying down ? ? ? ?PRECAUTIONS: Other: osteopenia ? ?WEIGHT BEARING RESTRICTIONS No ? ?FALLS:  ?Has patient fallen in last 6 months? No ? ? ?OCCUPATION: Pt is retired ? ?PLOF: Independent; Pt was able to perform ADLs/IADLs and mobility with less pain.  ? ?PATIENT GOALS to be pain free in cervical and thoracic, increase walking without pain, to be able cook with less pain, to be able to get on the floor and play with grandchildren ? ? ?OBJECTIVE:  ? ?DIAGNOSTIC FINDINGS:  ?Thoracic MRI ?IMPRESSION: ?1. No acute osseous abnormality of the thoracic spine. ?2. Mild multilevel thoracic spondylosis with scattered ?noncompressive disc protrusions. No foraminal or canal stenosis at ?any level. ? ?PATIENT SURVEYS:  ?FOTO 44 with  a goal of 57 at visit #12 ? ? ?COGNITION: ? Overall cognitive status: Within functional limits for tasks assessed   ?  ? ?PALPATION: ?Soft tissue mobility is WFL in lower thoracic and mid thoracic.  Pt has some tightness in lumbar paraspinals and bilat UT.    ?Pt had tenderness to palpate SP's t/o thoracic vertebrae but not in paraspinals.  Increased tenderness in Upper thoracic SP's and Left superomedial shoulder.  No tenderness in Lumbar SPs ? ?Cervical AROM: ?Flex:  WFL ?Ext:  45 deg ?SB:  15 "feels tight" / 21 deg ?Rotation:  WFL ? ?LUMBAR/THORACIC ROM:  ? ?AROM AROM  ?10/30/2021  ?Flexion WFL/WNL  ?Extension WNL  ?Right lateral flexion WFL  ?Left lateral flexion WFL  ?Right rotation WFL / 75%  ?Left rotation WFL  ? (Blank rows = not tested) ? ?ROM: ? ?Active  Right ?10/30/2021 Left ?10/30/2021  ?Hip flexion    ?Hip extension    ?Hip abduction [facility] WFL  ?Hip adduction    ?Hip internal rotation    ?Hip external rotation    ?Knee flexion    ?Knee extension    ?Shoulder Flexion WNL WNL  ?Shoulder Abduction WFL WFL  ?Ankle inversion    ?Ankle eversion    ? (Blank rows = not tested) ? ?LE MMT: ? ?MMT Right ?10/30/2021 Left ?10/30/2021  ?Hip flexion 5/5 5/5  ?Hip extension    ?Hip abduction 5/5 5/5  ?Hip adduction    ?Hip internal rotation    ?Hip external rotation    ?Knee flexion    ?Knee extension 5/5 4/5  ?Shoulder flexion 5/5 5/5  ?Shoulder Abduction 5/5 5/5  ?Ankle inversion    ?Ankle eversion    ? (Blank rows = not tested) ? ? ? ? ? ?TODAY'S TREATMENT  ?5/11: ?MANUAL ?TPDN with skilled palpation and monitoring- Lt upper trap ?Gross rib mobility, thoracic PAs ?Lt scapular mobility in prone ?Supine pec STM, shoulder posterior mobilizations ?Scap retraction ?Upper trap & levator stretch ? ? ? ?PATIENT EDUCATION:  ?Education details: Dx, POC, relevant anatomy, objective findings, and rationale of exercises.  PT answered Pt's questions.  ?Person educated: Patient ?Education method: Explanation ?Education comprehension:  verbalized understanding and needs further education ? ? ?HOME EXERCISE PROGRAM: ?Scap retraction, upper trap & levator stretch ? ?ASSESSMENT: ? ?CLINICAL IMPRESSION: ?Limited tolerance to Dry Needling today so focused on more manual therapy. Rt side of rib cage is notably elevated from a  post aspect and grossly anteriorly rotated. Lt side of rib cage is very stiff. Reviewed proper activation of periscap muscualture to decrease overuse. Is traveling over the weekend.  ? ?OBJECTIVE IMPAIRMENTS decreased activity tolerance, decreased endurance, decreased mobility, difficulty walking, decreased ROM, decreased strength, hypomobility, increased fascial restrictions, impaired flexibility, and pain.  ? ?ACTIVITY LIMITATIONS cleaning, community activity, meal prep, shopping, and yard work.  ? ?PERSONAL FACTORS Time since onset of injury/illness/exacerbation and 1 comorbidity: osteopenia  are also affecting patient's functional outcome.  ? ? ?REHAB POTENTIAL: Good ? ?CLINICAL DECISION MAKING: Stable/uncomplicated ? ?EVALUATION COMPLEXITY: Low ? ? ?GOALS: ? ? ?SHORT TERM GOALS: Target date: 11/18/2021 ? ?Pt will be independent and compliant with HEP for improved pain, ROM, strength, and function.  ?Baseline: ?Goal status: INITIAL ? ?2.  Pt will demo improved R thoracic rotation AROM to be [facility] and 10 deg in bilat cervical Sb'ing AROM for improved mobility and stiffness. ?Baseline:  ?Goal status: INITIAL ? ?3.  Pt will report at least a 25% improvement in pain and sx's overall.  ?Baseline:  ?Goal status: INITIAL ? ? ? ?LONG TERM GOALS: Target date: 12/09/2021 ? ?Pt will be able to cut up vegetables, prepare food, and cook without significant pain.  ?Baseline:  ?Goal status: INITIAL ? ?2.  Pt will be able to ambulate extended community distance without significant pain or limitation. ?Baseline:  ?Goal status: INITIAL ? ?3.  Pt will report at least a 70% improvement in pain and sx's toward the end of the day.  ?Baseline:  ?Goal  status: INITIAL ? ?4.  Pt will report she is able to perform her normal standing activities including household chores without significant pain or difficulty.  ?Baseline:  ?Goal status: INITIAL ? ? ? ?PLAN: ?

## 2021-11-03 ENCOUNTER — Ambulatory Visit (HOSPITAL_BASED_OUTPATIENT_CLINIC_OR_DEPARTMENT_OTHER): Payer: PPO | Admitting: Physical Therapy

## 2021-11-04 ENCOUNTER — Encounter: Payer: Self-pay | Admitting: Family Medicine

## 2021-11-04 ENCOUNTER — Ambulatory Visit (INDEPENDENT_AMBULATORY_CARE_PROVIDER_SITE_OTHER): Payer: PPO | Admitting: Family Medicine

## 2021-11-04 ENCOUNTER — Ambulatory Visit (INDEPENDENT_AMBULATORY_CARE_PROVIDER_SITE_OTHER): Payer: PPO

## 2021-11-04 VITALS — BP 120/70 | HR 82 | Temp 97.6°F | Ht 67.0 in | Wt 218.1 lb

## 2021-11-04 DIAGNOSIS — Z0000 Encounter for general adult medical examination without abnormal findings: Secondary | ICD-10-CM

## 2021-11-04 DIAGNOSIS — H1132 Conjunctival hemorrhage, left eye: Secondary | ICD-10-CM

## 2021-11-04 DIAGNOSIS — F418 Other specified anxiety disorders: Secondary | ICD-10-CM | POA: Diagnosis not present

## 2021-11-04 NOTE — Patient Instructions (Addendum)
Please consider counseling. Contact [phone] to schedule an appointment or inquire about cost/insurance coverage. ? ?[facility] located at [address] [place], [place], [place].  Phone number = [phone].  Dr. Lennice Sites - Adult Psychiatry.  ?  ?[facility] located at [address] [place], [place]. Phone number = [phone]. ?  ?[facility] located at [address], [place], [place].  Phone number = [phone]. ?  ?[facility] located at [place], [place], [place].  Phone number = [phone]. ? ?This can take a few weeks to fully resolve.  ? ?Let us know if you need anything. ?

## 2021-11-04 NOTE — Progress Notes (Signed)
? ?Subjective:  ? Tonya [name] is a 71 y.o. female who presents for an Initial Medicare Annual Wellness Visit. ? ?I connected with  Tonya [name] on 11/04/21 by a audio enabled telemedicine application and verified that I am speaking with the correct person using two identifiers. ? ?Patient Location: Home ? ?Provider Location: Office/Clinic ? ?I discussed the limitations of evaluation and management by telemedicine. The patient expressed understanding and agreed to proceed.  ? ?Review of Systems    ? ?Cardiac Risk Factors include: advanced age (>72mn, >>94women);hypertension;dyslipidemia ? ?   ?Objective:  ?  ?Today's Vitals  ? 11/04/21 1347  ?PainSc: 4   ? ?There is no height or weight on file to calculate BMI. ? ? ?  11/04/2021  ?  1:46 PM 10/28/2021  ?  2:19 PM 02/21/2020  ?  9:31 AM  ?Advanced Directives  ?Does Patient Have a Medical Advance Directive? Yes Yes Yes  ?Type of [place] (pink MOST or yellow form);Living will [place]   ?Does patient want to make changes to medical advance directive? No - Patient declined  No - Patient declined  ?Copy of [place] Chart? Yes - validated most recent copy scanned in chart (See row information)    ? ? ?Current Medications (verified) ?Outpatient Encounter Medications as of 11/04/2021  ?Medication Sig  ? aspirin EC 81 MG tablet Take 1 tablet (81 mg total) by mouth daily. Swallow whole.  ? atenolol (TENORMIN) 50 MG tablet   ? calcium-vitamin D (OSCAL WITH D) 250-125 MG-UNIT tablet Take 1 tablet by mouth daily.  ? cetirizine (ZYRTEC) 10 MG tablet Take 5 mg by mouth daily.  ? ketoconazole (NIZORAL) 2 % cream Apply 1 application. topically daily.  ? losartan (COZAAR) 50 MG tablet TAKE 1 TABLET BY MOUTH EVERY DAY  ? metroNIDAZOLE (METROGEL) 0.75 % vaginal gel Place 1 Applicatorful vaginally at bedtime. Apply one applicatorful to vagina at bedtime for 10 days, then  twice a week for 6 months.  ? montelukast (SINGULAIR) 10 MG tablet TAKE 1 TABLET BY MOUTH EVERYDAY AT BEDTIME  ? pantoprazole (PROTONIX) 20 MG tablet Take 1 tablet (20 mg total) by mouth daily.  ? pravastatin (PRAVACHOL) 40 MG tablet Take 1 tablet (40 mg total) by mouth every evening.  ? ?No facility-administered encounter medications on file as of 11/04/2021.  ? ? ?Allergies (verified) ?Crestor [rosuvastatin] and Lipitor [atorvastatin]  ? ?History: ?Past Medical History:  ?Diagnosis Date  ? Coronary atherosclerosis due to calcified coronary lesion   ? History of breast cancer   ? Hyperlipidemia   ? Hypertension   ? Osteopenia   ? ?Past Surgical History:  ?Procedure Laterality Date  ? ABDOMINAL HYSTERECTOMY  2015  ? AUGMENTATION MAMMAPLASTY Left   ? BREAST BIOPSY Right 02/2019  ? BREAST IMPLANT EXCHANGE    ? FOOT SURGERY Left 05/25/2020  ? MASTECTOMY Left   ? REDU[place] MAMMAPLASTY Right   [place] ? ?Family History  ?Problem Relation Age of Onset  ? Heart attack Mother   ? Heart attack Father   ?     died from  ? Lung disease Brother   ? Cancer Brother   ? Breast cancer Maternal Aunt   ? Breast cancer Paternal Aunt   ? ?Social History  ? ?Socioeconomic History  ? Marital status: Married  ?  Spouse name: Not on file  ?  Number of children: Not on file  ? Years of education: Not on file  ? Highest education level: Not on file  ?Occupational History  ? Not on file  ?Tobacco Use  ? Smoking status: Never  ? Smokeless tobacco: Never  ?Vaping Use  ? Vaping Use: Never used  ?Substance and Sexual Activity  ? Alcohol use: Yes  ?  Comment: rarely  ? Drug use: Never  ? Sexual activity: Yes  ?  Birth control/protection: None  ?Other Topics Concern  ? Not on file  ?Social History Narrative  ? Not on file  ? ?Social Determinants of Health  ? ?Financial Resource Strain: Not on file  ?Food Insecurity: Not on file  ?Transportation Needs: Not on file  ?Physical Activity: Not on file  ?Stress: Not on file   ?Social Connections: Not on file  ? ? ?Tobacco Counseling ?Counseling given: Not Answered ? ? ?Clinical Intake: ? ?Pre-visit preparation completed: Yes ? ?Pain : 0-10 ?Pain Score: 4  ?Pain Type: Chronic pain ?Pain Descriptors / Indicators: Aching, Dull, Sore ?Pain Onset: More than a month ago ?Pain Frequency: Intermittent ? ?  ? ?Nutritional Risks: None ?Diabetes: No ? ?How often do you need to have someone help you when you read instructions, pamphlets, or other written materials from your doctor or pharmacy?: 1 - Never ? ?Diabetic?No ? ?Interpreter Needed?: No ? ?Information entered by :: Zahava Quant ? ? ?Activities of Daily Living ? ?  11/04/2021  ?  1:54 PM 02/14/2021  ?  1:27 PM  ?In your present state of health, do you have any difficulty performing the following activities:  ?Hearing? 0 0  ?Vision? 0 0  ?Difficulty concentrating or making decisions? 0 0  ?Walking or climbing stairs? 1 0  ?Dressing or bathing? 0 0  ?Doing errands, shopping? 0 0  ?Preparing Food and eating ? N   ?Using the Toilet? N   ?In the past six months, have you accidently leaked urine? N   ?Do you have problems with loss of bowel control? N   ?Managing your Medications? N   ?Managing your Finances? N   ?Housekeeping or managing your Housekeeping? N   ? ? ?Patient Care Team: ?[name], DO as PCP - General (Family Medicine) ? ?Indicate any recent Medical Services you may have received from other than [facility] providers in the past year (date may be approximate). ? ?   ?Assessment:  ? This is a routine wellness examination for Tonya [name]. ? ?Hearing/Vision screen ?No results found. ? ?Dietary issues and exercise activities discussed: ?Current Exercise Habits: The patient does not participate in regular exercise at present, Exercise limited by: None identified ? ? Goals Addressed   ?None ?  ? ?Depression Screen ? ?  11/04/2021  ?  1:47 PM 12/25/2020  ? 10:32 AM 09/20/2020  ? 10:39 AM  ?PHQ 2/9 Scores  ?PHQ - 2 Score 0 1 0  ?  ?Fall  Risk ? ?  11/04/2021  ?  1:47 PM 12/25/2020  ? 10:32 AM 09/20/2020  ? 10:52 AM  ?Fall Risk   ?Falls in the past year? 0 0 0  ?Number falls in past yr: 0 0 0  ?Injury with Fall? 0 0 0  ?Risk for fall due to : No Fall Risks    ?Follow up Falls evaluation completed Falls evaluation completed   ? ? ?FALL RISK PREVENTION PERTAINING TO THE HOME: ? ?Any stairs in or around the home? Yes  ?If so, are  there any without handrails? No  ?Home free of loose throw rugs in walkways, pet beds, electrical cords, etc? Yes  ?Adequate lighting in your home to reduce risk of falls? Yes  ? ?ASSISTIVE DEVICES UTILIZED TO PREVENT FALLS: ? ?Life alert? No  ?Use of a cane, walker or w/c? No  ?Grab bars in the bathroom? No  ?Shower chair or bench in shower? Yes  ?Elevated toilet seat or a handicapped toilet? Yes ? ?TIMED UP AND GO: ? ?Was the test performed? No .  ? ? ?Cognitive Function: ?  ?  ? ?  11/04/2021  ?  2:06 PM  ?6CIT Screen  ?What Year? 0 points  ?What month? 0 points  ?What time? 0 points  ?Count back from 20 0 points  ?Months in reverse 0 points  ?Repeat phrase 0 points  ?Total Score 0 points  ? ? ?Immunizations ?Immunization History  ?Administered Date(s) Administered  ? Fluad Quad(high Dose 65+) 04/13/2019  ? Influenza,inj,Quad PF,6+ Mos 04/14/2018  ? PFIZER(Purple Top)SARS-COV-2 Vaccination 07/20/2019, 08/15/2019, 03/26/2020  ? Pneumococcal Conjugate-13 04/14/2018  ? Pneumococcal Polysaccharide-23 09/19/2019  ? Tdap 10/24/2008, 10/24/2008, 10/20/2016  ? ? ?TDAP status: Up to date ? ?Flu Vaccine status: Due, Education has been provided regarding the importance of this vaccine. Advised may receive this vaccine at local pharmacy or Health Dept. Aware to provide a copy of the vaccination record if obtained from local pharmacy or Health Dept. Verbalized acceptance and understanding. ? ?Pneumococcal vaccine status: Up to date ? ?Covid-19 vaccine status: Information provided on how to obtain vaccines.  ? ?Qualifies for Shingles Vaccine?  Yes   ?Zostavax completed No   ?Shingrix Completed?: No.    Education has been provided regarding the importance of this vaccine. Patient has been advised to call insurance company to determine out of pocket

## 2021-11-04 NOTE — Progress Notes (Signed)
Chief Complaint  ?Patient presents with  ? Eye Problem  ?  Left eye   ? ? ?Subjective: ?Patient is a 71 y.o. female here for eye issue. ? ?1 d ago, woke up w blood under L eye. Was screaming at husband over weekend. She is also on a blood thinner. No eye pain, drainage, itching, pain, trouble moving eye.  ? ?She has been going thru marital strife. She has gone thru couples counseling in the past that her husband refuses to go back.  She went to [place] with him recently and they saw his brother.  She feels her brother-in-law disrespected her right in front of her husband and nothing was done.  Subsequently, her husband also started to verbally distress better in a similar fashion.  She feels she is doing many things to accommodate her husband with little reciprocation.  It is quite frustrating.  She is requesting counseling information. ? ?Past Medical History:  ?Diagnosis Date  ? Coronary atherosclerosis due to calcified coronary lesion   ? History of breast cancer   ? Hyperlipidemia   ? Hypertension   ? Osteopenia   ? ? ?Objective: ?BP 120/70   Pulse 82   Temp 97.6 ?F (36.4 ?C) (Oral)   Ht '5\' 7"'$  (1.702 m)   Wt 218 lb 2 oz (98.9 kg)   SpO2 98%   BMI 34.16 kg/m?  ?General: Awake, appears stated age ?Eyes: Sclera slightly with icterus and a lateral and superior area of hemorrhage appreciated; there is no tenderness to palpation over light palpation of the globes suggesting increasing pressure; external ocular muscles intact, PERRLA ?Lungs: No accessory muscle use ?Psych: Age appropriate judgment and insight, normal affect and mood ? ?Assessment and Plan: ?Subconjunctival bleed, left ? ?Situational anxiety ? ?Reassurance.  She will let me know if anything changes.  If she starts having increasing pain or vision changes, she will seek immediate care. ?I did provide her with counseling information today.  We had a long discussion regarding her experience this past weekend.  Her husband is also a patient here  and I obviously do not see such behavior to the extent that she does.  I will see her as originally scheduled or as needed. ?The patient voiced understanding and agreement to the plan. ? ?I spent 38 minutes with the patient discussing the above plan in addition to extensively discussing her marital strife and reviewing her chart on the same day of the visit. ? ?[name], DO ?11/04/21  ?4:40 PM ? ? ? ? ?

## 2021-11-04 NOTE — Patient Instructions (Signed)
Tonya [name] , ?Thank you for taking time to come for your Medicare Wellness Visit. I appreciate your ongoing commitment to your health goals. Please review the following plan we discussed and let me know if I can assist you in the future.  ? ?Screening recommendations/referrals: ?Colonoscopy: 03/22/17 due 03/23/27 ?Mammogram: 07/04/21 due 07/04/22 ?Bone Density: declined ?Recommended yearly ophthalmology/optometry visit for glaucoma screening and checkup ?Recommended yearly dental visit for hygiene and checkup ? ?Vaccinations: ?Influenza vaccine: Due-May obtain vaccine at our office or your local pharmacy.  ?Pneumococcal vaccine: up to date ?Tdap vaccine: up to date ?Shingles vaccine: Due-May obtain vaccine at our office or your local pharmacy.    ?Covid-19:Due-May obtain vaccine at our office or your local pharmacy.  ? ?Advanced directives: yes, not on file ? ?Conditions/risks identified: see problem list  ? ?Next appointment: Follow up in one year for your annual wellness visit  ? ? ?Preventive Care 70 Years and Older, Female ?Preventive care refers to lifestyle choices and visits with your health care provider that can promote health and wellness. ?What does preventive care include? ?A yearly physical exam. This is also called an annual well check. ?Dental exams once or twice a year. ?Routine eye exams. Ask your health care provider how often you should have your eyes checked. ?Personal lifestyle choices, including: ?Daily care of your teeth and gums. ?Regular physical activity. ?Eating a healthy diet. ?Avoiding tobacco and drug use. ?Limiting alcohol use. ?Practicing safe sex. ?Taking low-dose aspirin every day. ?Taking vitamin and mineral supplements as recommended by your health care provider. ?What happens during an annual well check? ?The services and screenings done by your health care provider during your annual well check will depend on your age, overall health, lifestyle risk factors, and family history of  disease. ?Counseling  ?Your health care provider may ask you questions about your: ?Alcohol use. ?Tobacco use. ?Drug use. ?Emotional well-being. ?Home and relationship well-being. ?Sexual activity. ?Eating habits. ?History of falls. ?Memory and ability to understand (cognition). ?Work and work Statistician. ?Reproductive health. ?Screening  ?You may have the following tests or measurements: ?Height, weight, and BMI. ?Blood pressure. ?Lipid and cholesterol levels. These may be checked every 5 years, or more frequently if you are over 70 years old. ?Skin check. ?Lung cancer screening. You may have this screening every year starting at age 70 if you have a 30-pack-year history of smoking and currently smoke or have quit within the past 15 years. ?Fecal occult blood test (FOBT) of the stool. You may have this test every year starting at age 70. ?Flexible sigmoidoscopy or colonoscopy. You may have a sigmoidoscopy every 5 years or a colonoscopy every 10 years starting at age 70. ?Hepatitis C blood test. ?Hepatitis B blood test. ?Sexually transmitted disease (STD) testing. ?Diabetes screening. This is done by checking your blood sugar (glucose) after you have not eaten for a while (fasting). You may have this done every 1-3 years. ?Bone density scan. This is done to screen for osteoporosis. You may have this done starting at age 70. ?Mammogram. This may be done every 1-2 years. Talk to your health care provider about how often you should have regular mammograms. ?Talk with your health care provider about your test results, treatment options, and if necessary, the need for more tests. ?Vaccines  ?Your health care provider may recommend certain vaccines, such as: ?Influenza vaccine. This is recommended every year. ?Tetanus, diphtheria, and acellular pertussis (Tdap, Td) vaccine. You may need a Td booster every 10 years. ?  Zoster vaccine. You may need this after age 70. ?Pneumococcal 13-valent conjugate (PCV13) vaccine. One  dose is recommended after age 70. ?Pneumococcal polysaccharide (PPSV23) vaccine. One dose is recommended after age 70. ?Talk to your health care provider about which screenings and vaccines you need and how often you need them. ?This information is not intended to replace advice given to you by your health care provider. Make sure you discuss any questions you have with your health care provider. ?Document Released: 07/05/2015 Document Revised: 02/26/2016 Document Reviewed: 04/09/2015 ?Elsevier Interactive Patient Education ? 2017 [employer]. ? ?Fall Prevention in the Home ?Falls can cause injuries. They can happen to people of all ages. There are many things you can do to make your home safe and to help prevent falls. ?What can I do on the outside of my home? ?Regularly fix the edges of walkways and driveways and fix any cracks. ?Remove anything that might make you trip as you walk through a door, such as a raised step or threshold. ?Trim any bushes or trees on the path to your home. ?Use bright outdoor lighting. ?Clear any walking paths of anything that might make someone trip, such as rocks or tools. ?Regularly check to see if handrails are loose or broken. Make sure that both sides of any steps have handrails. ?Any raised decks and porches should have guardrails on the edges. ?Have any leaves, snow, or ice cleared regularly. ?Use sand or salt on walking paths during winter. ?Clean up any spills in your garage right away. This includes oil or grease spills. ?What can I do in the bathroom? ?Use night lights. ?Install grab bars by the toilet and in the tub and shower. Do not use towel bars as grab bars. ?Use non-skid mats or decals in the tub or shower. ?If you need to sit down in the shower, use a plastic, non-slip stool. ?Keep the floor dry. Clean up any water that spills on the floor as soon as it happens. ?Remove soap buildup in the tub or shower regularly. ?Attach bath mats securely with double-sided  non-slip rug tape. ?Do not have throw rugs and other things on the floor that can make you trip. ?What can I do in the bedroom? ?Use night lights. ?Make sure that you have a light by your bed that is easy to reach. ?Do not use any sheets or blankets that are too big for your bed. They should not hang down onto the floor. ?Have a firm chair that has side arms. You can use this for support while you get dressed. ?Do not have throw rugs and other things on the floor that can make you trip. ?What can I do in the kitchen? ?Clean up any spills right away. ?Avoid walking on wet floors. ?Keep items that you use a lot in easy-to-reach places. ?If you need to reach something above you, use a strong step stool that has a grab bar. ?Keep electrical cords out of the way. ?Do not use floor polish or wax that makes floors slippery. If you must use wax, use non-skid floor wax. ?Do not have throw rugs and other things on the floor that can make you trip. ?What can I do with my stairs? ?Do not leave any items on the stairs. ?Make sure that there are handrails on both sides of the stairs and use them. Fix handrails that are broken or loose. Make sure that handrails are as long as the stairways. ?Check any carpeting to make sure that  it is firmly attached to the stairs. Fix any carpet that is loose or worn. ?Avoid having throw rugs at the top or bottom of the stairs. If you do have throw rugs, attach them to the floor with carpet tape. ?Make sure that you have a light switch at the top of the stairs and the bottom of the stairs. If you do not have them, ask someone to add them for you. ?What else can I do to help prevent falls? ?Wear shoes that: ?Do not have high heels. ?Have rubber bottoms. ?Are comfortable and fit you well. ?Are closed at the toe. Do not wear sandals. ?If you use a stepladder: ?Make sure that it is fully opened. Do not climb a closed stepladder. ?Make sure that both sides of the stepladder are locked into place. ?Ask  someone to hold it for you, if possible. ?Clearly mark and make sure that you can see: ?Any grab bars or handrails. ?First and last steps. ?Where the edge of each step is. ?Use tools that help you move around (

## 2021-11-05 ENCOUNTER — Other Ambulatory Visit: Payer: Self-pay | Admitting: Family Medicine

## 2021-11-05 ENCOUNTER — Encounter: Payer: Self-pay | Admitting: Family Medicine

## 2021-11-05 DIAGNOSIS — F418 Other specified anxiety disorders: Secondary | ICD-10-CM

## 2021-11-05 NOTE — Therapy (Signed)
OUTPATIENT PHYSICAL THERAPY TREATMENT NOTE   Patient Name: Tonya [name] MRN: [id number] DOB:01-Jan-1951, 71 y.o., female Today's Date: 11/07/2021   PT End of Session - 11/06/21 1517     Visit Number 3    Number of Visits 12    Date for PT Re-Evaluation 12/09/21    Authorization Type HTA    PT Start Time 4098    PT Stop Time 1521    PT Time Calculation (min) 40 min    Activity Tolerance Patient tolerated treatment well    Behavior During Therapy WFL for tasks assessed/performed               Past Medical History:  Diagnosis Date   Coronary atherosclerosis due to calcified coronary lesion    History of breast cancer    Hyperlipidemia    Hypertension    Osteopenia    Past Surgical History:  Procedure Laterality Date   ABDOMINAL HYSTERECTOMY  2015   AUGMENTATION MAMMAPLASTY Left    BREAST BIOPSY Right 02/2019   BREAST IMPLANT EXCHANGE     FOOT SURGERY Left 05/25/2020   MASTECTOMY Left    REDUCTION MAMMAPLASTY Right    TONSILLECTOMY AND ADENOIDECTOMY  1958   Patient Active Problem List   Diagnosis Date Noted   Coronary artery disease due to lipid rich plaque 09/30/2021   Hepatic steatosis 09/30/2021   Adenoma of left adrenal gland 09/30/2021   Strain of left trapezius muscle 09/30/2021   Hyperlipidemia 05/19/2021   Somatic dysfunction of spine, thoracic 03/26/2021   Chronic left-sided thoracic back pain 12/30/2020   DCIS (ductal carcinoma in situ) 01/26/2020   Low back pain 01/26/2020   Chronic pain of both knees 09/19/2019   Osteopenia    Estrogen deficiency 04/14/2018   Essential hypertension 01/10/2018   Gastroesophageal reflux disease 01/10/2018   Mild intermittent asthma without complication 11/91/4782   Pain in left lower leg 03/03/2017   Family history of colon cancer 02/23/2017   Herpes simplex vulvovaginitis 02/18/2017   Neuropathy 02/18/2017   Elevated liver enzymes 02/17/2017   Need for Streptococcus pneumoniae vaccination 02/17/2017    Chondromalacia of left patella 12/28/2016   Synovitis of left knee 12/28/2016   Seasonal allergic rhinitis due to fungal spores 09/25/2016   Bone mass 09/24/2016   Chronic hip pain 09/24/2016   Dark yellow-colored urine 09/24/2016   Low oxygen saturation 09/24/2016   Other specified postprocedural states 01/17/2015   Abnormal stress electrocardiogram test 10/20/2013   Malignant neoplasm of female breast ([place]) 12/15/2010   Breast mass 12/11/2010   Family history of malignant neoplasm of breast 12/11/2010    PCP: [name], DO  REFERRING PROVIDER: Lyndal Pulley, DO   REFERRING DIAG: [phone] (ICD-10-CM) - Chronic bilateral low back pain without sciatica  THERAPY DIAG:  Pain in thoracic spine  Cervicalgia  Joint stiffness in cervical  ONSET DATE: 10/09/2021 MD order  SUBJECTIVE:  SUBJECTIVE STATEMENT: Pt reports she felt much better after prior Rx.  Pt states the dry needling significantly.  "I've been suffering for 1.5 years.  The pain is not gone away but feels much better".  Pt is having much less pain in her side.  Pt is performing her stretches/HEP.    PERTINENT HISTORY:  CAD, Hx of Breast CA (in remission for 12-13 years), Osteopenia  PAIN:  PAIN:  Are you having pain? Yes: NPRS scale: 4-5/10 Pain location:  Lt lower cervical/UT and medial scap region Pain description: achey Aggravating factors: night pain Relieving factors: laying down    PRECAUTIONS: Other: osteopenia  WEIGHT BEARING RESTRICTIONS No  FALLS:  Has patient fallen in last 6 months? No   OCCUPATION: Pt is retired  PLOF: Independent; Pt was able to perform ADLs/IADLs and mobility with less pain.   PATIENT GOALS to be pain free in cervical and thoracic, increase walking without pain, to be  able cook with less pain, to be able to get on the floor and play with grandchildren   OBJECTIVE:   DIAGNOSTIC FINDINGS:  Thoracic MRI IMPRESSION: 1. No acute osseous abnormality of the thoracic spine. 2. Mild multilevel thoracic spondylosis with scattered noncompressive disc protrusions. No foraminal or canal stenosis at any level.   TODAY'S TREATMENT  Therapeutic Exercise: Reviewed response to prior Rx, HEP compliance, pain level, and current function. Reviewed HEP. Pt performed:  Scap retraction x12 reps with 3 sec hold  Standing rows with scap retraction with RTB x 10 reps  Standing shoulder extension with scap retraction with RTB x 10 reps  Seated UT stretch 2x20-30 sec bilat  Seated LS stretch 2x20-30 sec bilat  Supine serratus punch 2x10 reps each side  Prone extension x10 reps each side  Prone horizontal abd x 10 reps each side    MANUAL STM to Lt upper trap and medial scap in sitting    PATIENT EDUCATION:  Education details: Dx, POC, relevant anatomy, objective findings, and rationale of exercises.  PT answered Pt's questions.  Person educated: Patient Education method: Explanation Education comprehension: verbalized understanding and needs further education   HOME EXERCISE PROGRAM: Scap retraction, upper trap & levator stretch  ASSESSMENT:  CLINICAL IMPRESSION: Pt presents to Rx stating she felt significantly better after dry needling.  PT reviewed her HEP and instructed her in correct form.  PT added scapular and postural strengthening today.  She had more difficulty with L UE prone ex's than R UE.  Pt does have tightness in UT.  She responded well to Rx reporting improved pain and stating she felt better after Rx.  She should benefit from cont skilled PT services to address impairments and goals and improve overall function.     OBJECTIVE IMPAIRMENTS decreased activity tolerance, decreased endurance, decreased mobility, difficulty walking, decreased ROM,  decreased strength, hypomobility, increased fascial restrictions, impaired flexibility, and pain.   ACTIVITY LIMITATIONS cleaning, community activity, meal prep, shopping, and yard work.   PERSONAL FACTORS Time since onset of injury/illness/exacerbation and 1 comorbidity: osteopenia  are also affecting patient's functional outcome.    REHAB POTENTIAL: Good  CLINICAL DECISION MAKING: Stable/uncomplicated  EVALUATION COMPLEXITY: Low   GOALS:   SHORT TERM GOALS: Target date: 11/18/2021  Pt will be independent and compliant with HEP for improved pain, ROM, strength, and function.  Baseline: Goal status: INITIAL  2.  Pt will demo improved R thoracic rotation AROM to be [facility] and 10 deg in bilat cervical Sb'ing AROM for improved mobility and  stiffness. Baseline:  Goal status: INITIAL  3.  Pt will report at least a 25% improvement in pain and sx's overall.  Baseline:  Goal status: INITIAL    LONG TERM GOALS: Target date: 12/09/2021  Pt will be able to cut up vegetables, prepare food, and cook without significant pain.  Baseline:  Goal status: INITIAL  2.  Pt will be able to ambulate extended community distance without significant pain or limitation. Baseline:  Goal status: INITIAL  3.  Pt will report at least a 70% improvement in pain and sx's toward the end of the day.  Baseline:  Goal status: INITIAL  4.  Pt will report she is able to perform her normal standing activities including household chores without significant pain or difficulty.  Baseline:  Goal status: INITIAL    PLAN: PT FREQUENCY: 1-2x/week  PT DURATION: 6 weeks  PLANNED INTERVENTIONS: Therapeutic exercises, Therapeutic activity, Neuromuscular re-education, Balance training, Gait training, Patient/Family education, Joint mobilization, Stair training, Aquatic Therapy, Dry Needling, Electrical stimulation, Cryotherapy, Moist heat, Ultrasound, and Manual therapy.  PLAN FOR NEXT SESSION: continue DN PRN,  STM, cont Lt scap mobility, prone periscap strengthening  Selinda Michaels III PT, DPT 11/07/21 5:09 PM

## 2021-11-06 ENCOUNTER — Ambulatory Visit (HOSPITAL_BASED_OUTPATIENT_CLINIC_OR_DEPARTMENT_OTHER): Payer: PPO | Admitting: Physical Therapy

## 2021-11-06 DIAGNOSIS — M545 Low back pain, unspecified: Secondary | ICD-10-CM | POA: Diagnosis not present

## 2021-11-06 DIAGNOSIS — M542 Cervicalgia: Secondary | ICD-10-CM

## 2021-11-06 DIAGNOSIS — M546 Pain in thoracic spine: Secondary | ICD-10-CM

## 2021-11-06 NOTE — Telephone Encounter (Signed)
Pt states [facility] has not received referral and would like it resent to f: [phone].

## 2021-11-07 ENCOUNTER — Encounter (HOSPITAL_BASED_OUTPATIENT_CLINIC_OR_DEPARTMENT_OTHER): Payer: Self-pay | Admitting: Physical Therapy

## 2021-11-11 ENCOUNTER — Ambulatory Visit (HOSPITAL_BASED_OUTPATIENT_CLINIC_OR_DEPARTMENT_OTHER): Payer: PPO | Admitting: Physical Therapy

## 2021-11-11 ENCOUNTER — Encounter (HOSPITAL_BASED_OUTPATIENT_CLINIC_OR_DEPARTMENT_OTHER): Payer: Self-pay | Admitting: Physical Therapy

## 2021-11-11 DIAGNOSIS — M542 Cervicalgia: Secondary | ICD-10-CM

## 2021-11-11 DIAGNOSIS — M545 Low back pain, unspecified: Secondary | ICD-10-CM | POA: Diagnosis not present

## 2021-11-11 DIAGNOSIS — M546 Pain in thoracic spine: Secondary | ICD-10-CM

## 2021-11-11 NOTE — Therapy (Signed)
OUTPATIENT PHYSICAL THERAPY TREATMENT NOTE   Patient Name: Tonya [name] MRN: [id number] DOB:April 07, 1951, 71 y.o., female Today's Date: 11/11/2021   PT End of Session - 11/11/21 1345     Visit Number 4    Number of Visits 12    Date for PT Re-Evaluation 12/09/21    Authorization Type HTA    PT Start Time 1345    PT Stop Time 1428    PT Time Calculation (min) 43 min    Activity Tolerance Patient tolerated treatment well    Behavior During Therapy WFL for tasks assessed/performed               Past Medical History:  Diagnosis Date   Coronary atherosclerosis due to calcified coronary lesion    History of breast cancer    Hyperlipidemia    Hypertension    Osteopenia    Past Surgical History:  Procedure Laterality Date   ABDOMINAL HYSTERECTOMY  2015   AUGMENTATION MAMMAPLASTY Left    BREAST BIOPSY Right 02/2019   BREAST IMPLANT EXCHANGE     FOOT SURGERY Left 05/25/2020   MASTECTOMY Left    REDUCTION MAMMAPLASTY Right    TONSILLECTOMY AND ADENOIDECTOMY  1958   Patient Active Problem List   Diagnosis Date Noted   Coronary artery disease due to lipid rich plaque 09/30/2021   Hepatic steatosis 09/30/2021   Adenoma of left adrenal gland 09/30/2021   Strain of left trapezius muscle 09/30/2021   Hyperlipidemia 05/19/2021   Somatic dysfunction of spine, thoracic 03/26/2021   Chronic left-sided thoracic back pain 12/30/2020   DCIS (ductal carcinoma in situ) 01/26/2020   Low back pain 01/26/2020   Chronic pain of both knees 09/19/2019   Osteopenia    Estrogen deficiency 04/14/2018   Essential hypertension 01/10/2018   Gastroesophageal reflux disease 01/10/2018   Mild intermittent asthma without complication 31/49/7026   Pain in left lower leg 03/03/2017   Family history of colon cancer 02/23/2017   Herpes simplex vulvovaginitis 02/18/2017   Neuropathy 02/18/2017   Elevated liver enzymes 02/17/2017   Need for Streptococcus pneumoniae vaccination 02/17/2017    Chondromalacia of left patella 12/28/2016   Synovitis of left knee 12/28/2016   Seasonal allergic rhinitis due to fungal spores 09/25/2016   Bone mass 09/24/2016   Chronic hip pain 09/24/2016   Dark yellow-colored urine 09/24/2016   Low oxygen saturation 09/24/2016   Other specified postprocedural states 01/17/2015   Abnormal stress electrocardiogram test 10/20/2013   Malignant neoplasm of female breast ([place]) 12/15/2010   Breast mass 12/11/2010   Family history of malignant neoplasm of breast 12/11/2010    PCP: [name], DO  REFERRING PROVIDER: Lyndal Pulley, DO   REFERRING DIAG: [phone] (ICD-10-CM) - Chronic bilateral low back pain without sciatica  THERAPY DIAG:  Pain in thoracic spine  Cervicalgia  Joint stiffness in cervical  ONSET DATE: 10/09/2021 MD order  SUBJECTIVE:  SUBJECTIVE STATEMENT: Relief lasted about a week after DN. Only 1 night where I felt lower neck and into shoulder blade and needed heating pad.   PERTINENT HISTORY:  CAD, Hx of Breast CA (in remission for 12-13 years), Osteopenia  PAIN:  PAIN:  Are you having pain? Yes: NPRS scale: 4-5/10 Pain location:  Lt lower cervical/UT and medial scap region Pain description: achey Aggravating factors: night pain Relieving factors: laying down    PRECAUTIONS: Other: osteopenia  WEIGHT BEARING RESTRICTIONS No  FALLS:  Has patient fallen in last 6 months? No   OCCUPATION: Pt is retired  PLOF: Independent; Pt was able to perform ADLs/IADLs and mobility with less pain.   PATIENT GOALS to be pain free in cervical and thoracic, increase walking without pain, to be able cook with less pain, to be able to get on the floor and play with grandchildren   OBJECTIVE:   DIAGNOSTIC FINDINGS:  Thoracic  MRI IMPRESSION: 1. No acute osseous abnormality of the thoracic spine. 2. Mild multilevel thoracic spondylosis with scattered noncompressive disc protrusions. No foraminal or canal stenosis at any level.   TODAY'S TREATMENT  5/23: TPDN with skilled palpation and monitoring followed by STM to the following muscles: Lt upper trap, levator scap STM: subscap. Pec major/minor GHJ mobs in supine- AP, inferior at 90 abd Prone scap mobility Prone rib mobility Quick review of postural alingment in seated     PATIENT EDUCATION:  Education details: Dx, POC, relevant anatomy, objective findings, and rationale of exercises.  PT answered Pt's questions.  Person educated: Patient Education method: Explanation Education comprehension: verbalized understanding and needs further education   HOME EXERCISE PROGRAM: Scap retraction, upper trap & levator stretch  ASSESSMENT:  CLINICAL IMPRESSION: Twitch response in upper trap with improved tolerance to needling. Would benefit from DN to pecs and subscap. Will cont to work on postural alignment and periscap activation.    OBJECTIVE IMPAIRMENTS decreased activity tolerance, decreased endurance, decreased mobility, difficulty walking, decreased ROM, decreased strength, hypomobility, increased fascial restrictions, impaired flexibility, and pain.   ACTIVITY LIMITATIONS cleaning, community activity, meal prep, shopping, and yard work.   PERSONAL FACTORS Time since onset of injury/illness/exacerbation and 1 comorbidity: osteopenia  are also affecting patient's functional outcome.    REHAB POTENTIAL: Good  CLINICAL DECISION MAKING: Stable/uncomplicated  EVALUATION COMPLEXITY: Low   GOALS:   SHORT TERM GOALS: Target date: 11/18/2021  Pt will be independent and compliant with HEP for improved pain, ROM, strength, and function.  Baseline: Goal status: INITIAL  2.  Pt will demo improved R thoracic rotation AROM to be [facility] and 10 deg in bilat  cervical Sb'ing AROM for improved mobility and stiffness. Baseline: cervical SB 22 deg bil Goal status:achieved   3.  Pt will report at least a 25% improvement in pain and sx's overall.  Baseline:  Goal status: achieved    LONG TERM GOALS: Target date: 12/09/2021  Pt will be able to cut up vegetables, prepare food, and cook without significant pain.  Baseline:  Goal status: INITIAL  2.  Pt will be able to ambulate extended community distance without significant pain or limitation. Baseline:  Goal status: INITIAL  3.  Pt will report at least a 70% improvement in pain and sx's toward the end of the day.  Baseline:  Goal status: INITIAL  4.  Pt will report she is able to perform her normal standing activities including household chores without significant pain or difficulty.  Baseline:  Goal status: INITIAL  PLAN: PT FREQUENCY: 1-2x/week  PT DURATION: 6 weeks  PLANNED INTERVENTIONS: Therapeutic exercises, Therapeutic activity, Neuromuscular re-education, Balance training, Gait training, Patient/Family education, Joint mobilization, Stair training, Aquatic Therapy, Dry Needling, Electrical stimulation, Cryotherapy, Moist heat, Ultrasound, and Manual therapy.  PLAN FOR NEXT SESSION: continue DN PRN, STM, cont Lt scap mobility, supine foam roll exercises for thoracic opening  Yariana Hoaglund C. Aneesah Hernan PT, DPT 11/11/21 2:30 PM

## 2021-11-13 ENCOUNTER — Ambulatory Visit (HOSPITAL_BASED_OUTPATIENT_CLINIC_OR_DEPARTMENT_OTHER): Payer: PPO | Admitting: Physical Therapy

## 2021-11-13 ENCOUNTER — Encounter (HOSPITAL_BASED_OUTPATIENT_CLINIC_OR_DEPARTMENT_OTHER): Payer: Self-pay | Admitting: Physical Therapy

## 2021-11-13 DIAGNOSIS — M542 Cervicalgia: Secondary | ICD-10-CM

## 2021-11-13 DIAGNOSIS — M545 Low back pain, unspecified: Secondary | ICD-10-CM | POA: Diagnosis not present

## 2021-11-13 DIAGNOSIS — M546 Pain in thoracic spine: Secondary | ICD-10-CM

## 2021-11-13 NOTE — Therapy (Signed)
OUTPATIENT PHYSICAL THERAPY TREATMENT NOTE   Patient Name: Tonya [name] MRN: [id number] DOB:[birth year], 71 y.o., female Today's Date: 11/13/2021   PT End of Session - 11/13/21 0849     Visit Number 5    Number of Visits 12    Date for PT Re-Evaluation 12/09/21    Authorization Type HTA    PT Start Time 0806    PT Stop Time 0845    PT Time Calculation (min) 39 min    Activity Tolerance Patient tolerated treatment well    Behavior During Therapy WFL for tasks assessed/performed                Past Medical History:  Diagnosis Date   Coronary atherosclerosis due to calcified coronary lesion    History of breast cancer    Hyperlipidemia    Hypertension    Osteopenia    Past Surgical History:  Procedure Laterality Date   ABDOMINAL HYSTERECTOMY  2015   AUGMENTATION MAMMAPLASTY Left    BREAST BIOPSY Right 02/2019   BREAST IMPLANT EXCHANGE     FOOT SURGERY Left 05/25/2020   MASTECTOMY Left    REDUCTION MAMMAPLASTY Right    TONSILLECTOMY AND ADENOIDECTOMY  1958   Patient Active Problem List   Diagnosis Date Noted   Coronary artery disease due to lipid rich plaque 09/30/2021   Hepatic steatosis 09/30/2021   Adenoma of left adrenal gland 09/30/2021   Strain of left trapezius muscle 09/30/2021   Hyperlipidemia 05/19/2021   Somatic dysfunction of spine, thoracic 03/26/2021   Chronic left-sided thoracic back pain 12/30/2020   DCIS (ductal carcinoma in situ) 01/26/2020   Low back pain 01/26/2020   Chronic pain of both knees 09/19/2019   Osteopenia    Estrogen deficiency 04/14/2018   Essential hypertension 01/10/2018   Gastroesophageal reflux disease 01/10/2018   Mild intermittent asthma without complication 88/41/6606   Pain in left lower leg 03/03/2017   Family history of colon cancer 02/23/2017   Herpes simplex vulvovaginitis 02/18/2017   Neuropathy 02/18/2017   Elevated liver enzymes 02/17/2017   Need for Streptococcus pneumoniae vaccination 02/17/2017    Chondromalacia of left patella 12/28/2016   Synovitis of left knee 12/28/2016   Seasonal allergic rhinitis due to fungal spores 09/25/2016   Bone mass 09/24/2016   Chronic hip pain 09/24/2016   Dark yellow-colored urine 09/24/2016   Low oxygen saturation 09/24/2016   Other specified postprocedural states 01/17/2015   Abnormal stress electrocardiogram test 10/20/2013   Malignant neoplasm of female breast ([place]) 12/15/2010   Breast mass 12/11/2010   Family history of malignant neoplasm of breast 12/11/2010    PCP: [name], DO  REFERRING PROVIDER: Lyndal Pulley, DO   REFERRING DIAG: [phone] (ICD-10-CM) - Chronic bilateral low back pain without sciatica  THERAPY DIAG:  Pain in thoracic spine  Cervicalgia  Joint stiffness in cervical  ONSET DATE: 10/09/2021 MD order  SUBJECTIVE:  SUBJECTIVE STATEMENT: "It doesn't feel bad right now, but usually gets worse as the day goes on".  Pt states she didn't have as good of a response to prior Dry needling.  She reports having good relief for about 1 week after the 1st dry needling session though this time her pain returned the same evening of the dry needling Rx.  Pt states she had a super busy day yesterday and had increased pain.  Pt states she has increased pain with activities where she has her hands out in front of her including chopping vegetables and doing laundry.  She also has pain looking down.    PERTINENT HISTORY:  CAD, Hx of Breast CA (in remission for 12-13 years), Osteopenia  PAIN:  PAIN:  Are you having pain? Yes: NPRS scale: 3-4/10 Pain location:  mid thoracic pain Pain description: achey Aggravating factors: night pain Relieving factors: laying down    PRECAUTIONS: Other: osteopenia  WEIGHT BEARING RESTRICTIONS  No  FALLS:  Has patient fallen in last 6 months? No   OCCUPATION: Pt is retired  PLOF: Independent; Pt was able to perform ADLs/IADLs and mobility with less pain.   PATIENT GOALS to be pain free in cervical and thoracic, increase walking without pain, to be able cook with less pain, to be able to get on the floor and play with grandchildren   OBJECTIVE:   DIAGNOSTIC FINDINGS:  Thoracic MRI IMPRESSION: 1. No acute osseous abnormality of the thoracic spine. 2. Mild multilevel thoracic spondylosis with scattered noncompressive disc protrusions. No foraminal or canal stenosis at any level.   TODAY'S TREATMENT  Therapeutic Exercise: Therapeutic Exercise: Reviewed response to prior Rx, HEP compliance, pain level, and current function. Reviewed HEP. Pt performed:  Seated UT stretch 2x20-30 sec bilat            Seated LS stretch 2x20-30 sec bilat   Lying supine on foam roller x 3 mins            Standing rows with scap retraction with RTB 2x 10 reps            Standing shoulder extension with scap retraction with RTB 2x 10 reps See below for pt education                        MANUAL Therapy:   STM to Lt > R upper trap and medial scap in sitting to improve pain, tightness, and mobility    PATIENT EDUCATION:  Education details: POC, relevant anatomy, exercise form, and rationale of exercises.  PT answered Pt's questions.  Person educated: Patient Education method: Explanation Education comprehension: verbalized understanding and needs further education   HOME EXERCISE PROGRAM: Scap retraction, upper trap & levator stretch  ASSESSMENT:  CLINICAL IMPRESSION: Pt requires instruction in correct form with cervical stretches.  Pt states her back felt better with lying supine on foam roll.  She felt better during and after foam roll supine lying.  Pt has tightness in bilat UT L > R and reports improved sx's with STW.  Pt responded well to Rx reporting improved pain after Rx.   She should benefit from cont skilled PT services to address impairments and goals and to improve overall function.     OBJECTIVE IMPAIRMENTS decreased activity tolerance, decreased endurance, decreased mobility, difficulty walking, decreased ROM, decreased strength, hypomobility, increased fascial restrictions, impaired flexibility, and pain.   ACTIVITY LIMITATIONS cleaning, community activity, meal prep, shopping, and yard work.   PERSONAL  FACTORS Time since onset of injury/illness/exacerbation and 1 comorbidity: osteopenia  are also affecting patient's functional outcome.    REHAB POTENTIAL: Good  CLINICAL DECISION MAKING: Stable/uncomplicated  EVALUATION COMPLEXITY: Low   GOALS:   SHORT TERM GOALS: Target date: 11/18/2021  Pt will be independent and compliant with HEP for improved pain, ROM, strength, and function.  Baseline: Goal status: INITIAL  2.  Pt will demo improved R thoracic rotation AROM to be [facility] and 10 deg in bilat cervical Sb'ing AROM for improved mobility and stiffness. Baseline: cervical SB 22 deg bil Goal status:achieved   3.  Pt will report at least a 25% improvement in pain and sx's overall.  Baseline:  Goal status: achieved    LONG TERM GOALS: Target date: 12/09/2021  Pt will be able to cut up vegetables, prepare food, and cook without significant pain.  Baseline:  Goal status: INITIAL  2.  Pt will be able to ambulate extended community distance without significant pain or limitation. Baseline:  Goal status: INITIAL  3.  Pt will report at least a 70% improvement in pain and sx's toward the end of the day.  Baseline:  Goal status: INITIAL  4.  Pt will report she is able to perform her normal standing activities including household chores without significant pain or difficulty.  Baseline:  Goal status: INITIAL    PLAN: PT FREQUENCY: 1-2x/week  PT DURATION: 6 weeks  PLANNED INTERVENTIONS: Therapeutic exercises, Therapeutic activity,  Neuromuscular re-education, Balance training, Gait training, Patient/Family education, Joint mobilization, Stair training, Aquatic Therapy, Dry Needling, Electrical stimulation, Cryotherapy, Moist heat, Ultrasound, and Manual therapy.  PLAN FOR NEXT SESSION: continue DN PRN, STM, cont Lt scap mobility, supine foam roll exercises for thoracic opening.  Cont with postural/scapular strengthening.    Selinda Michaels III PT, DPT 11/13/21 10:50 PM

## 2021-11-14 ENCOUNTER — Ambulatory Visit: Payer: PPO | Admitting: Cardiology

## 2021-11-18 ENCOUNTER — Encounter (HOSPITAL_BASED_OUTPATIENT_CLINIC_OR_DEPARTMENT_OTHER): Payer: Self-pay | Admitting: Physical Therapy

## 2021-11-18 ENCOUNTER — Ambulatory Visit (HOSPITAL_BASED_OUTPATIENT_CLINIC_OR_DEPARTMENT_OTHER): Payer: PPO | Admitting: Physical Therapy

## 2021-11-18 DIAGNOSIS — M545 Low back pain, unspecified: Secondary | ICD-10-CM | POA: Diagnosis not present

## 2021-11-18 DIAGNOSIS — M542 Cervicalgia: Secondary | ICD-10-CM

## 2021-11-18 DIAGNOSIS — M546 Pain in thoracic spine: Secondary | ICD-10-CM

## 2021-11-18 DIAGNOSIS — M62838 Other muscle spasm: Secondary | ICD-10-CM

## 2021-11-18 NOTE — Therapy (Signed)
OUTPATIENT PHYSICAL THERAPY TREATMENT NOTE   Patient Name: Tonya [name] MRN: [id number] DOB:May 11, 1951, 71 y.o., female Today's Date: 11/18/2021   PT End of Session - 11/18/21 1345     Visit Number 6    Number of Visits 12    Date for PT Re-Evaluation 12/09/21    Authorization Type HTA    PT Start Time 1345    PT Stop Time 1423    PT Time Calculation (min) 38 min    Activity Tolerance Patient tolerated treatment well    Behavior During Therapy WFL for tasks assessed/performed                Past Medical History:  Diagnosis Date   Coronary atherosclerosis due to calcified coronary lesion    History of breast cancer    Hyperlipidemia    Hypertension    Osteopenia    Past Surgical History:  Procedure Laterality Date   ABDOMINAL HYSTERECTOMY  2015   AUGMENTATION MAMMAPLASTY Left    BREAST BIOPSY Right 02/2019   BREAST IMPLANT EXCHANGE     FOOT SURGERY Left 05/25/2020   MASTECTOMY Left    REDUCTION MAMMAPLASTY Right    TONSILLECTOMY AND ADENOIDECTOMY  1958   Patient Active Problem List   Diagnosis Date Noted   Coronary artery disease due to lipid rich plaque 09/30/2021   Hepatic steatosis 09/30/2021   Adenoma of left adrenal gland 09/30/2021   Strain of left trapezius muscle 09/30/2021   Hyperlipidemia 05/19/2021   Somatic dysfunction of spine, thoracic 03/26/2021   Chronic left-sided thoracic back pain 12/30/2020   DCIS (ductal carcinoma in situ) 01/26/2020   Low back pain 01/26/2020   Chronic pain of both knees 09/19/2019   Osteopenia    Estrogen deficiency 04/14/2018   Essential hypertension 01/10/2018   Gastroesophageal reflux disease 01/10/2018   Mild intermittent asthma without complication 79/48/0165   Pain in left lower leg 03/03/2017   Family history of colon cancer 02/23/2017   Herpes simplex vulvovaginitis 02/18/2017   Neuropathy 02/18/2017   Elevated liver enzymes 02/17/2017   Need for Streptococcus pneumoniae vaccination 02/17/2017    Chondromalacia of left patella 12/28/2016   Synovitis of left knee 12/28/2016   Seasonal allergic rhinitis due to fungal spores 09/25/2016   Bone mass 09/24/2016   Chronic hip pain 09/24/2016   Dark yellow-colored urine 09/24/2016   Low oxygen saturation 09/24/2016   Other specified postprocedural states 01/17/2015   Abnormal stress electrocardiogram test 10/20/2013   Malignant neoplasm of female breast ([place]) 12/15/2010   Breast mass 12/11/2010   Family history of malignant neoplasm of breast 12/11/2010    PCP: [name], DO  REFERRING PROVIDER: Lyndal Pulley, DO   REFERRING DIAG: [phone] (ICD-10-CM) - Chronic bilateral low back pain without sciatica  THERAPY DIAG:  Pain in thoracic spine  Cervicalgia  Other muscle spasm  Joint stiffness in cervical  ONSET DATE: 10/09/2021 MD order  SUBJECTIVE:  SUBJECTIVE STATEMENT: I was able to do some gardening, less pain with deep breathing. Improved but not all better.   PERTINENT HISTORY:  CAD, Hx of Breast CA (in remission for 12-13 years), Osteopenia  PAIN:  PAIN:  Are you having pain? Yes: NPRS scale: 3-4/10 Pain location:  mid thoracic pain Pain description: achey Aggravating factors: night pain Relieving factors: laying down    PRECAUTIONS: Other: osteopenia  WEIGHT BEARING RESTRICTIONS No  FALLS:  Has patient fallen in last 6 months? No   OCCUPATION: Pt is retired  PLOF: Independent; Pt was able to perform ADLs/IADLs and mobility with less pain.   PATIENT GOALS to be pain free in cervical and thoracic, increase walking without pain, to be able cook with less pain, to be able to get on the floor and play with grandchildren   OBJECTIVE:   DIAGNOSTIC FINDINGS:  Thoracic MRI IMPRESSION: 1. No acute  osseous abnormality of the thoracic spine. 2. Mild multilevel thoracic spondylosis with scattered noncompressive disc protrusions. No foraminal or canal stenosis at any level.   TODAY'S TREATMENT   5/30: MANUAL: prone STM to bil upper trap, Rt rhomboid & midthoracic paraspinals Prone scap retraction  Retraction + shoulder extension Seated thoracic ext Seated external rotation Seated OH flexion & horiz abd with red tband Chin tucks     PATIENT EDUCATION:  Education details: POC, relevant anatomy, exercise form, and rationale of exercises.  PT answered Pt's questions.  Person educated: Patient Education method: Explanation Education comprehension: verbalized understanding and needs further education   HOME EXERCISE PROGRAM: [id number]  ASSESSMENT:  CLINICAL IMPRESSION: Improved rib mobility following manual therapy today and pt reported feeling better. Will work on exercises for improved scapular mobility, especially when she is in the car driving tomorrow. Will benefit from addition of core stability for scoliosis stabilization.    OBJECTIVE IMPAIRMENTS decreased activity tolerance, decreased endurance, decreased mobility, difficulty walking, decreased ROM, decreased strength, hypomobility, increased fascial restrictions, impaired flexibility, and pain.   ACTIVITY LIMITATIONS cleaning, community activity, meal prep, shopping, and yard work.   PERSONAL FACTORS Time since onset of injury/illness/exacerbation and 1 comorbidity: osteopenia  are also affecting patient's functional outcome.    REHAB POTENTIAL: Good  CLINICAL DECISION MAKING: Stable/uncomplicated  EVALUATION COMPLEXITY: Low   GOALS:   SHORT TERM GOALS: Target date: 11/18/2021  Pt will be independent and compliant with HEP for improved pain, ROM, strength, and function.  Baseline: Goal status: INITIAL  2.  Pt will demo improved R thoracic rotation AROM to be [facility] and 10 deg in bilat cervical Sb'ing AROM  for improved mobility and stiffness. Baseline: cervical SB 22 deg bil Goal status:achieved   3.  Pt will report at least a 25% improvement in pain and sx's overall.  Baseline:  Goal status: achieved    LONG TERM GOALS: Target date: 12/09/2021  Pt will be able to cut up vegetables, prepare food, and cook without significant pain.  Baseline:  Goal status: INITIAL  2.  Pt will be able to ambulate extended community distance without significant pain or limitation. Baseline:  Goal status: INITIAL  3.  Pt will report at least a 70% improvement in pain and sx's toward the end of the day.  Baseline:  Goal status: INITIAL  4.  Pt will report she is able to perform her normal standing activities including household chores without significant pain or difficulty.  Baseline:  Goal status: INITIAL    PLAN: PT FREQUENCY: 1-2x/week  PT DURATION: 6 weeks  PLANNED INTERVENTIONS: Therapeutic exercises, Therapeutic activity, Neuromuscular re-education, Balance training, Gait training, Patient/Family education, Joint mobilization, Stair training, Aquatic Therapy, Dry Needling, Electrical stimulation, Cryotherapy, Moist heat, Ultrasound, and Manual therapy.  PLAN FOR NEXT SESSION: continue DN PRN, STM, cont Lt scap mobility, supine foam roll exercises for thoracic opening.  Cont with postural/scapular strengthening.    Tiani Stanbery C. Alizzon Dioguardi PT, DPT 11/18/21 2:27 PM

## 2021-11-21 ENCOUNTER — Encounter (HOSPITAL_BASED_OUTPATIENT_CLINIC_OR_DEPARTMENT_OTHER): Payer: PPO | Admitting: Physical Therapy

## 2021-11-24 ENCOUNTER — Ambulatory Visit (HOSPITAL_BASED_OUTPATIENT_CLINIC_OR_DEPARTMENT_OTHER): Payer: PPO | Attending: Family Medicine | Admitting: Physical Therapy

## 2021-11-24 ENCOUNTER — Encounter (HOSPITAL_BASED_OUTPATIENT_CLINIC_OR_DEPARTMENT_OTHER): Payer: Self-pay | Admitting: Physical Therapy

## 2021-11-24 DIAGNOSIS — M546 Pain in thoracic spine: Secondary | ICD-10-CM | POA: Insufficient documentation

## 2021-11-24 DIAGNOSIS — M62838 Other muscle spasm: Secondary | ICD-10-CM | POA: Insufficient documentation

## 2021-11-24 DIAGNOSIS — M542 Cervicalgia: Secondary | ICD-10-CM | POA: Insufficient documentation

## 2021-11-24 NOTE — Therapy (Signed)
OUTPATIENT PHYSICAL THERAPY TREATMENT NOTE   Patient Name: Tonya [name] MRN: [id number] DOB:11-26-1950, 71 y.o., female Today's Date: 11/24/2021   PT End of Session - 11/24/21 1154     Visit Number 7    Number of Visits 12    Date for PT Re-Evaluation 12/09/21    Authorization Type HTA    PT Start Time 1105    PT Stop Time 1145    PT Time Calculation (min) 40 min    Activity Tolerance Patient tolerated treatment well    Behavior During Therapy WFL for tasks assessed/performed                 Past Medical History:  Diagnosis Date   Coronary atherosclerosis due to calcified coronary lesion    History of breast cancer    Hyperlipidemia    Hypertension    Osteopenia    Past Surgical History:  Procedure Laterality Date   ABDOMINAL HYSTERECTOMY  2015   AUGMENTATION MAMMAPLASTY Left    BREAST BIOPSY Right 02/2019   BREAST IMPLANT EXCHANGE     FOOT SURGERY Left 05/25/2020   MASTECTOMY Left    REDUCTION MAMMAPLASTY Right    TONSILLECTOMY AND ADENOIDECTOMY  1958   Patient Active Problem List   Diagnosis Date Noted   Coronary artery disease due to lipid rich plaque 09/30/2021   Hepatic steatosis 09/30/2021   Adenoma of left adrenal gland 09/30/2021   Strain of left trapezius muscle 09/30/2021   Hyperlipidemia 05/19/2021   Somatic dysfunction of spine, thoracic 03/26/2021   Chronic left-sided thoracic back pain 12/30/2020   DCIS (ductal carcinoma in situ) 01/26/2020   Low back pain 01/26/2020   Chronic pain of both knees 09/19/2019   Osteopenia    Estrogen deficiency 04/14/2018   Essential hypertension 01/10/2018   Gastroesophageal reflux disease 01/10/2018   Mild intermittent asthma without complication 78/24/2353   Pain in left lower leg 03/03/2017   Family history of colon cancer 02/23/2017   Herpes simplex vulvovaginitis 02/18/2017   Neuropathy 02/18/2017   Elevated liver enzymes 02/17/2017   Need for Streptococcus pneumoniae vaccination 02/17/2017    Chondromalacia of left patella 12/28/2016   Synovitis of left knee 12/28/2016   Seasonal allergic rhinitis due to fungal spores 09/25/2016   Bone mass 09/24/2016   Chronic hip pain 09/24/2016   Dark yellow-colored urine 09/24/2016   Low oxygen saturation 09/24/2016   Other specified postprocedural states 01/17/2015   Abnormal stress electrocardiogram test 10/20/2013   Malignant neoplasm of female breast ([place]) 12/15/2010   Breast mass 12/11/2010   Family history of malignant neoplasm of breast 12/11/2010    PCP: [name], DO  REFERRING PROVIDER: Lyndal Pulley, DO   REFERRING DIAG: [phone] (ICD-10-CM) - Chronic bilateral low back pain without sciatica  THERAPY DIAG:  Pain in thoracic spine  Cervicalgia  Other muscle spasm  Joint stiffness in cervical  ONSET DATE: 10/09/2021 MD order  SUBJECTIVE:  SUBJECTIVE STATEMENT: Overall feeling pretty good. Feels discomfort in Lt mid back wen turning head to the Right. Feeling the stinging sensation.   PERTINENT HISTORY:  CAD, Hx of Breast CA (in remission for 12-13 years), Osteopenia  PAIN:  PAIN:  Are you having pain? Yes: NPRS scale: 2/10 Pain location:  mid thoracic pain Pain description: dull-achey Aggravating factors: night pain Relieving factors: laying down    PRECAUTIONS: Other: osteopenia  WEIGHT BEARING RESTRICTIONS No  FALLS:  Has patient fallen in last 6 months? No   OCCUPATION: Pt is retired  PLOF: Independent; Pt was able to perform ADLs/IADLs and mobility with less pain.   PATIENT GOALS to be pain free in cervical and thoracic, increase walking without pain, to be able cook with less pain, to be able to get on the floor and play with grandchildren   OBJECTIVE:   DIAGNOSTIC FINDINGS:  Thoracic  MRI IMPRESSION: 1. No acute osseous abnormality of the thoracic spine. 2. Mild multilevel thoracic spondylosis with scattered noncompressive disc protrusions. No foraminal or canal stenosis at any level.   TODAY'S TREATMENT   6/5: TPDN with skilled palpation and monitoring followed by STM to the following muscles: Lt C7 paraspinals, Lt upper trap MANUAL rotation mobs C7 in prone, prone rib mobs- inhibition of Rt post rib cage in breathing; Ktape at C7 Lt lower rib cage depression with expansion of Rt upper +transv abdominis engagement   5/30: MANUAL: prone STM to bil upper trap, Rt rhomboid & midthoracic paraspinals Prone scap retraction  Retraction + shoulder extension Seated thoracic ext Seated external rotation Seated OH flexion & horiz abd with red tband Chin tucks     PATIENT EDUCATION:  Education details: POC, relevant anatomy, exercise form, and rationale of exercises.   Person educated: Patient Education method: Explanation Education comprehension: verbalized understanding and needs further education   HOME EXERCISE PROGRAM: [id number]  ASSESSMENT:  CLINICAL IMPRESSION: Lacking expansion of Rt upper/anterior rib cage with inhalation. Notable left orientation of C7 transverse process.    OBJECTIVE IMPAIRMENTS decreased activity tolerance, decreased endurance, decreased mobility, difficulty walking, decreased ROM, decreased strength, hypomobility, increased fascial restrictions, impaired flexibility, and pain.   ACTIVITY LIMITATIONS cleaning, community activity, meal prep, shopping, and yard work.   PERSONAL FACTORS Time since onset of injury/illness/exacerbation and 1 comorbidity: osteopenia  are also affecting patient's functional outcome.    REHAB POTENTIAL: Good  CLINICAL DECISION MAKING: Stable/uncomplicated  EVALUATION COMPLEXITY: Low   GOALS:   SHORT TERM GOALS: Target date: 11/18/2021  Pt will be independent and compliant with HEP for improved  pain, ROM, strength, and function.  Baseline: Goal status: achieved  2.  Pt will demo improved R thoracic rotation AROM to be [facility] and 10 deg in bilat cervical Sb'ing AROM for improved mobility and stiffness. Baseline: cervical SB 22 deg bil Goal status:achieved   3.  Pt will report at least a 25% improvement in pain and sx's overall.  Baseline:  Goal status: achieved    LONG TERM GOALS: Target date: 12/09/2021  Pt will be able to cut up vegetables, prepare food, and cook without significant pain.  Baseline:  Goal status: INITIAL  2.  Pt will be able to ambulate extended community distance without significant pain or limitation. Baseline:  Goal status: INITIAL  3.  Pt will report at least a 70% improvement in pain and sx's toward the end of the day.  Baseline:  Goal status: INITIAL  4.  Pt will report she is able to perform  her normal standing activities including household chores without significant pain or difficulty.  Baseline:  Goal status: INITIAL    PLAN: PT FREQUENCY: 1-2x/week  PT DURATION: 6 weeks  PLANNED INTERVENTIONS: Therapeutic exercises, Therapeutic activity, Neuromuscular re-education, Balance training, Gait training, Patient/Family education, Joint mobilization, Stair training, Aquatic Therapy, Dry Needling, Electrical stimulation, Cryotherapy, Moist heat, Ultrasound, and Manual therapy.  PLAN FOR NEXT SESSION: continue DN PRN, Rt upper rib opening/lt lower depression with abdominal engagement  Arcangel Minion C. Shellby Schlink PT, DPT 11/24/21 11:55 AM

## 2021-11-28 ENCOUNTER — Encounter (HOSPITAL_BASED_OUTPATIENT_CLINIC_OR_DEPARTMENT_OTHER): Payer: Self-pay | Admitting: Physical Therapy

## 2021-11-28 ENCOUNTER — Ambulatory Visit (HOSPITAL_BASED_OUTPATIENT_CLINIC_OR_DEPARTMENT_OTHER): Payer: PPO | Admitting: Physical Therapy

## 2021-11-28 DIAGNOSIS — M542 Cervicalgia: Secondary | ICD-10-CM

## 2021-11-28 DIAGNOSIS — M546 Pain in thoracic spine: Secondary | ICD-10-CM | POA: Diagnosis not present

## 2021-11-28 NOTE — Therapy (Signed)
OUTPATIENT PHYSICAL THERAPY TREATMENT NOTE   Patient Name: Tonya [name] MRN: [id number] DOB:1950/07/15, 71 y.o., female Today's Date: 11/28/2021   PT End of Session - 11/28/21 0944     Visit Number 8    Number of Visits 12    Date for PT Re-Evaluation 12/09/21    Authorization Type HTA    PT Start Time 0943    PT Stop Time 1017    PT Time Calculation (min) 34 min    Activity Tolerance Patient tolerated treatment well    Behavior During Therapy WFL for tasks assessed/performed                 Past Medical History:  Diagnosis Date   Coronary atherosclerosis due to calcified coronary lesion    History of breast cancer    Hyperlipidemia    Hypertension    Osteopenia    Past Surgical History:  Procedure Laterality Date   ABDOMINAL HYSTERECTOMY  2015   AUGMENTATION MAMMAPLASTY Left    BREAST BIOPSY Right 02/2019   BREAST IMPLANT EXCHANGE     FOOT SURGERY Left 05/25/2020   MASTECTOMY Left    REDUCTION MAMMAPLASTY Right    TONSILLECTOMY AND ADENOIDECTOMY  1958   Patient Active Problem List   Diagnosis Date Noted   Coronary artery disease due to lipid rich plaque 09/30/2021   Hepatic steatosis 09/30/2021   Adenoma of left adrenal gland 09/30/2021   Strain of left trapezius muscle 09/30/2021   Hyperlipidemia 05/19/2021   Somatic dysfunction of spine, thoracic 03/26/2021   Chronic left-sided thoracic back pain 12/30/2020   DCIS (ductal carcinoma in situ) 01/26/2020   Low back pain 01/26/2020   Chronic pain of both knees 09/19/2019   Osteopenia    Estrogen deficiency 04/14/2018   Essential hypertension 01/10/2018   Gastroesophageal reflux disease 01/10/2018   Mild intermittent asthma without complication 40/37/0964   Pain in left lower leg 03/03/2017   Family history of colon cancer 02/23/2017   Herpes simplex vulvovaginitis 02/18/2017   Neuropathy 02/18/2017   Elevated liver enzymes 02/17/2017   Need for Streptococcus pneumoniae vaccination 02/17/2017    Chondromalacia of left patella 12/28/2016   Synovitis of left knee 12/28/2016   Seasonal allergic rhinitis due to fungal spores 09/25/2016   Bone mass 09/24/2016   Chronic hip pain 09/24/2016   Dark yellow-colored urine 09/24/2016   Low oxygen saturation 09/24/2016   Other specified postprocedural states 01/17/2015   Abnormal stress electrocardiogram test 10/20/2013   Malignant neoplasm of female breast ([place]) 12/15/2010   Breast mass 12/11/2010   Family history of malignant neoplasm of breast 12/11/2010    PCP: [name], DO  REFERRING PROVIDER: Lyndal Pulley, DO   REFERRING DIAG: [phone] (ICD-10-CM) - Chronic bilateral low back pain without sciatica  THERAPY DIAG:  Pain in thoracic spine  Cervicalgia  Joint stiffness in cervical  ONSET DATE: 10/09/2021 MD order  SUBJECTIVE:  SUBJECTIVE STATEMENT: I had a lot of pain on Monday night, took advil and felt good the next AM. I  have felt fine in the AM but am exhausted by PM. I feel twisted.   PERTINENT HISTORY:  CAD, Hx of Breast CA (in remission for 12-13 years), Osteopenia  PAIN:  PAIN:  Are you having pain? Yes: NPRS scale: 2/10 Pain location:  mid thoracic pain Pain description: dull-achey Aggravating factors: night pain Relieving factors: laying down    PRECAUTIONS: Other: osteopenia  WEIGHT BEARING RESTRICTIONS No  FALLS:  Has patient fallen in last 6 months? No   OCCUPATION: Pt is retired  PLOF: Independent; Pt was able to perform ADLs/IADLs and mobility with less pain.   PATIENT GOALS to be pain free in cervical and thoracic, increase walking without pain, to be able cook with less pain, to be able to get on the floor and play with grandchildren   OBJECTIVE:   DIAGNOSTIC FINDINGS:  Thoracic  MRI IMPRESSION: 1. No acute osseous abnormality of the thoracic spine. 2. Mild multilevel thoracic spondylosis with scattered noncompressive disc protrusions. No foraminal or canal stenosis at any level.  POSTURE 6/9: Rib cage rotation to the left when pelvis is center Right shoulder depressed and anteriorly roted with slight trunk sidebend right   TODAY'S TREATMENT   6/9: Sticky notes under right heel improved postural alignment PRI left sidelying right glut max Standing Lt straight arm horizontal adduction Lt sidelying shoulder protraction/retratction 1 lb Lt sidelying horiz abd 1lb   6/5: TPDN with skilled palpation and monitoring followed by STM to the following muscles: Lt C7 paraspinals, Lt upper trap MANUAL rotation mobs C7 in prone, prone rib mobs- inhibition of Rt post rib cage in breathing; Ktape at C7 Lt lower rib cage depression with expansion of Rt upper +transv abdominis engagement   5/30: MANUAL: prone STM to bil upper trap, Rt rhomboid & midthoracic paraspinals Prone scap retraction  Retraction + shoulder extension Seated thoracic ext Seated external rotation Seated OH flexion & horiz abd with red tband Chin tucks     PATIENT EDUCATION:  Education details: POC, relevant anatomy, exercise form, and rationale of exercises.   Person educated: Patient Education method: Explanation Education comprehension: verbalized understanding and needs further education   HOME EXERCISE PROGRAM: [id number]  ASSESSMENT:  CLINICAL IMPRESSION: Began moving focus to full body biomechanical chain. Notable need for heel lift in right shoe as she has had in the past and was helpful but stopped wearing. Heel lifts are on backorder.    OBJECTIVE IMPAIRMENTS decreased activity tolerance, decreased endurance, decreased mobility, difficulty walking, decreased ROM, decreased strength, hypomobility, increased fascial restrictions, impaired flexibility, and pain.   ACTIVITY  LIMITATIONS cleaning, community activity, meal prep, shopping, and yard work.   PERSONAL FACTORS Time since onset of injury/illness/exacerbation and 1 comorbidity: osteopenia  are also affecting patient's functional outcome.    REHAB POTENTIAL: Good  CLINICAL DECISION MAKING: Stable/uncomplicated  EVALUATION COMPLEXITY: Low   GOALS:   SHORT TERM GOALS: Target date: 11/18/2021  Pt will be independent and compliant with HEP for improved pain, ROM, strength, and function.  Baseline: Goal status: achieved  2.  Pt will demo improved R thoracic rotation AROM to be [facility] and 10 deg in bilat cervical Sb'ing AROM for improved mobility and stiffness. Baseline: cervical SB 22 deg bil Goal status:achieved   3.  Pt will report at least a 25% improvement in pain and sx's overall.  Baseline:  Goal status: achieved  LONG TERM GOALS: Target date: 12/09/2021  Pt will be able to cut up vegetables, prepare food, and cook without significant pain.  Baseline:  Goal status: INITIAL  2.  Pt will be able to ambulate extended community distance without significant pain or limitation. Baseline:  Goal status: INITIAL  3.  Pt will report at least a 70% improvement in pain and sx's toward the end of the day.  Baseline:  Goal status: INITIAL  4.  Pt will report she is able to perform her normal standing activities including household chores without significant pain or difficulty.  Baseline:  Goal status: INITIAL    PLAN: PT FREQUENCY: 1-2x/week  PT DURATION: 6 weeks  PLANNED INTERVENTIONS: Therapeutic exercises, Therapeutic activity, Neuromuscular re-education, Balance training, Gait training, Patient/Family education, Joint mobilization, Stair training, Aquatic Therapy, Dry Needling, Electrical stimulation, Cryotherapy, Moist heat, Ultrasound, and Manual therapy.  PLAN FOR NEXT SESSION: continue DN PRN, Rt upper rib opening/lt lower depression with abdominal engagement, cont PRI  Hester Forget  C. Talayla Doyel PT, DPT 11/28/21 10:19 AM

## 2021-12-02 ENCOUNTER — Encounter (HOSPITAL_BASED_OUTPATIENT_CLINIC_OR_DEPARTMENT_OTHER): Payer: Self-pay | Admitting: Physical Therapy

## 2021-12-02 ENCOUNTER — Ambulatory Visit (HOSPITAL_BASED_OUTPATIENT_CLINIC_OR_DEPARTMENT_OTHER): Payer: PPO | Admitting: Physical Therapy

## 2021-12-02 DIAGNOSIS — M546 Pain in thoracic spine: Secondary | ICD-10-CM

## 2021-12-02 DIAGNOSIS — M62838 Other muscle spasm: Secondary | ICD-10-CM

## 2021-12-02 DIAGNOSIS — M542 Cervicalgia: Secondary | ICD-10-CM

## 2021-12-02 NOTE — Therapy (Signed)
OUTPATIENT PHYSICAL THERAPY TREATMENT NOTE   Patient Name: Tonya [name] MRN: [id number] DOB:02/26/1951, 71 y.o., female Today's Date: 12/02/2021   PT End of Session - 12/02/21 0846     Visit Number 9    Number of Visits 12    Date for PT Re-Evaluation 12/09/21    Authorization Type HTA    PT Start Time 0846    PT Stop Time 0925    PT Time Calculation (min) 39 min    Activity Tolerance Patient tolerated treatment well    Behavior During Therapy WFL for tasks assessed/performed                 Past Medical History:  Diagnosis Date   Coronary atherosclerosis due to calcified coronary lesion    History of breast cancer    Hyperlipidemia    Hypertension    Osteopenia    Past Surgical History:  Procedure Laterality Date   ABDOMINAL HYSTERECTOMY  2015   AUGMENTATION MAMMAPLASTY Left    BREAST BIOPSY Right 02/2019   BREAST IMPLANT EXCHANGE     FOOT SURGERY Left 05/25/2020   MASTECTOMY Left    REDUCTION MAMMAPLASTY Right    TONSILLECTOMY AND ADENOIDECTOMY  1958   Patient Active Problem List   Diagnosis Date Noted   Coronary artery disease due to lipid rich plaque 09/30/2021   Hepatic steatosis 09/30/2021   Adenoma of left adrenal gland 09/30/2021   Strain of left trapezius muscle 09/30/2021   Hyperlipidemia 05/19/2021   Somatic dysfunction of spine, thoracic 03/26/2021   Chronic left-sided thoracic back pain 12/30/2020   DCIS (ductal carcinoma in situ) 01/26/2020   Low back pain 01/26/2020   Chronic pain of both knees 09/19/2019   Osteopenia    Estrogen deficiency 04/14/2018   Essential hypertension 01/10/2018   Gastroesophageal reflux disease 01/10/2018   Mild intermittent asthma without complication 97/41/6384   Pain in left lower leg 03/03/2017   Family history of colon cancer 02/23/2017   Herpes simplex vulvovaginitis 02/18/2017   Neuropathy 02/18/2017   Elevated liver enzymes 02/17/2017   Need for Streptococcus pneumoniae vaccination 02/17/2017    Chondromalacia of left patella 12/28/2016   Synovitis of left knee 12/28/2016   Seasonal allergic rhinitis due to fungal spores 09/25/2016   Bone mass 09/24/2016   Chronic hip pain 09/24/2016   Dark yellow-colored urine 09/24/2016   Low oxygen saturation 09/24/2016   Other specified postprocedural states 01/17/2015   Abnormal stress electrocardiogram test 10/20/2013   Malignant neoplasm of female breast ([place]) 12/15/2010   Breast mass 12/11/2010   Family history of malignant neoplasm of breast 12/11/2010    PCP: [name], DO  REFERRING PROVIDER: Lyndal Pulley, DO   REFERRING DIAG: [phone] (ICD-10-CM) - Chronic bilateral low back pain without sciatica  THERAPY DIAG:  Pain in thoracic spine  Cervicalgia  Other muscle spasm  Joint stiffness in cervical  ONSET DATE: 10/09/2021 MD order  SUBJECTIVE:  SUBJECTIVE STATEMENT: Neck hurt as I was leaving and did a lot of driving over the weekend. Stressed but physically not as bad. Right shoulder feels pulled forward.   PERTINENT HISTORY:  CAD, Hx of Breast CA (in remission for 12-13 years), Osteopenia  PAIN:  PAIN:  Are you having pain? Yes: NPRS scale: moderate/10 Pain location:  mid thoracic pain Pain description: dull-achey Aggravating factors: night pain Relieving factors: laying down    PRECAUTIONS: Other: osteopenia  WEIGHT BEARING RESTRICTIONS No  FALLS:  Has patient fallen in last 6 months? No   OCCUPATION: Pt is retired  PLOF: Independent; Pt was able to perform ADLs/IADLs and mobility with less pain.   PATIENT GOALS to be pain free in cervical and thoracic, increase walking without pain, to be able cook with less pain, to be able to get on the floor and play with grandchildren   OBJECTIVE:    DIAGNOSTIC FINDINGS:  Thoracic MRI IMPRESSION: 1. No acute osseous abnormality of the thoracic spine. 2. Mild multilevel thoracic spondylosis with scattered noncompressive disc protrusions. No foraminal or canal stenosis at any level.  POSTURE 6/9: Rib cage rotation to the left when pelvis is center Right shoulder depressed and anteriorly roted with slight trunk sidebend right  6/13: Rt cervical rotation 38, Lt 48  TODAY'S TREATMENT  6/13: TPDN with skilled palpation and monitoring followed by STM to the following muscles: Rt upper trap MANUAL: prone rib mobs with focus on Rt upper quadrant; supine STM to scalenes, suboccipitals; supine first rib mobilization with cervical sidebend  6/9: Sticky notes under right heel improved postural alignment PRI left sidelying right glut max Standing Lt straight arm horizontal adduction Lt sidelying shoulder protraction/retratction 1 lb Lt sidelying horiz abd 1lb   6/5: TPDN with skilled palpation and monitoring followed by STM to the following muscles: Lt C7 paraspinals, Lt upper trap MANUAL rotation mobs C7 in prone, prone rib mobs- inhibition of Rt post rib cage in breathing; Ktape at C7 Lt lower rib cage depression with expansion of Rt upper +transv abdominis engagement     PATIENT EDUCATION:  Education details: POC, relevant anatomy, exercise form, and rationale of exercises.   Person educated: Patient Education method: Explanation Education comprehension: verbalized understanding and needs further education   HOME EXERCISE PROGRAM: [id number]  ASSESSMENT:  CLINICAL IMPRESSION: Notable spasm in rt posterior, upper quadrant of rib cage. Rib mobility and posture improved following manual therapy, visually still limited in right cervical rotation v Lt but less shoulder hike.   OBJECTIVE IMPAIRMENTS decreased activity tolerance, decreased endurance, decreased mobility, difficulty walking, decreased ROM, decreased strength,  hypomobility, increased fascial restrictions, impaired flexibility, and pain.   ACTIVITY LIMITATIONS cleaning, community activity, meal prep, shopping, and yard work.   PERSONAL FACTORS Time since onset of injury/illness/exacerbation and 1 comorbidity: osteopenia  are also affecting patient's functional outcome.    REHAB POTENTIAL: Good  CLINICAL DECISION MAKING: Stable/uncomplicated  EVALUATION COMPLEXITY: Low   GOALS:   SHORT TERM GOALS: Target date: 11/18/2021  Pt will be independent and compliant with HEP for improved pain, ROM, strength, and function.  Baseline: Goal status: achieved  2.  Pt will demo improved R thoracic rotation AROM to be [facility] and 10 deg in bilat cervical Sb'ing AROM for improved mobility and stiffness. Baseline: cervical SB 22 deg bil Goal status:achieved   3.  Pt will report at least a 25% improvement in pain and sx's overall.  Baseline:  Goal status: achieved    LONG TERM GOALS: Target  date: 12/09/2021  Pt will be able to cut up vegetables, prepare food, and cook without significant pain.  Baseline:  Goal status: INITIAL  2.  Pt will be able to ambulate extended community distance without significant pain or limitation. Baseline:  Goal status: INITIAL  3.  Pt will report at least a 70% improvement in pain and sx's toward the end of the day.  Baseline:  Goal status: INITIAL  4.  Pt will report she is able to perform her normal standing activities including household chores without significant pain or difficulty.  Baseline:  Goal status: INITIAL    PLAN: PT FREQUENCY: 1-2x/week  PT DURATION: 6 weeks  PLANNED INTERVENTIONS: Therapeutic exercises, Therapeutic activity, Neuromuscular re-education, Balance training, Gait training, Patient/Family education, Joint mobilization, Stair training, Aquatic Therapy, Dry Needling, Electrical stimulation, Cryotherapy, Moist heat, Ultrasound, and Manual therapy.  PLAN FOR NEXT SESSION: continue DN  PRN, Rt upper rib opening/lt lower depression with abdominal engagement, cont PRI  Nialah Saravia C. Gerene Nedd PT, DPT 12/02/21 9:30 AM

## 2021-12-03 ENCOUNTER — Other Ambulatory Visit: Payer: Self-pay | Admitting: Cardiology

## 2021-12-03 DIAGNOSIS — I10 Essential (primary) hypertension: Secondary | ICD-10-CM | POA: Diagnosis not present

## 2021-12-03 DIAGNOSIS — E785 Hyperlipidemia, unspecified: Secondary | ICD-10-CM | POA: Diagnosis not present

## 2021-12-03 DIAGNOSIS — I2510 Atherosclerotic heart disease of native coronary artery without angina pectoris: Secondary | ICD-10-CM | POA: Diagnosis not present

## 2021-12-04 LAB — CMP14+EGFR
ALT: 30 IU/L (ref 0–32)
AST: 27 IU/L (ref 0–40)
Albumin/Globulin Ratio: 1.9 (ref 1.2–2.2)
Albumin: 4.6 g/dL (ref 3.8–4.8)
Alkaline Phosphatase: 79 IU/L (ref 44–121)
BUN/Creatinine Ratio: 22 (ref 12–28)
BUN: 17 mg/dL (ref 8–27)
Bilirubin Total: 0.8 mg/dL (ref 0.0–1.2)
CO2: 23 mmol/L (ref 20–29)
Calcium: 9.8 mg/dL (ref 8.7–10.3)
Chloride: 106 mmol/L (ref 96–106)
Creatinine, Ser: 0.79 mg/dL (ref 0.57–1.00)
Globulin, Total: 2.4 g/dL (ref 1.5–4.5)
Glucose: 101 mg/dL — ABNORMAL HIGH (ref 70–99)
Potassium: 4.6 mmol/L (ref 3.5–5.2)
Sodium: 142 mmol/L (ref 134–144)
Total Protein: 7 g/dL (ref 6.0–8.5)
eGFR: 81 mL/min/{1.73_m2} (ref >59)

## 2021-12-04 LAB — LIPID PANEL WITH LDL/HDL RATIO
Cholesterol, Total: 151 mg/dL (ref 100–199)
HDL: 65 mg/dL (ref >39)
LDL Chol Calc (NIH): 68 mg/dL (ref 0–99)
LDL/HDL Ratio: 1 ratio (ref 0.0–3.2)
Triglycerides: 96 mg/dL (ref 0–149)
VLDL Cholesterol Cal: 18 mg/dL (ref 5–40)

## 2021-12-04 LAB — LDL CHOLESTEROL, DIRECT: LDL Direct: 74 mg/dL (ref 0–99)

## 2021-12-04 NOTE — Therapy (Signed)
OUTPATIENT PHYSICAL THERAPY TREATMENT NOTE   Patient Name: Tonya [name] MRN: [id number] DOB:May 14, 1951, 71 y.o., female Today's Date: 12/05/2021   PT End of Session - 12/05/21 1320     Visit Number 10    Number of Visits 12    Date for PT Re-Evaluation 12/09/21    Authorization Type HTA    PT Start Time 1200    PT Stop Time 1229    PT Time Calculation (min) 29 min    Activity Tolerance Patient tolerated treatment well    Behavior During Therapy WFL for tasks assessed/performed                  Past Medical History:  Diagnosis Date   Coronary atherosclerosis due to calcified coronary lesion    History of breast cancer    Hyperlipidemia    Hypertension    Osteopenia    Past Surgical History:  Procedure Laterality Date   ABDOMINAL HYSTERECTOMY  2015   AUGMENTATION MAMMAPLASTY Left    BREAST BIOPSY Right 02/2019   BREAST IMPLANT EXCHANGE     FOOT SURGERY Left 05/25/2020   MASTECTOMY Left    REDUCTION MAMMAPLASTY Right    TONSILLECTOMY AND ADENOIDECTOMY  1958   Patient Active Problem List   Diagnosis Date Noted   Coronary artery disease due to lipid rich plaque 09/30/2021   Hepatic steatosis 09/30/2021   Adenoma of left adrenal gland 09/30/2021   Strain of left trapezius muscle 09/30/2021   Hyperlipidemia 05/19/2021   Somatic dysfunction of spine, thoracic 03/26/2021   Chronic left-sided thoracic back pain 12/30/2020   DCIS (ductal carcinoma in situ) 01/26/2020   Low back pain 01/26/2020   Chronic pain of both knees 09/19/2019   Osteopenia    Estrogen deficiency 04/14/2018   Essential hypertension 01/10/2018   Gastroesophageal reflux disease 01/10/2018   Mild intermittent asthma without complication 41/58/3094   Pain in left lower leg 03/03/2017   Family history of colon cancer 02/23/2017   Herpes simplex vulvovaginitis 02/18/2017   Neuropathy 02/18/2017   Elevated liver enzymes 02/17/2017   Need for Streptococcus pneumoniae vaccination 02/17/2017    Chondromalacia of left patella 12/28/2016   Synovitis of left knee 12/28/2016   Seasonal allergic rhinitis due to fungal spores 09/25/2016   Bone mass 09/24/2016   Chronic hip pain 09/24/2016   Dark yellow-colored urine 09/24/2016   Low oxygen saturation 09/24/2016   Other specified postprocedural states 01/17/2015   Abnormal stress electrocardiogram test 10/20/2013   Malignant neoplasm of female breast ([place]) 12/15/2010   Breast mass 12/11/2010   Family history of malignant neoplasm of breast 12/11/2010    PCP: [name], DO  REFERRING PROVIDER: Lyndal Pulley, DO   REFERRING DIAG: [phone] (ICD-10-CM) - Chronic bilateral low back pain without sciatica  THERAPY DIAG:  Pain in thoracic spine  Cervicalgia  Other muscle spasm  Joint stiffness in cervical  ONSET DATE: 10/09/2021 MD order  SUBJECTIVE:  SUBJECTIVE STATEMENT: High stress levels. Did some light gardening. Did start to feel the stinging pain when chopping but it felt better after I had my husband squeeze the muscle.   PERTINENT HISTORY:  CAD, Hx of Breast CA (in remission for 12-13 years), Osteopenia  PAIN:  PAIN:  Are you having pain? Yes: NPRS scale: moderate/10 Pain location:  mid thoracic pain Pain description: dull-achey Aggravating factors: night pain Relieving factors: laying down    PRECAUTIONS: Other: osteopenia  WEIGHT BEARING RESTRICTIONS No  FALLS:  Has patient fallen in last 6 months? No   OCCUPATION: Pt is retired  PLOF: Independent; Pt was able to perform ADLs/IADLs and mobility with less pain.   PATIENT GOALS to be pain free in cervical and thoracic, increase walking without pain, to be able cook with less pain, to be able to get on the floor and play with  grandchildren   OBJECTIVE:   DIAGNOSTIC FINDINGS:  Thoracic MRI IMPRESSION: 1. No acute osseous abnormality of the thoracic spine. 2. Mild multilevel thoracic spondylosis with scattered noncompressive disc protrusions. No foraminal or canal stenosis at any level.  POSTURE 6/9: Rib cage rotation to the left when pelvis is center Right shoulder depressed and anteriorly roted with slight trunk sidebend right  6/13: Rt cervical rotation 38, Lt 48  TODAY'S TREATMENT  6/16: MANUAL: prone thoracic and rib mobs, STM Lt periscapular region Qped: protraction/retraction, chin tuck- began head turns and moved to seated Seated chin tuck with head turns- req cues to avoid shoulder elevation   6/13: TPDN with skilled palpation and monitoring followed by STM to the following muscles: Rt upper trap MANUAL: prone rib mobs with focus on Rt upper quadrant; supine STM to scalenes, suboccipitals; supine first rib mobilization with cervical sidebend  6/9: Sticky notes under right heel improved postural alignment PRI left sidelying right glut max Standing Lt straight arm horizontal adduction Lt sidelying shoulder protraction/retratction 1 lb Lt sidelying horiz abd 1lb    PATIENT EDUCATION:  Education details: POC, relevant anatomy, exercise form, and rationale of exercises.   Person educated: Patient Education method: Explanation Education comprehension: verbalized understanding and needs further education   HOME EXERCISE PROGRAM: [id number]  ASSESSMENT:  CLINICAL IMPRESSION: Able to resolve cavitations with proper chin tuck. Notable limitation in Lt cervical rotation.    OBJECTIVE IMPAIRMENTS decreased activity tolerance, decreased endurance, decreased mobility, difficulty walking, decreased ROM, decreased strength, hypomobility, increased fascial restrictions, impaired flexibility, and pain.   ACTIVITY LIMITATIONS cleaning, community activity, meal prep, shopping, and yard work.    PERSONAL FACTORS Time since onset of injury/illness/exacerbation and 1 comorbidity: osteopenia  are also affecting patient's functional outcome.    REHAB POTENTIAL: Good  CLINICAL DECISION MAKING: Stable/uncomplicated  EVALUATION COMPLEXITY: Low   GOALS:   SHORT TERM GOALS: Target date: 11/18/2021  Pt will be independent and compliant with HEP for improved pain, ROM, strength, and function.  Baseline: Goal status: achieved  2.  Pt will demo improved R thoracic rotation AROM to be [facility] and 10 deg in bilat cervical Sb'ing AROM for improved mobility and stiffness. Baseline: cervical SB 22 deg bil Goal status:achieved   3.  Pt will report at least a 25% improvement in pain and sx's overall.  Baseline:  Goal status: achieved    LONG TERM GOALS: Target date: 12/09/2021  Pt will be able to cut up vegetables, prepare food, and cook without significant pain.  Baseline:  Goal status: INITIAL  2.  Pt will be able to ambulate extended  community distance without significant pain or limitation. Baseline:  Goal status: INITIAL  3.  Pt will report at least a 70% improvement in pain and sx's toward the end of the day.  Baseline:  Goal status: INITIAL  4.  Pt will report she is able to perform her normal standing activities including household chores without significant pain or difficulty.  Baseline:  Goal status: INITIAL    PLAN: PT FREQUENCY: 1-2x/week  PT DURATION: 6 weeks  PLANNED INTERVENTIONS: Therapeutic exercises, Therapeutic activity, Neuromuscular re-education, Balance training, Gait training, Patient/Family education, Joint mobilization, Stair training, Aquatic Therapy, Dry Needling, Electrical stimulation, Cryotherapy, Moist heat, Ultrasound, and Manual therapy.  PLAN FOR NEXT SESSION: C0 on 1 mobilization  Jahmari Esbenshade C. Evetta Renner PT, DPT 12/05/21 1:22 PM

## 2021-12-05 ENCOUNTER — Ambulatory Visit (HOSPITAL_BASED_OUTPATIENT_CLINIC_OR_DEPARTMENT_OTHER): Payer: PPO | Admitting: Physical Therapy

## 2021-12-05 ENCOUNTER — Encounter (HOSPITAL_BASED_OUTPATIENT_CLINIC_OR_DEPARTMENT_OTHER): Payer: Self-pay | Admitting: Physical Therapy

## 2021-12-05 DIAGNOSIS — M542 Cervicalgia: Secondary | ICD-10-CM

## 2021-12-05 DIAGNOSIS — M546 Pain in thoracic spine: Secondary | ICD-10-CM | POA: Diagnosis not present

## 2021-12-05 DIAGNOSIS — M62838 Other muscle spasm: Secondary | ICD-10-CM

## 2021-12-07 ENCOUNTER — Other Ambulatory Visit: Payer: Self-pay | Admitting: Cardiology

## 2021-12-07 DIAGNOSIS — I2510 Atherosclerotic heart disease of native coronary artery without angina pectoris: Secondary | ICD-10-CM

## 2021-12-09 ENCOUNTER — Ambulatory Visit (HOSPITAL_BASED_OUTPATIENT_CLINIC_OR_DEPARTMENT_OTHER): Payer: PPO | Admitting: Physical Therapy

## 2021-12-09 ENCOUNTER — Encounter (HOSPITAL_BASED_OUTPATIENT_CLINIC_OR_DEPARTMENT_OTHER): Payer: Self-pay | Admitting: Physical Therapy

## 2021-12-09 DIAGNOSIS — M542 Cervicalgia: Secondary | ICD-10-CM

## 2021-12-09 DIAGNOSIS — M546 Pain in thoracic spine: Secondary | ICD-10-CM

## 2021-12-09 DIAGNOSIS — M62838 Other muscle spasm: Secondary | ICD-10-CM

## 2021-12-09 NOTE — Therapy (Signed)
OUTPATIENT PHYSICAL THERAPY TREATMENT NOTE   Patient Name: DAMIAN HOFSTRA MRN: [id number] DOB:August 30, 1950, 71 y.o., female Today's Date: 12/09/2021   PT End of Session - 12/09/21 1015     Visit Number 11    Number of Visits 22    Date for PT Re-Evaluation 01/19/22    Authorization Type HTA    PT Start Time 1039    PT Stop Time 1115    PT Time Calculation (min) 36 min    Activity Tolerance Patient tolerated treatment well    Behavior During Therapy WFL for tasks assessed/performed                   Past Medical History:  Diagnosis Date   Coronary atherosclerosis due to calcified coronary lesion    History of breast cancer    Hyperlipidemia    Hypertension    Osteopenia    Past Surgical History:  Procedure Laterality Date   ABDOMINAL HYSTERECTOMY  2015   AUGMENTATION MAMMAPLASTY Left    BREAST BIOPSY Right 02/2019   BREAST IMPLANT EXCHANGE     FOOT SURGERY Left 05/25/2020   MASTECTOMY Left    REDUCTION MAMMAPLASTY Right    TONSILLECTOMY AND ADENOIDECTOMY  1958   Patient Active Problem List   Diagnosis Date Noted   Coronary artery disease due to lipid rich plaque 09/30/2021   Hepatic steatosis 09/30/2021   Adenoma of left adrenal gland 09/30/2021   Strain of left trapezius muscle 09/30/2021   Hyperlipidemia 05/19/2021   Somatic dysfunction of spine, thoracic 03/26/2021   Chronic left-sided thoracic back pain 12/30/2020   DCIS (ductal carcinoma in situ) 01/26/2020   Low back pain 01/26/2020   Chronic pain of both knees 09/19/2019   Osteopenia    Estrogen deficiency 04/14/2018   Essential hypertension 01/10/2018   Gastroesophageal reflux disease 01/10/2018   Mild intermittent asthma without complication 50/38/8828   Pain in left lower leg 03/03/2017   Family history of colon cancer 02/23/2017   Herpes simplex vulvovaginitis 02/18/2017   Neuropathy 02/18/2017   Elevated liver enzymes 02/17/2017   Need for Streptococcus pneumoniae vaccination  02/17/2017   Chondromalacia of left patella 12/28/2016   Synovitis of left knee 12/28/2016   Seasonal allergic rhinitis due to fungal spores 09/25/2016   Bone mass 09/24/2016   Chronic hip pain 09/24/2016   Dark yellow-colored urine 09/24/2016   Low oxygen saturation 09/24/2016   Other specified postprocedural states 01/17/2015   Abnormal stress electrocardiogram test 10/20/2013   Malignant neoplasm of female breast ([place]) 12/15/2010   Breast mass 12/11/2010   Family history of malignant neoplasm of breast 12/11/2010    PCP: [name], DO  REFERRING PROVIDER: Lyndal Pulley, DO   REFERRING DIAG: [phone] (ICD-10-CM) - Chronic bilateral low back pain without sciatica  THERAPY DIAG:  Cervicalgia - Plan: PT plan of care cert/re-cert  Pain in thoracic spine - Plan: PT plan of care cert/re-cert  Other muscle spasm - Plan: PT plan of care cert/re-cert  Joint stiffness in cervical  ONSET DATE: 10/09/2021 MD order  SUBJECTIVE:  SUBJECTIVE STATEMENT: Right now neck is not too bad. My neck on the right side is pulling and hurting into my eye. One time I got a really loud crack one time. My head feels spacey. Did a little gardening but no digging, not much cooking.   PERTINENT HISTORY:  CAD, Hx of Breast CA (in remission for 12-13 years), Osteopenia  PAIN:  PAIN:  Are you having pain? Yes: NPRS scale: 3-4/10 Pain location:  mid thoracic pain Pain description: dull-achey Aggravating factors: night pain Relieving factors: laying down    PRECAUTIONS: Other: osteopenia  WEIGHT BEARING RESTRICTIONS No  FALLS:  Has patient fallen in last 6 months? No   OCCUPATION: Pt is retired  PLOF: Independent; Pt was able to perform ADLs/IADLs and mobility with less pain.   PATIENT  GOALS to be pain free in cervical and thoracic, increase walking without pain, to be able cook with less pain, to be able to get on the floor and play with grandchildren   OBJECTIVE:   DIAGNOSTIC FINDINGS:  Thoracic MRI IMPRESSION: 1. No acute osseous abnormality of the thoracic spine. 2. Mild multilevel thoracic spondylosis with scattered noncompressive disc protrusions. No foraminal or canal stenosis at any level.  POSTURE  6/9: Rib cage rotation to the left when pelvis is center Right shoulder depressed and anteriorly roted with slight trunk sidebend right  6/13: Rt cervical rotation 38, Lt 48  6/20: cervical SB Rt 20, Lt 18 Rotation 46 bilaterally  TODAY'S TREATMENT  6/20: MANUAL: suboccipital release- focus on Rt; Grade 4 C0 on 1 with added sidebend Row anchored with blue tband Scapular retraction with bar held in front of thighs Shoulder flexion with bar to 90, cues for scap control  6/16: MANUAL: prone thoracic and rib mobs, STM Lt periscapular region Qped: protraction/retraction, chin tuck- began head turns and moved to seated Seated chin tuck with head turns- req cues to avoid shoulder elevation   6/13: TPDN with skilled palpation and monitoring followed by STM to the following muscles: Rt upper trap MANUAL: prone rib mobs with focus on Rt upper quadrant; supine STM to scalenes, suboccipitals; supine first rib mobilization with cervical sidebend  6/9: Sticky notes under right heel improved postural alignment PRI left sidelying right glut max Standing Lt straight arm horizontal adduction Lt sidelying shoulder protraction/retratction 1 lb Lt sidelying horiz abd 1lb    PATIENT EDUCATION:  Education details: POC, relevant anatomy, exercise form, and rationale of exercises.   Person educated: Patient Education method: Explanation Education comprehension: verbalized understanding and needs further education   HOME EXERCISE  PROGRAM: [id number]  ASSESSMENT:  CLINICAL IMPRESSION: Improved ease of motion following manual. PT provided inhibitory pressure to Rt levator during a few reps of rows and then pt was able to demo improved motion.    OBJECTIVE IMPAIRMENTS decreased activity tolerance, decreased endurance, decreased mobility, difficulty walking, decreased ROM, decreased strength, hypomobility, increased fascial restrictions, impaired flexibility, and pain.   ACTIVITY LIMITATIONS cleaning, community activity, meal prep, shopping, and yard work.   PERSONAL FACTORS Time since onset of injury/illness/exacerbation and 1 comorbidity: osteopenia  are also affecting patient's functional outcome.    REHAB POTENTIAL: Good  CLINICAL DECISION MAKING: Stable/uncomplicated  EVALUATION COMPLEXITY: Low   GOALS:   SHORT TERM GOALS: Target date: 11/18/2021  Pt will be independent and compliant with HEP for improved pain, ROM, strength, and function.  Baseline: Goal status: achieved  2.  Pt will demo improved R thoracic rotation AROM to be [facility] and 10 deg  in bilat cervical Sb'ing AROM for improved mobility and stiffness. Baseline: cervical SB 22 deg bil Goal status:achieved   3.  Pt will report at least a 25% improvement in pain and sx's overall.  Baseline:  Goal status: achieved    LONG TERM GOALS: Target date: 01/19/22  Pt will be able to cut up vegetables, prepare food, and cook without significant pain.  Baseline:  Goal status: partially met- improved tolerance but can still get some pain  2.  Pt will be able to ambulate extended community distance without significant pain or limitation. Baseline:  Goal status: achieved  3.  Pt will report at least a 70% improvement in pain and sx's toward the end of the day.  Baseline:  Goal status: ongoing  4.  Pt will report she is able to perform her normal standing activities including household chores without significant pain or difficulty.  Baseline:   Goal status: ongoing    PLAN: PT FREQUENCY: 1-2x/week  PT DURATION: 6 weeks  PLANNED INTERVENTIONS: Therapeutic exercises, Therapeutic activity, Neuromuscular re-education, Balance training, Gait training, Patient/Family education, Joint mobilization, Stair training, Aquatic Therapy, Dry Needling, Electrical stimulation, Cryotherapy, Moist heat, Ultrasound, and Manual therapy.  PLAN FOR NEXT SESSION: CONT MOBS PRN, progress periscap stab.   Areanna Gengler C. Meryle Pugmire PT, DPT 12/09/21 4:52 PM

## 2021-12-11 ENCOUNTER — Encounter (HOSPITAL_BASED_OUTPATIENT_CLINIC_OR_DEPARTMENT_OTHER): Payer: Self-pay | Admitting: Physical Therapy

## 2021-12-11 ENCOUNTER — Ambulatory Visit (HOSPITAL_BASED_OUTPATIENT_CLINIC_OR_DEPARTMENT_OTHER): Payer: PPO | Admitting: Physical Therapy

## 2021-12-11 DIAGNOSIS — M546 Pain in thoracic spine: Secondary | ICD-10-CM

## 2021-12-11 DIAGNOSIS — M542 Cervicalgia: Secondary | ICD-10-CM

## 2021-12-11 NOTE — Therapy (Signed)
OUTPATIENT PHYSICAL THERAPY TREATMENT NOTE   Patient Name: Tonya [name] MRN: [id number] DOB:[birth year], 71 y.o., female Today's Date: 12/11/2021   PT End of Session - 12/11/21 0930     Visit Number 12    Number of Visits 22    Date for PT Re-Evaluation 01/19/22    Authorization Type HTA    PT Start Time 0930    PT Stop Time 6644    PT Time Calculation (min) 44 min    Activity Tolerance Patient tolerated treatment well    Behavior During Therapy WFL for tasks assessed/performed                   Past Medical History:  Diagnosis Date   Coronary atherosclerosis due to calcified coronary lesion    History of breast cancer    Hyperlipidemia    Hypertension    Osteopenia    Past Surgical History:  Procedure Laterality Date   ABDOMINAL HYSTERECTOMY  2015   AUGMENTATION MAMMAPLASTY Left    BREAST BIOPSY Right 02/2019   BREAST IMPLANT EXCHANGE     FOOT SURGERY Left 05/25/2020   MASTECTOMY Left    REDUCTION MAMMAPLASTY Right    TONSILLECTOMY AND ADENOIDECTOMY  1958   Patient Active Problem List   Diagnosis Date Noted   Coronary artery disease due to lipid rich plaque 09/30/2021   Hepatic steatosis 09/30/2021   Adenoma of left adrenal gland 09/30/2021   Strain of left trapezius muscle 09/30/2021   Hyperlipidemia 05/19/2021   Somatic dysfunction of spine, thoracic 03/26/2021   Chronic left-sided thoracic back pain 12/30/2020   DCIS (ductal carcinoma in situ) 01/26/2020   Low back pain 01/26/2020   Chronic pain of both knees 09/19/2019   Osteopenia    Estrogen deficiency 04/14/2018   Essential hypertension 01/10/2018   Gastroesophageal reflux disease 01/10/2018   Mild intermittent asthma without complication 03/47/4259   Pain in left lower leg 03/03/2017   Family history of colon cancer 02/23/2017   Herpes simplex vulvovaginitis 02/18/2017   Neuropathy 02/18/2017   Elevated liver enzymes 02/17/2017   Need for Streptococcus pneumoniae vaccination  02/17/2017   Chondromalacia of left patella 12/28/2016   Synovitis of left knee 12/28/2016   Seasonal allergic rhinitis due to fungal spores 09/25/2016   Bone mass 09/24/2016   Chronic hip pain 09/24/2016   Dark yellow-colored urine 09/24/2016   Low oxygen saturation 09/24/2016   Other specified postprocedural states 01/17/2015   Abnormal stress electrocardiogram test 10/20/2013   Malignant neoplasm of female breast ([place]) 12/15/2010   Breast mass 12/11/2010   Family history of malignant neoplasm of breast 12/11/2010    PCP: [name], DO  REFERRING PROVIDER: Lyndal Pulley, DO   REFERRING DIAG: 8120813404 (ICD-10-CM) - Chronic bilateral low back pain without sciatica  THERAPY DIAG:  Cervicalgia  Pain in thoracic spine  Joint stiffness in cervical  ONSET DATE: 10/09/2021 MD order  SUBJECTIVE:  SUBJECTIVE STATEMENT: As usual, AM appts are good. It does get tight in the evening, feels some improvement with exercises and a little rubbing by husband helps. It feels like the bone hurts.   PERTINENT HISTORY:  CAD, Hx of Breast CA (in remission for 12-13 years), Osteopenia  PAIN:  PAIN:  Are you having pain? Yes: NPRS scale: 0/10 Pain location:  mid thoracic pain Pain description: dull-achey Aggravating factors: night pain Relieving factors: laying down    PRECAUTIONS: Other: osteopenia  WEIGHT BEARING RESTRICTIONS No  FALLS:  Has patient fallen in last 6 months? No   OCCUPATION: Pt is retired  PLOF: Independent; Pt was able to perform ADLs/IADLs and mobility with less pain.   PATIENT GOALS to be pain free in cervical and thoracic, increase walking without pain, to be able cook with less pain, to be able to get on the floor and play with grandchildren   OBJECTIVE:    DIAGNOSTIC FINDINGS:  Thoracic MRI IMPRESSION: 1. No acute osseous abnormality of the thoracic spine. 2. Mild multilevel thoracic spondylosis with scattered noncompressive disc protrusions. No foraminal or canal stenosis at any level.  POSTURE  6/9: Rib cage rotation to the left when pelvis is center Right shoulder depressed and anteriorly roted with slight trunk sidebend right  6/13: Rt cervical rotation 38, Lt 48  6/20: cervical SB Rt 20, Lt 18 Rotation 46 bilaterally  TODAY'S TREATMENT  6/22: UBE3 min L2 MANUAL: Lt sidelying- Rt scapular mobilization & distraction with STM to rhomboid; prone- gross rib & thoracic mobs; C7 Rt to Lt rotation mobs 3lb bar- scap retraction, snap to chest, overhead press with back against wall 8lb, single arm bent over row & lift to bottom shelf Upper trap stretch Levator stretch- unable to feel when stretching Lt side due to pinching on Rt at C7   6/20: MANUAL: suboccipital release- focus on Rt; Grade 4 C0 on 1 with added sidebend Row anchored with blue tband Scapular retraction with bar held in front of thighs Shoulder flexion with bar to 90, cues for scap control  6/16: MANUAL: prone thoracic and rib mobs, STM Lt periscapular region Qped: protraction/retraction, chin tuck- began head turns and moved to seated Seated chin tuck with head turns- req cues to avoid shoulder elevation      PATIENT EDUCATION:  Education details: POC, relevant anatomy, exercise form, and rationale of exercises.   Person educated: Patient Education method: Explanation Education comprehension: verbalized understanding and needs further education   HOME EXERCISE PROGRAM: [id number]  ASSESSMENT:  CLINICAL IMPRESSION: Improved mobility overall but still very tight through subscap on right making distraction difficult. Able to achieve proper motion with only gentle tactile cues to upper trap rather than PT applied pressure.no pinching in rt cervical  rotation but does feel pinching when she adds flexion to the rotation.    OBJECTIVE IMPAIRMENTS decreased activity tolerance, decreased endurance, decreased mobility, difficulty walking, decreased ROM, decreased strength, hypomobility, increased fascial restrictions, impaired flexibility, and pain.   ACTIVITY LIMITATIONS cleaning, community activity, meal prep, shopping, and yard work.   PERSONAL FACTORS Time since onset of injury/illness/exacerbation and 1 comorbidity: osteopenia  are also affecting patient's functional outcome.    REHAB POTENTIAL: Good  CLINICAL DECISION MAKING: Stable/uncomplicated  EVALUATION COMPLEXITY: Low   GOALS:   SHORT TERM GOALS: Target date: 11/18/2021  Pt will be independent and compliant with HEP for improved pain, ROM, strength, and function.  Baseline: Goal status: achieved  2.  Pt will demo improved R thoracic  rotation AROM to be [facility] and 10 deg in bilat cervical Sb'ing AROM for improved mobility and stiffness. Baseline: cervical SB 22 deg bil Goal status:achieved   3.  Pt will report at least a 25% improvement in pain and sx's overall.  Baseline:  Goal status: achieved    LONG TERM GOALS: Target date: 01/19/22  Pt will be able to cut up vegetables, prepare food, and cook without significant pain.  Baseline:  Goal status: partially met- improved tolerance but can still get some pain  2.  Pt will be able to ambulate extended community distance without significant pain or limitation. Baseline:  Goal status: achieved  3.  Pt will report at least a 70% improvement in pain and sx's toward the end of the day.  Baseline:  Goal status: ongoing  4.  Pt will report she is able to perform her normal standing activities including household chores without significant pain or difficulty.  Baseline:  Goal status: ongoing    PLAN: PT FREQUENCY: 1-2x/week  PT DURATION: 6 weeks  PLANNED INTERVENTIONS: Therapeutic exercises, Therapeutic  activity, Neuromuscular re-education, Balance training, Gait training, Patient/Family education, Joint mobilization, Stair training, Aquatic Therapy, Dry Needling, Electrical stimulation, Cryotherapy, Moist heat, Ultrasound, and Manual therapy.  PLAN FOR NEXT SESSION: CONT MOBS PRN, progress periscap stab.   Joylynn Defrancesco C. Raydin Bielinski PT, DPT 12/11/21 10:22 AM

## 2021-12-16 ENCOUNTER — Ambulatory Visit (HOSPITAL_BASED_OUTPATIENT_CLINIC_OR_DEPARTMENT_OTHER): Payer: PPO | Admitting: Physical Therapy

## 2021-12-16 ENCOUNTER — Encounter (HOSPITAL_BASED_OUTPATIENT_CLINIC_OR_DEPARTMENT_OTHER): Payer: Self-pay | Admitting: Physical Therapy

## 2021-12-16 ENCOUNTER — Ambulatory Visit: Payer: PPO | Admitting: Family Medicine

## 2021-12-16 DIAGNOSIS — M546 Pain in thoracic spine: Secondary | ICD-10-CM | POA: Diagnosis not present

## 2021-12-16 DIAGNOSIS — M62838 Other muscle spasm: Secondary | ICD-10-CM

## 2021-12-16 DIAGNOSIS — M542 Cervicalgia: Secondary | ICD-10-CM

## 2021-12-17 ENCOUNTER — Ambulatory Visit: Payer: PPO | Admitting: Cardiology

## 2021-12-17 ENCOUNTER — Ambulatory Visit: Payer: PPO | Admitting: Family Medicine

## 2021-12-17 ENCOUNTER — Encounter: Payer: Self-pay | Admitting: Cardiology

## 2021-12-17 VITALS — BP 109/70 | HR 71 | Temp 98.9°F | Resp 16 | Ht 67.0 in | Wt 218.0 lb

## 2021-12-17 DIAGNOSIS — E7800 Pure hypercholesterolemia, unspecified: Secondary | ICD-10-CM | POA: Diagnosis not present

## 2021-12-17 DIAGNOSIS — I10 Essential (primary) hypertension: Secondary | ICD-10-CM

## 2021-12-17 DIAGNOSIS — I2510 Atherosclerotic heart disease of native coronary artery without angina pectoris: Secondary | ICD-10-CM

## 2021-12-17 DIAGNOSIS — I2583 Coronary atherosclerosis due to lipid rich plaque: Secondary | ICD-10-CM | POA: Diagnosis not present

## 2021-12-17 MED ORDER — LOSARTAN POTASSIUM 50 MG PO TABS: 25.0000 mg | ORAL_TABLET | Freq: Every day | ORAL | 0 refills | Status: DC

## 2021-12-17 NOTE — Progress Notes (Signed)
ID:  Tonya [name], DOB January 28, 1951, MRN [id number]  PCP:  [name], DO  Cardiologist:  Rex Kras, DO, [facility] (established care Oct 20, 2021) Former Cardiology Providers: Dr. Lubertha Basque ([facility]), Dr. Oswaldo Milian (04/2021)  Date: 12/17/21 Last Office Visit: 10/20/2021  Chief Complaint  Patient presents with   Coronary artery calcification   Hyperlipidemia   Follow-up    8 weeks    HPI  Tonya [name] is a 71 y.o. Caucasian female whose past medical history and cardiovascular risk factors include: Hypertension, mild nonobstructive CAD (coronary CTA 06/3242), diastolic dysfunction, family history of heart disease, osteopenia, Hx of breast cancer, postmenopausal female, advanced age.  She is referred to the office at the request of [name]* for evaluation of evaluation for coronary artery disease.  Patient is noted to have mild nonobstructive CAD with mixed plaque in the proximal LAD and a total coronary calcium score of 9.2 placing her at the 50th percentile.  She was recommended to be on statin therapy.  She has tried both atorvastatin and rosuvastatin and did not tolerate it well.  She was then later referred to lipid clinic for possible PCSK9 therapy.    After seeing to cardiologist he was referred to the practice for further evaluation and management.  At the last office visit she was already on Zetia 10 mg p.o. daily and I started her on pravastatin.  She has had repeat labs and now presents for follow-up.  Clinically denies angina pectoris or heart failure symptoms.  She is tolerating pravastatin well without any side effects or intolerances.  Most recent lipid profile independently reviewed and LDL levels are now within acceptable range.    ALLERGIES: Allergies  Allergen Reactions   Crestor [Rosuvastatin]     Myalgias    Lipitor [Atorvastatin]     myalgias    MEDICATION LIST PRIOR TO VISIT: Current Meds  Medication Sig   aspirin EC 81  MG tablet Take 1 tablet (81 mg total) by mouth daily. Swallow whole.   atenolol (TENORMIN) 50 MG tablet    calcium-vitamin D (OSCAL WITH D) 250-125 MG-UNIT tablet Take 1 tablet by mouth daily.   montelukast (SINGULAIR) 10 MG tablet TAKE 1 TABLET BY MOUTH EVERYDAY AT BEDTIME   pantoprazole (PROTONIX) 20 MG tablet Take 1 tablet (20 mg total) by mouth daily.   pravastatin (PRAVACHOL) 40 MG tablet TAKE 1 TABLET BY MOUTH EVERY DAY IN THE EVENING   [DISCONTINUED] losartan (COZAAR) 50 MG tablet TAKE 1 TABLET BY MOUTH EVERY DAY     PAST MEDICAL HISTORY: Past Medical History:  Diagnosis Date   Coronary atherosclerosis due to calcified coronary lesion    History of breast cancer    Hyperlipidemia    Hypertension    Osteopenia     PAST SURGICAL HISTORY: Past Surgical History:  Procedure Laterality Date   ABDOMINAL HYSTERECTOMY  2015   AUGMENTATION MAMMAPLASTY Left    BREAST BIOPSY Right 02/2019   BREAST IMPLANT EXCHANGE     FOOT SURGERY Left 05/25/2020   MASTECTOMY Left    REDUCTION MAMMAPLASTY Right    TONSILLECTOMY AND ADENOIDECTOMY  1958    FAMILY HISTORY: The patient family history includes Breast cancer in her maternal aunt and paternal aunt; Cancer in her brother; Heart attack in her father and mother; Lung disease in her brother.  SOCIAL HISTORY:  The patient  reports that she has never smoked. She has never used smokeless tobacco. She reports current alcohol use. She reports  that she does not use drugs.  REVIEW OF SYSTEMS: Review of Systems  Cardiovascular:  Negative for chest pain, claudication, dyspnea on exertion, leg swelling, near-syncope, orthopnea, palpitations, paroxysmal nocturnal dyspnea and syncope.  Respiratory:  Negative for shortness of breath.   Musculoskeletal:  Positive for back pain.  Neurological:  Positive for light-headedness.    PHYSICAL EXAM:    12/17/2021    9:54 AM 11/04/2021    3:16 PM 10/27/2021   10:03 AM  Vitals with BMI  Height '5\' 7"'$  '5\' 7"'$   '5\' 7"'$   Weight 218 lbs 218 lbs 2 oz 219 lbs 4 oz  BMI 34.14 85.46 27.03  Systolic 500 938 182  Diastolic 70 70 72  Pulse 71 82 75    CONSTITUTIONAL: Well-developed and well-nourished. No acute distress.  SKIN: Skin is warm and dry. No rash noted. No cyanosis. No pallor. No jaundice HEAD: Normocephalic and atraumatic.  EYES: No scleral icterus MOUTH/THROAT: Moist oral membranes.  NECK: No JVD present. No thyromegaly noted. No carotid bruits  CHEST Normal respiratory effort. No intercostal retractions  LUNGS: Clear to auscultation bilaterally.  No stridor. No wheezes. No rales.  CARDIOVASCULAR: Regular rate and rhythm, positive S1-S2, no murmurs rubs or gallops appreciated. ABDOMINAL: Obese, soft, nontender, nondistended, positive bowel sounds in all 4 quadrants, no apparent ascites.  EXTREMITIES: No peripheral edema, warm to touch, 2+ bilateral DP and PT pulses HEMATOLOGIC: No significant bruising NEUROLOGIC: Oriented to person, place, and time. Nonfocal. Normal muscle tone.  PSYCHIATRIC: Normal mood and affect. Normal behavior. Cooperative No significant change in physical examination since last visit  CARDIAC DATABASE: EKG: Oct 20, 2021: Normal sinus rhythm, 64 bpm, without underlying ischemia injury pattern  Echocardiogram: 02/25/2021: LVEF 99-37%, grade 1 diastolic impairment, RV systolic function low normal, trivial MR, estimated RAP 3 mmHg.  Stress Testing: No results found for this or any previous visit from the past 1095 days.  CCTA  02/10/2021: Mild nonobstructive CAD. Coronary calcium score 9.2, 50th percentile Noncardiac findings: Steatotic liver  Heart Catheterization: None  LABORATORY DATA:    Latest Ref Rng & Units 09/20/2020   11:29 AM 08/25/2019    9:19 AM  CBC  WBC 4.0 - 10.5 K/uL 7.9  8.2   Hemoglobin 12.0 - 15.0 g/dL 14.2  13.9   Hematocrit 36.0 - 46.0 % 41.8  41.1   Platelets 150.0 - 400.0 K/uL 181.0  206        Latest Ref Rng & Units 12/03/2021     8:50 AM 07/28/2021   10:12 AM 02/12/2021   11:14 AM  CMP  Glucose 70 - 99 mg/dL 101     BUN 8 - 27 mg/dL 17     Creatinine 0.57 - 1.00 mg/dL 0.79     Sodium 134 - 144 mmol/L 142     Potassium 3.5 - 5.2 mmol/L 4.6     Chloride 96 - 106 mmol/L 106     CO2 20 - 29 mmol/L 23     Calcium 8.7 - 10.3 mg/dL 9.8     Total Protein 6.0 - 8.5 g/dL 7.0  7.0  7.1   Total Bilirubin 0.0 - 1.2 mg/dL 0.8  1.1  0.7   Alkaline Phos 44 - 121 IU/L 79  63  89   AST 0 - 40 IU/L '27  26  31   '$ ALT 0 - 32 IU/L 30  27  34     Lipid Panel  Lab Results  Component Value Date   CHOL  151 12/03/2021   HDL 65 12/03/2021   LDLCALC 68 12/03/2021   LDLDIRECT 74 12/03/2021   TRIG 96 12/03/2021   CHOLHDL 3 07/28/2021    No components found for: "NTPROBNP" No results for input(s): "PROBNP" in the last 8760 hours. No results for input(s): "TSH" in the last 8760 hours.  BMP Recent Labs    01/29/21 1118 12/03/21 0850  NA 142 142  K 5.0 4.6  CL 102 106  CO2 24 23  GLUCOSE 77 101*  BUN 15 17  CREATININE 0.84 0.79  CALCIUM 9.9 9.8    HEMOGLOBIN A1C No results found for: "HGBA1C", "MPG"  IMPRESSION:    ICD-10-CM   1. Coronary atherosclerosis due to lipid rich plaque  I25.10    I25.83     2. Essential hypertension  I10 losartan (COZAAR) 50 MG tablet    3. Pure hypercholesterolemia  E78.00        RECOMMENDATIONS: Tonya [name] is a 71 y.o. Caucasian  female whose past medical history and cardiac risk factors include: Hypertension, mild nonobstructive CAD (coronary CTA 08/2353), diastolic dysfunction, family history of heart disease, osteopenia, Hx of breast cancer, postmenopausal female, advanced age.  Coronary atherosclerosis due to lipid rich plaque Denies angina pectoris. Coronary CTA 01/2021: Total CAC 9.2, 50th percentile. Echo 02/25/2021: LVEF 73-22%, grade 1 diastolic dysfunction, low normal RV function, no significant valvular heart disease. EKG: Nonischemic Continue aspirin and statin  therapy.   Continue Zetia  No additional diagnostic testing warranted as she is had very good work-up and remains asymptomatic.  Educated on importance of improving her modifiable cardiovascular risk factors, management of benign essential hypertension, importance of lipid management, and increasing physical activity to 30 minutes a day 5 days a week as tolerated.  Pure hypercholesterolemia LDL 139 mg/dL (03/2018), 101 mg/dL (February 2023), 74 mg/dL (June 2023) Currently on Zetia 10 mg p.o. daily. Has been intolerant to Lipitor and Crestor due to headaches and nausea. Has tolerated the initiation of pravastatin 40 mg p.o. nightly well.  Essential hypertension Very well controlled. However, complains of feeling lightheaded and woozy at times.   She does not have an upcoming appointment with PCP in the near future. For now recommended reducing losartan to 25 mg p.o. every afternoon.  And she is instructed to take atenolol and hydrochlorothiazide in the morning.   We will defer additional antihypertensive medication/management to PCP.  I would like to see the patient back in 1 year after her well visit or sooner if change in clinical status.  She is agreeable with the plan of care and is thankful.  FINAL MEDICATION LIST END OF ENCOUNTER: Meds ordered this encounter  Medications   losartan (COZAAR) 50 MG tablet    Sig: Take 0.5 tablets (25 mg total) by mouth daily at 10 pm.    Dispense:  15 tablet    Refill:  0    Medications Discontinued During This Encounter  Medication Reason   cetirizine (ZYRTEC) 10 MG tablet    metroNIDAZOLE (METROGEL) 0.75 % vaginal gel Completed Course   losartan (COZAAR) 50 MG tablet      Current Outpatient Medications:    aspirin EC 81 MG tablet, Take 1 tablet (81 mg total) by mouth daily. Swallow whole., Disp: 30 tablet, Rfl: 11   atenolol (TENORMIN) 50 MG tablet, , Disp: , Rfl:    calcium-vitamin D (OSCAL WITH D) 250-125 MG-UNIT tablet, Take 1 tablet by  mouth daily., Disp: , Rfl:    montelukast (SINGULAIR)  10 MG tablet, TAKE 1 TABLET BY MOUTH EVERYDAY AT BEDTIME, Disp: 90 tablet, Rfl: 3   pantoprazole (PROTONIX) 20 MG tablet, Take 1 tablet (20 mg total) by mouth daily., Disp: 90 tablet, Rfl: 2   pravastatin (PRAVACHOL) 40 MG tablet, TAKE 1 TABLET BY MOUTH EVERY DAY IN THE EVENING, Disp: 90 tablet, Rfl: 0   losartan (COZAAR) 50 MG tablet, Take 0.5 tablets (25 mg total) by mouth daily at 10 pm., Disp: 15 tablet, Rfl: 0  No orders of the defined types were placed in this encounter.   There are no Patient Instructions on file for this visit.   --Continue cardiac medications as reconciled in final medication list. --Return in about 14 months (around 02/22/2023) for Follow up mild nonobstructive CAD.. Or sooner if needed. --Continue follow-up with your primary care physician regarding the management of your other chronic comorbid conditions.  Patient's questions and concerns were addressed to her satisfaction. She voices understanding of the instructions provided during this encounter.   This note was created using a voice recognition software as a result there may be grammatical errors inadvertently enclosed that do not reflect the nature of this encounter. Every attempt is made to correct such errors.  Rex Kras, [place], [facility]  Pager: [phone] Office: [phone]

## 2021-12-18 ENCOUNTER — Ambulatory Visit (HOSPITAL_BASED_OUTPATIENT_CLINIC_OR_DEPARTMENT_OTHER): Payer: PPO | Admitting: Physical Therapy

## 2021-12-18 ENCOUNTER — Ambulatory Visit (INDEPENDENT_AMBULATORY_CARE_PROVIDER_SITE_OTHER): Payer: PPO

## 2021-12-18 ENCOUNTER — Encounter (HOSPITAL_BASED_OUTPATIENT_CLINIC_OR_DEPARTMENT_OTHER): Payer: Self-pay | Admitting: Physical Therapy

## 2021-12-18 ENCOUNTER — Ambulatory Visit (INDEPENDENT_AMBULATORY_CARE_PROVIDER_SITE_OTHER): Payer: PPO | Admitting: Orthopaedic Surgery

## 2021-12-18 DIAGNOSIS — M50323 Other cervical disc degeneration at C6-C7 level: Secondary | ICD-10-CM | POA: Diagnosis not present

## 2021-12-18 DIAGNOSIS — M47818 Spondylosis without myelopathy or radiculopathy, sacral and sacrococcygeal region: Secondary | ICD-10-CM | POA: Diagnosis not present

## 2021-12-18 DIAGNOSIS — M542 Cervicalgia: Secondary | ICD-10-CM

## 2021-12-18 DIAGNOSIS — M25551 Pain in right hip: Secondary | ICD-10-CM | POA: Diagnosis not present

## 2021-12-18 DIAGNOSIS — G2589 Other specified extrapyramidal and movement disorders: Secondary | ICD-10-CM | POA: Diagnosis not present

## 2021-12-18 DIAGNOSIS — M546 Pain in thoracic spine: Secondary | ICD-10-CM | POA: Diagnosis not present

## 2021-12-18 DIAGNOSIS — M2170 Unequal limb length (acquired), unspecified site: Secondary | ICD-10-CM | POA: Diagnosis not present

## 2021-12-18 NOTE — Therapy (Signed)
OUTPATIENT PHYSICAL THERAPY TREATMENT NOTE   Patient Name: Tonya [name] MRN: [id number] DOB:[birth year], 71 y.o., female Today's Date: 12/18/2021   PT End of Session - 12/18/21 1025     Visit Number 14    Number of Visits 22    Date for PT Re-Evaluation 01/19/22    Authorization Type HTA    PT Start Time 0933    PT Stop Time 0802    PT Time Calculation (min) 41 min    Activity Tolerance Patient tolerated treatment well    Behavior During Therapy WFL for tasks assessed/performed                    Past Medical History:  Diagnosis Date   Coronary atherosclerosis due to calcified coronary lesion    History of breast cancer    Hyperlipidemia    Hypertension    Osteopenia    Past Surgical History:  Procedure Laterality Date   ABDOMINAL HYSTERECTOMY  2015   AUGMENTATION MAMMAPLASTY Left    BREAST BIOPSY Right 02/2019   BREAST IMPLANT EXCHANGE     FOOT SURGERY Left 05/25/2020   MASTECTOMY Left    REDUCTION MAMMAPLASTY Right    TONSILLECTOMY AND ADENOIDECTOMY  1958   Patient Active Problem List   Diagnosis Date Noted   Coronary artery disease due to lipid rich plaque 09/30/2021   Hepatic steatosis 09/30/2021   Adenoma of left adrenal gland 09/30/2021   Strain of left trapezius muscle 09/30/2021   Hyperlipidemia 05/19/2021   Somatic dysfunction of spine, thoracic 03/26/2021   Chronic left-sided thoracic back pain 12/30/2020   DCIS (ductal carcinoma in situ) 01/26/2020   Low back pain 01/26/2020   Chronic pain of both knees 09/19/2019   Osteopenia    Estrogen deficiency 04/14/2018   Essential hypertension 01/10/2018   Gastroesophageal reflux disease 01/10/2018   Mild intermittent asthma without complication 23/36/1224   Pain in left lower leg 03/03/2017   Family history of colon cancer 02/23/2017   Herpes simplex vulvovaginitis 02/18/2017   Neuropathy 02/18/2017   Elevated liver enzymes 02/17/2017   Need for Streptococcus pneumoniae vaccination  02/17/2017   Chondromalacia of left patella 12/28/2016   Synovitis of left knee 12/28/2016   Seasonal allergic rhinitis due to fungal spores 09/25/2016   Bone mass 09/24/2016   Chronic hip pain 09/24/2016   Dark yellow-colored urine 09/24/2016   Low oxygen saturation 09/24/2016   Other specified postprocedural states 01/17/2015   Abnormal stress electrocardiogram test 10/20/2013   Malignant neoplasm of female breast ([place]) 12/15/2010   Breast mass 12/11/2010   Family history of malignant neoplasm of breast 12/11/2010    PCP: [name], DO  REFERRING PROVIDER: Lyndal Pulley, DO   REFERRING DIAG: [phone] (ICD-10-CM) - Chronic bilateral low back pain without sciatica  THERAPY DIAG:  Cervicalgia  Pain in thoracic spine  Joint stiffness in cervical  ONSET DATE: 10/09/2021 MD order  SUBJECTIVE:  SUBJECTIVE STATEMENT: Laying hooklying every 2 hours made my neck feel better. Yesterday I was not able to get it every 2 hours, was super busy. I hurt everywhere after I do too much.   PERTINENT HISTORY:  CAD, Hx of Breast CA (in remission for 12-13 years), Osteopenia  PAIN:  PAIN:  Are you having pain? Yes: NPRS scale: 0/10 Pain location:  mid thoracic pain Pain description: dull-achey Aggravating factors: night pain Relieving factors: laying down    PRECAUTIONS: Other: osteopenia  WEIGHT BEARING RESTRICTIONS No  FALLS:  Has patient fallen in last 6 months? No   OCCUPATION: Pt is retired  PLOF: Independent; Pt was able to perform ADLs/IADLs and mobility with less pain.   PATIENT GOALS to be pain free in cervical and thoracic, increase walking without pain, to be able cook with less pain, to be able to get on the floor and play with grandchildren   OBJECTIVE:    6/29 BP 119/72    pulse 67 bpm  6/29 Leg length: superior tibial tuberosity to inf med maleolus: Rt 34.5 cm, Lt 35.5 cm  DIAGNOSTIC FINDINGS:  Thoracic MRI IMPRESSION: 1. No acute osseous abnormality of the thoracic spine. 2. Mild multilevel thoracic spondylosis with scattered noncompressive disc protrusions. No foraminal or canal stenosis at any level.  POSTURE  6/9: Rib cage rotation to the left when pelvis is center Right shoulder depressed and anteriorly roted with slight trunk sidebend right  6/13: Rt cervical rotation 38, Lt 48  6/20: cervical SB Rt 20, Lt 18 Rotation 46 bilaterally  6/29- neutral spinal alignment in kneeling and seated; standing = lumbar levoscoliosis and thoracic dextroscoliosis  TODAY'S TREATMENT  6/29: Qped cat/cow Qped core contraction Child pose- midline & hands to right to stretch left side Standing hip hinge with shoe on right, no shoe on left Rt clam x20 Rt hip abd with small circles  6/27: MANUAL: bil upper trap STM, bil cervical paraspinal STM with incr tenderness at Rt C7 Rt rib cage inf/ext rot mob, added passive trunk motion toward Rt sidebend through thoracic region to stretch Lt lats & low trap Small cavitation noted at Rt upper thoracic through grade 4 mobs  6/22: UBE3 min L2 MANUAL: Lt sidelying- Rt scapular mobilization & distraction with STM to rhomboid; prone- gross rib & thoracic mobs; C7 Rt to Lt rotation mobs 3lb bar- scap retraction, snap to chest, overhead press with back against wall 8lb, single arm bent over row & lift to bottom shelf Upper trap stretch Levator stretch- unable to feel when stretching Lt side due to pinching on Rt at C7   6/20: MANUAL: suboccipital release- focus on Rt; Grade 4 C0 on 1 with added sidebend Row anchored with blue tband Scapular retraction with bar held in front of thighs Shoulder flexion with bar to 90, cues for scap control  6/16: MANUAL: prone thoracic and rib mobs, STM Lt  periscapular region Qped: protraction/retraction, chin tuck- began head turns and moved to seated Seated chin tuck with head turns- req cues to avoid shoulder elevation      PATIENT EDUCATION:  Education details: POC, relevant anatomy, exercise form, and rationale of exercises.   Person educated: Patient Education method: Explanation Education comprehension: verbalized understanding and needs further education   HOME EXERCISE PROGRAM: [id number]  ASSESSMENT:  CLINICAL IMPRESSION: Flexible scoliosis evident in changing postures and noted that she had less neck pain with frequent hooklying. Will continue to work on lumbopelvic stability to decrease biomechanical chain pain.  OBJECTIVE IMPAIRMENTS decreased activity tolerance, decreased endurance, decreased mobility, difficulty walking, decreased ROM, decreased strength, hypomobility, increased fascial restrictions, impaired flexibility, and pain.   ACTIVITY LIMITATIONS cleaning, community activity, meal prep, shopping, and yard work.   PERSONAL FACTORS Time since onset of injury/illness/exacerbation and 1 comorbidity: osteopenia  are also affecting patient's functional outcome.    REHAB POTENTIAL: Good  CLINICAL DECISION MAKING: Stable/uncomplicated  EVALUATION COMPLEXITY: Low   GOALS:   SHORT TERM GOALS: Target date: 11/18/2021  Pt will be independent and compliant with HEP for improved pain, ROM, strength, and function.  Baseline: Goal status: achieved  2.  Pt will demo improved R thoracic rotation AROM to be [facility] and 10 deg in bilat cervical Sb'ing AROM for improved mobility and stiffness. Baseline: cervical SB 22 deg bil Goal status:achieved   3.  Pt will report at least a 25% improvement in pain and sx's overall.  Baseline:  Goal status: achieved    LONG TERM GOALS: Target date: 01/19/22  Pt will be able to cut up vegetables, prepare food, and cook without significant pain.  Baseline:  Goal status:  partially met- improved tolerance but can still get some pain  2.  Pt will be able to ambulate extended community distance without significant pain or limitation. Baseline:  Goal status: achieved  3.  Pt will report at least a 70% improvement in pain and sx's toward the end of the day.  Baseline:  Goal status: ongoing  4.  Pt will report she is able to perform her normal standing activities including household chores without significant pain or difficulty.  Baseline:  Goal status: ongoing    PLAN: PT FREQUENCY: 1-2x/week  PT DURATION: 6 weeks  PLANNED INTERVENTIONS: Therapeutic exercises, Therapeutic activity, Neuromuscular re-education, Balance training, Gait training, Patient/Family education, Joint mobilization, Stair training, Aquatic Therapy, Dry Needling, Electrical stimulation, Cryotherapy, Moist heat, Ultrasound, and Manual therapy.  PLAN FOR NEXT SESSION: CONT MOBS PRN, progress periscap stab.   Wolfe Camarena C. Nazar Kuan PT, DPT 12/18/21 1:27 PM

## 2021-12-18 NOTE — Progress Notes (Signed)
Chief Complaint: Left shoulder pain, right hip pain     History of Present Illness:    Tonya [name] is a 71 y.o. female presents today with left shoulder pain which has been worse over the course the last year and a half.  She states that this time she did have a revision breast surgery in the setting of previous mastectomy.  She states that since this time there is a mesh that was placed due to weakness in the serratus type area.  Since that time she has had issues around the shoulder blade and upper trapezius area.  This has been improving with physical therapy although she continues to experience pain in the upper shoulder.  Denies any pain in the lateral aspect of the shoulder.  With regard to the right hip she complains of pain predominantly about the SI joint.  This is painful occasionally with sitting directly on it.    Surgical History:   Previous left shoulder mastectomy in 2020  PMH/PSH/Family History/Social History/Meds/Allergies:    Past Medical History:  Diagnosis Date   Coronary atherosclerosis due to calcified coronary lesion    History of breast cancer    Hyperlipidemia    Hypertension    Osteopenia    Past Surgical History:  Procedure Laterality Date   ABDOMINAL HYSTERECTOMY  2015   AUGMENTATION MAMMAPLASTY Left    BREAST BIOPSY Right 02/2019   BREAST IMPLANT EXCHANGE     FOOT SURGERY Left 05/25/2020   MASTECTOMY Left    REDUCTION MAMMAPLASTY Right    TONSILLECTOMY AND ADENOIDECTOMY  1958   Social History   Socioeconomic History   Marital status: Married    Spouse name: Not on file   Number of children: 1   Years of education: Not on file   Highest education level: Not on file  Occupational History   Not on file  Tobacco Use   Smoking status: Never   Smokeless tobacco: Never  Vaping Use   Vaping Use: Never used  Substance and Sexual Activity   Alcohol use: Yes    Comment: rarely   Drug use: Never   Sexual  activity: Yes    Birth control/protection: None  Other Topics Concern   Not on file  Social History Narrative   Not on file   Social Determinants of Health   Financial Resource Strain: Low Risk  (11/04/2021)   Overall Financial Resource Strain (CARDIA)    Difficulty of Paying Living Expenses: Not hard at all  Food Insecurity: No Food Insecurity (11/04/2021)   Hunger Vital Sign    Worried About Running Out of Food in the Last Year: Never true    Ran Out of Food in the Last Year: Never true  Transportation Needs: No Transportation Needs (11/04/2021)   PRAPARE - Hydrologist (Medical): No    Lack of Transportation (Non-Medical): No  Physical Activity: Inactive (11/04/2021)   Exercise Vital Sign    Days of Exercise per Week: 0 days    Minutes of Exercise per Session: 0 min  Stress: No Stress Concern Present (11/04/2021)   [place]    Feeling of Stress : Not at all  Social Connections: Moderately Isolated (11/04/2021)   Social Connection and Isolation Panel [NHANES]  Frequency of Communication with Friends and Family: More than three times a week    Frequency of Social Gatherings with Friends and Family: Twice a week    Attends Religious Services: Never    Marine scientist or Organizations: No    Attends Music therapist: Never    Marital Status: Married   Family History  Problem Relation Age of Onset   Heart attack Mother    Heart attack Father        died from   Lung disease Brother    Cancer Brother    Breast cancer Maternal Aunt    Breast cancer Paternal Aunt    Allergies  Allergen Reactions   Crestor [Rosuvastatin]     Myalgias    Lipitor [Atorvastatin]     myalgias   Current Outpatient Medications  Medication Sig Dispense Refill   aspirin EC 81 MG tablet Take 1 tablet (81 mg total) by mouth daily. Swallow whole. 30 tablet 11   atenolol (TENORMIN) 50  MG tablet      calcium-vitamin D (OSCAL WITH D) 250-125 MG-UNIT tablet Take 1 tablet by mouth daily.     losartan (COZAAR) 50 MG tablet Take 0.5 tablets (25 mg total) by mouth daily at 10 pm. 15 tablet 0   montelukast (SINGULAIR) 10 MG tablet TAKE 1 TABLET BY MOUTH EVERYDAY AT BEDTIME 90 tablet 3   pantoprazole (PROTONIX) 20 MG tablet Take 1 tablet (20 mg total) by mouth daily. 90 tablet 2   pravastatin (PRAVACHOL) 40 MG tablet TAKE 1 TABLET BY MOUTH EVERY DAY IN THE EVENING 90 tablet 0   No current facility-administered medications for this visit.   No results found.  Review of Systems:   A ROS was performed including pertinent positives and negatives as documented in the HPI.  Physical Exam :   Constitutional: NAD and appears stated age Neurological: Alert and oriented Psych: Appropriate affect and cooperative There were no vitals taken for this visit.   Comprehensive Musculoskeletal Exam:    Musculoskeletal Exam    Inspection Right Left  Skin No atrophy or winging Positive winging  Palpation    Tenderness None Upper trapezius  Range of Motion    Flexion (passive) 170 170  Flexion (active) 170 170  Abduction 170 170  ER at the side 70 70  Can reach behind back to T12 T12  Strength     Full Ful  Special Tests    Pseudoparalytic No No  Neurologic    Fires PIN, radial, median, ulnar, musculocutaneous, axillary, suprascapular, long thoracic, and spinal accessory innervated muscles. No abnormal sensibility  Vascular/Lymphatic    Radial Pulse 2+ 2+  Cervical Exam    Patient has symmetric cervical range of motion with negative Spurling's test.  Special Test:      Imaging:   Xray (3 views left shoulder): Normal   I personally reviewed and interpreted the radiographs.   Assessment:   71 y.o. female right-hand-dominant female presents with left shoulder scapular dyskinesis in the setting of revision breast surgery mastectomy which resulted in weakness of her serratus  anterior.  I did describe that ultimately this does quite well with physical therapy.  To that effect she will continue working with physical therapy.  I have also advised her on a cervical brace which can help as well with her upper trapezial type symptoms.  With regard to the right hip I believe that her SI joint has become symptomatic.  I have offered and  advised SI injection although she prefers to defer this as it is not significant enough for that  Plan :    -She will return to clinic as needed if no improvement with physical therapy     I personally saw and evaluated the patient, and participated in the management and treatment plan.  Vanetta Mulders, MD Attending Physician, Orthopedic Surgery  This document was dictated using Dragon voice recognition software. A reasonable attempt at proof reading has been made to minimize errors.

## 2021-12-25 ENCOUNTER — Ambulatory Visit (HOSPITAL_BASED_OUTPATIENT_CLINIC_OR_DEPARTMENT_OTHER): Payer: PPO | Attending: Family Medicine | Admitting: Physical Therapy

## 2021-12-25 DIAGNOSIS — M546 Pain in thoracic spine: Secondary | ICD-10-CM | POA: Diagnosis not present

## 2021-12-25 DIAGNOSIS — M542 Cervicalgia: Secondary | ICD-10-CM | POA: Insufficient documentation

## 2021-12-25 DIAGNOSIS — M62838 Other muscle spasm: Secondary | ICD-10-CM | POA: Diagnosis not present

## 2021-12-25 NOTE — Therapy (Signed)
OUTPATIENT PHYSICAL THERAPY TREATMENT NOTE   Patient Name: Tonya [name] MRN: [id number] DOB:1950/10/04, 71 y.o., female Today's Date: 12/26/2021   PT End of Session - 12/26/21 1158     Visit Number 15    Number of Visits 22    Date for PT Re-Evaluation 01/19/22    Authorization Type HTA    PT Start Time 1302    PT Stop Time 1340    PT Time Calculation (min) 38 min    Activity Tolerance Patient tolerated treatment well    Behavior During Therapy WFL for tasks assessed/performed                     Past Medical History:  Diagnosis Date   Coronary atherosclerosis due to calcified coronary lesion    History of breast cancer    Hyperlipidemia    Hypertension    Osteopenia    Past Surgical History:  Procedure Laterality Date   ABDOMINAL HYSTERECTOMY  2015   AUGMENTATION MAMMAPLASTY Left    BREAST BIOPSY Right 02/2019   BREAST IMPLANT EXCHANGE     FOOT SURGERY Left 05/25/2020   MASTECTOMY Left    REDUCTION MAMMAPLASTY Right    TONSILLECTOMY AND ADENOIDECTOMY  1958   Patient Active Problem List   Diagnosis Date Noted   Coronary artery disease due to lipid rich plaque 09/30/2021   Hepatic steatosis 09/30/2021   Adenoma of left adrenal gland 09/30/2021   Strain of left trapezius muscle 09/30/2021   Hyperlipidemia 05/19/2021   Somatic dysfunction of spine, thoracic 03/26/2021   Chronic left-sided thoracic back pain 12/30/2020   DCIS (ductal carcinoma in situ) 01/26/2020   Low back pain 01/26/2020   Chronic pain of both knees 09/19/2019   Osteopenia    Estrogen deficiency 04/14/2018   Essential hypertension 01/10/2018   Gastroesophageal reflux disease 01/10/2018   Mild intermittent asthma without complication 85/27/7824   Pain in left lower leg 03/03/2017   Family history of colon cancer 02/23/2017   Herpes simplex vulvovaginitis 02/18/2017   Neuropathy 02/18/2017   Elevated liver enzymes 02/17/2017   Need for Streptococcus pneumoniae vaccination  02/17/2017   Chondromalacia of left patella 12/28/2016   Synovitis of left knee 12/28/2016   Seasonal allergic rhinitis due to fungal spores 09/25/2016   Bone mass 09/24/2016   Chronic hip pain 09/24/2016   Dark yellow-colored urine 09/24/2016   Low oxygen saturation 09/24/2016   Other specified postprocedural states 01/17/2015   Abnormal stress electrocardiogram test 10/20/2013   Malignant neoplasm of female breast ([place]) 12/15/2010   Breast mass 12/11/2010   Family history of malignant neoplasm of breast 12/11/2010    PCP: [name], DO  REFERRING PROVIDER: Lyndal Pulley, DO   REFERRING DIAG: [phone] (ICD-10-CM) - Chronic bilateral low back pain without sciatica  THERAPY DIAG:  Cervicalgia  Pain in thoracic spine  Other muscle spasm  Joint stiffness in cervical  ONSET DATE: 10/09/2021 MD order  SUBJECTIVE:  SUBJECTIVE STATEMENT: I went to the MD and he told me I needed a heel lift so I have been wearing one. It seems to help. My neck has only hurt a little for a short time and I was able to make it go away.   PERTINENT HISTORY:  CAD, Hx of Breast CA (in remission for 12-13 years), Osteopenia  PAIN:  PAIN:  Are you having pain? Yes: NPRS scale: 0/10 Pain location:  mid thoracic pain Pain description: dull-achey Aggravating factors: night pain Relieving factors: laying down    PRECAUTIONS: Other: osteopenia  WEIGHT BEARING RESTRICTIONS No  FALLS:  Has patient fallen in last 6 months? No   OCCUPATION: Pt is retired  PLOF: Independent; Pt was able to perform ADLs/IADLs and mobility with less pain.   PATIENT GOALS to be pain free in cervical and thoracic, increase walking without pain, to be able cook with less pain, to be able to get on the floor and play  with grandchildren   OBJECTIVE:   6/29 BP 119/72    pulse 67 bpm  6/29 Leg length: superior tibial tuberosity to inf med maleolus: Rt 34.5 cm, Lt 35.5 cm  DIAGNOSTIC FINDINGS:  Thoracic MRI IMPRESSION: 1. No acute osseous abnormality of the thoracic spine. 2. Mild multilevel thoracic spondylosis with scattered noncompressive disc protrusions. No foraminal or canal stenosis at any level.  POSTURE  6/9: Rib cage rotation to the left when pelvis is center Right shoulder depressed and anteriorly roted with slight trunk sidebend right  6/13: Rt cervical rotation 38, Lt 48  6/20: cervical SB Rt 20, Lt 18 Rotation 46 bilaterally  6/29- neutral spinal alignment in kneeling and seated; standing = lumbar levoscoliosis and thoracic dextroscoliosis  TODAY'S TREATMENT   7/6: MANUAL: supine STM to bil upper traps, suboccipitals, Rt scalenes Y lift off at wall Sidelying: open book, clam, hip circles    6/29: Qped cat/cow Qped core contraction Child pose- midline & hands to right to stretch left side Standing hip hinge with shoe on right, no shoe on left Rt clam x20 Rt hip abd with small circles  6/27: MANUAL: bil upper trap STM, bil cervical paraspinal STM with incr tenderness at Rt C7 Rt rib cage inf/ext rot mob, added passive trunk motion toward Rt sidebend through thoracic region to stretch Lt lats & low trap Small cavitation noted at Rt upper thoracic through grade 4 mobs     PATIENT EDUCATION:  Education details: POC, relevant anatomy, exercise form, and rationale of exercises.   Person educated: Patient Education method: Explanation Education comprehension: verbalized understanding and needs further education   HOME EXERCISE PROGRAM: [id number]  ASSESSMENT:  CLINICAL IMPRESSION: I believe heel lift will continue to make significant difference in  her pain. Extended time on manual today to reduce spasm.    OBJECTIVE IMPAIRMENTS decreased activity tolerance,  decreased endurance, decreased mobility, difficulty walking, decreased ROM, decreased strength, hypomobility, increased fascial restrictions, impaired flexibility, and pain.   ACTIVITY LIMITATIONS cleaning, community activity, meal prep, shopping, and yard work.   PERSONAL FACTORS Time since onset of injury/illness/exacerbation and 1 comorbidity: osteopenia  are also affecting patient's functional outcome.    REHAB POTENTIAL: Good  CLINICAL DECISION MAKING: Stable/uncomplicated  EVALUATION COMPLEXITY: Low   GOALS:   SHORT TERM GOALS: Target date: 11/18/2021  Pt will be independent and compliant with HEP for improved pain, ROM, strength, and function.  Baseline: Goal status: achieved  2.  Pt will demo improved R thoracic rotation AROM to be  WFL and 10 deg in bilat cervical Sb'ing AROM for improved mobility and stiffness. Baseline: cervical SB 22 deg bil Goal status:achieved   3.  Pt will report at least a 25% improvement in pain and sx's overall.  Baseline:  Goal status: achieved    LONG TERM GOALS: Target date: 01/19/22  Pt will be able to cut up vegetables, prepare food, and cook without significant pain.  Baseline:  Goal status: partially met- improved tolerance but can still get some pain  2.  Pt will be able to ambulate extended community distance without significant pain or limitation. Baseline:  Goal status: achieved  3.  Pt will report at least a 70% improvement in pain and sx's toward the end of the day.  Baseline:  Goal status: ongoing  4.  Pt will report she is able to perform her normal standing activities including household chores without significant pain or difficulty.  Baseline:  Goal status: ongoing    PLAN: PT FREQUENCY: 1-2x/week  PT DURATION: 6 weeks  PLANNED INTERVENTIONS: Therapeutic exercises, Therapeutic activity, Neuromuscular re-education, Balance training, Gait training, Patient/Family education, Joint mobilization, Stair training,  Aquatic Therapy, Dry Needling, Electrical stimulation, Cryotherapy, Moist heat, Ultrasound, and Manual therapy.  PLAN FOR NEXT SESSION: CONT MOBS PRN, progress periscap stab.   Lisa-Marie Rueger C. Damel Querry PT, DPT 12/26/21 12:00 PM

## 2021-12-26 ENCOUNTER — Encounter (HOSPITAL_BASED_OUTPATIENT_CLINIC_OR_DEPARTMENT_OTHER): Payer: Self-pay | Admitting: Physical Therapy

## 2021-12-29 ENCOUNTER — Encounter (HOSPITAL_BASED_OUTPATIENT_CLINIC_OR_DEPARTMENT_OTHER): Payer: Self-pay | Admitting: Physical Therapy

## 2021-12-29 ENCOUNTER — Ambulatory Visit (HOSPITAL_BASED_OUTPATIENT_CLINIC_OR_DEPARTMENT_OTHER): Payer: PPO | Admitting: Physical Therapy

## 2021-12-29 DIAGNOSIS — M546 Pain in thoracic spine: Secondary | ICD-10-CM

## 2021-12-29 DIAGNOSIS — M542 Cervicalgia: Secondary | ICD-10-CM | POA: Diagnosis not present

## 2021-12-29 NOTE — Therapy (Signed)
OUTPATIENT PHYSICAL THERAPY TREATMENT NOTE   Patient Name: Tonya [name] MRN: [id number] DOB:[birth year], 71 y.o., female Today's Date: 12/29/2021   PT End of Session - 12/29/21 1303     Visit Number 16    Number of Visits 22    Date for PT Re-Evaluation 01/19/22    Authorization Type HTA    PT Start Time 1302    PT Stop Time 9937    PT Time Calculation (min) 43 min    Activity Tolerance Patient tolerated treatment well    Behavior During Therapy WFL for tasks assessed/performed                     Past Medical History:  Diagnosis Date   Coronary atherosclerosis due to calcified coronary lesion    History of breast cancer    Hyperlipidemia    Hypertension    Osteopenia    Past Surgical History:  Procedure Laterality Date   ABDOMINAL HYSTERECTOMY  2015   AUGMENTATION MAMMAPLASTY Left    BREAST BIOPSY Right 02/2019   BREAST IMPLANT EXCHANGE     FOOT SURGERY Left 05/25/2020   MASTECTOMY Left    REDUCTION MAMMAPLASTY Right    TONSILLECTOMY AND ADENOIDECTOMY  1958   Patient Active Problem List   Diagnosis Date Noted   Coronary artery disease due to lipid rich plaque 09/30/2021   Hepatic steatosis 09/30/2021   Adenoma of left adrenal gland 09/30/2021   Strain of left trapezius muscle 09/30/2021   Hyperlipidemia 05/19/2021   Somatic dysfunction of spine, thoracic 03/26/2021   Chronic left-sided thoracic back pain 12/30/2020   DCIS (ductal carcinoma in situ) 01/26/2020   Low back pain 01/26/2020   Chronic pain of both knees 09/19/2019   Osteopenia    Estrogen deficiency 04/14/2018   Essential hypertension 01/10/2018   Gastroesophageal reflux disease 01/10/2018   Mild intermittent asthma without complication 16/96/7893   Pain in left lower leg 03/03/2017   Family history of colon cancer 02/23/2017   Herpes simplex vulvovaginitis 02/18/2017   Neuropathy 02/18/2017   Elevated liver enzymes 02/17/2017   Need for Streptococcus pneumoniae vaccination  02/17/2017   Chondromalacia of left patella 12/28/2016   Synovitis of left knee 12/28/2016   Seasonal allergic rhinitis due to fungal spores 09/25/2016   Bone mass 09/24/2016   Chronic hip pain 09/24/2016   Dark yellow-colored urine 09/24/2016   Low oxygen saturation 09/24/2016   Other specified postprocedural states 01/17/2015   Abnormal stress electrocardiogram test 10/20/2013   Malignant neoplasm of female breast ([place]) 12/15/2010   Breast mass 12/11/2010   Family history of malignant neoplasm of breast 12/11/2010    PCP: [name], DO  REFERRING PROVIDER: Lyndal Pulley, DO   REFERRING DIAG: [phone] (ICD-10-CM) - Chronic bilateral low back pain without sciatica  THERAPY DIAG:  Cervicalgia  Pain in thoracic spine  Joint stiffness in cervical  ONSET DATE: 10/09/2021 MD order  SUBJECTIVE:  SUBJECTIVE STATEMENT: I can go longer in my normal day with lift and my hip doesn't hurt other than a little bit after doing a lot. The pain happens when I am standing a lot- had a tag sale on Saturday and was standing for 5 hours.   PERTINENT HISTORY:  CAD, Hx of Breast CA (in remission for 12-13 years), Osteopenia  PAIN:  PAIN:  Are you having pain? Yes: NPRS scale: 0/10 Pain location:  pain on right side of neck, tightness on Left and burning ad midline C7 Pain description: dull-achey Aggravating factors: night pain Relieving factors: laying down    PRECAUTIONS: Other: osteopenia  WEIGHT BEARING RESTRICTIONS No  FALLS:  Has patient fallen in last 6 months? No   OCCUPATION: Pt is retired  PLOF: Independent; Pt was able to perform ADLs/IADLs and mobility with less pain.   PATIENT GOALS to be pain free in cervical and thoracic, increase walking without pain, to be able  cook with less pain, to be able to get on the floor and play with grandchildren   OBJECTIVE:   6/29 BP 119/72    pulse 67 bpm  6/29 Leg length: superior tibial tuberosity to inf med maleolus: Rt 34.5 cm, Lt 35.5 cm  DIAGNOSTIC FINDINGS:  Thoracic MRI IMPRESSION: 1. No acute osseous abnormality of the thoracic spine. 2. Mild multilevel thoracic spondylosis with scattered noncompressive disc protrusions. No foraminal or canal stenosis at any level.  POSTURE  6/9: Rib cage rotation to the left when pelvis is center Right shoulder depressed and anteriorly roted with slight trunk sidebend right  6/13: Rt cervical rotation 38, Lt 48  6/20: cervical SB Rt 20, Lt 18 Rotation 46 bilaterally  6/29- neutral spinal alignment in kneeling and seated; standing = lumbar levoscoliosis and thoracic dextroscoliosis  TODAY'S TREATMENT   7/10: MANUAL: STM to bil upper traps, levator, suboccipitals; lateral cervical glides bilat with associated sidebend passively Anchored band: rows, ER, lat pulls, triceps- tactile cues required for all  7/6: MANUAL: supine STM to bil upper traps, suboccipitals, Rt scalenes Y lift off at wall Sidelying: open book, clam, hip circles    6/29: Qped cat/cow Qped core contraction Child pose- midline & hands to right to stretch left side Standing hip hinge with shoe on right, no shoe on left Rt clam x20 Rt hip abd with small circles    PATIENT EDUCATION:  Education details: POC, relevant anatomy, exercise form, and rationale of exercises.   Person educated: Patient Education method: Explanation Education comprehension: verbalized understanding and needs further education   HOME EXERCISE PROGRAM: [id number]  ASSESSMENT:  CLINICAL IMPRESSION: Hypertrophy in Rt upper trap vs Lt. S/s of fatigue and discomfort consistent with fatigue due to lack of endurance with alterations of length/tension relationship along biomechanical chain.    OBJECTIVE  IMPAIRMENTS decreased activity tolerance, decreased endurance, decreased mobility, difficulty walking, decreased ROM, decreased strength, hypomobility, increased fascial restrictions, impaired flexibility, and pain.   ACTIVITY LIMITATIONS cleaning, community activity, meal prep, shopping, and yard work.   PERSONAL FACTORS Time since onset of injury/illness/exacerbation and 1 comorbidity: osteopenia  are also affecting patient's functional outcome.    REHAB POTENTIAL: Good  CLINICAL DECISION MAKING: Stable/uncomplicated  EVALUATION COMPLEXITY: Low   GOALS:   SHORT TERM GOALS: Target date: 11/18/2021  Pt will be independent and compliant with HEP for improved pain, ROM, strength, and function.  Baseline: Goal status: achieved  2.  Pt will demo improved R thoracic rotation AROM to be [facility] and 10 deg  in bilat cervical Sb'ing AROM for improved mobility and stiffness. Baseline: cervical SB 22 deg bil Goal status:achieved   3.  Pt will report at least a 25% improvement in pain and sx's overall.  Baseline:  Goal status: achieved    LONG TERM GOALS: Target date: 01/19/22  Pt will be able to cut up vegetables, prepare food, and cook without significant pain.  Baseline:  Goal status: partially met- improved tolerance but can still get some pain  2.  Pt will be able to ambulate extended community distance without significant pain or limitation. Baseline:  Goal status: achieved  3.  Pt will report at least a 70% improvement in pain and sx's toward the end of the day.  Baseline:  Goal status: ongoing  4.  Pt will report she is able to perform her normal standing activities including household chores without significant pain or difficulty.  Baseline:  Goal status: ongoing    PLAN: PT FREQUENCY: 1-2x/week  PT DURATION: 6 weeks  PLANNED INTERVENTIONS: Therapeutic exercises, Therapeutic activity, Neuromuscular re-education, Balance training, Gait training, Patient/Family  education, Joint mobilization, Stair training, Aquatic Therapy, Dry Needling, Electrical stimulation, Cryotherapy, Moist heat, Ultrasound, and Manual therapy.  PLAN FOR NEXT SESSION: CONT MOBS PRN, progress periscap stab & endurance  Naimah Yingst C. Jahnaya Branscome PT, DPT 12/29/21 2:08 PM

## 2021-12-31 NOTE — Therapy (Signed)
OUTPATIENT PHYSICAL THERAPY TREATMENT NOTE   Patient Name: Tonya [name] MRN: [id number] DOB:Jun 04, 1951, 71 y.o., female Today's Date: 01/02/2022   PT End of Session - 01/01/22 1158     Visit Number 17    Number of Visits 22    Date for PT Re-Evaluation 01/19/22    Authorization Type HTA    PT Start Time 1153    PT Stop Time 1233    PT Time Calculation (min) 40 min    Activity Tolerance Patient tolerated treatment well    Behavior During Therapy WFL for tasks assessed/performed                Past Medical History:  Diagnosis Date   Coronary atherosclerosis due to calcified coronary lesion    History of breast cancer    Hyperlipidemia    Hypertension    Osteopenia    Past Surgical History:  Procedure Laterality Date   ABDOMINAL HYSTERECTOMY  2015   AUGMENTATION MAMMAPLASTY Left    BREAST BIOPSY Right 02/2019   BREAST IMPLANT EXCHANGE     FOOT SURGERY Left 05/25/2020   MASTECTOMY Left    REDUCTION MAMMAPLASTY Right    TONSILLECTOMY AND ADENOIDECTOMY  1958   Patient Active Problem List   Diagnosis Date Noted   Coronary artery disease due to lipid rich plaque 09/30/2021   Hepatic steatosis 09/30/2021   Adenoma of left adrenal gland 09/30/2021   Strain of left trapezius muscle 09/30/2021   Hyperlipidemia 05/19/2021   Somatic dysfunction of spine, thoracic 03/26/2021   Chronic left-sided thoracic back pain 12/30/2020   DCIS (ductal carcinoma in situ) 01/26/2020   Low back pain 01/26/2020   Chronic pain of both knees 09/19/2019   Osteopenia    Estrogen deficiency 04/14/2018   Essential hypertension 01/10/2018   Gastroesophageal reflux disease 01/10/2018   Mild intermittent asthma without complication 93/55/2174   Pain in left lower leg 03/03/2017   Family history of colon cancer 02/23/2017   Herpes simplex vulvovaginitis 02/18/2017   Neuropathy 02/18/2017   Elevated liver enzymes 02/17/2017   Need for Streptococcus pneumoniae vaccination 02/17/2017    Chondromalacia of left patella 12/28/2016   Synovitis of left knee 12/28/2016   Seasonal allergic rhinitis due to fungal spores 09/25/2016   Bone mass 09/24/2016   Chronic hip pain 09/24/2016   Dark yellow-colored urine 09/24/2016   Low oxygen saturation 09/24/2016   Other specified postprocedural states 01/17/2015   Abnormal stress electrocardiogram test 10/20/2013   Malignant neoplasm of female breast ([place]) 12/15/2010   Breast mass 12/11/2010   Family history of malignant neoplasm of breast 12/11/2010    PCP: [name], DO  REFERRING PROVIDER: Lyndal Pulley, DO   REFERRING DIAG: [phone] (ICD-10-CM) - Chronic bilateral low back pain without sciatica  THERAPY DIAG:  Pain in thoracic spine  Cervicalgia  Joint stiffness in cervical  ONSET DATE: 10/09/2021 MD order  SUBJECTIVE:  SUBJECTIVE STATEMENT: Pt states she had lateral R hip pain.  Pt saw MD who took x rays and informed pt of LLD.  Pt reports she has been wearing 1/4 inch heel lift in R shoe for about 1 to 1.5 weeks.  Pt reports her hip feels better when using the heel lift.  She didn't use the heel lift yesterday and and had post hip pain.  Pt denies any adverse effects after prior Rx.  She continues to have her worst pain at the end of the day.  Pt is able to prepare foot and cut vegetables with hardly any pain.  Pt states she doesn't have the sharp nerve pain at C7 much anymore, possibly 1x/wk.  Pt states when she first started her L sided thoracic area bothered her though now she feels her R side is bothering her.  Pt also states cervical rotation was worse to the Left but now hurts going to the R.   Pt denies any adverse effects after prior Rx.   PERTINENT HISTORY:  CAD, Hx of Breast CA (in remission for 12-13 years),  Osteopenia  PAIN:  PAIN:  Are you having pain? Yes: NPRS scale: 1/10 Pain location:  mid thoracic, cervical, bilat UT Pain description: dull-achey Aggravating factors: night pain Relieving factors: laying down    PRECAUTIONS: Other: osteopenia  WEIGHT BEARING RESTRICTIONS No  FALLS:  Has patient fallen in last 6 months? No   OCCUPATION: Pt is retired  PLOF: Independent; Pt was able to perform ADLs/IADLs and mobility with less pain.   PATIENT GOALS to be pain free in cervical and thoracic, increase walking without pain, to be able cook with less pain, to be able to get on the floor and play with grandchildren   OBJECTIVE:   6/29 BP 119/72    pulse 67 bpm  6/29 Leg length: superior tibial tuberosity to inf med maleolus: Rt 34.5 cm, Lt 35.5 cm  DIAGNOSTIC FINDINGS:  Thoracic MRI IMPRESSION: 1. No acute osseous abnormality of the thoracic spine. 2. Mild multilevel thoracic spondylosis with scattered noncompressive disc protrusions. No foraminal or canal stenosis at any level.  POSTURE  6/9: Rib cage rotation to the left when pelvis is center Right shoulder depressed and anteriorly roted with slight trunk sidebend right  6/13: Rt cervical rotation 38, Lt 48  6/20: cervical SB Rt 20, Lt 18 Rotation 46 bilaterally  6/29- neutral spinal alignment in kneeling and seated; standing = lumbar levoscoliosis and thoracic dextroscoliosis  TODAY'S TREATMENT   Therapeutic Exercise:  -Reviewed current function, pain level, and response to prior Rx.  -Pt performed  UBE L2 x 2 min  Y lift off at wall  Sidelying: open book R S/L x 10 reps and L S/L approx 5 reps  Qped cat/cow  Standing Theraband with GTB:  rows, ER, and lat pulls all 2x10 reps  Supine manual UT stretch 3x20-30 sec bilat  Manual Therapy: Pt received STM to bil upper traps, levator and medial scap mm seated to improve soft tissue tightness, pain, and mobility.    PATIENT EDUCATION:  Education details:  POC, relevant anatomy, exercise form, and rationale of exercises.   Person educated: Patient Education method: Explanation Education comprehension: verbalized understanding and needs further education   HOME EXERCISE PROGRAM: [id number]  ASSESSMENT:  CLINICAL IMPRESSION: Pt is improving with pain and function as evidenced by subjective reports.  She reports being able to prepare foot and cut vegetables with hardly any pain and is not having the sharp  nerve pain at C7 much anymore.  Pt requires cuing and instruction to perform exercises with correct form.  Pt stopped open book in L S/L'ing due to cervical pain.  Pt had no cervical pain with open book in R S/L'ing.  Pt able to perform open book in L S/L'ing without pain after UT stretchting and MT.  Pt has reduced soft tissue tightness in bilat UT and medial scap mm.  Pt responded well to Rx having no increased pain stating she feels much better after Rx.   OBJECTIVE IMPAIRMENTS decreased activity tolerance, decreased endurance, decreased mobility, difficulty walking, decreased ROM, decreased strength, hypomobility, increased fascial restrictions, impaired flexibility, and pain.   ACTIVITY LIMITATIONS cleaning, community activity, meal prep, shopping, and yard work.   PERSONAL FACTORS Time since onset of injury/illness/exacerbation and 1 comorbidity: osteopenia  are also affecting patient's functional outcome.    REHAB POTENTIAL: Good  CLINICAL DECISION MAKING: Stable/uncomplicated  EVALUATION COMPLEXITY: Low   GOALS:   SHORT TERM GOALS: Target date: 11/18/2021  Pt will be independent and compliant with HEP for improved pain, ROM, strength, and function.  Baseline: Goal status: achieved  2.  Pt will demo improved R thoracic rotation AROM to be [facility] and 10 deg in bilat cervical Sb'ing AROM for improved mobility and stiffness. Baseline: cervical SB 22 deg bil Goal status:achieved   3.  Pt will report at least a 25% improvement in  pain and sx's overall.  Baseline:  Goal status: achieved    LONG TERM GOALS: Target date: 01/19/22  Pt will be able to cut up vegetables, prepare food, and cook without significant pain.  Baseline:  Goal status: partially met- improved tolerance but can still get some pain  2.  Pt will be able to ambulate extended community distance without significant pain or limitation. Baseline:  Goal status: achieved  3.  Pt will report at least a 70% improvement in pain and sx's toward the end of the day.  Baseline:  Goal status: ongoing  4.  Pt will report she is able to perform her normal standing activities including household chores without significant pain or difficulty.  Baseline:  Goal status: ongoing    PLAN: PT FREQUENCY: 1-2x/week  PT DURATION: 6 weeks  PLANNED INTERVENTIONS: Therapeutic exercises, Therapeutic activity, Neuromuscular re-education, Balance training, Gait training, Patient/Family education, Joint mobilization, Stair training, Aquatic Therapy, Dry Needling, Electrical stimulation, Cryotherapy, Moist heat, Ultrasound, and Manual therapy.  PLAN FOR NEXT SESSION:   Cont with manual therapy, mobility, and progress periscap stab & endurance   Selinda Michaels III PT, DPT 01/02/22 10:07 AM

## 2022-01-01 ENCOUNTER — Encounter (HOSPITAL_BASED_OUTPATIENT_CLINIC_OR_DEPARTMENT_OTHER): Payer: Self-pay | Admitting: Physical Therapy

## 2022-01-01 ENCOUNTER — Ambulatory Visit (HOSPITAL_BASED_OUTPATIENT_CLINIC_OR_DEPARTMENT_OTHER): Payer: PPO | Admitting: Physical Therapy

## 2022-01-01 DIAGNOSIS — M542 Cervicalgia: Secondary | ICD-10-CM

## 2022-01-01 DIAGNOSIS — M546 Pain in thoracic spine: Secondary | ICD-10-CM

## 2022-01-05 ENCOUNTER — Encounter (HOSPITAL_BASED_OUTPATIENT_CLINIC_OR_DEPARTMENT_OTHER): Payer: Self-pay | Admitting: Physical Therapy

## 2022-01-05 ENCOUNTER — Ambulatory Visit (HOSPITAL_BASED_OUTPATIENT_CLINIC_OR_DEPARTMENT_OTHER): Payer: PPO | Admitting: Physical Therapy

## 2022-01-05 DIAGNOSIS — M546 Pain in thoracic spine: Secondary | ICD-10-CM

## 2022-01-05 DIAGNOSIS — M62838 Other muscle spasm: Secondary | ICD-10-CM

## 2022-01-05 DIAGNOSIS — M542 Cervicalgia: Secondary | ICD-10-CM

## 2022-01-05 NOTE — Therapy (Signed)
OUTPATIENT PHYSICAL THERAPY TREATMENT NOTE   Patient Name: Tonya [name] MRN: [id number] DOB:02-Jun-1951, 71 y.o., female Today's Date: 01/05/2022   PT End of Session - 01/05/22 1020     Visit Number 18    Number of Visits 22    Date for PT Re-Evaluation 01/19/22    Authorization Type HTA    PT Start Time 1018    PT Stop Time 1056    PT Time Calculation (min) 38 min    Activity Tolerance Patient tolerated treatment well    Behavior During Therapy WFL for tasks assessed/performed                Past Medical History:  Diagnosis Date   Coronary atherosclerosis due to calcified coronary lesion    History of breast cancer    Hyperlipidemia    Hypertension    Osteopenia    Past Surgical History:  Procedure Laterality Date   ABDOMINAL HYSTERECTOMY  2015   AUGMENTATION MAMMAPLASTY Left    BREAST BIOPSY Right 02/2019   BREAST IMPLANT EXCHANGE     FOOT SURGERY Left 05/25/2020   MASTECTOMY Left    REDUCTION MAMMAPLASTY Right    TONSILLECTOMY AND ADENOIDECTOMY  1958   Patient Active Problem List   Diagnosis Date Noted   Coronary artery disease due to lipid rich plaque 09/30/2021   Hepatic steatosis 09/30/2021   Adenoma of left adrenal gland 09/30/2021   Strain of left trapezius muscle 09/30/2021   Hyperlipidemia 05/19/2021   Somatic dysfunction of spine, thoracic 03/26/2021   Chronic left-sided thoracic back pain 12/30/2020   DCIS (ductal carcinoma in situ) 01/26/2020   Low back pain 01/26/2020   Chronic pain of both knees 09/19/2019   Osteopenia    Estrogen deficiency 04/14/2018   Essential hypertension 01/10/2018   Gastroesophageal reflux disease 01/10/2018   Mild intermittent asthma without complication 07/37/1062   Pain in left lower leg 03/03/2017   Family history of colon cancer 02/23/2017   Herpes simplex vulvovaginitis 02/18/2017   Neuropathy 02/18/2017   Elevated liver enzymes 02/17/2017   Need for Streptococcus pneumoniae vaccination 02/17/2017    Chondromalacia of left patella 12/28/2016   Synovitis of left knee 12/28/2016   Seasonal allergic rhinitis due to fungal spores 09/25/2016   Bone mass 09/24/2016   Chronic hip pain 09/24/2016   Dark yellow-colored urine 09/24/2016   Low oxygen saturation 09/24/2016   Other specified postprocedural states 01/17/2015   Abnormal stress electrocardiogram test 10/20/2013   Malignant neoplasm of female breast ([place]) 12/15/2010   Breast mass 12/11/2010   Family history of malignant neoplasm of breast 12/11/2010    PCP: [name], DO  REFERRING PROVIDER: Lyndal Pulley, DO   REFERRING DIAG: [phone] (ICD-10-CM) - Chronic bilateral low back pain without sciatica  THERAPY DIAG:  Pain in thoracic spine  Cervicalgia  Other muscle spasm  Joint stiffness in cervical  ONSET DATE: 10/09/2021 MD order  SUBJECTIVE:  SUBJECTIVE STATEMENT: Saturday dusted my whole house. It gets sore doing my exercises. Painful turning head and feeling the cord along the right side of my neck. The hip has been fine since the lift was added.   PERTINENT HISTORY:  CAD, Hx of Breast CA (in remission for 12-13 years), Osteopenia  PAIN:  PAIN:  Are you having pain? Yes: NPRS scale: 5-6/10 Pain location:  mid thoracic, cervical, bilat UT Pain description: dull-achey Aggravating factors: night pain Relieving factors: laying down    PRECAUTIONS: Other: osteopenia  WEIGHT BEARING RESTRICTIONS No  FALLS:  Has patient fallen in last 6 months? No   OCCUPATION: Pt is retired  PLOF: Independent; Pt was able to perform ADLs/IADLs and mobility with less pain.   PATIENT GOALS to be pain free in cervical and thoracic, increase walking without pain, to be able cook with less pain, to be able to get on the floor  and play with grandchildren   OBJECTIVE:   6/29 BP 119/72    pulse 67 bpm  6/29 Leg length: superior tibial tuberosity to inf med maleolus: Rt 34.5 cm, Lt 35.5 cm  DIAGNOSTIC FINDINGS:  Thoracic MRI IMPRESSION: 1. No acute osseous abnormality of the thoracic spine. 2. Mild multilevel thoracic spondylosis with scattered noncompressive disc protrusions. No foraminal or canal stenosis at any level.  POSTURE  6/9: Rib cage rotation to the left when pelvis is center Right shoulder depressed and anteriorly roted with slight trunk sidebend right  6/13: Rt cervical rotation 38, Lt 48  6/20: cervical SB Rt 20, Lt 18 Rotation 46 bilaterally  6/29- neutral spinal alignment in kneeling and seated; standing = lumbar levoscoliosis and thoracic dextroscoliosis  7/17: 44 deg cervical rotation bilaterally with pain upon turning to the Left (after dry needling)  TODAY'S TREATMENT   7/17:  Seated rotation with sheet- before and after needling  Trigger Point Dry Needling, Manual Therapy Treatment:  Initial or subsequent education regarding Trigger Point Dry Needling: Subsequent Did patient give consent to treatment with Trigger Point Dry Needling: Yes TPDN with skilled palpation and monitoring followed by STM to the following muscles: Rt upper trap  MANUAL STM: Rt periscap, Lt thoracic paraspinals. Supine distraction with cervical rotation, cervical sidebend with opening mobs     PATIENT EDUCATION:  Education details: POC, relevant anatomy, exercise form, and rationale of exercises.   Person educated: Patient Education method: Explanation Education comprehension: verbalized understanding and needs further education   HOME EXERCISE PROGRAM: [id number]  ASSESSMENT:  CLINICAL IMPRESSION: Pain today consistent with facet limitation and reduced with distraction and assisted rotation with sheet.   OBJECTIVE IMPAIRMENTS decreased activity tolerance, decreased endurance, decreased  mobility, difficulty walking, decreased ROM, decreased strength, hypomobility, increased fascial restrictions, impaired flexibility, and pain.   ACTIVITY LIMITATIONS cleaning, community activity, meal prep, shopping, and yard work.   PERSONAL FACTORS Time since onset of injury/illness/exacerbation and 1 comorbidity: osteopenia  are also affecting patient's functional outcome.    REHAB POTENTIAL: Good  CLINICAL DECISION MAKING: Stable/uncomplicated  EVALUATION COMPLEXITY: Low   GOALS:   SHORT TERM GOALS: Target date: 11/18/2021  Pt will be independent and compliant with HEP for improved pain, ROM, strength, and function.  Baseline: Goal status: achieved  2.  Pt will demo improved R thoracic rotation AROM to be [facility] and 10 deg in bilat cervical Sb'ing AROM for improved mobility and stiffness. Baseline: cervical SB 22 deg bil Goal status:achieved   3.  Pt will report at least a 25% improvement in pain  and sx's overall.  Baseline:  Goal status: achieved    LONG TERM GOALS: Target date: 01/19/22  Pt will be able to cut up vegetables, prepare food, and cook without significant pain.  Baseline:  Goal status: partially met- improved tolerance but can still get some pain  2.  Pt will be able to ambulate extended community distance without significant pain or limitation. Baseline:  Goal status: achieved  3.  Pt will report at least a 70% improvement in pain and sx's toward the end of the day.  Baseline:  Goal status: ongoing  4.  Pt will report she is able to perform her normal standing activities including household chores without significant pain or difficulty.  Baseline:  Goal status: ongoing    PLAN: PT FREQUENCY: 1-2x/week  PT DURATION: 6 weeks  PLANNED INTERVENTIONS: Therapeutic exercises, Therapeutic activity, Neuromuscular re-education, Balance training, Gait training, Patient/Family education, Joint mobilization, Stair training, Aquatic Therapy, Dry Needling,  Electrical stimulation, Cryotherapy, Moist heat, Ultrasound, and Manual therapy.  PLAN FOR NEXT SESSION:   recert/PN to consider POC extension   .Andreka Stucki C. Retha Bither PT, DPT 01/05/22 1:20 PM

## 2022-01-07 ENCOUNTER — Ambulatory Visit (HOSPITAL_BASED_OUTPATIENT_CLINIC_OR_DEPARTMENT_OTHER): Payer: PPO | Admitting: Physical Therapy

## 2022-01-07 ENCOUNTER — Other Ambulatory Visit: Payer: Self-pay

## 2022-01-07 ENCOUNTER — Encounter (HOSPITAL_BASED_OUTPATIENT_CLINIC_OR_DEPARTMENT_OTHER): Payer: Self-pay | Admitting: Physical Therapy

## 2022-01-07 DIAGNOSIS — M62838 Other muscle spasm: Secondary | ICD-10-CM

## 2022-01-07 DIAGNOSIS — M542 Cervicalgia: Secondary | ICD-10-CM | POA: Diagnosis not present

## 2022-01-07 DIAGNOSIS — M546 Pain in thoracic spine: Secondary | ICD-10-CM

## 2022-01-07 NOTE — Therapy (Signed)
OUTPATIENT PHYSICAL THERAPY TREATMENT NOTE   Patient Name: Tonya [name] MRN: [id number] DOB:03-14-1951, 71 y.o., female Today's Date: 01/07/2022   PT End of Session - 01/07/22 1055     Visit Number 19    Number of Visits 29    Date for PT Re-Evaluation 03/20/22    Authorization Type HTA    Progress Note Due on Visit 34    PT Start Time 1019    PT Stop Time 1100    PT Time Calculation (min) 41 min    Activity Tolerance Patient tolerated treatment well    Behavior During Therapy WFL for tasks assessed/performed                 Past Medical History:  Diagnosis Date   Coronary atherosclerosis due to calcified coronary lesion    History of breast cancer    Hyperlipidemia    Hypertension    Osteopenia    Past Surgical History:  Procedure Laterality Date   ABDOMINAL HYSTERECTOMY  2015   AUGMENTATION MAMMAPLASTY Left    BREAST BIOPSY Right 02/2019   BREAST IMPLANT EXCHANGE     FOOT SURGERY Left 05/25/2020   MASTECTOMY Left    REDUCTION MAMMAPLASTY Right    TONSILLECTOMY AND ADENOIDECTOMY  1958   Patient Active Problem List   Diagnosis Date Noted   Coronary artery disease due to lipid rich plaque 09/30/2021   Hepatic steatosis 09/30/2021   Adenoma of left adrenal gland 09/30/2021   Strain of left trapezius muscle 09/30/2021   Hyperlipidemia 05/19/2021   Somatic dysfunction of spine, thoracic 03/26/2021   Chronic left-sided thoracic back pain 12/30/2020   DCIS (ductal carcinoma in situ) 01/26/2020   Low back pain 01/26/2020   Chronic pain of both knees 09/19/2019   Osteopenia    Estrogen deficiency 04/14/2018   Essential hypertension 01/10/2018   Gastroesophageal reflux disease 01/10/2018   Mild intermittent asthma without complication 09/03/9456   Pain in left lower leg 03/03/2017   Family history of colon cancer 02/23/2017   Herpes simplex vulvovaginitis 02/18/2017   Neuropathy 02/18/2017   Elevated liver enzymes 02/17/2017   Need for Streptococcus  pneumoniae vaccination 02/17/2017   Chondromalacia of left patella 12/28/2016   Synovitis of left knee 12/28/2016   Seasonal allergic rhinitis due to fungal spores 09/25/2016   Bone mass 09/24/2016   Chronic hip pain 09/24/2016   Dark yellow-colored urine 09/24/2016   Low oxygen saturation 09/24/2016   Other specified postprocedural states 01/17/2015   Abnormal stress electrocardiogram test 10/20/2013   Malignant neoplasm of female breast ([place]) 12/15/2010   Breast mass 12/11/2010   Family history of malignant neoplasm of breast 12/11/2010    PCP: [name], DO  REFERRING PROVIDER: Lyndal Pulley, DO   REFERRING DIAG: [phone] (ICD-10-CM) - Chronic bilateral low back pain without sciatica  THERAPY DIAG:  Pain in thoracic spine  Cervicalgia  Other muscle spasm  Joint stiffness in cervical  ONSET DATE: 10/09/2021 MD order  SUBJECTIVE:  SUBJECTIVE STATEMENT: Rt shoulder blade (medial line) and upper trap were killing me starting at about 6pm last night. Woke up with it being very sore after a nap. This AM feels more soreness in upper trap and lower-mid back. I think the increase in neck pain coordinated with timing of cutting BP pills in half.   PERTINENT HISTORY:  CAD, Hx of Breast CA (in remission for 12-13 years), Osteopenia  PAIN:  PAIN:  Are you having pain? Yes: NPRS scale: 5-6/10 Pain location:  mid thoracic, cervical, bilat UT Pain description: sore Aggravating factors: fatigue by end of day, turning to look for cars- either way Relieving factors: laying down    PRECAUTIONS: Other: osteopenia  WEIGHT BEARING RESTRICTIONS No  FALLS:  Has patient fallen in last 6 months? No   OCCUPATION: Pt is retired  PLOF: Independent; Pt was able to perform ADLs/IADLs  and mobility with less pain.   PATIENT GOALS to be pain free in cervical and thoracic, increase walking without pain, to be able cook with less pain, to be able to get on the floor and play with grandchildren   OBJECTIVE:   6/29 BP 119/72    pulse 67 bpm 7/19 BP 116/75    pulse 66 bpm   6/29 Leg length: superior tibial tuberosity to inf med maleolus: Rt 34.5 cm, Lt 35.5 cm  DIAGNOSTIC FINDINGS:  Thoracic MRI IMPRESSION: 1. No acute osseous abnormality of the thoracic spine. 2. Mild multilevel thoracic spondylosis with scattered noncompressive disc protrusions. No foraminal or canal stenosis at any level.  POSTURE  6/9: Rib cage rotation to the left when pelvis is center Right shoulder depressed and anteriorly roted with slight trunk sidebend right  6/13: Rt cervical rotation 38, Lt 48  6/20: cervical SB Rt 20, Lt 18 Rotation 46 bilaterally  6/29- neutral spinal alignment in kneeling and seated; standing = lumbar levoscoliosis and thoracic dextroscoliosis  7/17: 44 deg cervical rotation bilaterally with pain upon turning to the Left (after dry needling)  7/19: Prone posture= elevation noted in Rt thoracic rib cage  Cervical motion: AROM / PROM  Rt rotation 42//54  Lt Rotation 42//50  Rt Sidebend 22//32  Lt Side bend 22//30    TODAY'S TREATMENT   7/19:  MANUAL: prone Lt rib cage mobilization  Lt sidelying- pillow under Lt lumbar, Lt UE flexed to rest head on- deep breathing for lengthening Rt lumbar/Lt thoracic musculature & spinal curve   Stayed in this position for PN discussions   7/17:  Seated rotation with sheet- before and after needling  Trigger Point Dry Needling, Manual Therapy Treatment:  Initial or subsequent education regarding Trigger Point Dry Needling: Subsequent Did patient give consent to treatment with Trigger Point Dry Needling: Yes TPDN with skilled palpation and monitoring followed by STM to the following muscles: Rt upper  trap  MANUAL STM: Rt periscap, Lt thoracic paraspinals. Supine distraction with cervical rotation, cervical sidebend with opening mobs     PATIENT EDUCATION:  Education details: POC, relevant anatomy, exercise form, and rationale of exercises.   Person educated: Patient Education method: Explanation Education comprehension: verbalized understanding and needs further education   HOME EXERCISE PROGRAM: [id number]  ASSESSMENT: Progress Note 01/07/22 Reporting Period 10/28/21 to 01/07/22  See note below for Objective Data and Assessment of Progress/Goals.      CLINICAL IMPRESSION: Progress note performed today. Pt BP was also doing very well. Incr PROM vs active indicates limitations by muscle spasm. Great portion of appt today was spent  discussing length-tension relationship of muscles with scoliosis creating symptoms and long term management. Encouraged her to lay supine as soon as she starts to feel discomfort to refresh quickly prior to feeling severe levels of pain. At this time we will begin transition to 1/week PT and adding in regular deep tissue therapeutic massages. Will benefit from further therapy to stabilize scoliotic curve in caudal muscle groups and core to balance out change from heel lift.   OBJECTIVE IMPAIRMENTS decreased activity tolerance, decreased endurance, decreased mobility, difficulty walking, decreased ROM, decreased strength, hypomobility, increased fascial restrictions, impaired flexibility, and pain.   ACTIVITY LIMITATIONS cleaning, community activity, meal prep, shopping, and yard work.   PERSONAL FACTORS Time since onset of injury/illness/exacerbation and 1 comorbidity: osteopenia  are also affecting patient's functional outcome.    REHAB POTENTIAL: Good  CLINICAL DECISION MAKING: Stable/uncomplicated  EVALUATION COMPLEXITY: Low   GOALS:   SHORT TERM GOALS: Target date: 11/18/2021  Pt will be independent and compliant with HEP for improved pain,  ROM, strength, and function.  Baseline: Goal status: achieved  2.  Pt will demo improved R thoracic rotation AROM to be [facility] and 10 deg in bilat cervical Sb'ing AROM for improved mobility and stiffness. Baseline: cervical SB 22 deg bil Goal status:achieved   3.  Pt will report at least a 25% improvement in pain and sx's overall.  Baseline:  Goal status: achieved    LONG TERM GOALS:   Pt will be able to cut up vegetables, prepare food, and cook without significant pain.  Baseline:  Goal status: partially met- It is much better, not constant like it used to be and not wrapping down and to the side. Probably about a 4/10 and keeps getting worse as I push through.   2.  Pt will be able to ambulate extended community distance without significant pain or limitation. Baseline:  Goal status: achieved  3.  Pt will report at least a 70% improvement in pain and sx's toward the end of the day.  Baseline:  Goal status: ongoing- hip is way better, still getting limiting pain around neck/shoulder.   4.  Pt will report she is able to perform her normal standing activities including household chores without significant pain or difficulty.  Baseline:  Goal status: ongoing- improved but noticed significant pain after having to dust her whole house    PLAN: PT FREQUENCY: 1-2x/week  PT DURATION: 10 weeks  PLANNED INTERVENTIONS: Therapeutic exercises, Therapeutic activity, Neuromuscular re-education, Balance training, Gait training, Patient/Family education, Joint mobilization, Stair training, Aquatic Therapy, Dry Needling, Electrical stimulation, Cryotherapy, Moist heat, Ultrasound, and Manual therapy.  PLAN FOR NEXT SESSION:   strengthen in Lt sidelying posture   .Ailin Rochford C. Quinlyn Tep PT, DPT 01/07/22 11:26 AM

## 2022-01-12 ENCOUNTER — Encounter (HOSPITAL_BASED_OUTPATIENT_CLINIC_OR_DEPARTMENT_OTHER): Payer: PPO | Admitting: Physical Therapy

## 2022-01-15 ENCOUNTER — Ambulatory Visit (HOSPITAL_BASED_OUTPATIENT_CLINIC_OR_DEPARTMENT_OTHER): Payer: PPO | Admitting: Physical Therapy

## 2022-01-15 ENCOUNTER — Encounter (HOSPITAL_BASED_OUTPATIENT_CLINIC_OR_DEPARTMENT_OTHER): Payer: Self-pay | Admitting: Physical Therapy

## 2022-01-15 DIAGNOSIS — M546 Pain in thoracic spine: Secondary | ICD-10-CM

## 2022-01-15 DIAGNOSIS — M542 Cervicalgia: Secondary | ICD-10-CM

## 2022-01-15 DIAGNOSIS — M62838 Other muscle spasm: Secondary | ICD-10-CM

## 2022-01-15 NOTE — Therapy (Signed)
OUTPATIENT PHYSICAL THERAPY TREATMENT NOTE   Patient Name: AYLEN STRADFORD MRN: [id number] DOB:1951-06-03, 71 y.o., female Today's Date: 01/15/2022   PT End of Session - 01/15/22 1016     Visit Number 20    Number of Visits 29    Date for PT Re-Evaluation 03/20/22    Authorization Type HTA    Progress Note Due on Visit 59    PT Start Time 1015    PT Stop Time 1103    PT Time Calculation (min) 48 min    Activity Tolerance Patient tolerated treatment well    Behavior During Therapy WFL for tasks assessed/performed                  Past Medical History:  Diagnosis Date   Coronary atherosclerosis due to calcified coronary lesion    History of breast cancer    Hyperlipidemia    Hypertension    Osteopenia    Past Surgical History:  Procedure Laterality Date   ABDOMINAL HYSTERECTOMY  2015   AUGMENTATION MAMMAPLASTY Left    BREAST BIOPSY Right 02/2019   BREAST IMPLANT EXCHANGE     FOOT SURGERY Left 05/25/2020   MASTECTOMY Left    REDUCTION MAMMAPLASTY Right    TONSILLECTOMY AND ADENOIDECTOMY  1958   Patient Active Problem List   Diagnosis Date Noted   Coronary artery disease due to lipid rich plaque 09/30/2021   Hepatic steatosis 09/30/2021   Adenoma of left adrenal gland 09/30/2021   Strain of left trapezius muscle 09/30/2021   Hyperlipidemia 05/19/2021   Somatic dysfunction of spine, thoracic 03/26/2021   Chronic left-sided thoracic back pain 12/30/2020   DCIS (ductal carcinoma in situ) 01/26/2020   Low back pain 01/26/2020   Chronic pain of both knees 09/19/2019   Osteopenia    Estrogen deficiency 04/14/2018   Essential hypertension 01/10/2018   Gastroesophageal reflux disease 01/10/2018   Mild intermittent asthma without complication 12/45/8099   Pain in left lower leg 03/03/2017   Family history of colon cancer 02/23/2017   Herpes simplex vulvovaginitis 02/18/2017   Neuropathy 02/18/2017   Elevated liver enzymes 02/17/2017   Need for Streptococcus  pneumoniae vaccination 02/17/2017   Chondromalacia of left patella 12/28/2016   Synovitis of left knee 12/28/2016   Seasonal allergic rhinitis due to fungal spores 09/25/2016   Bone mass 09/24/2016   Chronic hip pain 09/24/2016   Dark yellow-colored urine 09/24/2016   Low oxygen saturation 09/24/2016   Other specified postprocedural states 01/17/2015   Abnormal stress electrocardiogram test 10/20/2013   Malignant neoplasm of female breast ([place]) 12/15/2010   Breast mass 12/11/2010   Family history of malignant neoplasm of breast 12/11/2010    PCP: [name], DO  REFERRING PROVIDER: Lyndal Pulley, DO   REFERRING DIAG: [phone] (ICD-10-CM) - Chronic bilateral low back pain without sciatica  THERAPY DIAG:  Pain in thoracic spine  Cervicalgia  Other muscle spasm  Joint stiffness in cervical  ONSET DATE: 10/09/2021 MD order  SUBJECTIVE:  SUBJECTIVE STATEMENT: I have the blurry eyed vision and a dull HA. I was wearing my Rx sunglasses in the car and then took them off to come in here which resulted in the HA. The massage was really good but I was so miserable that night- my neck, back, shoulders were achey and a light headed feeling at the base of my skull. Now when I turn to the left I feel the discomfort on the left side of my neck.   PERTINENT HISTORY:  CAD, Hx of Breast CA (in remission for 12-13 years), Osteopenia  PAIN:  PAIN:  Are you having pain? Yes: NPRS scale: 0 at rest, 6 when turning left/10 Pain location:  Rt cervical region Pain description: sore, grabs quickly Aggravating factors: lt cervical rotation Relieving factors: laying down    PRECAUTIONS: Other: osteopenia  WEIGHT BEARING RESTRICTIONS No  FALLS:  Has patient fallen in last 6 months?  No   OCCUPATION: Pt is retired  PLOF: Independent; Pt was able to perform ADLs/IADLs and mobility with less pain.   PATIENT GOALS to be pain free in cervical and thoracic, increase walking without pain, to be able cook with less pain, to be able to get on the floor and play with grandchildren   OBJECTIVE:   6/29 BP 119/72    pulse 67 bpm 7/19 BP 116/75    pulse 66 bpm   6/29 Leg length: superior tibial tuberosity to inf med maleolus: Rt 34.5 cm, Lt 35.5 cm  DIAGNOSTIC FINDINGS:  Thoracic MRI IMPRESSION: 1. No acute osseous abnormality of the thoracic spine. 2. Mild multilevel thoracic spondylosis with scattered noncompressive disc protrusions. No foraminal or canal stenosis at any level.  POSTURE  6/9: Rib cage rotation to the left when pelvis is center Right shoulder depressed and anteriorly roted with slight trunk sidebend right  6/13: Rt cervical rotation 38, Lt 48  6/20: cervical SB Rt 20, Lt 18 Rotation 46 bilaterally  6/29- neutral spinal alignment in kneeling and seated; standing = lumbar levoscoliosis and thoracic dextroscoliosis  7/17: 44 deg cervical rotation bilaterally with pain upon turning to the Left (after dry needling)  7/19: Prone posture= elevation noted in Rt thoracic rib cage  Cervical motion: AROM / PROM 7/19 AROM 7/27  Rt rotation 42//54 46  Lt Rotation 42//50 50    Rt Sidebend 22//32   Lt Side bend 22//30     TODAY'S TREATMENT   7/27 Chin tucks  MANUAL: supine bil C0 on C1 mobs paired retraction&rotation, supine Lt first rib depression paired with cervical rotation  Chin tuck + cervical rotation First rib mobilization with sheet- performed bilaterally  STM to Rt levator at end of session  7/19:  MANUAL: prone Lt rib cage mobilization  Lt sidelying- pillow under Lt lumbar, Lt UE flexed to rest head on- deep breathing for lengthening Rt lumbar/Lt thoracic musculature & spinal curve   Stayed in this position for PN  discussions   7/17:  Seated rotation with sheet- before and after needling  Trigger Point Dry Needling, Manual Therapy Treatment:  Initial or subsequent education regarding Trigger Point Dry Needling: Subsequent Did patient give consent to treatment with Trigger Point Dry Needling: Yes TPDN with skilled palpation and monitoring followed by STM to the following muscles: Rt upper trap  MANUAL STM: Rt periscap, Lt thoracic paraspinals. Supine distraction with cervical rotation, cervical sidebend with opening mobs     PATIENT EDUCATION:  Education details: pillows, anatomy of condition, stress Person educated: Patient Education  method: Explanation Education comprehension: verbalized understanding and needs further education   HOME EXERCISE PROGRAM: [id number]  ASSESSMENT:  CLINICAL IMPRESSION: Improved ROM following manual mobilizations. Has limited joint mobility in Rt C0 on C1 and elevation in left first rib. Able to reduce these discomforts which resulted in spasm of Rt levator scap- reduced with manual therapy.   OBJECTIVE IMPAIRMENTS decreased activity tolerance, decreased endurance, decreased mobility, difficulty walking, decreased ROM, decreased strength, hypomobility, increased fascial restrictions, impaired flexibility, and pain.   ACTIVITY LIMITATIONS cleaning, community activity, meal prep, shopping, and yard work.   PERSONAL FACTORS Time since onset of injury/illness/exacerbation and 1 comorbidity: osteopenia  are also affecting patient's functional outcome.    REHAB POTENTIAL: Good  CLINICAL DECISION MAKING: Stable/uncomplicated  EVALUATION COMPLEXITY: Low   GOALS:   SHORT TERM GOALS: Target date: 11/18/2021  Pt will be independent and compliant with HEP for improved pain, ROM, strength, and function.  Baseline: Goal status: achieved  2.  Pt will demo improved R thoracic rotation AROM to be [facility] and 10 deg in bilat cervical Sb'ing AROM for improved mobility  and stiffness. Baseline: cervical SB 22 deg bil Goal status:achieved   3.  Pt will report at least a 25% improvement in pain and sx's overall.  Baseline:  Goal status: achieved    LONG TERM GOALS:   Pt will be able to cut up vegetables, prepare food, and cook without significant pain.  Baseline:  Goal status: partially met- It is much better, not constant like it used to be and not wrapping down and to the side. Probably about a 4/10 and keeps getting worse as I push through.   2.  Pt will be able to ambulate extended community distance without significant pain or limitation. Baseline:  Goal status: achieved  3.  Pt will report at least a 70% improvement in pain and sx's toward the end of the day.  Baseline:  Goal status: ongoing- hip is way better, still getting limiting pain around neck/shoulder.   4.  Pt will report she is able to perform her normal standing activities including household chores without significant pain or difficulty.  Baseline:  Goal status: ongoing- improved but noticed significant pain after having to dust her whole house    PLAN: PT FREQUENCY: 1-2x/week  PT DURATION: 10 weeks  PLANNED INTERVENTIONS: Therapeutic exercises, Therapeutic activity, Neuromuscular re-education, Balance training, Gait training, Patient/Family education, Joint mobilization, Stair training, Aquatic Therapy, Dry Needling, Electrical stimulation, Cryotherapy, Moist heat, Ultrasound, and Manual therapy.  PLAN FOR NEXT SESSION:   strengthen in Lt sidelying posture   .Keeon Zurn C. Charlise Giovanetti PT, DPT 01/15/22 11:35 AM

## 2022-01-19 ENCOUNTER — Encounter (HOSPITAL_BASED_OUTPATIENT_CLINIC_OR_DEPARTMENT_OTHER): Payer: PPO | Admitting: Physical Therapy

## 2022-01-21 ENCOUNTER — Ambulatory Visit (HOSPITAL_BASED_OUTPATIENT_CLINIC_OR_DEPARTMENT_OTHER): Payer: PPO | Attending: Family Medicine | Admitting: Physical Therapy

## 2022-01-21 ENCOUNTER — Encounter (HOSPITAL_BASED_OUTPATIENT_CLINIC_OR_DEPARTMENT_OTHER): Payer: Self-pay | Admitting: Physical Therapy

## 2022-01-21 DIAGNOSIS — M542 Cervicalgia: Secondary | ICD-10-CM | POA: Diagnosis not present

## 2022-01-21 DIAGNOSIS — M62838 Other muscle spasm: Secondary | ICD-10-CM | POA: Diagnosis not present

## 2022-01-21 DIAGNOSIS — M546 Pain in thoracic spine: Secondary | ICD-10-CM | POA: Insufficient documentation

## 2022-01-21 NOTE — Therapy (Signed)
OUTPATIENT PHYSICAL THERAPY TREATMENT NOTE   Patient Name: Tonya [name] MRN: [id number] DOB:06/04/1951, 71 y.o., female Today's Date: 01/21/2022   PT End of Session - 01/21/22 1439     Visit Number 21    Number of Visits 29    Date for PT Re-Evaluation 03/20/22    Authorization Type HTA    Progress Note Due on Visit 29    PT Start Time 1432    PT Stop Time 1513    PT Time Calculation (min) 41 min    Activity Tolerance Patient tolerated treatment well    Behavior During Therapy WFL for tasks assessed/performed                  Past Medical History:  Diagnosis Date   Coronary atherosclerosis due to calcified coronary lesion    History of breast cancer    Hyperlipidemia    Hypertension    Osteopenia    Past Surgical History:  Procedure Laterality Date   ABDOMINAL HYSTERECTOMY  2015   AUGMENTATION MAMMAPLASTY Left    BREAST BIOPSY Right 02/2019   BREAST IMPLANT EXCHANGE     FOOT SURGERY Left 05/25/2020   MASTECTOMY Left    REDUCTION MAMMAPLASTY Right    TONSILLECTOMY AND ADENOIDECTOMY  1958   Patient Active Problem List   Diagnosis Date Noted   Coronary artery disease due to lipid rich plaque 09/30/2021   Hepatic steatosis 09/30/2021   Adenoma of left adrenal gland 09/30/2021   Strain of left trapezius muscle 09/30/2021   Hyperlipidemia 05/19/2021   Somatic dysfunction of spine, thoracic 03/26/2021   Chronic left-sided thoracic back pain 12/30/2020   DCIS (ductal carcinoma in situ) 01/26/2020   Low back pain 01/26/2020   Chronic pain of both knees 09/19/2019   Osteopenia    Estrogen deficiency 04/14/2018   Essential hypertension 01/10/2018   Gastroesophageal reflux disease 01/10/2018   Mild intermittent asthma without complication 01/10/2018   Pain in left lower leg 03/03/2017   Family history of colon cancer 02/23/2017   Herpes simplex vulvovaginitis 02/18/2017   Neuropathy 02/18/2017   Elevated liver enzymes 02/17/2017   Need for Streptococcus  pneumoniae vaccination 02/17/2017   Chondromalacia of left patella 12/28/2016   Synovitis of left knee 12/28/2016   Seasonal allergic rhinitis due to fungal spores 09/25/2016   Bone mass 09/24/2016   Chronic hip pain 09/24/2016   Dark yellow-colored urine 09/24/2016   Low oxygen saturation 09/24/2016   Other specified postprocedural states 01/17/2015   Abnormal stress electrocardiogram test 10/20/2013   Malignant neoplasm of female breast (HCC) 12/15/2010   Breast mass 12/11/2010   Family history of malignant neoplasm of breast 12/11/2010    PCP: Wendling, Nicholas Paul, DO  REFERRING PROVIDER: Smith, Zachary M, DO   REFERRING DIAG: M54.50,G89.29 (ICD-10-CM) - Chronic bilateral low back pain without sciatica  THERAPY DIAG:  Pain in thoracic spine  Joint stiffness in cervical  ONSET DATE: 10/09/2021 MD order  SUBJECTIVE:                                                                                                                                                                                             SUBJECTIVE STATEMENT: Something popped around my Lt shouder blade and my neck feels better. A little soreness in rt upper trap when looking to the right. Center of low back hurts after standing to talk to my neighbor in just my socks.    PERTINENT HISTORY:  CAD, Hx of Breast CA (in remission for 12-13 years), Osteopenia  PAIN:  Are you having pain? Yes: NPRS scale: 0 at rest, 6 when turning left/10 Pain location:  Rt cervical region Pain description: sore, grabs quickly Aggravating factors: lt cervical rotation Relieving factors: laying down    PRECAUTIONS: Other: osteopenia  WEIGHT BEARING RESTRICTIONS No  FALLS:  Has patient fallen in last 6 months? No   OCCUPATION: Pt is retired  PLOF: Independent; Pt was able to perform ADLs/IADLs and mobility with less pain.   PATIENT GOALS to be pain free in cervical and thoracic, increase walking without pain, to be able  cook with less pain, to be able to get on the floor and play with grandchildren   OBJECTIVE:   6/29 BP 119/72    pulse 67 bpm 7/19 BP 116/75    pulse 66 bpm   6/29 Leg length: superior tibial tuberosity to inf med maleolus: Rt 34.5 cm, Lt 35.5 cm  DIAGNOSTIC FINDINGS:  Thoracic MRI IMPRESSION: 1. No acute osseous abnormality of the thoracic spine. 2. Mild multilevel thoracic spondylosis with scattered noncompressive disc protrusions. No foraminal or canal stenosis at any level.  POSTURE  6/9: Rib cage rotation to the left when pelvis is center Right shoulder depressed and anteriorly roted with slight trunk sidebend right  6/13: Rt cervical rotation 38, Lt 48  6/20: cervical SB Rt 20, Lt 18 Rotation 46 bilaterally  6/29- neutral spinal alignment in kneeling and seated; standing = lumbar levoscoliosis and thoracic dextroscoliosis  7/17: 44 deg cervical rotation bilaterally with pain upon turning to the Left (after dry needling)  7/19: Prone posture= elevation noted in Rt thoracic rib cage  Cervical motion: AROM / PROM 7/19 AROM 7/27  Rt rotation 42//54 46  Lt Rotation 42//50 50    Rt Sidebend 22//32   Lt Side bend 22//30     TODAY'S TREATMENT   01/21/22  Supine LTR, single knee to chest with 2 breaths Hooklying ab set, added adduction Ab set+ball squeeze+glut set  Unable to lift into bridge due to increase in LBP Supine ab set + marching; also with extended legs & feet in DF Iso hamstring curl- resting with ball to Lt- PT assist to obtain midline Physioball roll in/out with visual cues for midline placement.   Neutral & in LE turnout    PATIENT EDUCATION:  Education details: pillows, anatomy of condition, stress Person educated: Patient Education method: Explanation Education comprehension: verbalized understanding and needs further education   HOME EXERCISE PROGRAM: [id number]  ASSESSMENT:  CLINICAL IMPRESSION: Notable limitation in coordinated  contraction of gluts and dominance to lumbar extensors for bridges.   OBJECTIVE IMPAIRMENTS decreased activity tolerance, decreased endurance, decreased mobility, difficulty walking, decreased ROM, decreased strength, hypomobility, increased fascial restrictions, impaired flexibility, and pain.   ACTIVITY LIMITATIONS cleaning, community activity, meal prep, shopping, and yard work.   PERSONAL FACTORS Time since onset of injury/illness/exacerbation and 1 comorbidity: osteopenia  are also affecting patient's functional outcome.    REHAB POTENTIAL: Good  CLINICAL DECISION MAKING: Stable/uncomplicated  EVALUATION COMPLEXITY: Low   GOALS:   SHORT TERM GOALS: Target date: 11/18/2021  Pt will be independent and compliant with HEP for improved pain, ROM, strength,  and function.  Baseline: Goal status: achieved  2.  Pt will demo improved R thoracic rotation AROM to be WFL and 10 deg in bilat cervical Sb'ing AROM for improved mobility and stiffness. Baseline: cervical SB 22 deg bil Goal status:achieved   3.  Pt will report at least a 25% improvement in pain and sx's overall.  Baseline:  Goal status: achieved    LONG TERM GOALS:   Pt will be able to cut up vegetables, prepare food, and cook without significant pain.  Baseline:  Goal status: partially met- It is much better, not constant like it used to be and not wrapping down and to the side. Probably about a 4/10 and keeps getting worse as I push through.   2.  Pt will be able to ambulate extended community distance without significant pain or limitation. Baseline:  Goal status: achieved  3.  Pt will report at least a 70% improvement in pain and sx's toward the end of the day.  Baseline:  Goal status: ongoing- hip is way better, still getting limiting pain around neck/shoulder.   4.  Pt will report she is able to perform her normal standing activities including household chores without significant pain or difficulty.   Baseline:  Goal status: ongoing- improved but noticed significant pain after having to dust her whole house    PLAN: PT FREQUENCY: 1-2x/week  PT DURATION: 10 weeks  PLANNED INTERVENTIONS: Therapeutic exercises, Therapeutic activity, Neuromuscular re-education, Balance training, Gait training, Patient/Family education, Joint mobilization, Stair training, Aquatic Therapy, Dry Needling, Electrical stimulation, Cryotherapy, Moist heat, Ultrasound, and Manual therapy.  PLAN FOR NEXT SESSION:   cont core strength   . C.  PT, DPT 01/21/22 3:13 PM 

## 2022-01-22 ENCOUNTER — Ambulatory Visit: Payer: PPO | Admitting: Family Medicine

## 2022-01-25 NOTE — Therapy (Signed)
OUTPATIENT PHYSICAL THERAPY TREATMENT NOTE   Patient Name: Tonya [name] MRN: [id number] DOB:12-17-1950, 71 y.o., female Today's Date: 01/26/2022   PT End of Session - 01/26/22 1346     Visit Number 22    Number of Visits 29    Date for PT Re-Evaluation 03/20/22    Authorization Type HTA    Progress Note Due on Visit 33    PT Start Time 1345    PT Stop Time 1430    PT Time Calculation (min) 45 min    Activity Tolerance Patient tolerated treatment well    Behavior During Therapy WFL for tasks assessed/performed                   Past Medical History:  Diagnosis Date   Coronary atherosclerosis due to calcified coronary lesion    History of breast cancer    Hyperlipidemia    Hypertension    Osteopenia    Past Surgical History:  Procedure Laterality Date   ABDOMINAL HYSTERECTOMY  2015   AUGMENTATION MAMMAPLASTY Left    BREAST BIOPSY Right 02/2019   BREAST IMPLANT EXCHANGE     FOOT SURGERY Left 05/25/2020   MASTECTOMY Left    REDUCTION MAMMAPLASTY Right    TONSILLECTOMY AND ADENOIDECTOMY  1958   Patient Active Problem List   Diagnosis Date Noted   Coronary artery disease due to lipid rich plaque 09/30/2021   Hepatic steatosis 09/30/2021   Adenoma of left adrenal gland 09/30/2021   Strain of left trapezius muscle 09/30/2021   Hyperlipidemia 05/19/2021   Somatic dysfunction of spine, thoracic 03/26/2021   Chronic left-sided thoracic back pain 12/30/2020   DCIS (ductal carcinoma in situ) 01/26/2020   Low back pain 01/26/2020   Chronic pain of both knees 09/19/2019   Osteopenia    Estrogen deficiency 04/14/2018   Essential hypertension 01/10/2018   Gastroesophageal reflux disease 01/10/2018   Mild intermittent asthma without complication 56/81/2751   Pain in left lower leg 03/03/2017   Family history of colon cancer 02/23/2017   Herpes simplex vulvovaginitis 02/18/2017   Neuropathy 02/18/2017   Elevated liver enzymes 02/17/2017   Need for Streptococcus  pneumoniae vaccination 02/17/2017   Chondromalacia of left patella 12/28/2016   Synovitis of left knee 12/28/2016   Seasonal allergic rhinitis due to fungal spores 09/25/2016   Bone mass 09/24/2016   Chronic hip pain 09/24/2016   Dark yellow-colored urine 09/24/2016   Low oxygen saturation 09/24/2016   Other specified postprocedural states 01/17/2015   Abnormal stress electrocardiogram test 10/20/2013   Malignant neoplasm of female breast ([place]) 12/15/2010   Breast mass 12/11/2010   Family history of malignant neoplasm of breast 12/11/2010    PCP: [name], DO  REFERRING PROVIDER: Lyndal Pulley, DO   REFERRING DIAG: [phone] (ICD-10-CM) - Chronic bilateral low back pain without sciatica  THERAPY DIAG:  Pain in thoracic spine  Cervicalgia  Other muscle spasm  Joint stiffness in cervical  ONSET DATE: 10/09/2021 MD order  SUBJECTIVE:  SUBJECTIVE STATEMENT: Neck and back is fine but hte left side of my neck is still doing the pinchy/burny thing. I have a massage on Wed with Leanna.   PERTINENT HISTORY:  CAD, Hx of Breast CA (in remission for 12-13 years), Osteopenia  PAIN:  Are you having pain? Yes: NPRS scale: 0 at rest, 6 when turning left/10 Pain location:  Rt cervical region Pain description: sore, grabs quickly Aggravating factors: lt cervical rotation Relieving factors: laying down    PRECAUTIONS: Other: osteopenia  WEIGHT BEARING RESTRICTIONS No  FALLS:  Has patient fallen in last 6 months? No   OCCUPATION: Pt is retired  PLOF: Independent; Pt was able to perform ADLs/IADLs and mobility with less pain.   PATIENT GOALS to be pain free in cervical and thoracic, increase walking without pain, to be able cook with less pain, to be able to get on the floor  and play with grandchildren   OBJECTIVE:   6/29 BP 119/72    pulse 67 bpm 7/19 BP 116/75    pulse 66 bpm   6/29 Leg length: superior tibial tuberosity to inf med maleolus: Rt 34.5 cm, Lt 35.5 cm  DIAGNOSTIC FINDINGS:  Thoracic MRI IMPRESSION: 1. No acute osseous abnormality of the thoracic spine. 2. Mild multilevel thoracic spondylosis with scattered noncompressive disc protrusions. No foraminal or canal stenosis at any level.  POSTURE  6/9: Rib cage rotation to the left when pelvis is center Right shoulder depressed and anteriorly roted with slight trunk sidebend right  6/13: Rt cervical rotation 38, Lt 48  6/20: cervical SB Rt 20, Lt 18 Rotation 46 bilaterally  6/29- neutral spinal alignment in kneeling and seated; standing = lumbar levoscoliosis and thoracic dextroscoliosis  7/17: 44 deg cervical rotation bilaterally with pain upon turning to the Left (after dry needling)  7/19: Prone posture= elevation noted in Rt thoracic rib cage  Cervical motion: AROM / PROM 7/19 AROM 7/27  Rt rotation 42//54 46  Lt Rotation 42//50 50    Rt Sidebend 22//32   Lt Side bend 22//30     TODAY'S TREATMENT   Treatment                            8/7:   MANUAL:  Supine STM bil upper traps Supine cervical traction Seated cervical rotation with assisted vertebral rotation  Seated first rib mobilization IASTM Rt upper trap     01/21/22  Supine LTR, single knee to chest with 2 breaths Hooklying ab set, added adduction Ab set+ball squeeze+glut set  Unable to lift into bridge due to increase in LBP Supine ab set + marching; also with extended legs & feet in DF Iso hamstring curl- resting with ball to Lt- PT assist to obtain midline Physioball roll in/out with visual cues for midline placement.   Neutral & in LE turnout    PATIENT EDUCATION:  Education details: pillows, anatomy of condition, stress Person educated: Patient Education method: Explanation Education  comprehension: verbalized understanding and needs further education   HOME EXERCISE PROGRAM: [id number]  ASSESSMENT:  CLINICAL IMPRESSION: Improving cervical rotation and reduced Rt shoulder elevation with rt cervical rotation.  Able to reduce pinching as she did not add extension to rotation.      OBJECTIVE IMPAIRMENTS decreased activity tolerance, decreased endurance, decreased mobility, difficulty walking, decreased ROM, decreased strength, hypomobility, increased fascial restrictions, impaired flexibility, and pain.   ACTIVITY LIMITATIONS cleaning, community activity, meal prep, shopping, and yard work.  PERSONAL FACTORS Time since onset of injury/illness/exacerbation and 1 comorbidity: osteopenia  are also affecting patient's functional outcome.    REHAB POTENTIAL: Good  CLINICAL DECISION MAKING: Stable/uncomplicated  EVALUATION COMPLEXITY: Low   GOALS:   SHORT TERM GOALS: Target date: 11/18/2021  Pt will be independent and compliant with HEP for improved pain, ROM, strength, and function.  Baseline: Goal status: achieved  2.  Pt will demo improved R thoracic rotation AROM to be [facility] and 10 deg in bilat cervical Sb'ing AROM for improved mobility and stiffness. Baseline: cervical SB 22 deg bil Goal status:achieved   3.  Pt will report at least a 25% improvement in pain and sx's overall.  Baseline:  Goal status: achieved    LONG TERM GOALS:   Pt will be able to cut up vegetables, prepare food, and cook without significant pain.  Baseline:  Goal status: partially met- It is much better, not constant like it used to be and not wrapping down and to the side. Probably about a 4/10 and keeps getting worse as I push through.   2.  Pt will be able to ambulate extended community distance without significant pain or limitation. Baseline:  Goal status: achieved  3.  Pt will report at least a 70% improvement in pain and sx's toward the end of the day.  Baseline:  Goal  status: ongoing- hip is way better, still getting limiting pain around neck/shoulder.   4.  Pt will report she is able to perform her normal standing activities including household chores without significant pain or difficulty.  Baseline:  Goal status: ongoing- improved but noticed significant pain after having to dust her whole house    PLAN: PT FREQUENCY: 1-2x/week  PT DURATION: 10 weeks  PLANNED INTERVENTIONS: Therapeutic exercises, Therapeutic activity, Neuromuscular re-education, Balance training, Gait training, Patient/Family education, Joint mobilization, Stair training, Aquatic Therapy, Dry Needling, Electrical stimulation, Cryotherapy, Moist heat, Ultrasound, and Manual therapy.  PLAN FOR NEXT SESSION:   cont core strength   .Dhanvin Szeto C. Naturi Alarid PT, DPT 01/26/22 3:55 PM

## 2022-01-26 ENCOUNTER — Encounter (HOSPITAL_BASED_OUTPATIENT_CLINIC_OR_DEPARTMENT_OTHER): Payer: Self-pay | Admitting: Physical Therapy

## 2022-01-26 ENCOUNTER — Ambulatory Visit (HOSPITAL_BASED_OUTPATIENT_CLINIC_OR_DEPARTMENT_OTHER): Payer: PPO | Admitting: Physical Therapy

## 2022-01-26 DIAGNOSIS — M62838 Other muscle spasm: Secondary | ICD-10-CM

## 2022-01-26 DIAGNOSIS — M546 Pain in thoracic spine: Secondary | ICD-10-CM

## 2022-01-26 DIAGNOSIS — M542 Cervicalgia: Secondary | ICD-10-CM

## 2022-01-27 ENCOUNTER — Encounter: Payer: Self-pay | Admitting: Family Medicine

## 2022-01-27 ENCOUNTER — Ambulatory Visit (INDEPENDENT_AMBULATORY_CARE_PROVIDER_SITE_OTHER): Payer: PPO | Admitting: Family Medicine

## 2022-01-27 VITALS — BP 118/78 | HR 63 | Temp 98.2°F | Ht 67.0 in | Wt 214.2 lb

## 2022-01-27 DIAGNOSIS — E7800 Pure hypercholesterolemia, unspecified: Secondary | ICD-10-CM

## 2022-01-27 DIAGNOSIS — K582 Mixed irritable bowel syndrome: Secondary | ICD-10-CM

## 2022-01-27 DIAGNOSIS — Z0000 Encounter for general adult medical examination without abnormal findings: Secondary | ICD-10-CM | POA: Diagnosis not present

## 2022-01-27 LAB — COMPREHENSIVE METABOLIC PANEL
ALT: 21 U/L (ref 0–35)
AST: 22 U/L (ref 0–37)
Albumin: 4.3 g/dL (ref 3.5–5.2)
Alkaline Phosphatase: 68 U/L (ref 39–117)
BUN: 15 mg/dL (ref 6–23)
CO2: 29 mEq/L (ref 19–32)
Calcium: 9.3 mg/dL (ref 8.4–10.5)
Chloride: 104 mEq/L (ref 96–112)
Creatinine, Ser: 0.78 mg/dL (ref 0.40–1.20)
GFR: 77.15 mL/min (ref >60.00)
Glucose, Bld: 88 mg/dL (ref 70–99)
Potassium: 4.3 mEq/L (ref 3.5–5.1)
Sodium: 141 mEq/L (ref 135–145)
Total Bilirubin: 1 mg/dL (ref 0.2–1.2)
Total Protein: 6.8 g/dL (ref 6.0–8.3)

## 2022-01-27 LAB — LIPID PANEL
Cholesterol: 157 mg/dL (ref 0–200)
HDL: 60.5 mg/dL (ref >39.00)
LDL Cholesterol: 78 mg/dL (ref 0–99)
NonHDL: 96.14
Total CHOL/HDL Ratio: 3
Triglycerides: 93 mg/dL (ref 0.0–149.0)
VLDL: 18.6 mg/dL (ref 0.0–40.0)

## 2022-01-27 NOTE — Patient Instructions (Addendum)
Give [place] 2-3 business days to get the results of your labs back.   Keep the diet clean and stay active.  Please get me a copy of your advanced directive form at your convenience.   I recommend getting the flu shot in mid October. This suggestion would change if the CDC comes out with a different recommendation.   Take Metamucil or Benefiber daily. Consider IB-Gard or concentrated peppermint if that does not help. There are medications we can use also.   Let us know if you need anything.

## 2022-01-27 NOTE — Progress Notes (Signed)
Chief Complaint  Patient presents with   Annual Exam    Shoulder/neck tightness Base of neck soreness Blurred vision Bowel movements back and forth.      Well Woman Tonya [name] is here for a complete physical.   Her last physical was >1 year ago.  Current diet: in general, a "healthy" diet. Current exercise: no routine exercise. Weight is intentionally down a few lbs and she denies daytime fatigue. Seatbelt? Yes Advanced directive? Yes  Health Maintenance Colonoscopy- Yes Shingrix- No DEXA- Yes Mammogram- Yes Tetanus- Yes Pneumonia- Yes Hep C screen- Yes  She has several months of alternating constipation and loose stools.  This happens within the span of a day.  She does have high stress levels.  Hydration helps slightly but does not fully remedy the issue.  She has no nausea, vomiting, or unintentional weight loss.  There is no bleeding or fevers.  She has not tried anything at home so far.  Her history of colonoscopies have been unremarkable.  Past Medical History:  Diagnosis Date   Coronary atherosclerosis due to calcified coronary lesion    History of breast cancer    Hyperlipidemia    Hypertension    Osteopenia      Past Surgical History:  Procedure Laterality Date   ABDOMINAL HYSTERECTOMY  2015   AUGMENTATION MAMMAPLASTY Left    BREAST BIOPSY Right 02/2019   BREAST IMPLANT EXCHANGE     FOOT SURGERY Left 05/25/2020   MASTECTOMY Left    REDUCTION MAMMAPLASTY Right    TONSILLECTOMY AND ADENOIDECTOMY  1958    Medications  Current Outpatient Medications on File Prior to Visit  Medication Sig Dispense Refill   aspirin EC 81 MG tablet Take 1 tablet (81 mg total) by mouth daily. Swallow whole. 30 tablet 11   atenolol (TENORMIN) 50 MG tablet      calcium-vitamin D (OSCAL WITH D) 250-125 MG-UNIT tablet Take 1 tablet by mouth daily.     montelukast (SINGULAIR) 10 MG tablet TAKE 1 TABLET BY MOUTH EVERYDAY AT BEDTIME 90 tablet 3   pantoprazole (PROTONIX) 20 MG  tablet Take 1 tablet (20 mg total) by mouth daily. 90 tablet 2   pravastatin (PRAVACHOL) 40 MG tablet TAKE 1 TABLET BY MOUTH EVERY DAY IN THE EVENING 90 tablet 0   No current facility-administered medications on file prior to visit.     Allergies Allergies  Allergen Reactions   Crestor [Rosuvastatin]     Myalgias    Lipitor [Atorvastatin]     myalgias    Review of Systems: Constitutional:  no fevers Eye:  no recent significant change in vision Ears:  No changes in hearing Nose/Mouth/Throat:  no complaints of nasal congestion, no sore throat Cardiovascular: no chest pain Respiratory:  No shortness of breath Gastrointestinal:  +changes in bowel habits GU:  Female: negative for dysuria Integumentary:  no abnormal skin lesions reported Neurologic:  no headaches Endocrine:  denies unexplained weight changes  Exam BP 118/78   Pulse 63   Temp 98.2 F (36.8 C) (Oral)   Ht '5\' 7"'$  (1.702 m)   Wt 214 lb 4 oz (97.2 kg)   SpO2 93%   BMI 33.56 kg/m  General:  well developed, well nourished, in no apparent distress Skin:  no significant moles, warts, or growths Head:  no masses, lesions, or tenderness Eyes:  pupils equal and round, sclera anicteric without injection Ears:  canals without lesions, TMs shiny without retraction, no obvious effusion, no erythema Nose:  nares  patent, septum midline, mucosa normal, and no drainage or sinus tenderness Throat/Pharynx:  lips and gingiva without lesion; tongue and uvula midline; non-inflamed pharynx; no exudates or postnasal drainage Neck: neck supple without adenopathy, thyromegaly, or masses Lungs:  clear to auscultation, breath sounds equal bilaterally, no respiratory distress Cardio:  regular rate and rhythm, no bruits or LE edema Abdomen:  abdomen soft, nontender; bowel sounds normal; no masses or organomegaly Genital: Deferred Neuro:  gait normal; deep tendon reflexes normal and symmetric Psych: well oriented with normal range of  affect and appropriate judgment/insight  Assessment and Plan  Well adult exam  Pure hypercholesterolemia - Plan: Comprehensive metabolic panel, Lipid panel  Irritable bowel syndrome with both constipation and diarrhea   Well 71 y.o. female. Counseled on diet and exercise. Advanced directive form requested today.  She will find her GI records hopefully from before so she can seamlessly transition to a new GI provider in this area. IBS: She likely has irritable bowel syndrome due to stress.  Supplement with Metamucil or Benefiber recommended.  Stay hydrated.  Consider IBgard which she has at home due to her husband purchasing but forgetting to use.  She will send me a message if no improvement and we will consider a low-dose of nortriptyline versus referral. Other orders as above. Follow up in 6 mo or prn. The patient voiced understanding and agreement to the plan.  [place], DO 01/27/22 12:42 PM

## 2022-02-05 ENCOUNTER — Encounter (HOSPITAL_BASED_OUTPATIENT_CLINIC_OR_DEPARTMENT_OTHER): Payer: PPO | Admitting: Physical Therapy

## 2022-02-05 ENCOUNTER — Encounter (HOSPITAL_BASED_OUTPATIENT_CLINIC_OR_DEPARTMENT_OTHER): Payer: Self-pay

## 2022-02-09 ENCOUNTER — Ambulatory Visit (INDEPENDENT_AMBULATORY_CARE_PROVIDER_SITE_OTHER): Payer: PPO | Admitting: Family Medicine

## 2022-02-09 ENCOUNTER — Encounter: Payer: Self-pay | Admitting: Family Medicine

## 2022-02-09 VITALS — BP 122/82 | HR 68 | Temp 98.2°F | Ht 67.0 in | Wt 212.2 lb

## 2022-02-09 DIAGNOSIS — H00012 Hordeolum externum right lower eyelid: Secondary | ICD-10-CM | POA: Diagnosis not present

## 2022-02-09 MED ORDER — ERYTHROMYCIN 5 MG/GM OP OINT: 1.0000 | TOPICAL_OINTMENT | Freq: Every day | OPHTHALMIC | 0 refills | Status: DC

## 2022-02-09 NOTE — Progress Notes (Signed)
Chief Complaint  Patient presents with   Eye Problem    Tonya [name] is here for right eye irritation.  Duration: 4 days Chemical exposure? No  Recent URI? No  Contact lenses? No  History of allergies? No  Treatment to date: warm compresses Slight redness on the L side.  Some itching on inside of both eyes.  No fevers, dc or vision changes.   Past Medical History:  Diagnosis Date   Coronary atherosclerosis due to calcified coronary lesion    History of breast cancer    Hyperlipidemia    Hypertension    Osteopenia    Family History  Problem Relation Age of Onset   Heart attack Mother    Heart attack Father        died from   Lung disease Brother    Cancer Brother    Breast cancer Maternal Aunt    Breast cancer Paternal Aunt     BP 122/82   Pulse 68   Temp 98.2 F (36.8 C) (Oral)   Ht '5\' 7"'$  (1.702 m)   Wt 212 lb 4 oz (96.3 kg)   SpO2 99%   BMI 33.24 kg/m  Gen: Awake, alert, appears stated age Eyes: Lower lids injected on the right compared to the left, slightly raised circular lesion on the lower lid with the point of tenderness is, no drainage, sclera white bilaterally, PERRLA, EOMi, no pain over closed lids palpating the globes Nose: Nares patent without discharge Mouth: MMM, pharynx without erythema or exudate Psych: Age appropriate judgment and insight; mood and affect normal  Hordeolum of right lower eyelid, unspecified hordeolum type - Plan: erythromycin ophthalmic ointment  Erythematous appointment. Instructed to practice good hand hygiene and try not to touch face. Warm compresses and artificial tears also recommended. F/u if no improvement. Pt voiced understanding and agreement to the plan.  [place], DO 02/09/22 3:03 PM

## 2022-02-09 NOTE — Patient Instructions (Signed)
Keep using warm compresses. Massage upwards to the eyelashes.   Artificial tears like Refresh and Systane may be used for comfort. OK to get generic version. Generally people use them every 2-4 hours, but you can use them as much as you want because there is no medication in it.  Let us know if you need anything.

## 2022-02-10 ENCOUNTER — Encounter (HOSPITAL_BASED_OUTPATIENT_CLINIC_OR_DEPARTMENT_OTHER): Payer: Self-pay | Admitting: Physical Therapy

## 2022-02-10 ENCOUNTER — Ambulatory Visit (HOSPITAL_BASED_OUTPATIENT_CLINIC_OR_DEPARTMENT_OTHER): Payer: PPO | Admitting: Physical Therapy

## 2022-02-10 DIAGNOSIS — M542 Cervicalgia: Secondary | ICD-10-CM

## 2022-02-10 DIAGNOSIS — M546 Pain in thoracic spine: Secondary | ICD-10-CM

## 2022-02-10 DIAGNOSIS — M62838 Other muscle spasm: Secondary | ICD-10-CM

## 2022-02-10 NOTE — Therapy (Signed)
OUTPATIENT PHYSICAL THERAPY TREATMENT NOTE   Patient Name: Tonya [name] MRN: [id number] DOB:[birth year], 71 y.o., female Today's Date: 02/10/2022   PT End of Session - 02/10/22 1109     Visit Number 23    Number of Visits 29    Date for PT Re-Evaluation 03/20/22    Authorization Type HTA    Progress Note Due on Visit 68    PT Start Time 1108    PT Stop Time 1145    PT Time Calculation (min) 37 min    Activity Tolerance Patient tolerated treatment well    Behavior During Therapy WFL for tasks assessed/performed                    Past Medical History:  Diagnosis Date   Coronary atherosclerosis due to calcified coronary lesion    History of breast cancer    Hyperlipidemia    Hypertension    Osteopenia    Past Surgical History:  Procedure Laterality Date   ABDOMINAL HYSTERECTOMY  2015   AUGMENTATION MAMMAPLASTY Left    BREAST BIOPSY Right 02/2019   BREAST IMPLANT EXCHANGE     FOOT SURGERY Left 05/25/2020   MASTECTOMY Left    REDUCTION MAMMAPLASTY Right    TONSILLECTOMY AND ADENOIDECTOMY  1958   Patient Active Problem List   Diagnosis Date Noted   Coronary artery disease due to lipid rich plaque 09/30/2021   Hepatic steatosis 09/30/2021   Adenoma of left adrenal gland 09/30/2021   Strain of left trapezius muscle 09/30/2021   Hyperlipidemia 05/19/2021   Somatic dysfunction of spine, thoracic 03/26/2021   Chronic left-sided thoracic back pain 12/30/2020   DCIS (ductal carcinoma in situ) 01/26/2020   Low back pain 01/26/2020   Chronic pain of both knees 09/19/2019   Osteopenia    Estrogen deficiency 04/14/2018   Essential hypertension 01/10/2018   Gastroesophageal reflux disease 01/10/2018   Mild intermittent asthma without complication 54/65/0354   Pain in left lower leg 03/03/2017   Family history of colon cancer 02/23/2017   Herpes simplex vulvovaginitis 02/18/2017   Neuropathy 02/18/2017   Elevated liver enzymes 02/17/2017   Need for  Streptococcus pneumoniae vaccination 02/17/2017   Chondromalacia of left patella 12/28/2016   Synovitis of left knee 12/28/2016   Seasonal allergic rhinitis due to fungal spores 09/25/2016   Bone mass 09/24/2016   Chronic hip pain 09/24/2016   Dark yellow-colored urine 09/24/2016   Low oxygen saturation 09/24/2016   Other specified postprocedural states 01/17/2015   Abnormal stress electrocardiogram test 10/20/2013   Breast mass 12/11/2010   Family history of malignant neoplasm of breast 12/11/2010    PCP: [name], DO  REFERRING PROVIDER: Lyndal Pulley, DO   REFERRING DIAG: [phone] (ICD-10-CM) - Chronic bilateral low back pain without sciatica  THERAPY DIAG:  Pain in thoracic spine  Cervicalgia  Other muscle spasm  Joint stiffness in cervical  ONSET DATE: 10/09/2021 MD order  SUBJECTIVE:  SUBJECTIVE STATEMENT: Notice increase in Lt thoracic and neck pain when not wearing the lift in my shoe. Increased charlie horses in hamstrings and left lower leg.    PERTINENT HISTORY:  CAD, Hx of Breast CA (in remission for 12-13 years), Osteopenia  PAIN:  Are you having pain? Yes: NPRS scale: 0 at rest, 6 when turning left/10 Pain location:  Rt cervical region Pain description: sore, grabs quickly Aggravating factors: lt cervical rotation Relieving factors: laying down    PRECAUTIONS: Other: osteopenia  WEIGHT BEARING RESTRICTIONS No  FALLS:  Has patient fallen in last 6 months? No   OCCUPATION: Pt is retired  PLOF: Independent; Pt was able to perform ADLs/IADLs and mobility with less pain.   PATIENT GOALS to be pain free in cervical and thoracic, increase walking without pain, to be able cook with less pain, to be able to get on the floor and play with  grandchildren   OBJECTIVE:   6/29 BP 119/72    pulse 67 bpm 7/19 BP 116/75    pulse 66 bpm   6/29 Leg length: superior tibial tuberosity to inf med maleolus: Rt 34.5 cm, Lt 35.5 cm  DIAGNOSTIC FINDINGS:  Thoracic MRI IMPRESSION: 1. No acute osseous abnormality of the thoracic spine. 2. Mild multilevel thoracic spondylosis with scattered noncompressive disc protrusions. No foraminal or canal stenosis at any level.  POSTURE  6/9: Rib cage rotation to the left when pelvis is center Right shoulder depressed and anteriorly roted with slight trunk sidebend right  6/13: Rt cervical rotation 38, Lt 48  6/20: cervical SB Rt 20, Lt 18 Rotation 46 bilaterally  6/29- neutral spinal alignment in kneeling and seated; standing = lumbar levoscoliosis and thoracic dextroscoliosis  7/17: 44 deg cervical rotation bilaterally with pain upon turning to the Left (after dry needling)  7/19: Prone posture= elevation noted in Rt thoracic rib cage  Cervical motion: AROM / PROM 7/19 AROM 7/27  Rt rotation 42//54 46  Lt Rotation 42//50 50    Rt Sidebend 22//32   Lt Side bend 22//30     TODAY'S TREATMENT   Treatment                            8/22:  Prone rib mobilization and STM along bil thoracic paraspinals  Treatment                            8/7:   MANUAL:  Supine STM bil upper traps Supine cervical traction Seated cervical rotation with assisted vertebral rotation  Seated first rib mobilization IASTM Rt upper trap     PATIENT EDUCATION:  Education details: pillows, anatomy of condition, stress Person educated: Patient Education method: Explanation Education comprehension: verbalized understanding and needs further education   HOME EXERCISE PROGRAM: [id number]  ASSESSMENT:  CLINICAL IMPRESSION: In prone, Rt posterior rib cage is elevated due to anterior rotation of ribs. Was able to reduce with manual therapy today. Discussed self management of this issue and how it  will change over time. Cancelled her most recent massage appt and will determine the need for them moving forward.      OBJECTIVE IMPAIRMENTS decreased activity tolerance, decreased endurance, decreased mobility, difficulty walking, decreased ROM, decreased strength, hypomobility, increased fascial restrictions, impaired flexibility, and pain.   ACTIVITY LIMITATIONS cleaning, community activity, meal prep, shopping, and yard work.   PERSONAL FACTORS Time since onset of injury/illness/exacerbation and 1  comorbidity: osteopenia  are also affecting patient's functional outcome.    REHAB POTENTIAL: Good  CLINICAL DECISION MAKING: Stable/uncomplicated  EVALUATION COMPLEXITY: Low   GOALS:   SHORT TERM GOALS: Target date: 11/18/2021  Pt will be independent and compliant with HEP for improved pain, ROM, strength, and function.  Baseline: Goal status: achieved  2.  Pt will demo improved R thoracic rotation AROM to be [facility] and 10 deg in bilat cervical Sb'ing AROM for improved mobility and stiffness. Baseline: cervical SB 22 deg bil Goal status:achieved   3.  Pt will report at least a 25% improvement in pain and sx's overall.  Baseline:  Goal status: achieved    LONG TERM GOALS:   Pt will be able to cut up vegetables, prepare food, and cook without significant pain.  Baseline:  Goal status: partially met- It is much better, not constant like it used to be and not wrapping down and to the side. Probably about a 4/10 and keeps getting worse as I push through.   2.  Pt will be able to ambulate extended community distance without significant pain or limitation. Baseline:  Goal status: achieved  3.  Pt will report at least a 70% improvement in pain and sx's toward the end of the day.  Baseline:  Goal status: ongoing- hip is way better, still getting limiting pain around neck/shoulder.   4.  Pt will report she is able to perform her normal standing activities including household  chores without significant pain or difficulty.  Baseline:  Goal status: ongoing- improved but noticed significant pain after having to dust her whole house    PLAN: PT FREQUENCY: 1-2x/week  PT DURATION: 10 weeks  PLANNED INTERVENTIONS: Therapeutic exercises, Therapeutic activity, Neuromuscular re-education, Balance training, Gait training, Patient/Family education, Joint mobilization, Stair training, Aquatic Therapy, Dry Needling, Electrical stimulation, Cryotherapy, Moist heat, Ultrasound, and Manual therapy.  PLAN FOR NEXT SESSION:   cont core strength   .Carianne Taira C. Americo Vallery PT, DPT 02/10/22 12:43 PM

## 2022-02-13 ENCOUNTER — Telehealth: Payer: Self-pay | Admitting: Family Medicine

## 2022-02-13 NOTE — Telephone Encounter (Signed)
Pt called stating that the puffiness has gone down in her eyes and its not nearly as red but there is still a decent amount of redness in the eyes. Pt also stated that she is wondering if she has pink eye after being notified that her granddaughter has gotten pink eye. Pt is wondering how to proceed with treatment to get this sorted.

## 2022-02-13 NOTE — Telephone Encounter (Signed)
Keep using ointment for 3-4 more days as that tx's pink eye also.

## 2022-02-13 NOTE — Telephone Encounter (Signed)
Called informed of PCP instructions. 

## 2022-02-15 ENCOUNTER — Encounter: Payer: Self-pay | Admitting: Family Medicine

## 2022-02-16 ENCOUNTER — Other Ambulatory Visit: Payer: Self-pay | Admitting: Family Medicine

## 2022-02-16 DIAGNOSIS — H00012 Hordeolum externum right lower eyelid: Secondary | ICD-10-CM

## 2022-02-17 ENCOUNTER — Encounter (HOSPITAL_BASED_OUTPATIENT_CLINIC_OR_DEPARTMENT_OTHER): Payer: Self-pay | Admitting: Physical Therapy

## 2022-02-17 ENCOUNTER — Ambulatory Visit (HOSPITAL_BASED_OUTPATIENT_CLINIC_OR_DEPARTMENT_OTHER): Payer: PPO | Admitting: Physical Therapy

## 2022-02-17 DIAGNOSIS — M546 Pain in thoracic spine: Secondary | ICD-10-CM

## 2022-02-17 DIAGNOSIS — M542 Cervicalgia: Secondary | ICD-10-CM

## 2022-02-17 DIAGNOSIS — M62838 Other muscle spasm: Secondary | ICD-10-CM

## 2022-02-17 NOTE — Therapy (Signed)
OUTPATIENT PHYSICAL THERAPY TREATMENT NOTE   Patient Name: Tonya [name] MRN: [id number] DOB:[birth year], 71 y.o., female Today's Date: 02/17/2022   PT End of Session - 02/17/22 1020     Visit Number 24    Number of Visits 29    Date for PT Re-Evaluation 03/20/22    Authorization Type HTA    Progress Note Due on Visit 1    PT Start Time 1019    PT Stop Time 1058    PT Time Calculation (min) 39 min    Activity Tolerance Patient tolerated treatment well    Behavior During Therapy WFL for tasks assessed/performed                    Past Medical History:  Diagnosis Date   Coronary atherosclerosis due to calcified coronary lesion    History of breast cancer    Hyperlipidemia    Hypertension    Osteopenia    Past Surgical History:  Procedure Laterality Date   ABDOMINAL HYSTERECTOMY  2015   AUGMENTATION MAMMAPLASTY Left    BREAST BIOPSY Right 02/2019   BREAST IMPLANT EXCHANGE     FOOT SURGERY Left 05/25/2020   MASTECTOMY Left    REDUCTION MAMMAPLASTY Right    TONSILLECTOMY AND ADENOIDECTOMY  1958   Patient Active Problem List   Diagnosis Date Noted   Coronary artery disease due to lipid rich plaque 09/30/2021   Hepatic steatosis 09/30/2021   Adenoma of left adrenal gland 09/30/2021   Strain of left trapezius muscle 09/30/2021   Hyperlipidemia 05/19/2021   Somatic dysfunction of spine, thoracic 03/26/2021   Chronic left-sided thoracic back pain 12/30/2020   DCIS (ductal carcinoma in situ) 01/26/2020   Low back pain 01/26/2020   Chronic pain of both knees 09/19/2019   Osteopenia    Estrogen deficiency 04/14/2018   Essential hypertension 01/10/2018   Gastroesophageal reflux disease 01/10/2018   Mild intermittent asthma without complication 56/15/3794   Pain in left lower leg 03/03/2017   Family history of colon cancer 02/23/2017   Herpes simplex vulvovaginitis 02/18/2017   Neuropathy 02/18/2017   Elevated liver enzymes 02/17/2017   Need for  Streptococcus pneumoniae vaccination 02/17/2017   Chondromalacia of left patella 12/28/2016   Synovitis of left knee 12/28/2016   Seasonal allergic rhinitis due to fungal spores 09/25/2016   Bone mass 09/24/2016   Chronic hip pain 09/24/2016   Dark yellow-colored urine 09/24/2016   Low oxygen saturation 09/24/2016   Other specified postprocedural states 01/17/2015   Abnormal stress electrocardiogram test 10/20/2013   Breast mass 12/11/2010   Family history of malignant neoplasm of breast 12/11/2010    PCP: [name], DO  REFERRING PROVIDER: Lyndal Pulley, DO   REFERRING DIAG: [phone] (ICD-10-CM) - Chronic bilateral low back pain without sciatica  THERAPY DIAG:  Pain in thoracic spine  Cervicalgia  Other muscle spasm  Joint stiffness in cervical  ONSET DATE: 10/09/2021 MD order  SUBJECTIVE:  SUBJECTIVE STATEMENT: Neck feels good- occasionally getting the sensation in left upper trap. Moving around 25-27th of this month.    PERTINENT HISTORY:  CAD, Hx of Breast CA (in remission for 12-13 years), Osteopenia  PAIN:  Are you having pain? Yes: NPRS scale: 0 at rest, 6 when turning left/10 Pain location:  Rt cervical region Pain description: sore, grabs quickly Aggravating factors: lt cervical rotation Relieving factors: laying down    PRECAUTIONS: Other: osteopenia  WEIGHT BEARING RESTRICTIONS No  FALLS:  Has patient fallen in last 6 months? No   OCCUPATION: Pt is retired  PLOF: Independent; Pt was able to perform ADLs/IADLs and mobility with less pain.   PATIENT GOALS to be pain free in cervical and thoracic, increase walking without pain, to be able cook with less pain, to be able to get on the floor and play with grandchildren   OBJECTIVE:   6/29 BP  119/72    pulse 67 bpm 7/19 BP 116/75    pulse 66 bpm   6/29 Leg length: superior tibial tuberosity to inf med maleolus: Rt 34.5 cm, Lt 35.5 cm  DIAGNOSTIC FINDINGS:  Thoracic MRI IMPRESSION: 1. No acute osseous abnormality of the thoracic spine. 2. Mild multilevel thoracic spondylosis with scattered noncompressive disc protrusions. No foraminal or canal stenosis at any level.  POSTURE  6/9: Rib cage rotation to the left when pelvis is center Right shoulder depressed and anteriorly roted with slight trunk sidebend right  6/13: Rt cervical rotation 38, Lt 48  6/20: cervical SB Rt 20, Lt 18 Rotation 46 bilaterally  6/29- neutral spinal alignment in kneeling and seated; standing = lumbar levoscoliosis and thoracic dextroscoliosis  7/17: 44 deg cervical rotation bilaterally with pain upon turning to the Left (after dry needling)  7/19: Prone posture= elevation noted in Rt thoracic rib cage  Cervical motion: AROM / PROM 7/19 AROM 7/27  Rt rotation 42//54 46  Lt Rotation 42//50 50    Rt Sidebend 22//32   Lt Side bend 22//30     TODAY'S TREATMENT   Treatment                            8/29:  MANUAL: prone STM to left upper trap & levator and Rt periscap; prone thoracic and rib mobs Supine W chest stretch with deep breathing Attempted laying on 1/2 foam roll but irritated Lt QL Hooklying arm scissors Shoulder flexion stretch at counter     PATIENT EDUCATION:  Education details: pillows, anatomy of condition, stress Person educated: Patient Education method: Explanation Education comprehension: verbalized understanding and needs further education   HOME EXERCISE PROGRAM: [id number]  ASSESSMENT:  CLINICAL IMPRESSION: Rt post, upper rib cage was elevated in prone again upon arrival after being on the computer a lot recently. Takes less pressure for manual therapy to reduce elevaiton and discussed using UE mobility in supine for rib mobilizaion. Tightness in left  pecs continue to pull shoulder anteriorly. Will move to 1 every other week after next appointment to continue a monitored transition.      OBJECTIVE IMPAIRMENTS decreased activity tolerance, decreased endurance, decreased mobility, difficulty walking, decreased ROM, decreased strength, hypomobility, increased fascial restrictions, impaired flexibility, and pain.   ACTIVITY LIMITATIONS cleaning, community activity, meal prep, shopping, and yard work.   PERSONAL FACTORS Time since onset of injury/illness/exacerbation and 1 comorbidity: osteopenia  are also affecting patient's functional outcome.    REHAB POTENTIAL: Good  CLINICAL DECISION MAKING: Stable/uncomplicated  EVALUATION COMPLEXITY: Low   GOALS:   SHORT TERM GOALS: Target date: 11/18/2021  Pt will be independent and compliant with HEP for improved pain, ROM, strength, and function.  Baseline: Goal status: achieved  2.  Pt will demo improved R thoracic rotation AROM to be [facility] and 10 deg in bilat cervical Sb'ing AROM for improved mobility and stiffness. Baseline: cervical SB 22 deg bil Goal status:achieved   3.  Pt will report at least a 25% improvement in pain and sx's overall.  Baseline:  Goal status: achieved    LONG TERM GOALS:   Pt will be able to cut up vegetables, prepare food, and cook without significant pain.  Baseline:  Goal status: partially met- It is much better, not constant like it used to be and not wrapping down and to the side. Probably about a 4/10 and keeps getting worse as I push through.   2.  Pt will be able to ambulate extended community distance without significant pain or limitation. Baseline:  Goal status: achieved  3.  Pt will report at least a 70% improvement in pain and sx's toward the end of the day.  Baseline:  Goal status: ongoing- hip is way better, still getting limiting pain around neck/shoulder.   4.  Pt will report she is able to perform her normal standing activities  including household chores without significant pain or difficulty.  Baseline:  Goal status: ongoing- improved but noticed significant pain after having to dust her whole house    PLAN: PT FREQUENCY: 1-2x/week  PT DURATION: 10 weeks  PLANNED INTERVENTIONS: Therapeutic exercises, Therapeutic activity, Neuromuscular re-education, Balance training, Gait training, Patient/Family education, Joint mobilization, Stair training, Aquatic Therapy, Dry Needling, Electrical stimulation, Cryotherapy, Moist heat, Ultrasound, and Manual therapy.  PLAN FOR NEXT SESSION:   cont core strength   .Burdette Gergely C. Aldene Hendon PT, DPT 02/17/22 10:59 AM

## 2022-02-24 ENCOUNTER — Ambulatory Visit (HOSPITAL_BASED_OUTPATIENT_CLINIC_OR_DEPARTMENT_OTHER): Payer: PPO | Admitting: Physical Therapy

## 2022-02-26 DIAGNOSIS — H00022 Hordeolum internum right lower eyelid: Secondary | ICD-10-CM | POA: Diagnosis not present

## 2022-03-03 ENCOUNTER — Encounter (HOSPITAL_BASED_OUTPATIENT_CLINIC_OR_DEPARTMENT_OTHER): Payer: PPO | Admitting: Physical Therapy

## 2022-03-10 ENCOUNTER — Encounter (HOSPITAL_BASED_OUTPATIENT_CLINIC_OR_DEPARTMENT_OTHER): Payer: Self-pay | Admitting: Physical Therapy

## 2022-03-10 ENCOUNTER — Ambulatory Visit (HOSPITAL_BASED_OUTPATIENT_CLINIC_OR_DEPARTMENT_OTHER): Payer: PPO | Attending: Family Medicine | Admitting: Physical Therapy

## 2022-03-10 DIAGNOSIS — M62838 Other muscle spasm: Secondary | ICD-10-CM

## 2022-03-10 DIAGNOSIS — M546 Pain in thoracic spine: Secondary | ICD-10-CM | POA: Diagnosis not present

## 2022-03-10 DIAGNOSIS — M542 Cervicalgia: Secondary | ICD-10-CM

## 2022-03-10 NOTE — Therapy (Signed)
OUTPATIENT PHYSICAL THERAPY TREATMENT NOTE   Patient Name: Tonya [name] MRN: [id number] DOB:Nov 03, 1950, 71 y.o., female Today's Date: 03/10/2022   PT End of Session - 03/10/22 1024     Visit Number 25    Number of Visits 29    Date for PT Re-Evaluation 03/20/22    Authorization Type HTA    Progress Note Due on Visit 34    PT Start Time 1025    PT Stop Time 1057    PT Time Calculation (min) 32 min    Activity Tolerance Patient tolerated treatment well    Behavior During Therapy WFL for tasks assessed/performed                     Past Medical History:  Diagnosis Date   Coronary atherosclerosis due to calcified coronary lesion    History of breast cancer    Hyperlipidemia    Hypertension    Osteopenia    Past Surgical History:  Procedure Laterality Date   ABDOMINAL HYSTERECTOMY  2015   AUGMENTATION MAMMAPLASTY Left    BREAST BIOPSY Right 02/2019   BREAST IMPLANT EXCHANGE     FOOT SURGERY Left 05/25/2020   MASTECTOMY Left    REDUCTION MAMMAPLASTY Right    TONSILLECTOMY AND ADENOIDECTOMY  1958   Patient Active Problem List   Diagnosis Date Noted   Coronary artery disease due to lipid rich plaque 09/30/2021   Hepatic steatosis 09/30/2021   Adenoma of left adrenal gland 09/30/2021   Strain of left trapezius muscle 09/30/2021   Hyperlipidemia 05/19/2021   Somatic dysfunction of spine, thoracic 03/26/2021   Chronic left-sided thoracic back pain 12/30/2020   DCIS (ductal carcinoma in situ) 01/26/2020   Low back pain 01/26/2020   Chronic pain of both knees 09/19/2019   Osteopenia    Estrogen deficiency 04/14/2018   Essential hypertension 01/10/2018   Gastroesophageal reflux disease 01/10/2018   Mild intermittent asthma without complication 25/95/6387   Pain in left lower leg 03/03/2017   Family history of colon cancer 02/23/2017   Herpes simplex vulvovaginitis 02/18/2017   Neuropathy 02/18/2017   Elevated liver enzymes 02/17/2017   Need for  Streptococcus pneumoniae vaccination 02/17/2017   Chondromalacia of left patella 12/28/2016   Synovitis of left knee 12/28/2016   Seasonal allergic rhinitis due to fungal spores 09/25/2016   Bone mass 09/24/2016   Chronic hip pain 09/24/2016   Dark yellow-colored urine 09/24/2016   Low oxygen saturation 09/24/2016   Other specified postprocedural states 01/17/2015   Abnormal stress electrocardiogram test 10/20/2013   Breast mass 12/11/2010   Family history of malignant neoplasm of breast 12/11/2010    PCP: [name], DO  REFERRING PROVIDER: Lyndal Pulley, DO   REFERRING DIAG: [phone] (ICD-10-CM) - Chronic bilateral low back pain without sciatica  THERAPY DIAG:  Pain in thoracic spine  Cervicalgia  Other muscle spasm  Joint stiffness in cervical  ONSET DATE: 10/09/2021 MD order  SUBJECTIVE:  SUBJECTIVE STATEMENT: Overall ok, just when I am tired but if I lay down and relax and do some exercises I am fine. So busy with the move.    PERTINENT HISTORY:  CAD, Hx of Breast CA (in remission for 12-13 years), Osteopenia  PAIN:  None, just a little tight when turning to the left today.     PRECAUTIONS: Other: osteopenia  WEIGHT BEARING RESTRICTIONS No  FALLS:  Has patient fallen in last 6 months? No   OCCUPATION: Pt is retired  PLOF: Independent; Pt was able to perform ADLs/IADLs and mobility with less pain.   PATIENT GOALS to be pain free in cervical and thoracic, increase walking without pain, to be able cook with less pain, to be able to get on the floor and play with grandchildren   OBJECTIVE:   6/29 BP 119/72    pulse 67 bpm 7/19 BP 116/75    pulse 66 bpm   6/29 Leg length: superior tibial tuberosity to inf med maleolus: Rt 34.5 cm, Lt 35.5  cm  DIAGNOSTIC FINDINGS:  Thoracic MRI IMPRESSION: 1. No acute osseous abnormality of the thoracic spine. 2. Mild multilevel thoracic spondylosis with scattered noncompressive disc protrusions. No foraminal or canal stenosis at any level.  POSTURE  6/9: Rib cage rotation to the left when pelvis is center Right shoulder depressed and anteriorly roted with slight trunk sidebend right  6/13: Rt cervical rotation 38, Lt 48  6/20: cervical SB Rt 20, Lt 18 Rotation 46 bilaterally  6/29- neutral spinal alignment in kneeling and seated; standing = lumbar levoscoliosis and thoracic dextroscoliosis  7/17: 44 deg cervical rotation bilaterally with pain upon turning to the Left (after dry needling)  7/19: Prone posture= elevation noted in Rt thoracic rib cage  Cervical motion: AROM / PROM 7/19 AROM 7/27  Rt rotation 42//54 46  Lt Rotation 42//50 50    Rt Sidebend 22//32   Lt Side bend 22//30     TODAY'S TREATMENT   Treatment                            9/19:  MANUAL: prone rib mobs, STM levator scap & periscap region.  Reviewed importance of regular, early rest  Treatment                            8/29:  MANUAL: prone STM to left upper trap & levator and Rt periscap; prone thoracic and rib mobs Supine W chest stretch with deep breathing Attempted laying on 1/2 foam roll but irritated Lt QL Hooklying arm scissors Shoulder flexion stretch at counter     PATIENT EDUCATION:  Education details: pillows, anatomy of condition, stress Person educated: Patient Education method: Explanation Education comprehension: verbalized understanding and needs further education   HOME EXERCISE PROGRAM: [id number]  ASSESSMENT:  CLINICAL IMPRESSION: Less stiffness initially with manual therapy today. Pt reports she is doing better about recognizing when to rest and has been doing a lot with preparing for her move. Will see her again after the move for symptom monitoring and determine need  for further care at that point.      OBJECTIVE IMPAIRMENTS decreased activity tolerance, decreased endurance, decreased mobility, difficulty walking, decreased ROM, decreased strength, hypomobility, increased fascial restrictions, impaired flexibility, and pain.   ACTIVITY LIMITATIONS cleaning, community activity, meal prep, shopping, and yard work.   PERSONAL FACTORS Time since onset of injury/illness/exacerbation and 1  comorbidity: osteopenia  are also affecting patient's functional outcome.    REHAB POTENTIAL: Good  CLINICAL DECISION MAKING: Stable/uncomplicated  EVALUATION COMPLEXITY: Low   GOALS:   SHORT TERM GOALS: Target date: 11/18/2021  Pt will be independent and compliant with HEP for improved pain, ROM, strength, and function.  Baseline: Goal status: achieved  2.  Pt will demo improved R thoracic rotation AROM to be [facility] and 10 deg in bilat cervical Sb'ing AROM for improved mobility and stiffness. Baseline: cervical SB 22 deg bil Goal status:achieved   3.  Pt will report at least a 25% improvement in pain and sx's overall.  Baseline:  Goal status: achieved    LONG TERM GOALS:   Pt will be able to cut up vegetables, prepare food, and cook without significant pain.  Baseline:  Goal status: partially met- It is much better, not constant like it used to be and not wrapping down and to the side. Probably about a 4/10 and keeps getting worse as I push through.   2.  Pt will be able to ambulate extended community distance without significant pain or limitation. Baseline:  Goal status: achieved  3.  Pt will report at least a 70% improvement in pain and sx's toward the end of the day.  Baseline:  Goal status: ongoing- hip is way better, still getting limiting pain around neck/shoulder.   4.  Pt will report she is able to perform her normal standing activities including household chores without significant pain or difficulty.  Baseline:  Goal status: ongoing-  improved but noticed significant pain after having to dust her whole house    PLAN: PT FREQUENCY: 1-2x/week  PT DURATION: 10 weeks  PLANNED INTERVENTIONS: Therapeutic exercises, Therapeutic activity, Neuromuscular re-education, Balance training, Gait training, Patient/Family education, Joint mobilization, Stair training, Aquatic Therapy, Dry Needling, Electrical stimulation, Cryotherapy, Moist heat, Ultrasound, and Manual therapy.  PLAN FOR NEXT SESSION:   cont core strength   .Rayvion Stumph C. Romesha Scherer PT, DPT 03/10/22 10:59 AM

## 2022-03-13 DIAGNOSIS — H43811 Vitreous degeneration, right eye: Secondary | ICD-10-CM | POA: Diagnosis not present

## 2022-03-13 DIAGNOSIS — H00022 Hordeolum internum right lower eyelid: Secondary | ICD-10-CM | POA: Diagnosis not present

## 2022-03-17 ENCOUNTER — Encounter (HOSPITAL_BASED_OUTPATIENT_CLINIC_OR_DEPARTMENT_OTHER): Payer: PPO | Admitting: Physical Therapy

## 2022-03-19 ENCOUNTER — Encounter (HOSPITAL_BASED_OUTPATIENT_CLINIC_OR_DEPARTMENT_OTHER): Payer: PPO | Admitting: Physical Therapy

## 2022-03-21 ENCOUNTER — Encounter (HOSPITAL_BASED_OUTPATIENT_CLINIC_OR_DEPARTMENT_OTHER): Payer: Self-pay | Admitting: Physical Therapy

## 2022-03-21 ENCOUNTER — Ambulatory Visit (HOSPITAL_BASED_OUTPATIENT_CLINIC_OR_DEPARTMENT_OTHER): Payer: PPO | Admitting: Physical Therapy

## 2022-03-21 DIAGNOSIS — M546 Pain in thoracic spine: Secondary | ICD-10-CM | POA: Diagnosis not present

## 2022-03-21 DIAGNOSIS — M62838 Other muscle spasm: Secondary | ICD-10-CM

## 2022-03-21 DIAGNOSIS — M542 Cervicalgia: Secondary | ICD-10-CM

## 2022-03-21 NOTE — Therapy (Signed)
OUTPATIENT PHYSICAL THERAPY TREATMENT NOTE/Discharge   Patient Name: Tonya [name] MRN: [id number] DOB:1951/06/09, 71 y.o., female Today's Date: 03/21/2022   PT End of Session - 03/21/22 1058     Visit Number 26    Number of Visits 29    Date for PT Re-Evaluation 03/21/22    Authorization Type HTA    Progress Note Due on Visit 84    PT Start Time 1057    PT Stop Time 1133    PT Time Calculation (min) 36 min    Activity Tolerance Patient tolerated treatment well    Behavior During Therapy WFL for tasks assessed/performed                     Past Medical History:  Diagnosis Date   Coronary atherosclerosis due to calcified coronary lesion    History of breast cancer    Hyperlipidemia    Hypertension    Osteopenia    Past Surgical History:  Procedure Laterality Date   ABDOMINAL HYSTERECTOMY  2015   AUGMENTATION MAMMAPLASTY Left    BREAST BIOPSY Right 02/2019   BREAST IMPLANT EXCHANGE     FOOT SURGERY Left 05/25/2020   MASTECTOMY Left    REDUCTION MAMMAPLASTY Right    TONSILLECTOMY AND ADENOIDECTOMY  1958   Patient Active Problem List   Diagnosis Date Noted   Coronary artery disease due to lipid rich plaque 09/30/2021   Hepatic steatosis 09/30/2021   Adenoma of left adrenal gland 09/30/2021   Strain of left trapezius muscle 09/30/2021   Hyperlipidemia 05/19/2021   Somatic dysfunction of spine, thoracic 03/26/2021   Chronic left-sided thoracic back pain 12/30/2020   DCIS (ductal carcinoma in situ) 01/26/2020   Low back pain 01/26/2020   Chronic pain of both knees 09/19/2019   Osteopenia    Estrogen deficiency 04/14/2018   Essential hypertension 01/10/2018   Gastroesophageal reflux disease 01/10/2018   Mild intermittent asthma without complication 22/63/3354   Pain in left lower leg 03/03/2017   Family history of colon cancer 02/23/2017   Herpes simplex vulvovaginitis 02/18/2017   Neuropathy 02/18/2017   Elevated liver enzymes 02/17/2017   Need  for Streptococcus pneumoniae vaccination 02/17/2017   Chondromalacia of left patella 12/28/2016   Synovitis of left knee 12/28/2016   Seasonal allergic rhinitis due to fungal spores 09/25/2016   Bone mass 09/24/2016   Chronic hip pain 09/24/2016   Dark yellow-colored urine 09/24/2016   Low oxygen saturation 09/24/2016   Other specified postprocedural states 01/17/2015   Abnormal stress electrocardiogram test 10/20/2013   Breast mass 12/11/2010   Family history of malignant neoplasm of breast 12/11/2010    PCP: Tonya Pal, DO  REFERRING PROVIDER: Lyndal Pulley, DO   REFERRING DIAG: [phone] (ICD-10-CM) - Chronic bilateral low back pain without sciatica  THERAPY DIAG:  Pain in thoracic spine  Cervicalgia  Other muscle spasm  Joint stiffness in cervical  ONSET DATE: 10/09/2021 MD order  SUBJECTIVE:  SUBJECTIVE STATEMENT: Through the move my neck has been great and I had a little pain in Lt thoracic region but sat for a few min and it was fine. Front of the right shoulder is killing me.    PERTINENT HISTORY:  CAD, Hx of Breast CA (in remission for 12-13 years), Osteopenia  PAIN:  None, just a little tight when turning to the left today.     PRECAUTIONS: Other: osteopenia  WEIGHT BEARING RESTRICTIONS No  FALLS:  Has patient fallen in last 6 months? No   OCCUPATION: Pt is retired  PLOF: Independent; Pt was able to perform ADLs/IADLs and mobility with less pain.   PATIENT GOALS to be pain free in cervical and thoracic, increase walking without pain, to be able cook with less pain, to be able to get on the floor and play with grandchildren   OBJECTIVE:   6/29 BP 119/72    pulse 67 bpm 7/19 BP 116/75    pulse 66 bpm   6/29 Leg length: superior tibial  tuberosity to inf med maleolus: Rt 34.5 cm, Lt 35.5 cm  DIAGNOSTIC FINDINGS:  Thoracic MRI IMPRESSION: 1. No acute osseous abnormality of the thoracic spine. 2. Mild multilevel thoracic spondylosis with scattered noncompressive disc protrusions. No foraminal or canal stenosis at any level.  POSTURE  6/9: Rib cage rotation to the left when pelvis is center Right shoulder depressed and anteriorly roted with slight trunk sidebend right  6/13: Rt cervical rotation 38, Lt 48  6/20: cervical SB Rt 20, Lt 18 Rotation 46 bilaterally  6/29- neutral spinal alignment in kneeling and seated; standing = lumbar levoscoliosis and thoracic dextroscoliosis  7/17: 44 deg cervical rotation bilaterally with pain upon turning to the Left (after dry needling)  7/19: Prone posture= elevation noted in Rt thoracic rib cage  Cervical motion: AROM / PROM 7/19 AROM 7/27  Rt rotation 42//54 46  Lt Rotation 42//50 50    Rt Sidebend 22//32   Lt Side bend 22//30    9/30: cervical ROM equal bilaterally without discomfort reported.     TODAY'S TREATMENT   Treatment                            9/30:  MANUAL: STM & passive stretching of Rt pec minor Door pec stretch  Treatment                            9/19:  MANUAL: prone rib mobs, STM levator scap & periscap region.  Reviewed importance of regular, early rest  Treatment                            8/29:  MANUAL: prone STM to left upper trap & levator and Rt periscap; prone thoracic and rib mobs Supine W chest stretch with deep breathing Attempted laying on 1/2 foam roll but irritated Lt QL Hooklying arm scissors Shoulder flexion stretch at counter     PATIENT EDUCATION:  Education details: sprain/strain, lifting Person educated: Patient Education method: Explanation Education comprehension: verbalized understanding and needs further education   HOME EXERCISE PROGRAM: [id number]  ASSESSMENT:  CLINICAL IMPRESSION: Pt has made  significant progress since beginning PT. She was able to tolerate doing a lot of work in the recent move and recognizes when it is necessary to sit and rest to avoid severe or long term pain.  Pec minor spasm and proximal biceps tendon discomfort today without s/s of tearing. Encouraged her to continue moving the arm and stretching but to avoid heavy lifting. Encouraged her to contact me with any future needs.      OBJECTIVE IMPAIRMENTS decreased activity tolerance, decreased endurance, decreased mobility, difficulty walking, decreased ROM, decreased strength, hypomobility, increased fascial restrictions, impaired flexibility, and pain.   ACTIVITY LIMITATIONS cleaning, community activity, meal prep, shopping, and yard work.   PERSONAL FACTORS Time since onset of injury/illness/exacerbation and 1 comorbidity: osteopenia  are also affecting patient's functional outcome.    REHAB POTENTIAL: Good  CLINICAL DECISION MAKING: Stable/uncomplicated  EVALUATION COMPLEXITY: Low   GOALS:   SHORT TERM GOALS: Target date: 11/18/2021  Pt will be independent and compliant with HEP for improved pain, ROM, strength, and function.  Baseline: Goal status: achieved  2.  Pt will demo improved R thoracic rotation AROM to be [facility] and 10 deg in bilat cervical Sb'ing AROM for improved mobility and stiffness. Baseline: cervical SB 22 deg bil Goal status:achieved   3.  Pt will report at least a 25% improvement in pain and sx's overall.  Baseline:  Goal status: achieved    LONG TERM GOALS:   Pt will be able to cut up vegetables, prepare food, and cook without significant pain.  Baseline:  Goal status: achieved  2.  Pt will be able to ambulate extended community distance without significant pain or limitation. Baseline:  Goal status: achieved  3.  Pt will report at least a 70% improvement in pain and sx's toward the end of the day.  Baseline:  Goal status: achieved  4.  Pt will report she is able  to perform her normal standing activities including household chores without significant pain or difficulty.  Baseline:  Goal status: achieved    PLAN: PT FREQUENCY: 1-2x/week  PT DURATION: 10 weeks  PLANNED INTERVENTIONS: Therapeutic exercises, Therapeutic activity, Neuromuscular re-education, Balance training, Gait training, Patient/Family education, Joint mobilization, Stair training, Aquatic Therapy, Dry Needling, Electrical stimulation, Cryotherapy, Moist heat, Ultrasound, and Manual therapy. PHYSICAL THERAPY DISCHARGE SUMMARY  Visits from Start of Care: 26  Current functional level related to goals / functional outcomes: See above   Remaining deficits: See above   Education / Equipment: Anatomy of condition, POC, HEP, exercise form/rationale    Patient agrees to discharge. Patient goals were met. Patient is being discharged due to meeting the stated rehab goals.    Gay Filler Briggs Edelen PT, DPT 03/21/22 12:10 PM

## 2022-03-24 ENCOUNTER — Encounter (HOSPITAL_BASED_OUTPATIENT_CLINIC_OR_DEPARTMENT_OTHER): Payer: PPO | Admitting: Physical Therapy

## 2022-03-25 ENCOUNTER — Other Ambulatory Visit: Payer: Self-pay | Admitting: Cardiology

## 2022-03-25 DIAGNOSIS — I2510 Atherosclerotic heart disease of native coronary artery without angina pectoris: Secondary | ICD-10-CM

## 2022-04-07 NOTE — Progress Notes (Deleted)
Tonya [name] Phone: [phone] Subjective:    I'm seeing this patient by the request  of:  [name], DO  CC:   [id number]  10/09/2021 Low back pain, continues to have discomfort overall.  Patient has had MRIs that did not show any significant findings at the moment.  This includes a thoracic as well as the MRI of the abdomen and pelvis.  Patient continues to have more of a radicular symptoms that could be potentially a atypical shingles presentation and was given acyclovir today.  Discussed that I do feel formal physical therapy or aquatic therapy would be beneficial and referral placed today.  Patient no longer responds well to the osteopathic manipulation to hold on this I would say the differential at this point would include potential fibromyalgia and would need to consider the possibility of medications which patient would be hesitant of taking.  Follow-up with me again in 2 to 3 months.  Total time with patient and reviewing patient's imaging 32 minutes  Updated 04/09/2022 Tonya [name] is a 71 y.o. female coming in with complaint of right shoulder pain      Past Medical History:  Diagnosis Date   Coronary atherosclerosis due to calcified coronary lesion    History of breast cancer    Hyperlipidemia    Hypertension    Osteopenia    Past Surgical History:  Procedure Laterality Date   ABDOMINAL HYSTERECTOMY  2015   AUGMENTATION MAMMAPLASTY Left    BREAST BIOPSY Right 02/2019   BREAST IMPLANT EXCHANGE     FOOT SURGERY Left 05/25/2020   MASTECTOMY Left    REDUCTION MAMMAPLASTY Right    TONSILLECTOMY AND ADENOIDECTOMY  1958   Social History   Socioeconomic History   Marital status: Married    Spouse name: Not on file   Number of children: 1   Years of education: Not on file   Highest education level: Not on file  Occupational History   Not on file  Tobacco Use   Smoking status:  Never   Smokeless tobacco: Never  Vaping Use   Vaping Use: Never used  Substance and Sexual Activity   Alcohol use: Yes    Comment: rarely   Drug use: Never   Sexual activity: Yes    Birth control/protection: None  Other Topics Concern   Not on file  Social History Narrative   Not on file   Social Determinants of Health   Financial Resource Strain: Low Risk  (11/04/2021)   Overall Financial Resource Strain (CARDIA)    Difficulty of Paying Living Expenses: Not hard at all  Food Insecurity: No Food Insecurity (11/04/2021)   Hunger Vital Sign    Worried About Running Out of Food in the Last Year: Never true    [place] in the Last Year: Never true  Transportation Needs: No Transportation Needs (11/04/2021)   PRAPARE - Hydrologist (Medical): No    Lack of Transportation (Non-Medical): No  Physical Activity: Inactive (11/04/2021)   Exercise Vital Sign    Days of Exercise per Week: 0 days    Minutes of Exercise per Session: 0 min  Stress: No Stress Concern Present (11/04/2021)   [place]    Feeling of Stress : Not at all  Social Connections: Moderately Isolated (11/04/2021)   Social Connection and Isolation Panel [NHANES]  Frequency of Communication with Friends and Family: More than three times a week    Frequency of Social Gatherings with Friends and Family: Twice a week    Attends Religious Services: Never    Marine scientist or Organizations: No    Attends Music therapist: Never    Marital Status: Married   Allergies  Allergen Reactions   Crestor [Rosuvastatin]     Myalgias    Lipitor [Atorvastatin]     myalgias   Family History  Problem Relation Age of Onset   Heart attack Mother    Heart attack Father        died from   Lung disease Brother    Cancer Brother    Breast cancer Maternal Aunt    Breast cancer Paternal Aunt      Current  Outpatient Medications (Cardiovascular):    atenolol (TENORMIN) 50 MG tablet,    pravastatin (PRAVACHOL) 40 MG tablet, TAKE 1 TABLET BY MOUTH EVERY DAY IN THE EVENING  Current Outpatient Medications (Respiratory):    montelukast (SINGULAIR) 10 MG tablet, TAKE 1 TABLET BY MOUTH EVERYDAY AT BEDTIME  Current Outpatient Medications (Analgesics):    aspirin EC 81 MG tablet, Take 1 tablet (81 mg total) by mouth daily. Swallow whole.   Current Outpatient Medications (Other):    calcium-vitamin D (OSCAL WITH D) 250-125 MG-UNIT tablet, Take 1 tablet by mouth daily.   erythromycin ophthalmic ointment, Place 1 Application into the right eye at bedtime.   pantoprazole (PROTONIX) 20 MG tablet, Take 1 tablet (20 mg total) by mouth daily.   Reviewed prior external information including notes and imaging from  primary care provider As well as notes that were available from care everywhere and other healthcare systems.  Past medical history, social, surgical and family history all reviewed in electronic medical record.  No pertanent information unless stated regarding to the chief complaint.   Review of Systems:  No headache, visual changes, nausea, vomiting, diarrhea, constipation, dizziness, abdominal pain, skin rash, fevers, chills, night sweats, weight loss, swollen lymph nodes, body aches, joint swelling, chest pain, shortness of breath, mood changes. POSITIVE muscle aches  Objective  There were no vitals taken for this visit.   General: No apparent distress alert and oriented x3 mood and affect normal, dressed appropriately.  HEENT: Pupils equal, extraocular movements intact  Respiratory: Patient's speak in full sentences and does not appear short of breath  Cardiovascular: No lower extremity edema, non tender, no erythema      Impression and Recommendations:

## 2022-04-09 ENCOUNTER — Ambulatory Visit: Payer: PPO | Admitting: Family Medicine

## 2022-04-20 NOTE — Progress Notes (Unsigned)
[facility] Phone: [phone] Subjective:    I'm seeing this patient by the request  of:  [name], DO  CC: Right shoulder pain  DVV:[id number]  Tonya [name] is a 71 y.o. female coming in with complaint of R shoulder pain. Seen earlier this year for back pain. Patient states that she was moving and packing the pain is going on for 5 weeks and locates the pain to the front of the shoulder/armpit area. Pain was so bad her ROM was so limited, she has since been to PT and did some stuff there and it did help but she is still having some issues. The pain is worse at the end of the night unable to put her arm behind her and sleeping at night is bad. Patient will toss and turn because of the pain and cannot lay on her back. Patient has been using ice and taking Advil and resting.   Patient is also having left foot pain on the top of the foot that radiates laterally up her ankle and when she walks there is clicking. Patient states she did have previous surgery on the right foot about 2 years ago and has had a broken little toe. Patient has been on her feet a lot more. Patient states the pain on the top of her foot feels like someone has stepped on her feet. Patient has tried to wear sneakers that tie has a heel pad in her other shoe because Right leg is shorter than left. This pain has been going on for about two weeks. Patient states that weight on the foot hurts (laying in bed and the weight of the blanket pushing down on her foot).   MRI cervical 11/2021 IMPRESSION: 1.  No acute fracture or dislocation.   2. Multilevel degenerate disc disease prominent at C4-C5, C5-C6 and C6-C7.          Past Medical History:  Diagnosis Date   Coronary atherosclerosis due to calcified coronary lesion    History of breast cancer    Hyperlipidemia    Hypertension    Osteopenia    Past Surgical History:  Procedure  Laterality Date   ABDOMINAL HYSTERECTOMY  2015   AUGMENTATION MAMMAPLASTY Left    BREAST BIOPSY Right 02/2019   BREAST IMPLANT EXCHANGE     FOOT SURGERY Left 05/25/2020   MASTECTOMY Left    REDUCTION MAMMAPLASTY Right    TONSILLECTOMY AND ADENOIDECTOMY  1958   Social History   Socioeconomic History   Marital status: Married    Spouse name: Not on file   Number of children: 1   Years of education: Not on file   Highest education level: Not on file  Occupational History   Not on file  Tobacco Use   Smoking status: Never   Smokeless tobacco: Never  Vaping Use   Vaping Use: Never used  Substance and Sexual Activity   Alcohol use: Yes    Comment: rarely   Drug use: Never   Sexual activity: Yes    Birth control/protection: None  Other Topics Concern   Not on file  Social History Narrative   Not on file   Social Determinants of Health   Financial Resource Strain: Low Risk  (11/04/2021)   Overall Financial Resource Strain (CARDIA)    Difficulty of Paying Living Expenses: Not hard at all  Food Insecurity: No Food Insecurity (11/04/2021)   Hunger Vital Sign  Worried About Charity fundraiser in the Last Year: Never true    [place] in the Last Year: Never true  Transportation Needs: No Transportation Needs (11/04/2021)   PRAPARE - Hydrologist (Medical): No    Lack of Transportation (Non-Medical): No  Physical Activity: Inactive (11/04/2021)   Exercise Vital Sign    Days of Exercise per Week: 0 days    Minutes of Exercise per Session: 0 min  Stress: No Stress Concern Present (11/04/2021)   [place]    Feeling of Stress : Not at all  Social Connections: Moderately Isolated (11/04/2021)   Social Connection and Isolation Panel [NHANES]    Frequency of Communication with Friends and Family: More than three times a week    Frequency of Social Gatherings with Friends and  Family: Twice a week    Attends Religious Services: Never    Marine scientist or Organizations: No    Attends Music therapist: Never    Marital Status: Married   Allergies  Allergen Reactions   Crestor [Rosuvastatin]     Myalgias    Lipitor [Atorvastatin]     myalgias   Family History  Problem Relation Age of Onset   Heart attack Mother    Heart attack Father        died from   Lung disease Brother    Cancer Brother    Breast cancer Maternal Aunt    Breast cancer Paternal Aunt      Current Outpatient Medications (Cardiovascular):    atenolol (TENORMIN) 50 MG tablet,    pravastatin (PRAVACHOL) 40 MG tablet, TAKE 1 TABLET BY MOUTH EVERY DAY IN THE EVENING  Current Outpatient Medications (Respiratory):    montelukast (SINGULAIR) 10 MG tablet, TAKE 1 TABLET BY MOUTH EVERYDAY AT BEDTIME  Current Outpatient Medications (Analgesics):    aspirin EC 81 MG tablet, Take 1 tablet (81 mg total) by mouth daily. Swallow whole.   Current Outpatient Medications (Other):    calcium-vitamin D (OSCAL WITH D) 250-125 MG-UNIT tablet, Take 1 tablet by mouth daily.   pantoprazole (PROTONIX) 20 MG tablet, Take 1 tablet (20 mg total) by mouth daily.   Reviewed prior external information including notes and imaging from  primary care provider As well as notes that were available from care everywhere and other healthcare systems.  Past medical history, social, surgical and family history all reviewed in electronic medical record.  No pertanent information unless stated regarding to the chief complaint.   Review of Systems:  No headache, visual changes, nausea, vomiting, diarrhea, constipation, dizziness, abdominal pain, skin rash, fevers, chills, night sweats, weight loss, swollen lymph nodes, body aches, joint swelling, chest pain, shortness of breath, mood changes. POSITIVE muscle aches  Objective  Blood pressure 122/80, pulse 78, height '5\' 7"'$  (1.702 m), weight 206 lb  (93.4 kg), SpO2 97 %.   General: No apparent distress alert and oriented x3 mood and affect normal, dressed appropriately.  HEENT: Pupils equal, extraocular movements intact  Respiratory: Patient's speak in full sentences and does not appear short of breath  Cardiovascular: No lower extremity edema, non tender, no erythema  Shoulder: Right Inspection reveals no abnormalities, atrophy or asymmetry. Palpation is normal with no tenderness over AC joint or bicipital groove. ROM is full in all planes passively. Rotator cuff strength normal throughout. signs of impingement with positive Neer and Hawkin's tests, but negative empty can sign.  Speeds and Yergason's tests normal. No labral pathology noted with negative Obrien's, negative clunk and good stability. Normal scapular function observed. No painful arc and no drop arm sign. No apprehension sign Contralateral shoulder unremarkable  Left ankle does have some limited range of motion.  Feels to have a cystic formation noted on the anterior aspect of the ankle.  Some arthritic changes with overpronation of the hindfoot.  MSK US performed of: Right This study was ordered, performed, and interpreted by Charlann Boxer D.O.  Shoulder:   Supraspinatus: Some degenerative changes and does have some thickening of the anterior capsule Infraspinatus:  Appears normal on long and transverse views. Significant increase in Doppler flow Subscapularis:  Appears normal on long and transverse views. Positive bursa AC joint:  Capsule undistended, no geyser sign. Biceps Tendon:  Appears normal on long and transverse views, no fraying of tendon, tendon located in intertubercular groove, no subluxation with shoulder internal or external rotation.  Impression: Subacromial bursitis severe  Procedure: Real-time Ultrasound Guided Injection of right glenohumeral joint Device: GE Logiq E  Ultrasound guided injection is preferred based studies that show increased  duration, increased effect, greater accuracy, decreased procedural pain, increased response rate with ultrasound guided versus blind injection.  Verbal informed consent obtained.  Time-out conducted.  Noted no overlying erythema, induration, or other signs of local infection.  Skin prepped in a sterile fashion.  Local anesthesia: Topical Ethyl chloride.  With sterile technique and under real time ultrasound guidance:  Joint visualized.  23g 1  inch needle inserted posterior approach. Pictures taken for needle placement. Patient did have injection of  2 cc of 0.5% Marcaine, and 1.0 cc of Kenalog 40 mg/dL. Completed without difficulty  Pain immediately resolved suggesting accurate placement of the medication.  Advised to call if fevers/chills, erythema, induration, drainage, or persistent bleeding.   Impression: Technically successful ultrasound guided injection.  Procedure: Real-time Ultrasound Guided Injection of left ankle cyst Device: GE Logiq Q7 Ultrasound guided injection is preferred based studies that show increased duration, increased effect, greater accuracy, decreased procedural pain, increased response rate, and decreased cost with ultrasound guided versus blind injection.  Verbal informed consent obtained.  Time-out conducted.  Noted no overlying erythema, induration, or other signs of local infection.  Skin prepped in a sterile fashion.  Local anesthesia: Topical Ethyl chloride.  With sterile technique and under real time ultrasound guidance: With a 25-gauge half inch needle injected with 0.5 cc of 0.5% Marcaine and 0.5 cc of Kenalog 40 mg/mL. Completed without difficulty  Pain immediately resolved suggesting accurate placement of the medication.  Advised to call if fevers/chills, erythema, induration, drainage, or persistent bleeding.  Impression: Technically successful ultrasound guided injection.    Impression and Recommendations:     The above documentation has been  reviewed and is accurate and complete Lyndal Pulley, DO

## 2022-04-21 ENCOUNTER — Ambulatory Visit: Payer: Self-pay

## 2022-04-21 ENCOUNTER — Ambulatory Visit (INDEPENDENT_AMBULATORY_CARE_PROVIDER_SITE_OTHER): Payer: PPO

## 2022-04-21 ENCOUNTER — Ambulatory Visit: Payer: PPO | Admitting: Family Medicine

## 2022-04-21 VITALS — BP 122/80 | HR 78 | Ht 67.0 in | Wt 206.0 lb

## 2022-04-21 DIAGNOSIS — M25511 Pain in right shoulder: Secondary | ICD-10-CM

## 2022-04-21 DIAGNOSIS — M7551 Bursitis of right shoulder: Secondary | ICD-10-CM | POA: Diagnosis not present

## 2022-04-21 DIAGNOSIS — M71372 Other bursal cyst, left ankle and foot: Secondary | ICD-10-CM | POA: Insufficient documentation

## 2022-04-21 DIAGNOSIS — M79672 Pain in left foot: Secondary | ICD-10-CM

## 2022-04-21 NOTE — Assessment & Plan Note (Signed)
Injection given today, and does have some signs and symptoms consistent with the possibility of frozen shoulder.  We will monitor that as well.  Does have some degenerative changes of the rotator cuff we will monitor as well and if worsening symptoms advanced imaging may be warranted.  Patient did have improvement initially with the range of motion after the injection.

## 2022-04-21 NOTE — Assessment & Plan Note (Signed)
Patient an injection today and tolerated the procedure well, discussed icing regimen and home exercises, we discussed which activities to do and which ones to avoid.  Follow-up again in 6 to 8 weeks

## 2022-04-21 NOTE — Patient Instructions (Addendum)
Good to see you  Get X-ray on your way out Referral for PT sent  Given injection in right shoulder and left foot today Follow up in 8 weeks

## 2022-04-27 ENCOUNTER — Telehealth: Payer: Self-pay

## 2022-04-27 NOTE — Telephone Encounter (Signed)
Patient will hold and restart '20mg'$ 

## 2022-04-27 NOTE — Telephone Encounter (Signed)
Can hold pravastatin 40 mg p.o. daily. Once her symptoms are resolved recommend a retrial of pravastatin at a lower dose (pravastatin 20 mg p.o. nightly).  Tonya [name] [place], DO, [facility]

## 2022-04-27 NOTE — Telephone Encounter (Signed)
Patient called and said she is having leg cramps and thinks it may be he pravastatin. Please advise. Thanks

## 2022-04-28 ENCOUNTER — Ambulatory Visit (HOSPITAL_BASED_OUTPATIENT_CLINIC_OR_DEPARTMENT_OTHER): Payer: PPO | Attending: Family Medicine | Admitting: Physical Therapy

## 2022-04-28 ENCOUNTER — Encounter (HOSPITAL_BASED_OUTPATIENT_CLINIC_OR_DEPARTMENT_OTHER): Payer: Self-pay | Admitting: Physical Therapy

## 2022-04-28 ENCOUNTER — Other Ambulatory Visit: Payer: Self-pay

## 2022-04-28 DIAGNOSIS — M25572 Pain in left ankle and joints of left foot: Secondary | ICD-10-CM | POA: Insufficient documentation

## 2022-04-28 DIAGNOSIS — M79672 Pain in left foot: Secondary | ICD-10-CM | POA: Diagnosis not present

## 2022-04-28 DIAGNOSIS — M25511 Pain in right shoulder: Secondary | ICD-10-CM | POA: Diagnosis not present

## 2022-04-28 NOTE — Therapy (Signed)
OUTPATIENT PHYSICAL THERAPY LOWER EXTREMITY EVALUATION   Patient Name: Tonya [name] MRN: [id number] DOB:23-Nov-1950, 71 y.o., female Today's Date: 04/28/2022   PT End of Session - 04/28/22 2005     Visit Number 1    Number of Visits 6    Date for PT Re-Evaluation 06/05/22    Authorization Type HTA    Progress Note Due on Visit 10    PT Start Time 1315    PT Stop Time 5993    PT Time Calculation (min) 40 min    Activity Tolerance Patient tolerated treatment well    Behavior During Therapy WFL for tasks assessed/performed             Past Medical History:  Diagnosis Date   Coronary atherosclerosis due to calcified coronary lesion    History of breast cancer    Hyperlipidemia    Hypertension    Osteopenia    Past Surgical History:  Procedure Laterality Date   ABDOMINAL HYSTERECTOMY  2015   AUGMENTATION MAMMAPLASTY Left    BREAST BIOPSY Right 02/2019   BREAST IMPLANT EXCHANGE     FOOT SURGERY Left 05/25/2020   MASTECTOMY Left    REDUCTION MAMMAPLASTY Right    TONSILLECTOMY AND ADENOIDECTOMY  1958   Patient Active Problem List   Diagnosis Date Noted   Acute bursitis of right shoulder 04/21/2022   Other bursal cyst, left ankle and foot 04/21/2022   Coronary artery disease due to lipid rich plaque 09/30/2021   Hepatic steatosis 09/30/2021   Adenoma of left adrenal gland 09/30/2021   Strain of left trapezius muscle 09/30/2021   Hyperlipidemia 05/19/2021   Somatic dysfunction of spine, thoracic 03/26/2021   Chronic left-sided thoracic back pain 12/30/2020   DCIS (ductal carcinoma in situ) 01/26/2020   Low back pain 01/26/2020   Chronic pain of both knees 09/19/2019   Osteopenia    Estrogen deficiency 04/14/2018   Essential hypertension 01/10/2018   Gastroesophageal reflux disease 01/10/2018   Mild intermittent asthma without complication 57/06/7791   Pain in left lower leg 03/03/2017   Family history of colon cancer 02/23/2017   Herpes simplex  vulvovaginitis 02/18/2017   Neuropathy 02/18/2017   Elevated liver enzymes 02/17/2017   Need for Streptococcus pneumoniae vaccination 02/17/2017   Chondromalacia of left patella 12/28/2016   Synovitis of left knee 12/28/2016   Seasonal allergic rhinitis due to fungal spores 09/25/2016   Bone mass 09/24/2016   Chronic hip pain 09/24/2016   Dark yellow-colored urine 09/24/2016   Low oxygen saturation 09/24/2016   Other specified postprocedural states 01/17/2015   Abnormal stress electrocardiogram test 10/20/2013   Breast mass 12/11/2010   Family history of malignant neoplasm of breast 12/11/2010     REFERRING PROVIDER:  Lyndal Pulley, DO   REFERRING DIAG:  J03.009 (ICD-10-CM) - Acute pain of right shoulder  M79.672 (ICD-10-CM) - Left foot pain   THERAPY DIAG:  Acute pain of right shoulder  Pain in left ankle and joints of left foot  Rationale for Evaluation and Treatment: Rehabilitation  ONSET DATE: a couple of weeks  SUBJECTIVE:   SUBJECTIVE STATEMENT: Feels like somebody stepped on my foot. Pulls in plantar flexion and notes spasm at night along lateral leg. Been off of statins for 1 day. Will return to 1/2 dose if it feels better. Drained the cyst anterior to lateral maleolus. I did walk around in socks while   Corisone injection to Rt shoulder- points to biceps tendon region.   PERTINENT HISTORY:  Plantar fasciitis & neuroma removal in left foot, recent series of PT for neck/thoracic region around move.  PAIN:  Are you having pain? Feels ok right now  PRECAUTIONS: None  WEIGHT BEARING RESTRICTIONS: No  FALLS:  Has patient fallen in last 6 months? No  LIVING ENVIRONMENT: Lives with: lives with their family Lives in: House/apartment Stairs: yes  OCCUPATION: retired  PLOF: Independent  PATIENT GOALS: decrease pain  OBJECTIVE:   DIAGNOSTIC FINDINGS:  Impression: Subacromial bursitis severe - per [place] on 04/21/22  EDEMA:  Figure 8: Rt 26cm    Lt   26.5 cm Pt notes that her lateral left ankle swells as the day progresses  POSTURE: tends to rest in a slouched posture but able to demo upright posture  PALPATION: Unable to palpate concordant pain in lateral left leg Tightness in Rt upper trap  UPPER EXTREMITY ROM:  Able to demo gross WFL and slight shoulder elevation on right side notable  Upper  EXTREMITY MMT:  Good strength bilaterally but hikes as compensation on right side  GAIT: Left medial heel whip associated with hip ER at swing through   TODAY'S TREATMENT:                                                                                                                               Treatment                            EVAL  04/28/22:  Gastroc stretching STM to Rt upper trap   PATIENT EDUCATION:  Education details: Geophysicist/field seismologist of condition, POC, HEP, exercise form/rationale Person educated: Patient Education method: Explanation, Demonstration, Tactile cues, and Verbal cues Education comprehension: verbalized understanding, returned demonstration, verbal cues required, tactile cues required, and needs further education  HOME EXERCISE PROGRAM: Previous shoulder/tspine, gastroc stretching TID  ASSESSMENT:  CLINICAL IMPRESSION: Patient is a 71 y.o. F who was seen today for physical therapy evaluation and treatment for Lt lateral ankle pain and Right shoulder pain.   OBJECTIVE IMPAIRMENTS: decreased activity tolerance, difficulty walking, decreased strength, increased edema, impaired flexibility, impaired UE functional use, improper body mechanics, postural dysfunction, and pain.   ACTIVITY LIMITATIONS: carrying, lifting, standing, squatting, sleeping, stairs, hygiene/grooming, and locomotion level  PARTICIPATION LIMITATIONS: meal prep, cleaning, laundry, driving, shopping, and community activity  PERSONAL FACTORS: 1-2 comorbidities: osteopenia, h/o foot surgery and plantar fasciitis on left, previous thoracic/shoulder  pain  are also affecting patient's functional outcome.   REHAB POTENTIAL: Good  CLINICAL DECISION MAKING: Evolving/moderate complexity  EVALUATION COMPLEXITY: Moderate   GOALS: Goals reviewed with patient? Yes  SHORT TERM GOALS: Target date: 05/12/2022  Pt will verbalize awareness and correction of slouching posture Baseline: Goal status: INITIAL  LONG TERM GOALS: Target date: POC date  UE MMT without shoulder hike Baseline:  Goal status: INITIAL  2.  Gait without medial heel whip Baseline:  Goal status: INITIAL  3.  Decreased  frequency of cramping in lower leg at night Baseline:  Goal status: INITIAL  4.  Able to perform overhead motions and self care without s/s of bursitis in right shoulder Baseline:  Goal status: INITIAL     PLAN:  PT FREQUENCY: 1x/week  PT DURATION: other: 5 weeks  PLANNED INTERVENTIONS: Therapeutic exercises, Therapeutic activity, Neuromuscular re-education, Balance training, Gait training, Patient/Family education, Self Care, Joint mobilization, Stair training, Aquatic Therapy, Dry Needling, Electrical stimulation, Spinal mobilization, Cryotherapy, Moist heat, Taping, Ionotophoresis '4mg'$ /ml Dexamethasone, and Manual therapy  PLAN FOR NEXT SESSION: check mid foot mobility, DN upper trap   Munira Polson C. Takumi Din PT, DPT 04/28/22 8:35 PM

## 2022-04-30 ENCOUNTER — Ambulatory Visit: Payer: PPO | Admitting: Podiatry

## 2022-05-04 ENCOUNTER — Encounter: Payer: Self-pay | Admitting: Cardiology

## 2022-05-04 ENCOUNTER — Encounter: Payer: Self-pay | Admitting: Family Medicine

## 2022-05-04 ENCOUNTER — Other Ambulatory Visit: Payer: Self-pay

## 2022-05-04 MED ORDER — PITAVASTATIN CALCIUM 2 MG PO TABS: 2.0000 mg | ORAL_TABLET | Freq: Every day | ORAL | 3 refills | Status: DC

## 2022-05-04 NOTE — Telephone Encounter (Signed)
From patient.

## 2022-05-04 NOTE — Telephone Encounter (Signed)
Have her stop taking pravastatin and in 3 days start new medication, pitavastatin (Livalo) '2mg'$  daily. I have called this in to her pharmacy. She may come by the office and pick up samples if needed.

## 2022-05-04 NOTE — Telephone Encounter (Signed)
ST Patient

## 2022-05-05 NOTE — Telephone Encounter (Signed)
I will place an order for lipid panel to review her cholesterol levels. I am not sure about the dark urine, is she having UTI symptoms? If so she may need to contact her PCP to have a urine specimen collected.

## 2022-05-05 NOTE — Telephone Encounter (Signed)
From pt

## 2022-05-06 ENCOUNTER — Other Ambulatory Visit: Payer: Self-pay

## 2022-05-06 DIAGNOSIS — I10 Essential (primary) hypertension: Secondary | ICD-10-CM

## 2022-05-06 DIAGNOSIS — E7800 Pure hypercholesterolemia, unspecified: Secondary | ICD-10-CM

## 2022-05-06 NOTE — Telephone Encounter (Signed)
From pt

## 2022-05-08 ENCOUNTER — Ambulatory Visit (INDEPENDENT_AMBULATORY_CARE_PROVIDER_SITE_OTHER): Payer: PPO | Admitting: Family Medicine

## 2022-05-08 ENCOUNTER — Encounter: Payer: Self-pay | Admitting: Family Medicine

## 2022-05-08 VITALS — BP 128/78 | HR 72 | Temp 98.1°F | Ht 67.0 in | Wt 202.0 lb

## 2022-05-08 DIAGNOSIS — R252 Cramp and spasm: Secondary | ICD-10-CM

## 2022-05-08 DIAGNOSIS — E7800 Pure hypercholesterolemia, unspecified: Secondary | ICD-10-CM

## 2022-05-08 LAB — COMPREHENSIVE METABOLIC PANEL
ALT: 16 U/L (ref 0–35)
AST: 20 U/L (ref 0–37)
Albumin: 4.5 g/dL (ref 3.5–5.2)
Alkaline Phosphatase: 64 U/L (ref 39–117)
BUN: 19 mg/dL (ref 6–23)
CO2: 33 mEq/L — ABNORMAL HIGH (ref 19–32)
Calcium: 9.6 mg/dL (ref 8.4–10.5)
Chloride: 102 mEq/L (ref 96–112)
Creatinine, Ser: 0.89 mg/dL (ref 0.40–1.20)
GFR: 65.73 mL/min (ref >60.00)
Glucose, Bld: 88 mg/dL (ref 70–99)
Potassium: 4.5 mEq/L (ref 3.5–5.1)
Sodium: 141 mEq/L (ref 135–145)
Total Bilirubin: 1 mg/dL (ref 0.2–1.2)
Total Protein: 7.1 g/dL (ref 6.0–8.3)

## 2022-05-08 LAB — LIPID PANEL
Cholesterol: 206 mg/dL — ABNORMAL HIGH (ref 0–200)
HDL: 75.1 mg/dL (ref >39.00)
LDL Cholesterol: 113 mg/dL — ABNORMAL HIGH (ref 0–99)
NonHDL: 130.97
Total CHOL/HDL Ratio: 3
Triglycerides: 90 mg/dL (ref 0.0–149.0)
VLDL: 18 mg/dL (ref 0.0–40.0)

## 2022-05-08 LAB — CBC
HCT: 42.2 % (ref 36.0–46.0)
Hemoglobin: 14 g/dL (ref 12.0–15.0)
MCHC: 33.1 g/dL (ref 30.0–36.0)
MCV: 101.1 fl — ABNORMAL HIGH (ref 78.0–100.0)
Platelets: 177 10*3/uL (ref 150.0–400.0)
RBC: 4.18 Mil/uL (ref 3.87–5.11)
RDW: 13.2 % (ref 11.5–15.5)
WBC: 10.7 10*3/uL — ABNORMAL HIGH (ref 4.0–10.5)

## 2022-05-08 LAB — HEMOGLOBIN A1C: Hgb A1c MFr Bld: 5.7 % (ref 4.6–6.5)

## 2022-05-08 LAB — MAGNESIUM: Magnesium: 1.9 mg/dL (ref 1.5–2.5)

## 2022-05-08 NOTE — Progress Notes (Signed)
Chief Complaint  Patient presents with   discuss statin medication    Subjective: Hyperlipidemia Patient presents for Hyperlipidemia follow up. Currently taking nothing (failed Crestor/Lipitor/maybe pravastatin) and compliance with treatment thus far has been good. She complains of myalgias. Mainly more freq cramping at night of both legs.  She is adhering to a healthy diet. Exercise: walking The patient is known to have coexisting coronary artery disease.  Past Medical History:  Diagnosis Date   Coronary atherosclerosis due to calcified coronary lesion    History of breast cancer    Hyperlipidemia    Hypertension    Osteopenia     Objective: BP 128/78 (BP Location: Left Arm, Patient Position: Sitting, Cuff Size: Normal)   Pulse 72   Temp 98.1 F (36.7 C) (Oral)   Ht '5\' 7"'$  (1.702 m)   Wt 202 lb (91.6 kg)   SpO2 97%   BMI 31.64 kg/m  General: Awake, appears stated age HEENT: MMM Heart: RRR, no LE edema, no bruits Lungs: CTAB, no rales, wheezes or rhonchi. No accessory muscle use MSK: No ttp over the lower extremity msc b/l Psych: Age appropriate judgment and insight, normal affect and mood  Assessment and Plan: Pure hypercholesterolemia - Plan: Comprehensive metabolic panel, Lipid panel  Cramp of both lower extremities - Plan: CBC, Hemoglobin A1c, Magnesium  Ck above. Known plaque in coronaries sp CACS. Failed several statins. Stay hydrated. Will recommend lovastatin 10 mg/d if labs WNL. Counseled on diet/exercise, has done a nice job losing wt.  Ck labs. If WNL, stay hydrated, consider pickle juice nightly. Seemingly still happening despite being off of statin for 2 weeks, doubt statin myalgia.  F/u pending above. The patient voiced understanding and agreement to the plan.  [place], DO 05/08/22  12:46 PM

## 2022-05-08 NOTE — Patient Instructions (Signed)
Give us 2-3 business days to get the results of your labs back.   Keep the diet clean and stay active.  Let us know if you need anything. 

## 2022-05-09 ENCOUNTER — Encounter: Payer: Self-pay | Admitting: Family Medicine

## 2022-05-09 ENCOUNTER — Other Ambulatory Visit: Payer: Self-pay | Admitting: Family Medicine

## 2022-05-09 MED ORDER — LOVASTATIN 10 MG PO TABS: 10.0000 mg | ORAL_TABLET | Freq: Every day | ORAL | 3 refills | Status: DC

## 2022-05-10 DIAGNOSIS — Z20822 Contact with and (suspected) exposure to covid-19: Secondary | ICD-10-CM | POA: Diagnosis not present

## 2022-05-11 ENCOUNTER — Telehealth: Payer: Self-pay | Admitting: Family Medicine

## 2022-05-11 ENCOUNTER — Ambulatory Visit (HOSPITAL_BASED_OUTPATIENT_CLINIC_OR_DEPARTMENT_OTHER): Payer: PPO | Admitting: Physical Therapy

## 2022-05-11 ENCOUNTER — Ambulatory Visit: Payer: PPO | Admitting: Family Medicine

## 2022-05-11 ENCOUNTER — Other Ambulatory Visit: Payer: Self-pay | Admitting: Family Medicine

## 2022-05-11 ENCOUNTER — Other Ambulatory Visit: Payer: PPO

## 2022-05-11 DIAGNOSIS — E538 Deficiency of other specified B group vitamins: Secondary | ICD-10-CM

## 2022-05-11 NOTE — Telephone Encounter (Signed)
From patient.

## 2022-05-11 NOTE — Telephone Encounter (Signed)
Nurse Assessment Nurse: Micki Riley, RN, Domenick Gong Date/Time (Eastern Time): 05/11/2022 11:53:27 AM Confirm and document reason for call. If symptomatic, describe symptoms. ---Caller states she has been having severe bl leg cramping (not now) & left arm had not been able to move fingers (some arm pain & pinky finger now). Was advised to stop taking statin for a week per cardiologist. States she has been placed on another statin. States she has been having a cold since Saturday. Denies recent injury or any other symptoms. Does the patient have any new or worsening symptoms? ---Yes Will a triage be completed? ---Yes Related visit to physician within the last 2 weeks? ---Yes Does the PT have any chronic conditions? (i.e. diabetes, asthma, this includes High risk factors for pregnancy, etc.) ---Unknown Is this a behavioral health or substance abuse call? ---No Guidelines Guideline Title Affirmed Question Affirmed Notes Nurse Date/Time (Eastern Time) Arm Pain Weakness (i.e., loss of strength) in hand or fingers (Exception: Not truly weak; hand feels weak because of pain.) [place], RN, Domenick Gong 05/11/2022 11:57:29 AM PLEASE NOTE: All timestamps contained within this report are represented as [place] Standard Time. CONFIDENTIALTY NOTICE: This fax transmission is intended only for the addressee. It contains information that is legally privileged, confidential or otherwise protected from use or disclosure. If you are not the intended recipient, you are strictly prohibited from reviewing, disclosing, copying using or disseminating any of this information or taking any action in reliance on or regarding this information. If you have received this fax in error, please notify us immediately by telephone so that we can arrange for its return to [place]. Phone: [phone], Toll-Free: [phone], Fax: [phone] Page: 2 of 2 Call Id: [id number] [place]. Time Eilene Ghazi Time) Disposition  Final User 05/11/2022 12:00:10 PM See HCP within 4 Hours (or PCP triage) Yes Micki Riley, RN, Domenick Gong Final Disposition 05/11/2022 12:00:10 PM See HCP within 4 Hours (or PCP triage) Yes Micki Riley, RN, [place] Disagree/Comply Comply Caller Understands Yes PreDisposition Call Doctor Care Advice Given Per Guideline SEE HCP (OR PCP TRIAGE) WITHIN 4 HOURS: CARE ADVICE given per Arm Pain (Adult) guideline. CALL BACK IF: * You become worse Comments User: Domenick Gong, Micki Riley, RN Date/Time Eilene Ghazi Time): 05/11/2022 12:08:10 PM Spoke w/Jessica at office backline [phone]; provided patient information, sx, & recommended outcome. States she will have to speak w/practice manager to see if there is availability for caller to be seen w/in outcome timeframe, will call patient back. Caller made aware. Referrals REFERRED TO PCP OFFICE Warm transfer to backline

## 2022-05-11 NOTE — Telephone Encounter (Signed)
Since she has been off her statin medication this is probably the cause of elevation in LDL and total cholesterol. I am ok with her trying Lovastatin as prescribed by her GP and if she tolerates this would have lipids rechecked in 6 months.

## 2022-05-11 NOTE — Telephone Encounter (Signed)
Pt called triage line stating she was experiencing numbness in her left arm, pt started from her left elbow all the way down to her fingers was completely crippled,left pinkie finger still numb.  Spoke with PCP he said to schedule her for an OV at 11:45am on 05/12/22 and Pt agreed on appointment time.

## 2022-05-11 NOTE — Telephone Encounter (Signed)
Pt called on triage line stated that she had 3 nights of no cramping, unt

## 2022-05-11 NOTE — Addendum Note (Signed)
Addended by: Jodi Marble, Marissah Vandemark I on: 05/11/2022 09:38 AM   Modules accepted: Orders

## 2022-05-12 ENCOUNTER — Ambulatory Visit (INDEPENDENT_AMBULATORY_CARE_PROVIDER_SITE_OTHER): Payer: PPO | Admitting: Family Medicine

## 2022-05-12 ENCOUNTER — Encounter: Payer: Self-pay | Admitting: Family Medicine

## 2022-05-12 VITALS — BP 120/80 | HR 74 | Temp 97.9°F | Ht 67.0 in | Wt 201.0 lb

## 2022-05-12 DIAGNOSIS — M79602 Pain in left arm: Secondary | ICD-10-CM | POA: Diagnosis not present

## 2022-05-12 LAB — B12 AND FOLATE PANEL
Folate: >24 ng/mL
Vitamin B-12: 311 pg/mL (ref 200–1100)

## 2022-05-12 NOTE — Patient Instructions (Addendum)
Stay hydrated.   EKG is reassuring today.  Stay on the aspirin try to get going with the lovastatin.  Let us know if you need anything.

## 2022-05-12 NOTE — Progress Notes (Signed)
Musculoskeletal Exam  Patient: Tonya [name] [id number] DOB: 1951/05/21  DOS: 05/12/2022  SUBJECTIVE:  Chief Complaint:   Chief Complaint  Patient presents with   Arm Pain    Left Arm pain/tightness/hand     PERNIE GROSSO is a 71 y.o.  female for evaluation and treatment of L arm pain. Here w spouse.   Onset:  1 d ago. No inj or change in activity.  Had spasming of L hand/forearm Location: dorsum of L forearm into hand Character:  tingling and tightness   Progression of issue:  has not had since Associated symptoms: No bruising, redness, swelling, SOB, CP, weakness Treatment: to date has been none.   Neurovascular symptoms: no  Past Medical History:  Diagnosis Date   Coronary atherosclerosis due to calcified coronary lesion    History of breast cancer    Hyperlipidemia    Hypertension    Osteopenia     Objective: VITAL SIGNS: BP 120/80 (BP Location: Right Arm, Patient Position: Sitting, Cuff Size: Normal)   Pulse 74   Temp 97.9 F (36.6 C) (Oral)   Ht '5\' 7"'$  (1.702 m)   Wt 201 lb (91.2 kg)   SpO2 96%   BMI 31.48 kg/m  Constitutional: Well formed, well developed. No acute distress. Thorax & Lungs: No accessory muscle use Musculoskeletal: LUE.   Normal active range of motion: yes.   Normal passive range of motion: yes Tenderness to palpation: no Deformity: no Ecchymosis: no Neurologic: Normal sensory function. No focal deficits noted. DTR's equal and symmetric throughout. No clonus. 5/5 strength throughout. Sensation intact to light touch in hands b/l.  Psychiatric: Normal mood. Age appropriate judgment and insight. Alert & oriented x 3.    Assessment:  Left arm pain - Plan: EKG 12-Lead  Plan: Sounds like spasming. Not exertional. Stay hydrated. Consider pickle juice/mustard (she does not like this so probably won't). EKG: EKG shows NSR, normal axis, no interval abnormalities, no ST segment or T wave changes, good R wave progression.  F/u prn. The patient and  her spouse voiced understanding and agreement to the plan.   [place], DO 05/12/22  11:59 AM

## 2022-05-21 ENCOUNTER — Other Ambulatory Visit: Payer: Self-pay | Admitting: Family Medicine

## 2022-05-21 DIAGNOSIS — Z853 Personal history of malignant neoplasm of breast: Secondary | ICD-10-CM

## 2022-05-21 DIAGNOSIS — Z1231 Encounter for screening mammogram for malignant neoplasm of breast: Secondary | ICD-10-CM

## 2022-05-22 ENCOUNTER — Telehealth: Payer: Self-pay | Admitting: Family Medicine

## 2022-05-22 ENCOUNTER — Other Ambulatory Visit: Payer: Self-pay

## 2022-05-22 DIAGNOSIS — M25511 Pain in right shoulder: Secondary | ICD-10-CM

## 2022-05-22 NOTE — Telephone Encounter (Signed)
Added order in for MRI.

## 2022-05-22 NOTE — Telephone Encounter (Signed)
See message 05/05/2022, Tonya [name] shoulder still occasionally painful and affects her sleep on a nightly. Based on how long this has been going on, Tonya [name] would be willing to do a MRI if Dr. Tamala Julian agrees.

## 2022-05-25 NOTE — Telephone Encounter (Signed)
Pt notified of MRI order via [place].

## 2022-06-01 ENCOUNTER — Encounter (HOSPITAL_BASED_OUTPATIENT_CLINIC_OR_DEPARTMENT_OTHER): Payer: PPO | Admitting: Physical Therapy

## 2022-06-09 ENCOUNTER — Encounter (HOSPITAL_BASED_OUTPATIENT_CLINIC_OR_DEPARTMENT_OTHER): Payer: PPO | Admitting: Physical Therapy

## 2022-06-09 NOTE — Progress Notes (Deleted)
Tonya [name] Phone: [phone] Subjective:    I'm seeing this patient by the request  of:  [name], DO  CC:   [id number]  04/21/2022 Patient an injection today and tolerated the procedure well, discussed icing regimen and home exercises, we discussed which activities to do and which ones to avoid.  Follow-up again in 6 to 8 weeks      Injection given today, and does have some signs and symptoms consistent with the possibility of frozen shoulder.  We will monitor that as well.  Does have some degenerative changes of the rotator cuff we will monitor as well and if worsening symptoms advanced imaging may be warranted.  Patient did have improvement initially with the range of motion after the injection.      Update 06/17/2022 Tonya [name] is a 71 y.o. female coming in with complaint of L ankle and R shoulder pain. Patient states      Past Medical History:  Diagnosis Date   Coronary atherosclerosis due to calcified coronary lesion    History of breast cancer    Hyperlipidemia    Hypertension    Osteopenia    Past Surgical History:  Procedure Laterality Date   ABDOMINAL HYSTERECTOMY  2015   AUGMENTATION MAMMAPLASTY Left    BREAST BIOPSY Right 02/2019   BREAST IMPLANT EXCHANGE     FOOT SURGERY Left 05/25/2020   MASTECTOMY Left    REDUCTION MAMMAPLASTY Right    TONSILLECTOMY AND ADENOIDECTOMY  1958   Social History   Socioeconomic History   Marital status: Married    Spouse name: Not on file   Number of children: 1   Years of education: Not on file   Highest education level: Not on file  Occupational History   Not on file  Tobacco Use   Smoking status: Never   Smokeless tobacco: Never  Vaping Use   Vaping Use: Never used  Substance and Sexual Activity   Alcohol use: Yes    Comment: rarely   Drug use: Never   Sexual activity: Yes    Birth control/protection: None  Other  Topics Concern   Not on file  Social History Narrative   Not on file   Social Determinants of Health   Financial Resource Strain: Low Risk  (11/04/2021)   Overall Financial Resource Strain (CARDIA)    Difficulty of Paying Living Expenses: Not hard at all  Food Insecurity: No Food Insecurity (11/04/2021)   Hunger Vital Sign    Worried About Running Out of Food in the Last Year: Never true    [place] in the Last Year: Never true  Transportation Needs: No Transportation Needs (11/04/2021)   PRAPARE - Hydrologist (Medical): No    Lack of Transportation (Non-Medical): No  Physical Activity: Inactive (11/04/2021)   Exercise Vital Sign    Days of Exercise per Week: 0 days    Minutes of Exercise per Session: 0 min  Stress: No Stress Concern Present (11/04/2021)   [place]    Feeling of Stress : Not at all  Social Connections: Moderately Isolated (11/04/2021)   Social Connection and Isolation Panel [NHANES]    Frequency of Communication with Friends and Family: More than three times a week    Frequency of Social Gatherings with Friends and Family: Twice a week    Attends Religious Services:  Never    Active Member of Clubs or Organizations: No    Attends Archivist Meetings: Never    Marital Status: Married   Allergies  Allergen Reactions   Crestor [Rosuvastatin]     Myalgias    Lipitor [Atorvastatin]     myalgias   Family History  Problem Relation Age of Onset   Heart attack Mother    Heart attack Father        died from   Lung disease Brother    Cancer Brother    Breast cancer Maternal Aunt    Breast cancer Paternal Aunt      Current Outpatient Medications (Cardiovascular):    atenolol (TENORMIN) 50 MG tablet,    lovastatin (MEVACOR) 10 MG tablet, Take 1 tablet (10 mg total) by mouth at bedtime.  Current Outpatient Medications (Respiratory):    montelukast  (SINGULAIR) 10 MG tablet, TAKE 1 TABLET BY MOUTH EVERYDAY AT BEDTIME  Current Outpatient Medications (Analgesics):    aspirin EC 81 MG tablet, Take 1 tablet (81 mg total) by mouth daily. Swallow whole.   Current Outpatient Medications (Other):    calcium-vitamin D (OSCAL WITH D) 250-125 MG-UNIT tablet, Take 1 tablet by mouth daily.   pantoprazole (PROTONIX) 20 MG tablet, Take 1 tablet (20 mg total) by mouth daily.   Reviewed prior external information including notes and imaging from  primary care provider As well as notes that were available from care everywhere and other healthcare systems.  Past medical history, social, surgical and family history all reviewed in electronic medical record.  No pertanent information unless stated regarding to the chief complaint.   Review of Systems:  No headache, visual changes, nausea, vomiting, diarrhea, constipation, dizziness, abdominal pain, skin rash, fevers, chills, night sweats, weight loss, swollen lymph nodes, body aches, joint swelling, chest pain, shortness of breath, mood changes. POSITIVE muscle aches  Objective  There were no vitals taken for this visit.   General: No apparent distress alert and oriented x3 mood and affect normal, dressed appropriately.  HEENT: Pupils equal, extraocular movements intact  Respiratory: Patient's speak in full sentences and does not appear short of breath  Cardiovascular: No lower extremity edema, non tender, no erythema      Impression and Recommendations:

## 2022-06-11 ENCOUNTER — Ambulatory Visit
Admission: RE | Admit: 2022-06-11 | Discharge: 2022-06-11 | Disposition: A | Discharge status: Home / Self Care | Payer: PPO | Source: Ambulatory Visit | Attending: Family Medicine | Admitting: Family Medicine

## 2022-06-11 DIAGNOSIS — M25511 Pain in right shoulder: Secondary | ICD-10-CM

## 2022-06-12 ENCOUNTER — Other Ambulatory Visit: Payer: Self-pay | Admitting: *Deleted

## 2022-06-12 DIAGNOSIS — E7800 Pure hypercholesterolemia, unspecified: Secondary | ICD-10-CM

## 2022-06-16 ENCOUNTER — Encounter (HOSPITAL_BASED_OUTPATIENT_CLINIC_OR_DEPARTMENT_OTHER): Payer: PPO | Admitting: Physical Therapy

## 2022-06-17 ENCOUNTER — Ambulatory Visit: Payer: PPO | Admitting: Family Medicine

## 2022-06-17 ENCOUNTER — Other Ambulatory Visit: Payer: Self-pay

## 2022-06-17 DIAGNOSIS — M25511 Pain in right shoulder: Secondary | ICD-10-CM

## 2022-06-18 ENCOUNTER — Other Ambulatory Visit: Payer: Self-pay | Admitting: Family Medicine

## 2022-06-19 ENCOUNTER — Other Ambulatory Visit (INDEPENDENT_AMBULATORY_CARE_PROVIDER_SITE_OTHER): Payer: PPO

## 2022-06-19 DIAGNOSIS — E7800 Pure hypercholesterolemia, unspecified: Secondary | ICD-10-CM

## 2022-06-19 LAB — LIPID PANEL
Cholesterol: 184 mg/dL (ref 0–200)
HDL: 75.3 mg/dL (ref >39.00)
LDL Cholesterol: 90 mg/dL (ref 0–99)
NonHDL: 108.68
Total CHOL/HDL Ratio: 2
Triglycerides: 93 mg/dL (ref 0.0–149.0)
VLDL: 18.6 mg/dL (ref 0.0–40.0)

## 2022-06-19 LAB — HEPATIC FUNCTION PANEL
ALT: 14 U/L (ref 0–35)
AST: 19 U/L (ref 0–37)
Albumin: 4.3 g/dL (ref 3.5–5.2)
Alkaline Phosphatase: 61 U/L (ref 39–117)
Bilirubin, Direct: 0.2 mg/dL (ref 0.0–0.3)
Total Bilirubin: 0.8 mg/dL (ref 0.2–1.2)
Total Protein: 6.8 g/dL (ref 6.0–8.3)

## 2022-06-24 ENCOUNTER — Other Ambulatory Visit: Payer: Self-pay | Admitting: Family Medicine

## 2022-06-24 DIAGNOSIS — J4520 Mild intermittent asthma, uncomplicated: Secondary | ICD-10-CM

## 2022-06-24 DIAGNOSIS — M75121 Complete rotator cuff tear or rupture of right shoulder, not specified as traumatic: Secondary | ICD-10-CM | POA: Diagnosis not present

## 2022-06-25 ENCOUNTER — Telehealth: Payer: Self-pay | Admitting: Family Medicine

## 2022-06-25 NOTE — Telephone Encounter (Signed)
Pt states we should be receiving a form from [place] or Dr.Chandler's office clearing her for shoulder surgery. She was not sure if they were going to fax this but just wanted a message sent as an fyi.

## 2022-06-26 NOTE — Telephone Encounter (Signed)
Have not seen it yet,but once do will schedule Pre-op appt.

## 2022-07-16 DIAGNOSIS — G8918 Other acute postprocedural pain: Secondary | ICD-10-CM | POA: Diagnosis not present

## 2022-07-16 DIAGNOSIS — X58XXXA Exposure to other specified factors, initial encounter: Secondary | ICD-10-CM | POA: Diagnosis not present

## 2022-07-16 DIAGNOSIS — Y999 Unspecified external cause status: Secondary | ICD-10-CM | POA: Diagnosis not present

## 2022-07-16 DIAGNOSIS — S46011A Strain of muscle(s) and tendon(s) of the rotator cuff of right shoulder, initial encounter: Secondary | ICD-10-CM | POA: Diagnosis not present

## 2022-07-17 ENCOUNTER — Ambulatory Visit: Payer: PPO

## 2022-07-17 HISTORY — PX: ROTATOR CUFF REPAIR: SHX139

## 2022-07-21 DIAGNOSIS — S46011A Strain of muscle(s) and tendon(s) of the rotator cuff of right shoulder, initial encounter: Secondary | ICD-10-CM | POA: Diagnosis not present

## 2022-07-31 ENCOUNTER — Ambulatory Visit: Payer: PPO | Admitting: Family Medicine

## 2022-08-01 ENCOUNTER — Other Ambulatory Visit: Payer: Self-pay | Admitting: Family Medicine

## 2022-08-19 DIAGNOSIS — S46011A Strain of muscle(s) and tendon(s) of the rotator cuff of right shoulder, initial encounter: Secondary | ICD-10-CM | POA: Diagnosis not present

## 2022-08-24 ENCOUNTER — Encounter: Payer: Self-pay | Admitting: Family Medicine

## 2022-08-24 ENCOUNTER — Ambulatory Visit (INDEPENDENT_AMBULATORY_CARE_PROVIDER_SITE_OTHER): Payer: PPO | Admitting: Family Medicine

## 2022-08-24 VITALS — BP 112/72 | HR 69 | Temp 98.1°F | Ht 67.0 in | Wt 205.1 lb

## 2022-08-24 DIAGNOSIS — I10 Essential (primary) hypertension: Secondary | ICD-10-CM

## 2022-08-24 DIAGNOSIS — K219 Gastro-esophageal reflux disease without esophagitis: Secondary | ICD-10-CM | POA: Diagnosis not present

## 2022-08-24 NOTE — Progress Notes (Signed)
Chief Complaint  Patient presents with   Follow-up    6 month Recovering from shoulder surgery     Subjective Tonya [name] is a 72 y.o. female who presents for hypertension follow up. She does not monitor home blood pressures. She is compliant with medication- losartan 50 mg/d, atenolol 50 mg/d. Patient has these side effects of medication: none She is usually adhering to a healthy diet overall. Current exercise: walking No CP or SOB.   GERD Takes Protonix 20 mg/d.  She denies fullness after meals, belching and eructation, abdominal bloating, heartburn, bilious reflux, nocturnal burning, waterbrash, upper abdominal discomfort Aggravating factors and specific triggers include  peppers, spicy foods . Alleviating factors include proton pump inhibitors Risk factors present for GERD include obesity.  No bleeding, difficulty swallowing.   Dry mouth at night since her surgery in January. She does snore at night. No gasping for air.    Past Medical History:  Diagnosis Date   Coronary atherosclerosis due to calcified coronary lesion    History of breast cancer    Hyperlipidemia    Hypertension    Osteopenia     Exam BP 112/72 (BP Location: Left Arm, Patient Position: Sitting, Cuff Size: Normal)   Pulse 69   Temp 98.1 F (36.7 C) (Oral)   Ht '5\' 7"'$  (1.702 m)   Wt 205 lb 2 oz (93 kg)   SpO2 93%   BMI 32.13 kg/m  General:  well developed, well nourished, in no apparent distress Heart: RRR, no bruits, no LE edema Lungs: clear to auscultation, no accessory muscle use Abd: BS+, S, NT, ND Psych: well oriented with normal range of affect and appropriate judgment/insight  Essential hypertension  Gastroesophageal reflux disease, unspecified whether esophagitis present  Chronic, stable. Cont losartan 50 mg/d, atenolol 50 mg/d. Counseled on diet and exercise. Chronic, stable. Cont Protonix 20 mg/d.  Will try humidifier and Biotene mouthwash for dry mouth at night. Could  consider sleep study.  F/u in 6 mo for CPE.  The patient voiced understanding and agreement to the plan.  [place], DO 08/24/22  8:52 AM

## 2022-08-24 NOTE — Patient Instructions (Addendum)
Try 2 tablespoons of milk of mag in 4 oz of warm prune juice. Do that and wait a couple hours. If no improvement, try a Dulcolax suppository and then let me know if we are still having issues.   Consider Biotene Mouthwash to help your dry mouth.   Keep the diet clean and stay active.  Let us know if you need anything.

## 2022-08-25 DIAGNOSIS — S46011A Strain of muscle(s) and tendon(s) of the rotator cuff of right shoulder, initial encounter: Secondary | ICD-10-CM | POA: Diagnosis not present

## 2022-08-25 DIAGNOSIS — M25511 Pain in right shoulder: Secondary | ICD-10-CM | POA: Diagnosis not present

## 2022-08-25 DIAGNOSIS — M25611 Stiffness of right shoulder, not elsewhere classified: Secondary | ICD-10-CM | POA: Diagnosis not present

## 2022-08-25 DIAGNOSIS — Z4789 Encounter for other orthopedic aftercare: Secondary | ICD-10-CM | POA: Diagnosis not present

## 2022-08-26 DIAGNOSIS — Z4789 Encounter for other orthopedic aftercare: Secondary | ICD-10-CM | POA: Diagnosis not present

## 2022-08-26 DIAGNOSIS — M25511 Pain in right shoulder: Secondary | ICD-10-CM | POA: Diagnosis not present

## 2022-08-26 DIAGNOSIS — S46011A Strain of muscle(s) and tendon(s) of the rotator cuff of right shoulder, initial encounter: Secondary | ICD-10-CM | POA: Diagnosis not present

## 2022-08-26 DIAGNOSIS — M25611 Stiffness of right shoulder, not elsewhere classified: Secondary | ICD-10-CM | POA: Diagnosis not present

## 2022-09-01 DIAGNOSIS — S46011A Strain of muscle(s) and tendon(s) of the rotator cuff of right shoulder, initial encounter: Secondary | ICD-10-CM | POA: Diagnosis not present

## 2022-09-01 DIAGNOSIS — Z4789 Encounter for other orthopedic aftercare: Secondary | ICD-10-CM | POA: Diagnosis not present

## 2022-09-01 DIAGNOSIS — M25511 Pain in right shoulder: Secondary | ICD-10-CM | POA: Diagnosis not present

## 2022-09-01 DIAGNOSIS — M25611 Stiffness of right shoulder, not elsewhere classified: Secondary | ICD-10-CM | POA: Diagnosis not present

## 2022-09-03 DIAGNOSIS — Z4789 Encounter for other orthopedic aftercare: Secondary | ICD-10-CM | POA: Diagnosis not present

## 2022-09-03 DIAGNOSIS — M25511 Pain in right shoulder: Secondary | ICD-10-CM | POA: Diagnosis not present

## 2022-09-03 DIAGNOSIS — S46011A Strain of muscle(s) and tendon(s) of the rotator cuff of right shoulder, initial encounter: Secondary | ICD-10-CM | POA: Diagnosis not present

## 2022-09-03 DIAGNOSIS — M25611 Stiffness of right shoulder, not elsewhere classified: Secondary | ICD-10-CM | POA: Diagnosis not present

## 2022-09-07 DIAGNOSIS — M25511 Pain in right shoulder: Secondary | ICD-10-CM | POA: Diagnosis not present

## 2022-09-07 DIAGNOSIS — S46011A Strain of muscle(s) and tendon(s) of the rotator cuff of right shoulder, initial encounter: Secondary | ICD-10-CM | POA: Diagnosis not present

## 2022-09-07 DIAGNOSIS — M25611 Stiffness of right shoulder, not elsewhere classified: Secondary | ICD-10-CM | POA: Diagnosis not present

## 2022-09-07 DIAGNOSIS — Z4789 Encounter for other orthopedic aftercare: Secondary | ICD-10-CM | POA: Diagnosis not present

## 2022-09-10 DIAGNOSIS — M25511 Pain in right shoulder: Secondary | ICD-10-CM | POA: Diagnosis not present

## 2022-09-10 DIAGNOSIS — M25611 Stiffness of right shoulder, not elsewhere classified: Secondary | ICD-10-CM | POA: Diagnosis not present

## 2022-09-10 DIAGNOSIS — Z4789 Encounter for other orthopedic aftercare: Secondary | ICD-10-CM | POA: Diagnosis not present

## 2022-09-10 DIAGNOSIS — S46011A Strain of muscle(s) and tendon(s) of the rotator cuff of right shoulder, initial encounter: Secondary | ICD-10-CM | POA: Diagnosis not present

## 2022-09-12 ENCOUNTER — Other Ambulatory Visit: Payer: Self-pay | Admitting: Cardiology

## 2022-09-12 DIAGNOSIS — I2510 Atherosclerotic heart disease of native coronary artery without angina pectoris: Secondary | ICD-10-CM

## 2022-09-14 DIAGNOSIS — M25511 Pain in right shoulder: Secondary | ICD-10-CM | POA: Diagnosis not present

## 2022-09-14 DIAGNOSIS — Z4789 Encounter for other orthopedic aftercare: Secondary | ICD-10-CM | POA: Diagnosis not present

## 2022-09-14 DIAGNOSIS — M25611 Stiffness of right shoulder, not elsewhere classified: Secondary | ICD-10-CM | POA: Diagnosis not present

## 2022-09-14 DIAGNOSIS — S46011A Strain of muscle(s) and tendon(s) of the rotator cuff of right shoulder, initial encounter: Secondary | ICD-10-CM | POA: Diagnosis not present

## 2022-09-16 DIAGNOSIS — S46011A Strain of muscle(s) and tendon(s) of the rotator cuff of right shoulder, initial encounter: Secondary | ICD-10-CM | POA: Diagnosis not present

## 2022-09-16 DIAGNOSIS — M25611 Stiffness of right shoulder, not elsewhere classified: Secondary | ICD-10-CM | POA: Diagnosis not present

## 2022-09-16 DIAGNOSIS — Z4789 Encounter for other orthopedic aftercare: Secondary | ICD-10-CM | POA: Diagnosis not present

## 2022-09-16 DIAGNOSIS — M25511 Pain in right shoulder: Secondary | ICD-10-CM | POA: Diagnosis not present

## 2022-09-21 DIAGNOSIS — S46011A Strain of muscle(s) and tendon(s) of the rotator cuff of right shoulder, initial encounter: Secondary | ICD-10-CM | POA: Diagnosis not present

## 2022-09-21 DIAGNOSIS — M25511 Pain in right shoulder: Secondary | ICD-10-CM | POA: Diagnosis not present

## 2022-09-21 DIAGNOSIS — Z4789 Encounter for other orthopedic aftercare: Secondary | ICD-10-CM | POA: Diagnosis not present

## 2022-09-21 DIAGNOSIS — M25611 Stiffness of right shoulder, not elsewhere classified: Secondary | ICD-10-CM | POA: Diagnosis not present

## 2022-09-24 DIAGNOSIS — S46011A Strain of muscle(s) and tendon(s) of the rotator cuff of right shoulder, initial encounter: Secondary | ICD-10-CM | POA: Diagnosis not present

## 2022-09-24 DIAGNOSIS — M25611 Stiffness of right shoulder, not elsewhere classified: Secondary | ICD-10-CM | POA: Diagnosis not present

## 2022-09-24 DIAGNOSIS — Z4789 Encounter for other orthopedic aftercare: Secondary | ICD-10-CM | POA: Diagnosis not present

## 2022-09-24 DIAGNOSIS — M25511 Pain in right shoulder: Secondary | ICD-10-CM | POA: Diagnosis not present

## 2022-09-28 DIAGNOSIS — Z4789 Encounter for other orthopedic aftercare: Secondary | ICD-10-CM | POA: Diagnosis not present

## 2022-09-28 DIAGNOSIS — S46011A Strain of muscle(s) and tendon(s) of the rotator cuff of right shoulder, initial encounter: Secondary | ICD-10-CM | POA: Diagnosis not present

## 2022-09-28 DIAGNOSIS — M25611 Stiffness of right shoulder, not elsewhere classified: Secondary | ICD-10-CM | POA: Diagnosis not present

## 2022-09-28 DIAGNOSIS — M25511 Pain in right shoulder: Secondary | ICD-10-CM | POA: Diagnosis not present

## 2022-09-30 DIAGNOSIS — M25511 Pain in right shoulder: Secondary | ICD-10-CM | POA: Diagnosis not present

## 2022-09-30 DIAGNOSIS — M25611 Stiffness of right shoulder, not elsewhere classified: Secondary | ICD-10-CM | POA: Diagnosis not present

## 2022-09-30 DIAGNOSIS — Z4789 Encounter for other orthopedic aftercare: Secondary | ICD-10-CM | POA: Diagnosis not present

## 2022-09-30 DIAGNOSIS — S46011A Strain of muscle(s) and tendon(s) of the rotator cuff of right shoulder, initial encounter: Secondary | ICD-10-CM | POA: Diagnosis not present

## 2022-10-07 DIAGNOSIS — Z4789 Encounter for other orthopedic aftercare: Secondary | ICD-10-CM | POA: Diagnosis not present

## 2022-10-07 DIAGNOSIS — M25511 Pain in right shoulder: Secondary | ICD-10-CM | POA: Diagnosis not present

## 2022-10-07 DIAGNOSIS — S46011A Strain of muscle(s) and tendon(s) of the rotator cuff of right shoulder, initial encounter: Secondary | ICD-10-CM | POA: Diagnosis not present

## 2022-10-07 DIAGNOSIS — M25611 Stiffness of right shoulder, not elsewhere classified: Secondary | ICD-10-CM | POA: Diagnosis not present

## 2022-10-09 DIAGNOSIS — S46011A Strain of muscle(s) and tendon(s) of the rotator cuff of right shoulder, initial encounter: Secondary | ICD-10-CM | POA: Diagnosis not present

## 2022-10-09 DIAGNOSIS — M25511 Pain in right shoulder: Secondary | ICD-10-CM | POA: Diagnosis not present

## 2022-10-09 DIAGNOSIS — M25611 Stiffness of right shoulder, not elsewhere classified: Secondary | ICD-10-CM | POA: Diagnosis not present

## 2022-10-09 DIAGNOSIS — Z4789 Encounter for other orthopedic aftercare: Secondary | ICD-10-CM | POA: Diagnosis not present

## 2022-10-13 DIAGNOSIS — S46011A Strain of muscle(s) and tendon(s) of the rotator cuff of right shoulder, initial encounter: Secondary | ICD-10-CM | POA: Diagnosis not present

## 2022-10-13 DIAGNOSIS — M25611 Stiffness of right shoulder, not elsewhere classified: Secondary | ICD-10-CM | POA: Diagnosis not present

## 2022-10-13 DIAGNOSIS — Z4789 Encounter for other orthopedic aftercare: Secondary | ICD-10-CM | POA: Diagnosis not present

## 2022-10-13 DIAGNOSIS — M25511 Pain in right shoulder: Secondary | ICD-10-CM | POA: Diagnosis not present

## 2022-10-16 ENCOUNTER — Telehealth: Payer: Self-pay | Admitting: Family Medicine

## 2022-10-16 DIAGNOSIS — M25511 Pain in right shoulder: Secondary | ICD-10-CM | POA: Diagnosis not present

## 2022-10-16 DIAGNOSIS — Z4789 Encounter for other orthopedic aftercare: Secondary | ICD-10-CM | POA: Diagnosis not present

## 2022-10-16 DIAGNOSIS — M25611 Stiffness of right shoulder, not elsewhere classified: Secondary | ICD-10-CM | POA: Diagnosis not present

## 2022-10-16 DIAGNOSIS — S46011A Strain of muscle(s) and tendon(s) of the rotator cuff of right shoulder, initial encounter: Secondary | ICD-10-CM | POA: Diagnosis not present

## 2022-10-16 NOTE — Telephone Encounter (Signed)
Copied from CRM [id number]. Topic: Medicare AWV >> Oct 16, 2022 11:07 AM Payton Doughty wrote: Reason for CRM: Called patient to schedule Medicare Annual Wellness Visit (AWV). Left message for patient to call back and schedule Medicare Annual Wellness Visit (AWV).  Last date of AWV: 11/04/21  Please schedule an appointment at any time with Donne Anon, CMA  .  If any questions, please contact me.  Thank you ,  Verlee Rossetti; Care Guide Ambulatory Clinical Support [place] [facility] Direct Dial: [phone]

## 2022-10-19 ENCOUNTER — Telehealth: Payer: Self-pay | Admitting: Family Medicine

## 2022-10-19 DIAGNOSIS — S46011A Strain of muscle(s) and tendon(s) of the rotator cuff of right shoulder, initial encounter: Secondary | ICD-10-CM | POA: Diagnosis not present

## 2022-10-19 DIAGNOSIS — M25611 Stiffness of right shoulder, not elsewhere classified: Secondary | ICD-10-CM | POA: Diagnosis not present

## 2022-10-19 DIAGNOSIS — Z4789 Encounter for other orthopedic aftercare: Secondary | ICD-10-CM | POA: Diagnosis not present

## 2022-10-19 DIAGNOSIS — M25511 Pain in right shoulder: Secondary | ICD-10-CM | POA: Diagnosis not present

## 2022-10-19 NOTE — Telephone Encounter (Signed)
Contacted Tonya [name] to schedule their annual wellness visit. Appointment made for 11/19/2022.  LM 10/16/22 TO SCHED AWV   Tonya [name]; Care Guide Ambulatory Clinical Support [place] [facility] Direct Dial: [phone]

## 2022-10-23 DIAGNOSIS — M25611 Stiffness of right shoulder, not elsewhere classified: Secondary | ICD-10-CM | POA: Diagnosis not present

## 2022-10-23 DIAGNOSIS — M25511 Pain in right shoulder: Secondary | ICD-10-CM | POA: Diagnosis not present

## 2022-10-23 DIAGNOSIS — Z4789 Encounter for other orthopedic aftercare: Secondary | ICD-10-CM | POA: Diagnosis not present

## 2022-10-23 DIAGNOSIS — S46011A Strain of muscle(s) and tendon(s) of the rotator cuff of right shoulder, initial encounter: Secondary | ICD-10-CM | POA: Diagnosis not present

## 2022-10-26 DIAGNOSIS — S46011A Strain of muscle(s) and tendon(s) of the rotator cuff of right shoulder, initial encounter: Secondary | ICD-10-CM | POA: Diagnosis not present

## 2022-10-26 DIAGNOSIS — M25511 Pain in right shoulder: Secondary | ICD-10-CM | POA: Diagnosis not present

## 2022-10-26 DIAGNOSIS — M25611 Stiffness of right shoulder, not elsewhere classified: Secondary | ICD-10-CM | POA: Diagnosis not present

## 2022-10-26 DIAGNOSIS — Z4789 Encounter for other orthopedic aftercare: Secondary | ICD-10-CM | POA: Diagnosis not present

## 2022-10-30 DIAGNOSIS — Z4789 Encounter for other orthopedic aftercare: Secondary | ICD-10-CM | POA: Diagnosis not present

## 2022-10-30 DIAGNOSIS — S46011A Strain of muscle(s) and tendon(s) of the rotator cuff of right shoulder, initial encounter: Secondary | ICD-10-CM | POA: Diagnosis not present

## 2022-10-30 DIAGNOSIS — M25511 Pain in right shoulder: Secondary | ICD-10-CM | POA: Diagnosis not present

## 2022-10-30 DIAGNOSIS — M25611 Stiffness of right shoulder, not elsewhere classified: Secondary | ICD-10-CM | POA: Diagnosis not present

## 2022-11-02 DIAGNOSIS — S46011A Strain of muscle(s) and tendon(s) of the rotator cuff of right shoulder, initial encounter: Secondary | ICD-10-CM | POA: Diagnosis not present

## 2022-11-02 DIAGNOSIS — M25511 Pain in right shoulder: Secondary | ICD-10-CM | POA: Diagnosis not present

## 2022-11-02 DIAGNOSIS — Z4789 Encounter for other orthopedic aftercare: Secondary | ICD-10-CM | POA: Diagnosis not present

## 2022-11-05 DIAGNOSIS — M25511 Pain in right shoulder: Secondary | ICD-10-CM | POA: Diagnosis not present

## 2022-11-05 DIAGNOSIS — Z4789 Encounter for other orthopedic aftercare: Secondary | ICD-10-CM | POA: Diagnosis not present

## 2022-11-05 DIAGNOSIS — S46011A Strain of muscle(s) and tendon(s) of the rotator cuff of right shoulder, initial encounter: Secondary | ICD-10-CM | POA: Diagnosis not present

## 2022-11-09 DIAGNOSIS — S46011A Strain of muscle(s) and tendon(s) of the rotator cuff of right shoulder, initial encounter: Secondary | ICD-10-CM | POA: Diagnosis not present

## 2022-11-09 DIAGNOSIS — M25511 Pain in right shoulder: Secondary | ICD-10-CM | POA: Diagnosis not present

## 2022-11-09 DIAGNOSIS — Z4789 Encounter for other orthopedic aftercare: Secondary | ICD-10-CM | POA: Diagnosis not present

## 2022-11-09 DIAGNOSIS — M25611 Stiffness of right shoulder, not elsewhere classified: Secondary | ICD-10-CM | POA: Diagnosis not present

## 2022-11-11 DIAGNOSIS — M25511 Pain in right shoulder: Secondary | ICD-10-CM | POA: Diagnosis not present

## 2022-11-11 DIAGNOSIS — S46011A Strain of muscle(s) and tendon(s) of the rotator cuff of right shoulder, initial encounter: Secondary | ICD-10-CM | POA: Diagnosis not present

## 2022-11-11 DIAGNOSIS — M25611 Stiffness of right shoulder, not elsewhere classified: Secondary | ICD-10-CM | POA: Diagnosis not present

## 2022-11-11 DIAGNOSIS — Z4789 Encounter for other orthopedic aftercare: Secondary | ICD-10-CM | POA: Diagnosis not present

## 2022-11-11 DIAGNOSIS — Z09 Encounter for follow-up examination after completed treatment for conditions other than malignant neoplasm: Secondary | ICD-10-CM | POA: Diagnosis not present

## 2022-11-18 DIAGNOSIS — Z4789 Encounter for other orthopedic aftercare: Secondary | ICD-10-CM | POA: Diagnosis not present

## 2022-11-18 DIAGNOSIS — M25611 Stiffness of right shoulder, not elsewhere classified: Secondary | ICD-10-CM | POA: Diagnosis not present

## 2022-11-18 DIAGNOSIS — S46011A Strain of muscle(s) and tendon(s) of the rotator cuff of right shoulder, initial encounter: Secondary | ICD-10-CM | POA: Diagnosis not present

## 2022-11-18 DIAGNOSIS — M25511 Pain in right shoulder: Secondary | ICD-10-CM | POA: Diagnosis not present

## 2022-11-19 ENCOUNTER — Ambulatory Visit (INDEPENDENT_AMBULATORY_CARE_PROVIDER_SITE_OTHER): Payer: PPO | Admitting: *Deleted

## 2022-11-19 DIAGNOSIS — Z0000 Encounter for general adult medical examination without abnormal findings: Secondary | ICD-10-CM | POA: Diagnosis not present

## 2022-11-19 NOTE — Patient Instructions (Signed)
Tonya [name] , Thank you for taking time to come for your Medicare Wellness Visit. I appreciate your ongoing commitment to your health goals. Please review the following plan we discussed and let me know if I can assist you in the future.   These are the goals we discussed:  Goals   None     This is a list of the screening recommended for you and due dates:  Health Maintenance  Topic Date Due   Zoster (Shingles) Vaccine (1 of 2) 02/24/2023*   Mammogram  01/11/2023   Flu Shot  01/21/2023   Medicare Annual Wellness Visit  11/19/2023   DTaP/Tdap/Td vaccine (4 - Td or Tdap) 10/21/2026   Colon Cancer Screening  03/23/2027   Pneumonia Vaccine  Completed   DEXA scan (bone density measurement)  Completed   Hepatitis C Screening  Completed   HPV Vaccine  Aged Out   COVID-19 Vaccine  Discontinued  *Topic was postponed. The date shown is not the original due date.     Next appointment: Follow up in one year for your annual wellness visit.   Preventive Care 71 Years and Older, Female Preventive care refers to lifestyle choices and visits with your health care provider that can promote health and wellness. What does preventive care include? A yearly physical exam. This is also called an annual well check. Dental exams once or twice a year. Routine eye exams. Ask your health care provider how often you should have your eyes checked. Personal lifestyle choices, including: Daily care of your teeth and gums. Regular physical activity. Eating a healthy diet. Avoiding tobacco and drug use. Limiting alcohol use. Practicing safe sex. Taking low-dose aspirin every day. Taking vitamin and mineral supplements as recommended by your health care provider. What happens during an annual well check? The services and screenings done by your health care provider during your annual well check will depend on your age, overall health, lifestyle risk factors, and family history of disease. Counseling   Your health care provider may ask you questions about your: Alcohol use. Tobacco use. Drug use. Emotional well-being. Home and relationship well-being. Sexual activity. Eating habits. History of falls. Memory and ability to understand (cognition). Work and work Astronomer. Reproductive health. Screening  You may have the following tests or measurements: Height, weight, and BMI. Blood pressure. Lipid and cholesterol levels. These may be checked every 5 years, or more frequently if you are over 67 years old. Skin check. Lung cancer screening. You may have this screening every year starting at age 75 if you have a 30-pack-year history of smoking and currently smoke or have quit within the past 15 years. Fecal occult blood test (FOBT) of the stool. You may have this test every year starting at age 27. Flexible sigmoidoscopy or colonoscopy. You may have a sigmoidoscopy every 5 years or a colonoscopy every 10 years starting at age 73. Hepatitis C blood test. Hepatitis B blood test. Sexually transmitted disease (STD) testing. Diabetes screening. This is done by checking your blood sugar (glucose) after you have not eaten for a while (fasting). You may have this done every 1-3 years. Bone density scan. This is done to screen for osteoporosis. You may have this done starting at age 30. Mammogram. This may be done every 1-2 years. Talk to your health care provider about how often you should have regular mammograms. Talk with your health care provider about your test results, treatment options, and if necessary, the need for more tests. Vaccines  Your health care provider may recommend certain vaccines, such as: Influenza vaccine. This is recommended every year. Tetanus, diphtheria, and acellular pertussis (Tdap, Td) vaccine. You may need a Td booster every 10 years. Zoster vaccine. You may need this after age 56. Pneumococcal 13-valent conjugate (PCV13) vaccine. One dose is recommended  after age 27. Pneumococcal polysaccharide (PPSV23) vaccine. One dose is recommended after age 50. Talk to your health care provider about which screenings and vaccines you need and how often you need them. This information is not intended to replace advice given to you by your health care provider. Make sure you discuss any questions you have with your health care provider. Document Released: 07/05/2015 Document Revised: 02/26/2016 Document Reviewed: 04/09/2015 Elsevier Interactive Patient Education  2017 [employer].  Fall Prevention in the Home Falls can cause injuries. They can happen to people of all ages. There are many things you can do to make your home safe and to help prevent falls. What can I do on the outside of my home? Regularly fix the edges of walkways and driveways and fix any cracks. Remove anything that might make you trip as you walk through a door, such as a raised step or threshold. Trim any bushes or trees on the path to your home. Use bright outdoor lighting. Clear any walking paths of anything that might make someone trip, such as rocks or tools. Regularly check to see if handrails are loose or broken. Make sure that both sides of any steps have handrails. Any raised decks and porches should have guardrails on the edges. Have any leaves, snow, or ice cleared regularly. Use sand or salt on walking paths during winter. Clean up any spills in your garage right away. This includes oil or grease spills. What can I do in the bathroom? Use night lights. Install grab bars by the toilet and in the tub and shower. Do not use towel bars as grab bars. Use non-skid mats or decals in the tub or shower. If you need to sit down in the shower, use a plastic, non-slip stool. Keep the floor dry. Clean up any water that spills on the floor as soon as it happens. Remove soap buildup in the tub or shower regularly. Attach bath mats securely with double-sided non-slip rug tape. Do not  have throw rugs and other things on the floor that can make you trip. What can I do in the bedroom? Use night lights. Make sure that you have a light by your bed that is easy to reach. Do not use any sheets or blankets that are too big for your bed. They should not hang down onto the floor. Have a firm chair that has side arms. You can use this for support while you get dressed. Do not have throw rugs and other things on the floor that can make you trip. What can I do in the kitchen? Clean up any spills right away. Avoid walking on wet floors. Keep items that you use a lot in easy-to-reach places. If you need to reach something above you, use a strong step stool that has a grab bar. Keep electrical cords out of the way. Do not use floor polish or wax that makes floors slippery. If you must use wax, use non-skid floor wax. Do not have throw rugs and other things on the floor that can make you trip. What can I do with my stairs? Do not leave any items on the stairs. Make sure that there are  handrails on both sides of the stairs and use them. Fix handrails that are broken or loose. Make sure that handrails are as long as the stairways. Check any carpeting to make sure that it is firmly attached to the stairs. Fix any carpet that is loose or worn. Avoid having throw rugs at the top or bottom of the stairs. If you do have throw rugs, attach them to the floor with carpet tape. Make sure that you have a light switch at the top of the stairs and the bottom of the stairs. If you do not have them, ask someone to add them for you. What else can I do to help prevent falls? Wear shoes that: Do not have high heels. Have rubber bottoms. Are comfortable and fit you well. Are closed at the toe. Do not wear sandals. If you use a stepladder: Make sure that it is fully opened. Do not climb a closed stepladder. Make sure that both sides of the stepladder are locked into place. Ask someone to hold it for  you, if possible. Clearly mark and make sure that you can see: Any grab bars or handrails. First and last steps. Where the edge of each step is. Use tools that help you move around (mobility aids) if they are needed. These include: Canes. Walkers. Scooters. Crutches. Turn on the lights when you go into a dark area. Replace any light bulbs as soon as they burn out. Set up your furniture so you have a clear path. Avoid moving your furniture around. If any of your floors are uneven, fix them. If there are any pets around you, be aware of where they are. Review your medicines with your doctor. Some medicines can make you feel dizzy. This can increase your chance of falling. Ask your doctor what other things that you can do to help prevent falls. This information is not intended to replace advice given to you by your health care provider. Make sure you discuss any questions you have with your health care provider. Document Released: 04/04/2009 Document Revised: 11/14/2015 Document Reviewed: 07/13/2014 Elsevier Interactive Patient Education  2017 [employer].

## 2022-11-19 NOTE — Progress Notes (Signed)
Subjective:  Pt completed ADLs, Fall risk, and SDOH during e-check in on 11/18/22.  Answers verified with pt.    Tonya [name] is a 72 y.o. female who presents for Medicare Annual (Subsequent) preventive examination.  I connected with  Tonya [name] on 11/19/22 by a audio enabled telemedicine application and verified that I am speaking with the correct person using two identifiers.  Patient Location: Home  Provider Location: Office/Clinic  I discussed the limitations of evaluation and management by telemedicine. The patient expressed understanding and agreed to proceed.   Review of Systems     Cardiac Risk Factors include: advanced age (>37men, >106 women);dyslipidemia;hypertension     Objective:    There were no vitals filed for this visit. There is no height or weight on file to calculate BMI.     11/19/2022   10:52 AM 04/28/2022    8:04 PM 11/04/2021    1:46 PM 10/28/2021    2:19 PM 02/21/2020    9:31 AM  Advanced Directives  Does Patient Have a Medical Advance Directive? Yes Yes Yes Yes Yes  Type of Estate agent of [place];Living will Healthcare Power of [place];Living will Healthcare Power of [place];Out of facility DNR (pink MOST or yellow form);Living will Healthcare Power of [place];Living will   Does patient want to make changes to medical advance directive?   No - Patient declined  No - Patient declined  Copy of Healthcare Power of Attorney in Chart? No - copy requested  Yes - validated most recent copy scanned in chart (See row information)      Current Medications (verified) Outpatient Encounter Medications as of 11/19/2022  Medication Sig   aspirin EC (ASPIRIN LOW DOSE) 81 MG tablet TAKE 1 TABLET (81 MG TOTAL) BY MOUTH DAILY. SWALLOW WHOLE.   atenolol (TENORMIN) 50 MG tablet TAKE 1 TABLET BY MOUTH EVERY DAY   calcium-vitamin D (OSCAL WITH D) 250-125 MG-UNIT tablet Take 1 tablet by mouth daily.   losartan (COZAAR) 50 MG tablet TAKE 1  TABLET BY MOUTH EVERY DAY   lovastatin (MEVACOR) 10 MG tablet TAKE 1 TABLET BY MOUTH EVERYDAY AT BEDTIME   montelukast (SINGULAIR) 10 MG tablet TAKE 1 TABLET BY MOUTH EVERYDAY AT BEDTIME   pantoprazole (PROTONIX) 20 MG tablet TAKE 1 TABLET BY MOUTH EVERY DAY   No facility-administered encounter medications on file as of 11/19/2022.    Allergies (verified) Crestor [rosuvastatin] and Lipitor [atorvastatin]   History: Past Medical History:  Diagnosis Date   Allergy June 2012   Seasonal   Asthma 2015   Cancer [facility]) 2010   Left Breast   Coronary atherosclerosis due to calcified coronary lesion    GERD (gastroesophageal reflux disease)    Many years ago   History of breast cancer    Hyperlipidemia    Hypertension    Osteopenia    Past Surgical History:  Procedure Laterality Date   ABDOMINAL HYSTERECTOMY  2015   AUGMENTATION MAMMAPLASTY Left    BREAST BIOPSY Right 02/2019   BREAST IMPLANT EXCHANGE     COSMETIC SURGERY  2010   Breast cancer related   FOOT SURGERY Left 05/25/2020   MASTECTOMY Left    REDUCTION MAMMAPLASTY Right    TONSILLECTOMY AND ADENOIDECTOMY  1958   Family History  Problem Relation Age of Onset   Heart attack Mother    Arthritis Mother    Early death Mother    Heart attack Father        died from  Early death Father    Lung disease Brother    Early death Brother    Cancer Brother    Breast cancer Maternal Aunt    Breast cancer Paternal Aunt    Cancer Brother    Obesity Brother    Social History   Socioeconomic History   Marital status: Married    Spouse name: Not on file   Number of children: 1   Years of education: Not on file   Highest education level: Not on file  Occupational History   Not on file  Tobacco Use   Smoking status: Never   Smokeless tobacco: Never  Vaping Use   Vaping Use: Never used  Substance and Sexual Activity   Alcohol use: Yes    Comment: rarely   Drug use: Never   Sexual activity: Yes    Birth  control/protection: None  Other Topics Concern   Not on file  Social History Narrative   Not on file   Social Determinants of Health   Financial Resource Strain: Low Risk  (11/18/2022)   Overall Financial Resource Strain (CARDIA)    Difficulty of Paying Living Expenses: Not hard at all  Food Insecurity: No Food Insecurity (11/18/2022)   Hunger Vital Sign    Worried About Running Out of Food in the Last Year: Never true    Ran Out of Food in the Last Year: Never true  Transportation Needs: No Transportation Needs (11/18/2022)   PRAPARE - Administrator, Civil Service (Medical): No    Lack of Transportation (Non-Medical): No  Physical Activity: Insufficiently Active (11/18/2022)   Exercise Vital Sign    Days of Exercise per Week: 3 days    Minutes of Exercise per Session: 20 min  Stress: No Stress Concern Present (11/18/2022)   [employer] of Occupational Health - Occupational Stress Questionnaire    Feeling of Stress : Only a little  Social Connections: Unknown (11/18/2022)   Social Connection and Isolation Panel [NHANES]    Frequency of Communication with Friends and Family: Three times a week    Frequency of Social Gatherings with Friends and Family: Once a week    Attends Religious Services: Not on Marketing executive or Organizations: No    Attends Banker Meetings: Never    Marital Status: Married    Tobacco Counseling Counseling given: Not Answered   Clinical Intake:  Pre-visit preparation completed: Yes  Pain : No/denies pain  Nutritional Risks: None Diabetes: No  How often do you need to have someone help you when you read instructions, pamphlets, or other written materials from your doctor or pharmacy?: 1 - Never   Activities of Daily Living    11/18/2022    6:31 PM  In your present state of health, do you have any difficulty performing the following activities:  Hearing? 0  Vision? 0  Difficulty concentrating or  making decisions? 0  Walking or climbing stairs? 0  Dressing or bathing? 0  Doing errands, shopping? 0  Preparing Food and eating ? N  Using the Toilet? N  In the past six months, have you accidently leaked urine? N  Do you have problems with loss of bowel control? N  Managing your Medications? N  Managing your Finances? N  Housekeeping or managing your Housekeeping? N    Patient Care Team: Sharlene Dory, DO as PCP - General (Family Medicine)  Indicate any recent Medical Services you may have received  from other than [facility] providers in the past year (date may be approximate).     Assessment:   This is a routine wellness examination for [place].  Hearing/Vision screen No results found.  Dietary issues and exercise activities discussed: Current Exercise Habits: Home exercise routine, Type of exercise: stretching, Time (Minutes): 20, Frequency (Times/Week): 3, Weekly Exercise (Minutes/Week): 60, Intensity: Mild, Exercise limited by: orthopedic condition(s) (in PT for recent rotataor cuff repair)   Goals Addressed   None    Depression Screen    11/19/2022   10:55 AM 08/24/2022    8:47 AM 01/27/2022    9:04 AM 11/04/2021    1:47 PM 12/25/2020   10:32 AM 09/20/2020   10:39 AM  PHQ 2/9 Scores  PHQ - 2 Score 0 0 0 0 1 0  PHQ- 9 Score  1 1       Fall Risk    11/18/2022    6:31 PM 08/24/2022    8:45 AM 01/27/2022    9:04 AM 11/04/2021    1:47 PM 12/25/2020   10:32 AM  Fall Risk   Falls in the past year? 0 0 0 0 0  Number falls in past yr: 0 0 0 0 0  Injury with Fall? 0 0 0 0 0  Risk for fall due to : No Fall Risks No Fall Risks No Fall Risks No Fall Risks   Follow up Falls evaluation completed Falls evaluation completed Falls evaluation completed Falls evaluation completed Falls evaluation completed    FALL RISK PREVENTION PERTAINING TO THE HOME:  Any stairs in or around the home? Yes  If so, are there any without handrails? No  Home free of loose throw rugs in walkways,  pet beds, electrical cords, etc? Yes  Adequate lighting in your home to reduce risk of falls? Yes   ASSISTIVE DEVICES UTILIZED TO PREVENT FALLS:  Life alert? No  Use of a cane, walker or w/c? No  Grab bars in the bathroom? No  Shower chair or bench in shower? No  Elevated toilet seat or a handicapped toilet? Yes   TIMED UP AND GO:  Was the test performed?  No, audio visit .    Cognitive Function:        11/19/2022   11:03 AM 11/04/2021    2:06 PM  6CIT Screen  What Year? 0 points 0 points  What month? 0 points 0 points  What time? 0 points 0 points  Count back from 20 0 points 0 points  Months in reverse 0 points 0 points  Repeat phrase 0 points 0 points  Total Score 0 points 0 points    Immunizations Immunization History  Administered Date(s) Administered   Fluad Quad(high Dose 65+) 04/13/2019   Influenza,inj,Quad PF,6+ Mos 04/14/2018   PFIZER(Purple Top)SARS-COV-2 Vaccination 07/20/2019, 08/15/2019, 03/26/2020   Pneumococcal Conjugate-13 04/14/2018   Pneumococcal Polysaccharide-23 09/19/2019   Tdap 10/24/2008, 10/24/2008, 10/20/2016    TDAP status: Up to date  Flu Vaccine status: Up to date  Pneumococcal vaccine status: Up to date  Covid-19 vaccine status: Declined, Education has been provided regarding the importance of this vaccine but patient still declined. Advised may receive this vaccine at local pharmacy or Health Dept.or vaccine clinic. Aware to provide a copy of the vaccination record if obtained from local pharmacy or Health Dept. Verbalized acceptance and understanding.  Qualifies for Shingles Vaccine? Yes   Zostavax completed No   Shingrix Completed?: No.    Education has been provided regarding the  importance of this vaccine. Patient has been advised to call insurance company to determine out of pocket expense if they have not yet received this vaccine. Advised may also receive vaccine at local pharmacy or Health Dept. Verbalized acceptance and  understanding.  Screening Tests Health Maintenance  Topic Date Due   Medicare Annual Wellness (AWV)  11/05/2022   Zoster Vaccines- Shingrix (1 of 2) 02/24/2023 (Originally 01/08/1970)   MAMMOGRAM  01/11/2023   INFLUENZA VACCINE  01/21/2023   DTaP/Tdap/Td (4 - Td or Tdap) 10/21/2026   Colonoscopy  03/23/2027   Pneumonia Vaccine 55+ Years old  Completed   DEXA SCAN  Completed   Hepatitis C Screening  Completed   HPV VACCINES  Aged Out   COVID-19 Vaccine  Discontinued    Health Maintenance  Health Maintenance Due  Topic Date Due   Medicare Annual Wellness (AWV)  11/05/2022    Colorectal cancer screening: Type of screening: Colonoscopy. Completed 03/22/17. Repeat every 10 years  Mammogram status: Completed 12/21/20. Repeat every year. Scheduled for 12/31/22  Bone Density status: Completed 04/22/18. Results reflect: Bone density results: OSTEOPENIA. Repeat every 2 years.  Lung Cancer Screening: (Low Dose CT Chest recommended if Age 29-80 years, 30 pack-year currently smoking OR have quit w/in 15years.) does not qualify.   Additional Screening:  Hepatitis C Screening: does qualify; Completed 01/19/19  Vision Screening: Recommended annual ophthalmology exams for early detection of glaucoma and other disorders of the eye. Is the patient up to date with their annual eye exam?  Yes  Who is the provider or what is the name of the office in which the patient attends annual eye exams? [facility]. If pt is not established with a provider, would they like to be referred to a provider to establish care? No .   Dental Screening: Recommended annual dental exams for proper oral hygiene  Community Resource Referral / Chronic Care Management: CRR required this visit?  No   CCM required this visit?  No      Plan:     I have personally reviewed and noted the following in the patient's chart:   Medical and social history Use of alcohol, tobacco or illicit drugs  Current medications  and supplements including opioid prescriptions. Patient is not currently taking opioid prescriptions. Functional ability and status Nutritional status Physical activity Advanced directives List of other physicians Hospitalizations, surgeries, and ER visits in previous 12 months Vitals Screenings to include cognitive, depression, and falls Referrals and appointments  In addition, I have reviewed and discussed with patient certain preventive protocols, quality metrics, and best practice recommendations. A written personalized care plan for preventive services as well as general preventive health recommendations were provided to patient.   Due to this being a telephonic visit, the after visit summary with patients personalized plan was offered to patient via mail or my-chart. Patient would like to access on my-chart.  Donne Anon, [place]   11/19/2022   Nurse Notes: None

## 2022-11-20 DIAGNOSIS — Z4789 Encounter for other orthopedic aftercare: Secondary | ICD-10-CM | POA: Diagnosis not present

## 2022-11-20 DIAGNOSIS — S46011A Strain of muscle(s) and tendon(s) of the rotator cuff of right shoulder, initial encounter: Secondary | ICD-10-CM | POA: Diagnosis not present

## 2022-11-20 DIAGNOSIS — M25511 Pain in right shoulder: Secondary | ICD-10-CM | POA: Diagnosis not present

## 2022-11-20 DIAGNOSIS — M25611 Stiffness of right shoulder, not elsewhere classified: Secondary | ICD-10-CM | POA: Diagnosis not present

## 2022-11-23 DIAGNOSIS — L821 Other seborrheic keratosis: Secondary | ICD-10-CM | POA: Diagnosis not present

## 2022-11-23 DIAGNOSIS — M25511 Pain in right shoulder: Secondary | ICD-10-CM | POA: Diagnosis not present

## 2022-11-23 DIAGNOSIS — M25611 Stiffness of right shoulder, not elsewhere classified: Secondary | ICD-10-CM | POA: Diagnosis not present

## 2022-11-23 DIAGNOSIS — Z85828 Personal history of other malignant neoplasm of skin: Secondary | ICD-10-CM | POA: Diagnosis not present

## 2022-11-23 DIAGNOSIS — Z4789 Encounter for other orthopedic aftercare: Secondary | ICD-10-CM | POA: Diagnosis not present

## 2022-11-23 DIAGNOSIS — S46011A Strain of muscle(s) and tendon(s) of the rotator cuff of right shoulder, initial encounter: Secondary | ICD-10-CM | POA: Diagnosis not present

## 2022-11-23 DIAGNOSIS — Z129 Encounter for screening for malignant neoplasm, site unspecified: Secondary | ICD-10-CM | POA: Diagnosis not present

## 2022-11-23 DIAGNOSIS — L820 Inflamed seborrheic keratosis: Secondary | ICD-10-CM | POA: Diagnosis not present

## 2022-11-24 ENCOUNTER — Ambulatory Visit (INDEPENDENT_AMBULATORY_CARE_PROVIDER_SITE_OTHER): Payer: PPO | Admitting: Family Medicine

## 2022-11-24 ENCOUNTER — Encounter: Payer: Self-pay | Admitting: Family Medicine

## 2022-11-24 VITALS — BP 128/64 | HR 87 | Temp 97.9°F | Ht 67.0 in | Wt 210.0 lb

## 2022-11-24 DIAGNOSIS — R413 Other amnesia: Secondary | ICD-10-CM

## 2022-11-24 DIAGNOSIS — R0789 Other chest pain: Secondary | ICD-10-CM

## 2022-11-24 DIAGNOSIS — M25552 Pain in left hip: Secondary | ICD-10-CM

## 2022-11-24 NOTE — Patient Instructions (Addendum)
Ice/cold pack over area for 10-15 min twice daily.  OK to take Tylenol 1000 mg (2 extra strength tabs) or 975 mg (3 regular strength tabs) every 6 hours as needed.  Heat (pad or rice pillow in microwave) over affected area, 10-15 minutes twice daily.   If you do not hear anything about your referral in the next 1-2 weeks, call our office and ask for an update.  Try some yoga that addresses your core and hips. Physical therapy is the next step.   Foods that may reduce pain: 1) Ginger 2) Blueberries 3) Salmon 4) Pumpkin seeds 5) dark chocolate 6) turmeric 7) tart cherries 8) virgin olive oil 9) chilli peppers 10) mint 11) krill oil  Let us know if you need anything.

## 2022-11-24 NOTE — Progress Notes (Signed)
Musculoskeletal Exam  Patient: Tonya [name] ZOXWRUE DOB: November 26, 1950  DOS: 11/24/2022  SUBJECTIVE:  Chief Complaint:   Chief Complaint  Patient presents with   Flank Pain    Left side     Tonya [name] is a 72 y.o.  female for evaluation and treatment of L side pain.   Onset:  1 week ago. No inj or change in activity.  Location: L side/flank region Character:  sharp  Progression of issue:  is unchanged Associated symptoms: hurts when she rotates No bruising, redness, swelling, fevers, urinary complaints, rashes Treatment: to date has been: Tylenol.   Neurovascular symptoms: no  Patient has been dealing with memory issues for couple years.  She feels that is getting worse.  She has difficulty with word finding and collecting her thoughts.  She feels that the people and her spouse need to finish her sentences/thoughts very often.  Stress levels are relatively high.  She had an unremarkable workup a couple years ago.  She was referred to neurology team but never went to them.  She is interested in seeing them now though.  She has been dealing with left hip pain for several months as well.  She is recovering from a right rotator cuff injury and wonders if this is contributing to how she is walking.  No specific injury or change in activity otherwise.  No bruising, redness, or swelling.  She has not tried anything at home so far.  She does not exercise routinely.  Past Medical History:  Diagnosis Date   Allergy June 2012   Seasonal   Asthma 2015   Cancer [facility]) 2010   Left Breast   Coronary atherosclerosis due to calcified coronary lesion    GERD (gastroesophageal reflux disease)    Many years ago   History of breast cancer    Hyperlipidemia    Hypertension    Osteopenia     Objective: VITAL SIGNS: BP 128/64 (BP Location: Right Arm, Patient Position: Sitting, Cuff Size: Normal)   Pulse 87   Temp 97.9 F (36.6 C) (Oral)   Ht 5\' 7"  (1.702 m)   Wt 210 lb (95.3 kg)   SpO2 96%    BMI 32.89 kg/m  Constitutional: Well formed, well developed. No acute distress. Heart: RRR Thorax & Lungs: CTAB. No accessory muscle use Musculoskeletal: Left side Tenderness to palpation: Yes over the lateral lower left rib cage Deformity: no Ecchymosis: no Abdomen: Bowel sounds present, soft, nontender, non-distended Neurologic: Normal sensory function.  Gait is normal Psychiatric: Normal mood. Age appropriate judgment and insight. Alert & oriented x 3.    Assessment:  Left-sided chest wall pain  Memory deficit - Plan: Ambulatory referral to Neurology  Left hip pain  Plan: Stretch, yoga, heat, ice, Tylenol.  PT if no better. Discussed this issue extensively.  Will refer back to neurology.  Will hold off on repeating the workup done showing negative imaging and negative lab work. Recommended yoga for this as well.  Inactivity is likely contribute this and she has tight hip flexors. F/u as originally scheduled. The patient voiced understanding and agreement to the plan.  I spent 30 min w the pt discussing the above plans in addition to reviewing her chart on the same day of the visit.    Jilda Roche [place], DO 11/24/22  3:30 PM

## 2022-11-26 DIAGNOSIS — Z4789 Encounter for other orthopedic aftercare: Secondary | ICD-10-CM | POA: Diagnosis not present

## 2022-11-26 DIAGNOSIS — M25611 Stiffness of right shoulder, not elsewhere classified: Secondary | ICD-10-CM | POA: Diagnosis not present

## 2022-11-26 DIAGNOSIS — S46011A Strain of muscle(s) and tendon(s) of the rotator cuff of right shoulder, initial encounter: Secondary | ICD-10-CM | POA: Diagnosis not present

## 2022-11-26 DIAGNOSIS — M25511 Pain in right shoulder: Secondary | ICD-10-CM | POA: Diagnosis not present

## 2022-11-30 DIAGNOSIS — S46011A Strain of muscle(s) and tendon(s) of the rotator cuff of right shoulder, initial encounter: Secondary | ICD-10-CM | POA: Diagnosis not present

## 2022-11-30 DIAGNOSIS — M25611 Stiffness of right shoulder, not elsewhere classified: Secondary | ICD-10-CM | POA: Diagnosis not present

## 2022-11-30 DIAGNOSIS — M25511 Pain in right shoulder: Secondary | ICD-10-CM | POA: Diagnosis not present

## 2022-11-30 DIAGNOSIS — Z4789 Encounter for other orthopedic aftercare: Secondary | ICD-10-CM | POA: Diagnosis not present

## 2022-12-03 DIAGNOSIS — S46011A Strain of muscle(s) and tendon(s) of the rotator cuff of right shoulder, initial encounter: Secondary | ICD-10-CM | POA: Diagnosis not present

## 2022-12-03 DIAGNOSIS — M25611 Stiffness of right shoulder, not elsewhere classified: Secondary | ICD-10-CM | POA: Diagnosis not present

## 2022-12-03 DIAGNOSIS — M25511 Pain in right shoulder: Secondary | ICD-10-CM | POA: Diagnosis not present

## 2022-12-03 DIAGNOSIS — Z4789 Encounter for other orthopedic aftercare: Secondary | ICD-10-CM | POA: Diagnosis not present

## 2022-12-07 DIAGNOSIS — M25611 Stiffness of right shoulder, not elsewhere classified: Secondary | ICD-10-CM | POA: Diagnosis not present

## 2022-12-07 DIAGNOSIS — Z4789 Encounter for other orthopedic aftercare: Secondary | ICD-10-CM | POA: Diagnosis not present

## 2022-12-07 DIAGNOSIS — M25511 Pain in right shoulder: Secondary | ICD-10-CM | POA: Diagnosis not present

## 2022-12-07 DIAGNOSIS — S46011A Strain of muscle(s) and tendon(s) of the rotator cuff of right shoulder, initial encounter: Secondary | ICD-10-CM | POA: Diagnosis not present

## 2022-12-10 DIAGNOSIS — M25611 Stiffness of right shoulder, not elsewhere classified: Secondary | ICD-10-CM | POA: Diagnosis not present

## 2022-12-10 DIAGNOSIS — S46011A Strain of muscle(s) and tendon(s) of the rotator cuff of right shoulder, initial encounter: Secondary | ICD-10-CM | POA: Diagnosis not present

## 2022-12-10 DIAGNOSIS — Z4789 Encounter for other orthopedic aftercare: Secondary | ICD-10-CM | POA: Diagnosis not present

## 2022-12-10 DIAGNOSIS — M25511 Pain in right shoulder: Secondary | ICD-10-CM | POA: Diagnosis not present

## 2022-12-14 DIAGNOSIS — S46011A Strain of muscle(s) and tendon(s) of the rotator cuff of right shoulder, initial encounter: Secondary | ICD-10-CM | POA: Diagnosis not present

## 2022-12-14 DIAGNOSIS — M25511 Pain in right shoulder: Secondary | ICD-10-CM | POA: Diagnosis not present

## 2022-12-14 DIAGNOSIS — M25611 Stiffness of right shoulder, not elsewhere classified: Secondary | ICD-10-CM | POA: Diagnosis not present

## 2022-12-14 DIAGNOSIS — Z4789 Encounter for other orthopedic aftercare: Secondary | ICD-10-CM | POA: Diagnosis not present

## 2022-12-17 DIAGNOSIS — M25511 Pain in right shoulder: Secondary | ICD-10-CM | POA: Diagnosis not present

## 2022-12-17 DIAGNOSIS — S46011A Strain of muscle(s) and tendon(s) of the rotator cuff of right shoulder, initial encounter: Secondary | ICD-10-CM | POA: Diagnosis not present

## 2022-12-17 DIAGNOSIS — Z4789 Encounter for other orthopedic aftercare: Secondary | ICD-10-CM | POA: Diagnosis not present

## 2022-12-17 DIAGNOSIS — M25611 Stiffness of right shoulder, not elsewhere classified: Secondary | ICD-10-CM | POA: Diagnosis not present

## 2022-12-23 DIAGNOSIS — Z4789 Encounter for other orthopedic aftercare: Secondary | ICD-10-CM | POA: Diagnosis not present

## 2022-12-23 DIAGNOSIS — M25511 Pain in right shoulder: Secondary | ICD-10-CM | POA: Diagnosis not present

## 2022-12-23 DIAGNOSIS — M25611 Stiffness of right shoulder, not elsewhere classified: Secondary | ICD-10-CM | POA: Diagnosis not present

## 2022-12-23 DIAGNOSIS — S46011A Strain of muscle(s) and tendon(s) of the rotator cuff of right shoulder, initial encounter: Secondary | ICD-10-CM | POA: Diagnosis not present

## 2022-12-28 DIAGNOSIS — S46011A Strain of muscle(s) and tendon(s) of the rotator cuff of right shoulder, initial encounter: Secondary | ICD-10-CM | POA: Diagnosis not present

## 2022-12-28 DIAGNOSIS — Z4789 Encounter for other orthopedic aftercare: Secondary | ICD-10-CM | POA: Diagnosis not present

## 2022-12-28 DIAGNOSIS — M25611 Stiffness of right shoulder, not elsewhere classified: Secondary | ICD-10-CM | POA: Diagnosis not present

## 2022-12-28 DIAGNOSIS — M25511 Pain in right shoulder: Secondary | ICD-10-CM | POA: Diagnosis not present

## 2022-12-31 ENCOUNTER — Ambulatory Visit
Admission: RE | Admit: 2022-12-31 | Discharge: 2022-12-31 | Disposition: A | Discharge status: Home / Self Care | Payer: PPO | Source: Ambulatory Visit | Attending: Family Medicine | Admitting: Family Medicine

## 2022-12-31 DIAGNOSIS — Z1231 Encounter for screening mammogram for malignant neoplasm of breast: Secondary | ICD-10-CM

## 2023-01-01 DIAGNOSIS — M25511 Pain in right shoulder: Secondary | ICD-10-CM | POA: Diagnosis not present

## 2023-01-01 DIAGNOSIS — S46011A Strain of muscle(s) and tendon(s) of the rotator cuff of right shoulder, initial encounter: Secondary | ICD-10-CM | POA: Diagnosis not present

## 2023-01-01 DIAGNOSIS — M25611 Stiffness of right shoulder, not elsewhere classified: Secondary | ICD-10-CM | POA: Diagnosis not present

## 2023-01-01 DIAGNOSIS — Z4789 Encounter for other orthopedic aftercare: Secondary | ICD-10-CM | POA: Diagnosis not present

## 2023-01-04 DIAGNOSIS — Z4789 Encounter for other orthopedic aftercare: Secondary | ICD-10-CM | POA: Diagnosis not present

## 2023-01-04 DIAGNOSIS — M25611 Stiffness of right shoulder, not elsewhere classified: Secondary | ICD-10-CM | POA: Diagnosis not present

## 2023-01-04 DIAGNOSIS — S46011A Strain of muscle(s) and tendon(s) of the rotator cuff of right shoulder, initial encounter: Secondary | ICD-10-CM | POA: Diagnosis not present

## 2023-01-04 DIAGNOSIS — M25511 Pain in right shoulder: Secondary | ICD-10-CM | POA: Diagnosis not present

## 2023-01-07 DIAGNOSIS — S46011A Strain of muscle(s) and tendon(s) of the rotator cuff of right shoulder, initial encounter: Secondary | ICD-10-CM | POA: Diagnosis not present

## 2023-01-07 DIAGNOSIS — Z4789 Encounter for other orthopedic aftercare: Secondary | ICD-10-CM | POA: Diagnosis not present

## 2023-01-07 DIAGNOSIS — M25511 Pain in right shoulder: Secondary | ICD-10-CM | POA: Diagnosis not present

## 2023-01-07 DIAGNOSIS — M25611 Stiffness of right shoulder, not elsewhere classified: Secondary | ICD-10-CM | POA: Diagnosis not present

## 2023-01-11 DIAGNOSIS — S46011A Strain of muscle(s) and tendon(s) of the rotator cuff of right shoulder, initial encounter: Secondary | ICD-10-CM | POA: Diagnosis not present

## 2023-01-11 DIAGNOSIS — M25511 Pain in right shoulder: Secondary | ICD-10-CM | POA: Diagnosis not present

## 2023-01-11 DIAGNOSIS — M25611 Stiffness of right shoulder, not elsewhere classified: Secondary | ICD-10-CM | POA: Diagnosis not present

## 2023-01-11 DIAGNOSIS — Z4789 Encounter for other orthopedic aftercare: Secondary | ICD-10-CM | POA: Diagnosis not present

## 2023-01-14 DIAGNOSIS — M25611 Stiffness of right shoulder, not elsewhere classified: Secondary | ICD-10-CM | POA: Diagnosis not present

## 2023-01-14 DIAGNOSIS — M25511 Pain in right shoulder: Secondary | ICD-10-CM | POA: Diagnosis not present

## 2023-01-14 DIAGNOSIS — S46011A Strain of muscle(s) and tendon(s) of the rotator cuff of right shoulder, initial encounter: Secondary | ICD-10-CM | POA: Diagnosis not present

## 2023-01-14 DIAGNOSIS — Z4789 Encounter for other orthopedic aftercare: Secondary | ICD-10-CM | POA: Diagnosis not present

## 2023-01-18 DIAGNOSIS — M25611 Stiffness of right shoulder, not elsewhere classified: Secondary | ICD-10-CM | POA: Diagnosis not present

## 2023-01-18 DIAGNOSIS — M25511 Pain in right shoulder: Secondary | ICD-10-CM | POA: Diagnosis not present

## 2023-01-18 DIAGNOSIS — Z4789 Encounter for other orthopedic aftercare: Secondary | ICD-10-CM | POA: Diagnosis not present

## 2023-01-18 DIAGNOSIS — S46011A Strain of muscle(s) and tendon(s) of the rotator cuff of right shoulder, initial encounter: Secondary | ICD-10-CM | POA: Diagnosis not present

## 2023-01-21 DIAGNOSIS — Z4789 Encounter for other orthopedic aftercare: Secondary | ICD-10-CM | POA: Diagnosis not present

## 2023-01-21 DIAGNOSIS — M25511 Pain in right shoulder: Secondary | ICD-10-CM | POA: Diagnosis not present

## 2023-01-21 DIAGNOSIS — M25611 Stiffness of right shoulder, not elsewhere classified: Secondary | ICD-10-CM | POA: Diagnosis not present

## 2023-01-21 DIAGNOSIS — S46011A Strain of muscle(s) and tendon(s) of the rotator cuff of right shoulder, initial encounter: Secondary | ICD-10-CM | POA: Diagnosis not present

## 2023-01-27 ENCOUNTER — Other Ambulatory Visit: Payer: Self-pay | Admitting: Family Medicine

## 2023-01-28 DIAGNOSIS — M25611 Stiffness of right shoulder, not elsewhere classified: Secondary | ICD-10-CM | POA: Diagnosis not present

## 2023-01-28 DIAGNOSIS — Z4789 Encounter for other orthopedic aftercare: Secondary | ICD-10-CM | POA: Diagnosis not present

## 2023-01-28 DIAGNOSIS — S46011A Strain of muscle(s) and tendon(s) of the rotator cuff of right shoulder, initial encounter: Secondary | ICD-10-CM | POA: Diagnosis not present

## 2023-01-28 DIAGNOSIS — M25511 Pain in right shoulder: Secondary | ICD-10-CM | POA: Diagnosis not present

## 2023-02-01 DIAGNOSIS — M25511 Pain in right shoulder: Secondary | ICD-10-CM | POA: Diagnosis not present

## 2023-02-01 DIAGNOSIS — M25611 Stiffness of right shoulder, not elsewhere classified: Secondary | ICD-10-CM | POA: Diagnosis not present

## 2023-02-01 DIAGNOSIS — Z4789 Encounter for other orthopedic aftercare: Secondary | ICD-10-CM | POA: Diagnosis not present

## 2023-02-01 DIAGNOSIS — S46011A Strain of muscle(s) and tendon(s) of the rotator cuff of right shoulder, initial encounter: Secondary | ICD-10-CM | POA: Diagnosis not present

## 2023-02-02 ENCOUNTER — Telehealth: Payer: Self-pay | Admitting: Family Medicine

## 2023-02-02 NOTE — Telephone Encounter (Signed)
Burning senation from left ankle halfway up calf Sensation feels warm but not to touch. Symptoms started one week ago. Scheduled with Esperanza Richters 02/03/23

## 2023-02-02 NOTE — Telephone Encounter (Signed)
Pt called & stated she is having a burning sensation in her left leg. She preferred to be seen quicker however, Dr. Carmelia Roller doesn't have an opening until the 22nd. She requested to speak with Zella Ball to get advice on if she could wait or is this an urgent matter. Please call & advise pt.

## 2023-02-03 ENCOUNTER — Ambulatory Visit (HOSPITAL_BASED_OUTPATIENT_CLINIC_OR_DEPARTMENT_OTHER)
Admission: RE | Admit: 2023-02-03 | Discharge: 2023-02-03 | Disposition: A | Discharge status: Home / Self Care | Payer: PPO | Source: Ambulatory Visit | Attending: Medical | Admitting: Medical

## 2023-02-03 ENCOUNTER — Encounter: Payer: Self-pay | Admitting: Medical

## 2023-02-03 ENCOUNTER — Ambulatory Visit (INDEPENDENT_AMBULATORY_CARE_PROVIDER_SITE_OTHER): Payer: PPO | Admitting: Medical

## 2023-02-03 VITALS — BP 123/64 | HR 76 | Resp 18 | Ht 67.0 in | Wt 208.4 lb

## 2023-02-03 DIAGNOSIS — E785 Hyperlipidemia, unspecified: Secondary | ICD-10-CM

## 2023-02-03 DIAGNOSIS — R7989 Other specified abnormal findings of blood chemistry: Secondary | ICD-10-CM

## 2023-02-03 DIAGNOSIS — M25572 Pain in left ankle and joints of left foot: Secondary | ICD-10-CM

## 2023-02-03 DIAGNOSIS — R252 Cramp and spasm: Secondary | ICD-10-CM

## 2023-02-03 DIAGNOSIS — M7732 Calcaneal spur, left foot: Secondary | ICD-10-CM | POA: Diagnosis not present

## 2023-02-03 DIAGNOSIS — M79652 Pain in left thigh: Secondary | ICD-10-CM

## 2023-02-03 DIAGNOSIS — M791 Myalgia, unspecified site: Secondary | ICD-10-CM

## 2023-02-03 DIAGNOSIS — T148XXA Other injury of unspecified body region, initial encounter: Secondary | ICD-10-CM | POA: Diagnosis not present

## 2023-02-03 DIAGNOSIS — M19072 Primary osteoarthritis, left ankle and foot: Secondary | ICD-10-CM | POA: Diagnosis not present

## 2023-02-03 DIAGNOSIS — G629 Polyneuropathy, unspecified: Secondary | ICD-10-CM

## 2023-02-03 NOTE — Progress Notes (Signed)
Subjective:    Patient ID: Tonya [name], female    DOB: 1950-08-22, 72 y.o.   MRN: [id number]  HPI Discussed the use of AI scribe software for clinical note transcription with the patient, who gave verbal consent to proceed.  History of Present Illness   The patient presents with intermittent burning sensation in the left lateral ankle, which is not associated with any back pain but sensation of heat at times. This sensation is not constant and lasts for a short duration each time it occurs. The patient also reports a sore spot on the upper leg, which is sensitive to touch but not associated with any back pain.  The patient has noticed the development of non-itchy, red spots on the legs, which have been present for several years. These spots are sometimes tender, particularly when pressure is applied during activities such as a pedicure. The patient also reports a change in the color of the ankles, which has slight bruised appearance.  The patient underwent surgery two years ago for the removal of a large neuroma in the foot, following which the top of the foot has remained purple.  The patient has been on statin therapy for cholesterol management for a couple of years. The current medication, Lovastatin, has been agreeable until recently when the patient started experiencing leg cramps at night. These cramps, which wake the patient up, are located in the same muscle as the cramps experienced with previous statin medications. However, the current cramps are less painful. The patient has been on three or four different statins in the past due to severe leg cramps.  The patient is scheduled to see a cardiologist in the near future.       Past Medical History:  Diagnosis Date   Allergy June 2012   Seasonal   Asthma 2015   Cancer [facility]) 2010   Left Breast   Coronary atherosclerosis due to calcified coronary lesion    GERD (gastroesophageal reflux disease)    Many years ago   History of  breast cancer    Hyperlipidemia    Hypertension    Osteopenia      Social History   Socioeconomic History   Marital status: Married    Spouse name: Not on file   Number of children: 1   Years of education: Not on file   Highest education level: Bachelor's degree (e.g., BA, AB, BS)  Occupational History   Not on file  Tobacco Use   Smoking status: Never   Smokeless tobacco: Never  Vaping Use   Vaping status: Never Used  Substance and Sexual Activity   Alcohol use: Yes    Comment: rarely   Drug use: Never   Sexual activity: Yes    Birth control/protection: None  Other Topics Concern   Not on file  Social History Narrative   Not on file   Social Determinants of Health   Financial Resource Strain: Low Risk  (11/23/2022)   Overall Financial Resource Strain (CARDIA)    Difficulty of Paying Living Expenses: Not hard at all  Food Insecurity: No Food Insecurity (11/23/2022)   Hunger Vital Sign    Worried About Running Out of Food in the Last Year: Never true    Ran Out of Food in the Last Year: Never true  Transportation Needs: No Transportation Needs (11/23/2022)   PRAPARE - Administrator, Civil Service (Medical): No    Lack of Transportation (Non-Medical): No  Physical Activity: Insufficiently Active (11/23/2022)  Exercise Vital Sign    Days of Exercise per Week: 3 days    Minutes of Exercise per Session: 20 min  Stress: No Stress Concern Present (11/23/2022)   [employer] of Occupational Health - Occupational Stress Questionnaire    Feeling of Stress : Not at all  Social Connections: Moderately Isolated (11/23/2022)   Social Connection and Isolation Panel [NHANES]    Frequency of Communication with Friends and Family: More than three times a week    Frequency of Social Gatherings with Friends and Family: Once a week    Attends Religious Services: Never    Database administrator or Organizations: No    Attends Banker Meetings: Never    Marital  Status: Married  Catering manager Violence: Not At Risk (11/19/2022)   Humiliation, Afraid, Rape, and Kick questionnaire    Fear of Current or Ex-Partner: No    Emotionally Abused: No    Physically Abused: No    Sexually Abused: No    Past Surgical History:  Procedure Laterality Date   ABDOMINAL HYSTERECTOMY  2015   AUGMENTATION MAMMAPLASTY Left    BREAST BIOPSY Right 02/2019   BREAST IMPLANT EXCHANGE     COSMETIC SURGERY  2010   Breast cancer related   FOOT SURGERY Left 05/25/2020   MASTECTOMY Left    REDUCTION MAMMAPLASTY Right    TONSILLECTOMY AND ADENOIDECTOMY  1958    Family History  Problem Relation Age of Onset   Heart attack Mother    Arthritis Mother    Early death Mother    Heart attack Father        died from   Early death Father    Lung disease Brother    Early death Brother    Cancer Brother    Breast cancer Maternal Aunt    Breast cancer Paternal Aunt    Cancer Brother    Obesity Brother     Allergies  Allergen Reactions   Crestor [Rosuvastatin]     Myalgias    Lipitor [Atorvastatin]     myalgias    Current Outpatient Medications on File Prior to Visit  Medication Sig Dispense Refill   aspirin EC (ASPIRIN LOW DOSE) 81 MG tablet TAKE 1 TABLET (81 MG TOTAL) BY MOUTH DAILY. SWALLOW WHOLE. 90 tablet 3   atenolol (TENORMIN) 50 MG tablet TAKE 1 TABLET BY MOUTH EVERY DAY 90 tablet 3   calcium-vitamin D (OSCAL WITH D) 250-125 MG-UNIT tablet Take 1 tablet by mouth daily.     losartan (COZAAR) 50 MG tablet TAKE 1 TABLET BY MOUTH EVERY DAY 90 tablet 1   lovastatin (MEVACOR) 10 MG tablet TAKE 1 TABLET BY MOUTH EVERYDAY AT BEDTIME 75 tablet 0   montelukast (SINGULAIR) 10 MG tablet TAKE 1 TABLET BY MOUTH EVERYDAY AT BEDTIME 90 tablet 3   pantoprazole (PROTONIX) 20 MG tablet TAKE 1 TABLET BY MOUTH EVERY DAY 90 tablet 2   No current facility-administered medications on file prior to visit.    BP (!) 116/51 (BP Location: Left Arm, Patient Position: Sitting,  Cuff Size: Large)   Pulse 76   Resp 18   Ht 5\' 7"  (1.702 m)   Wt 208 lb 6.4 oz (94.5 kg)   SpO2 97%   BMI 32.64 kg/m     Review of Systems See hpi.    Objective:   Physical Exam  General Mental Status- Alert. General Appearance- Not in acute distress.   Skin 3 small redish bruised area medail left  calf area.  Neck Carotid Arteries- Normal color. Moisture- Normal Moisture. No carotid bruits. No JVD.  Chest and Lung Exam Auscultation: Breath Sounds:-Normal.  Cardiovascular Auscultation:Rythm- Regular. Murmurs & Other Heart Sounds:Auscultation of the heart reveals- No Murmurs.  Abdomen Inspection:-Inspeection Normal. Palpation/Percussion:Note:No mass. Palpation and Percussion of the abdomen reveal- Non Tender, Non Distended + BS, no rebound or guarding.    Neurologic Cranial Nerve exam:- CN III-XII intact(No nystagmus), symmetric smile. Strength:- 5/5 equal and symmetric strength both upper and lower extremities.     Left ankle- no redness or warmth. Very tender to mild palpatin lateral aspect  Calfs- symmetric bilaterally. No warmth. No edema. Negative homans signs. Left thigh- lateral aspect. Mild tender to palpatin distal lateral thigh.    Assessment & Plan:   Patient Instructions  Lower extremity discomfort(include myalgias, bruising, neuropathy and muscle cramps) Intermittent burning sensation in left ankle and lateral calf cramping at night. No associated back pain. History of neuroma removal from the same foot. -Order labs including magnesium, metabolic panel, B12, B1, CBC, sed rate, and CK to evaluate for potential causes.  Hyperlipidemia On Lovastatin with recent onset of leg cramps, similar to previous experience with other statins. -Consider holding Lovastatin pending lab results and upcoming cardiology appointment. But hesitant to advise that just yet  Lower extremity skin changes Chronic presence of non-itchy, bruise-like spots on lower  extremities, with associated tenderness on firm touch. -Continue monitoring, as previously advised by other physicians. -check cbc  If signs/symptoms change of worsen let us know.  Follow-up -after lab results advise on follow up with our office Cardiology appointment scheduled for February 26, 2023.   Esperanza Richters, PA-C   Time spent with patient today was  41 minutes which consisted of chart review, discussing diagnosis, work up treatment and documentation.

## 2023-02-03 NOTE — Patient Instructions (Addendum)
Lower extremity discomfort(include myalgias, bruising, neuropathy and muscle cramps) Intermittent burning sensation in left ankle and lateral calf cramping at night. No associated back pain. History of neuroma removal from the same foot. -Order labs including magnesium, metabolic panel, B12, B1, CBC, sed rate, and CK to evaluate for potential causes.  Hyperlipidemia On Lovastatin with recent onset of leg cramps, similar to previous experience with other statins. -Consider holding Lovastatin pending lab results and upcoming cardiology appointment. But hesitant to advise that just yet  Lower extremity skin changes Chronic presence of non-itchy, bruise-like spots on lower extremities, with associated tenderness on firm touch. -Continue monitoring, as previously advised by other physicians. -check cbc  If signs/symptoms change of worsen let us know.  Follow-up -after lab results advise on follow up with our office Cardiology appointment scheduled for February 26, 2023.

## 2023-02-04 ENCOUNTER — Encounter (INDEPENDENT_AMBULATORY_CARE_PROVIDER_SITE_OTHER): Payer: Self-pay

## 2023-02-04 LAB — COMPREHENSIVE METABOLIC PANEL
ALT: 15 U/L (ref 0–35)
AST: 21 U/L (ref 0–37)
Albumin: 4.6 g/dL (ref 3.5–5.2)
Alkaline Phosphatase: 75 U/L (ref 39–117)
BUN: 16 mg/dL (ref 6–23)
CO2: 28 meq/L (ref 19–32)
Calcium: 9.7 mg/dL (ref 8.4–10.5)
Chloride: 102 meq/L (ref 96–112)
Creatinine, Ser: 0.83 mg/dL (ref 0.40–1.20)
GFR: 71.1 mL/min (ref >60.00)
Glucose, Bld: 102 mg/dL — ABNORMAL HIGH (ref 70–99)
Potassium: 3.9 meq/L (ref 3.5–5.1)
Sodium: 138 meq/L (ref 135–145)
Total Bilirubin: 0.7 mg/dL (ref 0.2–1.2)
Total Protein: 7.2 g/dL (ref 6.0–8.3)

## 2023-02-04 LAB — CBC WITH DIFFERENTIAL/PLATELET
Basophils Absolute: 0.1 10*3/uL (ref 0.0–0.1)
Basophils Relative: 1 % (ref 0.0–3.0)
Eosinophils Absolute: 0.1 10*3/uL (ref 0.0–0.7)
Eosinophils Relative: 1.5 % (ref 0.0–5.0)
HCT: 42.4 % (ref 36.0–46.0)
Hemoglobin: 13.7 g/dL (ref 12.0–15.0)
Lymphocytes Relative: 36.7 % (ref 12.0–46.0)
Lymphs Abs: 3.4 10*3/uL (ref 0.7–4.0)
MCHC: 32.3 g/dL (ref 30.0–36.0)
MCV: 100.5 fl — ABNORMAL HIGH (ref 78.0–100.0)
Monocytes Absolute: 0.6 10*3/uL (ref 0.1–1.0)
Monocytes Relative: 6.3 % (ref 3.0–12.0)
Neutro Abs: 5.1 10*3/uL (ref 1.4–7.7)
Neutrophils Relative %: 54.5 % (ref 43.0–77.0)
Platelets: 206 10*3/uL (ref 150.0–400.0)
RBC: 4.22 Mil/uL (ref 3.87–5.11)
RDW: 13.2 % (ref 11.5–15.5)
WBC: 9.3 10*3/uL (ref 4.0–10.5)

## 2023-02-04 LAB — MAGNESIUM: Magnesium: 1.9 mg/dL (ref 1.5–2.5)

## 2023-02-04 LAB — CK: Total CK: 62 U/L (ref 7–177)

## 2023-02-04 LAB — VITAMIN B12: Vitamin B-12: 363 pg/mL (ref 211–911)

## 2023-02-04 LAB — SEDIMENTATION RATE: Sed Rate: 13 mm/h (ref 0–30)

## 2023-02-06 ENCOUNTER — Other Ambulatory Visit: Payer: Self-pay | Admitting: Family Medicine

## 2023-02-07 LAB — VITAMIN B1: Vitamin B1 (Thiamine): 14 nmol/L (ref 8–30)

## 2023-02-12 ENCOUNTER — Ambulatory Visit: Payer: PPO | Admitting: Family Medicine

## 2023-02-24 ENCOUNTER — Ambulatory Visit (INDEPENDENT_AMBULATORY_CARE_PROVIDER_SITE_OTHER): Payer: PPO | Admitting: Family Medicine

## 2023-02-24 ENCOUNTER — Encounter: Payer: Self-pay | Admitting: Family Medicine

## 2023-02-24 VITALS — BP 120/69 | HR 72 | Temp 98.4°F | Ht 67.0 in | Wt 209.1 lb

## 2023-02-24 DIAGNOSIS — E7800 Pure hypercholesterolemia, unspecified: Secondary | ICD-10-CM

## 2023-02-24 DIAGNOSIS — Z0000 Encounter for general adult medical examination without abnormal findings: Secondary | ICD-10-CM

## 2023-02-24 DIAGNOSIS — Z0001 Encounter for general adult medical examination with abnormal findings: Secondary | ICD-10-CM

## 2023-02-24 DIAGNOSIS — M25551 Pain in right hip: Secondary | ICD-10-CM

## 2023-02-24 DIAGNOSIS — M25552 Pain in left hip: Secondary | ICD-10-CM

## 2023-02-24 DIAGNOSIS — M8580 Other specified disorders of bone density and structure, unspecified site: Secondary | ICD-10-CM | POA: Diagnosis not present

## 2023-02-24 NOTE — Patient Instructions (Addendum)
Keep the diet clean and stay active.  Please get me a copy of your advanced directive form at your convenience.   I recommend getting the flu shot in mid October. This suggestion would change if the CDC comes out with a different recommendation.   Please get your X-ray done in the basement of our [place] office located on: [address] [place], [place] [address]  You do not need an appointment for that location.   We will be in touch with your X-ray results.   Ice/cold pack over area for 10-15 min twice daily.  OK to take Tylenol 1000 mg (2 extra strength tabs) or 975 mg (3 regular strength tabs) every 6 hours as needed.  Heat (pad or rice pillow in microwave) over affected area, 10-15 minutes twice daily.   Foods that may reduce pain: 1) Ginger 2) Blueberries 3) Salmon 4) Pumpkin seeds 5) dark chocolate 6) turmeric 7) tart cherries 8) virgin olive oil 9) chilli peppers 10) mint 11) krill oil  Let us know if you need anything.  Hip Exercises It is normal to feel mild stretching, pulling, tightness, or discomfort as you do these exercises, but you should stop right away if you feel sudden pain or your pain gets worse.   STRETCHING AND RANGE OF MOTION EXERCISES These exercises warm up your muscles and joints and improve the movement and flexibility of your hip. These exercises also help to relieve pain, numbness, and tingling. Exercise A: Hamstrings, Supine  Lie on your back. Loop a belt or towel over the ball of your left / right foot. The ball of your foot is on the walking surface, right under your toes. Straighten your left / right knee and slowly pull on the belt to raise your leg. Do not let your left / right knee bend while you do this. Keep your other leg flat on the floor. Raise the left / right leg until you feel a gentle stretch behind your left / right knee or thigh. Hold this position for 30 seconds. Slowly return your leg to the starting position. Repeat2 times.  Complete this stretch 3 times per week. Exercise B: Hip Rotators  Lie on your back on a firm surface. Hold your left / right knee with your left / right hand. Hold your ankle with your other hand. Gently pull your left / right knee and rotate your lower leg toward your other shoulder. Pull until you feel a stretch in your buttocks. Keep your hips and shoulders firmly planted while you do this stretch. Hold this position for 30 seconds. Repeat 2 times. Complete this stretch 3 times per week. Exercise C: V-Sit (Hamstrings and Adductors)  Sit on the floor with your legs extended in a large "V" shape. Keep your knees straight during this exercise. Start with your head and chest upright, then bend at your waist to reach for your left foot (position A). You should feel a stretch in your right inner thigh. Hold this position for 30 seconds. Then slowly return to the upright position. Bend at your waist to reach forward (position B). You should feel a stretch behind both of your thighs and knees. Hold this position for 30 seconds. Then slowly return to the upright position. Bend at your waist to reach for your right foot (position C). You should feel a stretch in your left inner thigh. Hold this position for 30 seconds. Then slowly return to the upright position. Repeat A, B, and C 2  times each. Complete this stretch 3 times per week. Exercise D: Lunge (Hip Flexors)  Place your left / right knee on the floor and bend your other knee so that is directly over your ankle. You should be half-kneeling. Keep good posture with your head over your shoulders. Tighten your buttocks to point your tailbone downward. This helps your back to keep from arching too much. You should feel a gentle stretch in the front of your left / right thigh and hip. If you do not feel any resistance, slightly slide your other foot forward and then slowly lunge forward so your knee once again lines up over your ankle. Make sure  your tailbone continues to point downward. Hold this position for 30 seconds. Repeat 2 times. Complete this stretch 3 times per week.  STRENGTHENING EXERCISES These exercises build strength and endurance in your hip. Endurance is the ability to use your muscles for a long time, even after they get tired. Exercise E: Bridge (Hip Extensors)  Lie on your back on a firm surface with your knees bent and your feet flat on the floor. Tighten your buttocks muscles and lift your bottom off the floor until the trunk of your body is level with your thighs. Do not arch your back. You should feel the muscles working in your buttocks and the back of your thighs. If you do not feel these muscles, slide your feet 1-2 inches (2.5-5 cm) farther away from your buttocks. Hold this position for 3 seconds. Slowly lower your hips to the starting position. Repeat for a total of 10 repetitions. Let your muscles relax completely between repetitions. If this exercise is too easy, try doing it with your arms crossed over your chest. Repeat 2 times. Complete this exercise 3 times per week. Exercise F: Straight Leg Raises - Hip Abductors  Lie on your side with your left / right leg in the top position. Lie so your head, shoulder, knee, and hip line up with each other. You may bend your bottom knee to help you balance. Roll your hips slightly forward, so your hips are stacked directly over each other and your left / right knee is facing forward. Leading with your heel, lift your top leg 4-6 inches (10-15 cm). You should feel the muscles in your outer hip lifting. Do not let your foot drift forward. Do not let your knee roll toward the ceiling. Hold this position for 1 second. Slowly return to the starting position. Let your muscles relax completely between repetitions. Repeat for a total of 10 repetitions.  Repeat 2 times. Complete this exercise 3 times per week. Exercise G: Straight Leg Raises - Hip Adductors  Lie  on your side with your left / right leg in the bottom position. Lie so your head, shoulder, knee, and hip line up. You may place your upper foot in front to help you balance. Roll your hips slightly forward, so your hips are stacked directly over each other and your left / right knee is facing forward. Tense the muscles in your inner thigh and lift your bottom leg 4-6 inches (10-15 cm). Hold this position for 1 second. Slowly return to the starting position. Let your muscles relax completely between repetitions. Repeat for a total of 10 repetitions. Repeat 2 times. Complete this exercise 3 times per week. Exercise H: Straight Leg Raises - Quadriceps  Lie on your back with your left / right leg extended and your other knee bent. Tense the muscles in the  front of your left / right thigh. When you do this, you should see your kneecap slide up or see increased dimpling just above your knee. Tighten these muscles even more and raise your leg 4-6 inches (10-15 cm) off the floor. Hold this position for 3 seconds. Keep these muscles tense as you lower your leg. Relax the muscles slowly and completely between repetitions. Repeat for a total of 10 repetitions. Repeat 2 times. Complete this exercise 3 times per week. Exercise I: Hip Abductors, Standing Tie one end of a rubber exercise band or tubing to a secure surface, such as a table or pole. Loop the other end of the band or tubing around your left / right ankle. Keeping your ankle with the band or tubing directly opposite of the secured end, step away until there is tension in the tubing or band. Hold onto a chair as needed for balance. Lift your left / right leg out to your side. While you do this: Keep your back upright. Keep your shoulders over your hips. Keep your toes pointing forward. Make sure to use your hip muscles to lift your leg. Do not "throw" your leg or tip your body to lift your leg. Hold this position for 1 second. Slowly return  to the starting position. Repeat for a total of 10 repetitions. Repeat 2 times. Complete this exercise 3 times per week. Exercise J: Squats (Quadriceps) Stand in a door frame so your feet and knees are in line with the frame. You may place your hands on the frame for balance. Slowly bend your knees and lower your hips like you are going to sit in a chair. Keep your lower legs in a straight-up-and-down position. Do not let your hips go lower than your knees. Do not bend your knees lower than told by your health care provider. If your hip pain increases, do not bend as low. Hold this position for 1 second. Slowly push with your legs to return to standing. Do not use your hands to pull yourself to standing. Repeat for a total of 10 repetitions. Repeat 2 times. Complete this exercise 3 times per week. Make sure you discuss any questions you have with your health care provider. Document Released: 06/26/2005 Document Revised: 03/02/2016 Document Reviewed: 06/03/2015 Elsevier Interactive Patient Education  [employer].

## 2023-02-24 NOTE — Progress Notes (Signed)
Chief Complaint  Patient presents with   Annual Exam    Woke up with a little sore throat      Well Woman Tonya [name] is here for a complete physical.   Her last physical was >1 year ago.  Current diet: in general, a "healthy" diet. Current exercise: strength training. Weight is stable and she denies daytime fatigue. Seatbelt? Yes Advanced directive? No  Health Maintenance Colonoscopy- Yes Shingrix- No DEXA- Yes Mammogram- Yes Tetanus- Yes Pneumonia- Yes Hep C screen- Yes  6 mo of b/l hip pain, worse on R side. Painful on outer hip and also groin region. No catching/locking, back pain, bruising, redness, swelling. Will sometimes take Tylenol.no neuro s/s's. No famhx of OA that she is aware of.    Past Medical History:  Diagnosis Date   Allergy June 2012   Seasonal   Asthma 2015   Cancer [facility] 2010   Left Breast   Coronary atherosclerosis due to calcified coronary lesion    GERD (gastroesophageal reflux disease)    Many years ago   History of breast cancer    Hyperlipidemia    Hypertension    Osteopenia      Past Surgical History:  Procedure Laterality Date   ABDOMINAL HYSTERECTOMY  2015   AUGMENTATION MAMMAPLASTY Left    BREAST BIOPSY Right 02/2019   BREAST IMPLANT EXCHANGE     COSMETIC SURGERY  2010   Breast cancer related   FOOT SURGERY Left 05/25/2020   MASTECTOMY Left    REDUCTION MAMMAPLASTY Right    TONSILLECTOMY AND ADENOIDECTOMY  1958    Medications  Current Outpatient Medications on File Prior to Visit  Medication Sig Dispense Refill   aspirin EC (ASPIRIN LOW DOSE) 81 MG tablet TAKE 1 TABLET (81 MG TOTAL) BY MOUTH DAILY. SWALLOW WHOLE. 90 tablet 3   atenolol (TENORMIN) 50 MG tablet TAKE 1 TABLET BY MOUTH EVERY DAY 90 tablet 3   calcium-vitamin D (OSCAL WITH D) 250-125 MG-UNIT tablet Take 1 tablet by mouth daily.     losartan (COZAAR) 50 MG tablet TAKE 1 TABLET BY MOUTH EVERY DAY 90 tablet 1   lovastatin (MEVACOR) 10 MG tablet TAKE 1 TABLET  BY MOUTH EVERYDAY AT BEDTIME 75 tablet 0   montelukast (SINGULAIR) 10 MG tablet TAKE 1 TABLET BY MOUTH EVERYDAY AT BEDTIME 90 tablet 3   pantoprazole (PROTONIX) 20 MG tablet TAKE 1 TABLET BY MOUTH EVERY DAY 90 tablet 2    Allergies Allergies  Allergen Reactions   Crestor [Rosuvastatin]     Myalgias    Lipitor [Atorvastatin]     myalgias    Review of Systems: Constitutional:  no fevers Eye:  no recent significant change in vision Ears:  No changes in hearing Nose/Mouth/Throat:  no complaints of nasal congestion, no sore throat Cardiovascular: no chest pain Respiratory:  No shortness of breath Gastrointestinal:  No change in bowel habits GU:  Female: negative for dysuria MSK: +b/l hip pain, worse on R Integumentary: +lesion on top of L foot Neurologic:  no headaches Endocrine:  denies unexplained weight changes  Exam BP 120/69 (BP Location: Right Arm, Patient Position: Sitting, Cuff Size: Large)   Pulse 72   Temp 98.4 F (36.9 C) (Oral)   Ht 5\' 7"  (1.702 m)   Wt 209 lb 2 oz (94.9 kg)   SpO2 94%   BMI 32.75 kg/m  General:  well developed, well nourished, in no apparent distress MSK: no obvious atrophy. No ttp over hip flexors or  lumbar spine; mild ttp over greater troch's bl, worse on R; neg Stinchfield, log roll, FABER, FADDIR b/l Skin:  no significant moles, warts, or growths Head:  no masses, lesions, or tenderness Eyes:  pupils equal and round, sclera anicteric without injection Ears:  canals without lesions, TMs shiny without retraction, no obvious effusion, no erythema Nose:  nares patent, mucosa normal, and no drainage Throat/Pharynx:  lips and gingiva without lesion; tongue and uvula midline; non-inflamed pharynx; no exudates or postnasal drainage Neck: neck supple without adenopathy, thyromegaly, or masses Lungs:  clear to auscultation, breath sounds equal bilaterally, no respiratory distress Cardio:  regular rate and rhythm, no bruits or LE edema Abdomen:   abdomen soft, nontender; bowel sounds normal; no masses or organomegaly Genital: Deferred Neuro:  gait normal; deep tendon reflexes normal and symmetric Psych: well oriented with normal range of affect and appropriate judgment/insight  Assessment and Plan  Well adult exam  Pure hypercholesterolemia - Plan: CANCELED: Lipid panel  Osteopenia, unspecified location - Plan: DG Bone Density  Bilateral hip pain - Plan: DG HIP UNILAT WITH PELVIS 2-3 VIEWS RIGHT, DG HIP UNILAT WITH PELVIS 2-3 VIEWS LEFT   Well 72 y.o. female. Counseled on diet and exercise. Advanced directive form provided today.  It has been 5 yrs since her last DEXA, will ck again.  Hip pain: Tylenol, heat, ice, stretches/exercises. Ck XR to r/o OA.  Other orders as above. She has been complaining of memory issues and sees the neurology team next Monday.  She has refused sleep studies.  Interestingly, I think her neurologist also is trained in sleep medicine. Follow up in 6 mo. The patient voiced understanding and agreement to the plan.  Jilda Roche [place], DO 02/24/23 10:35 AM

## 2023-02-26 ENCOUNTER — Telehealth (HOSPITAL_BASED_OUTPATIENT_CLINIC_OR_DEPARTMENT_OTHER): Payer: Self-pay

## 2023-02-26 ENCOUNTER — Telehealth: Payer: Self-pay | Admitting: Family Medicine

## 2023-02-26 ENCOUNTER — Ambulatory Visit: Payer: PPO | Admitting: Cardiology

## 2023-02-26 ENCOUNTER — Encounter: Payer: Self-pay | Admitting: Cardiology

## 2023-02-26 VITALS — BP 120/71 | HR 68 | Resp 16 | Ht 67.0 in | Wt 207.8 lb

## 2023-02-26 DIAGNOSIS — I10 Essential (primary) hypertension: Secondary | ICD-10-CM | POA: Diagnosis not present

## 2023-02-26 DIAGNOSIS — I2510 Atherosclerotic heart disease of native coronary artery without angina pectoris: Secondary | ICD-10-CM

## 2023-02-26 DIAGNOSIS — E7800 Pure hypercholesterolemia, unspecified: Secondary | ICD-10-CM | POA: Diagnosis not present

## 2023-02-26 DIAGNOSIS — I2583 Coronary atherosclerosis due to lipid rich plaque: Secondary | ICD-10-CM | POA: Diagnosis not present

## 2023-02-26 NOTE — Progress Notes (Signed)
ID:  TIEN BOUTHILLIER, DOB 02/11/1951, MRN [id number]  PCP:  Sharlene Dory, DO  Cardiologist:  Tessa Lerner, DO, [facility] (established care Oct 20, 2021) Former Cardiology Providers: Dr. Blondell Reveal ([facility]), Dr. Epifanio Lesches (04/2021)  Date: 02/26/23 Last Office Visit: 12/17/2021  Chief Complaint  Patient presents with   Follow-up    Annual follow-up-mild nonobstructive CAD. Lipid management    HPI  Tonya [name] is a 72 y.o. Caucasian female whose past medical history and cardiovascular risk factors include: Hypertension, mild nonobstructive CAD (coronary CTA 01/2021), diastolic dysfunction, family history of heart disease, osteopenia, Hx of breast cancer, postmenopausal female, advanced age.  Patient was referred to practice for evaluation and management of coronary artery disease.  Patient did undergo coronary CTA in the past was noted to have mild nonobstructive CAD with mixed plaque in the proximal LAD and her total coronary calcium score was 9.2 placing her at the 50th percentile.  She was initially recommended to be on statin therapy.  She did not tolerate atorvastatin or rosuvastatin due to myalgias and headaches and then later was referred to lipid clinic for possible PCSK9 inhibitors.   She later transferred her care to [facility] cardiovascular and I started her on Zetia 10 mg p.o. daily and pravastatin.  Her lipids had improved.  And she now presents for follow-up.  Over the last 1 year she denies anginal chest pain or heart failure symptoms.  Her pravastatin is now changed to lovastatin.  Pravastatin was discontinued due to myalgias in the lower extremity per patient but she does not know why zeita was discontinued.   She did undergo rotator cuff repair surgery without any cardiovascular complications.  ALLERGIES: Allergies  Allergen Reactions   Crestor [Rosuvastatin]     Myalgias    Lipitor [Atorvastatin]     myalgias    MEDICATION LIST PRIOR TO  VISIT: Current Meds  Medication Sig   aspirin EC (ASPIRIN LOW DOSE) 81 MG tablet TAKE 1 TABLET (81 MG TOTAL) BY MOUTH DAILY. SWALLOW WHOLE.   atenolol (TENORMIN) 50 MG tablet TAKE 1 TABLET BY MOUTH EVERY DAY   calcium-vitamin D (OSCAL WITH D) 250-125 MG-UNIT tablet Take 1 tablet by mouth daily.   losartan (COZAAR) 50 MG tablet TAKE 1 TABLET BY MOUTH EVERY DAY   lovastatin (MEVACOR) 10 MG tablet TAKE 1 TABLET BY MOUTH EVERYDAY AT BEDTIME   montelukast (SINGULAIR) 10 MG tablet TAKE 1 TABLET BY MOUTH EVERYDAY AT BEDTIME   pantoprazole (PROTONIX) 20 MG tablet TAKE 1 TABLET BY MOUTH EVERY DAY     PAST MEDICAL HISTORY: Past Medical History:  Diagnosis Date   Allergy June 2012   Seasonal   Asthma 2015   Cancer [facility]) 2010   Left Breast   Coronary atherosclerosis due to calcified coronary lesion    GERD (gastroesophageal reflux disease)    Many years ago   History of breast cancer    Hyperlipidemia    Hypertension    Osteopenia     PAST SURGICAL HISTORY: Past Surgical History:  Procedure Laterality Date   ABDOMINAL HYSTERECTOMY  2015   AUGMENTATION MAMMAPLASTY Left    BREAST BIOPSY Right 02/2019   BREAST IMPLANT EXCHANGE     COSMETIC SURGERY  2010   Breast cancer related   FOOT SURGERY Left 05/25/2020   MASTECTOMY Left    REDUCTION MAMMAPLASTY Right    ROTATOR CUFF REPAIR Right 07/17/2022   TONSILLECTOMY AND ADENOIDECTOMY  1958    FAMILY HISTORY: The patient family  history includes Arthritis in her mother; Breast cancer in her maternal aunt and paternal aunt; Cancer in her brother and brother; Early death in her brother, father, and mother; Heart attack in her father and mother; Lung disease in her brother; Obesity in her brother.  SOCIAL HISTORY:  The patient  reports that she has never smoked. She has never used smokeless tobacco. She reports current alcohol use. She reports that she does not use drugs.  REVIEW OF SYSTEMS: Review of Systems  Cardiovascular:  Negative for  chest pain, claudication, dyspnea on exertion, irregular heartbeat, leg swelling, near-syncope, orthopnea, palpitations, paroxysmal nocturnal dyspnea and syncope.  Respiratory:  Negative for shortness of breath.   Hematologic/Lymphatic: Negative for bleeding problem.  Musculoskeletal:  Positive for joint pain. Negative for muscle cramps and myalgias.  Neurological:  Negative for dizziness and light-headedness.    PHYSICAL EXAM:    02/26/2023    9:57 AM 02/24/2023   10:01 AM 02/03/2023    2:08 PM  Vitals with BMI  Height 5\' 7"  5\' 7"    Weight 207 lbs 13 oz 209 lbs 2 oz   BMI 32.54 32.75   Systolic 120 120 563  Diastolic 71 69 64  Pulse 68 72    Physical Exam  Constitutional: No distress.  Age appropriate, hemodynamically stable.   Neck: No JVD present.  Cardiovascular: Normal rate, regular rhythm, S1 normal, S2 normal, intact distal pulses and normal pulses. Exam reveals no gallop, no S3 and no S4.  No murmur heard. Pulmonary/Chest: Effort normal and breath sounds normal. No stridor. She has no wheezes. She has no rales.  Abdominal: Soft. Bowel sounds are normal. She exhibits no distension. There is no abdominal tenderness.  Musculoskeletal:        General: No edema.     Cervical back: Neck supple.  Neurological: She is alert and oriented to person, place, and time. She has intact cranial nerves (2-12).  Skin: Skin is warm and moist.   CARDIAC DATABASE: EKG: 02/26/2023: Sinus rhythm, 66 bpm, without underlying ischemia injury pattern  Echocardiogram: 02/25/2021: LVEF 50-55%, grade 1 diastolic impairment, RV systolic function low normal, trivial MR, estimated RAP 3 mmHg.  Stress Testing: No results found for this or any previous visit from the past 1095 days.  CCTA  02/10/2021: Mild nonobstructive CAD. Coronary calcium score 9.2, 50th percentile Noncardiac findings: Steatotic liver  Heart Catheterization: None  LABORATORY DATA:    Latest Ref Rng & Units 02/03/2023    2:11  PM 05/08/2022   12:00 PM 09/20/2020   11:29 AM  CBC  WBC 4.0 - 10.5 K/uL 9.3  10.7  7.9   Hemoglobin 12.0 - 15.0 g/dL 87.5  64.3  32.9   Hematocrit 36.0 - 46.0 % 42.4  42.2  41.8   Platelets 150.0 - 400.0 K/uL 206.0  177.0  181.0        Latest Ref Rng & Units 02/03/2023    2:11 PM 06/19/2022    9:25 AM 05/08/2022   12:00 PM  CMP  Glucose 70 - 99 mg/dL 518   88   BUN 6 - 23 mg/dL 16   19   Creatinine 8.41 - 1.20 mg/dL 6.60   6.30   Sodium 160 - 145 mEq/L 138   141   Potassium 3.5 - 5.1 mEq/L 3.9   4.5   Chloride 96 - 112 mEq/L 102   102   CO2 19 - 32 mEq/L 28   33   Calcium 8.4 - 10.5  mg/dL 9.7   9.6   Total Protein 6.0 - 8.3 g/dL 7.2  6.8  7.1   Total Bilirubin 0.2 - 1.2 mg/dL 0.7  0.8  1.0   Alkaline Phos 39 - 117 U/L 75  61  64   AST 0 - 37 U/L 21  19  20    ALT 0 - 35 U/L 15  14  16      Lipid Panel  Lab Results  Component Value Date   CHOL 184 06/19/2022   HDL 75.30 06/19/2022   LDLCALC 90 06/19/2022   LDLDIRECT 74 12/03/2021   TRIG 93.0 06/19/2022   CHOLHDL 2 06/19/2022    No components found for: "NTPROBNP" No results for input(s): "PROBNP" in the last 8760 hours. No results for input(s): "TSH" in the last 8760 hours.  BMP Recent Labs    05/08/22 1200 02/03/23 1411  NA 141 138  K 4.5 3.9  CL 102 102  CO2 33* 28  GLUCOSE 88 102*  BUN 19 16  CREATININE 0.89 0.83  CALCIUM 9.6 9.7    HEMOGLOBIN A1C Lab Results  Component Value Date   HGBA1C 5.7 05/08/2022    IMPRESSION:    ICD-10-CM   1. Coronary atherosclerosis due to lipid rich plaque  I25.10 EKG 12-Lead   I25.83 Lipid Panel With LDL/HDL Ratio    LDL cholesterol, direct    ECHOCARDIOGRAM COMPLETE    2. Essential hypertension  I10     3. Pure hypercholesterolemia  E78.00        RECOMMENDATIONS: Tonya [name] is a 72 y.o. Caucasian  female whose past medical history and cardiac risk factors include: Hypertension, mild nonobstructive CAD (coronary CTA 01/2021), diastolic dysfunction,  family history of heart disease, osteopenia, Hx of breast cancer, postmenopausal female, advanced age.  Coronary atherosclerosis due to lipid rich plaque Coronary CTA August 2022: Total CAC 9.2, 50th percentile. Echocardiogram September 2022: Low normal LVEF with grade 1 diastolic dysfunction, no significant valvular heart disease, see report for additional details. EKG today illustrates sinus rhythm without underlying injury pattern. Currently on aspirin and lovastatin. Pravastatin was discontinued since last office visit due to muscle aches in the lower extremities. Not sure why Zetia was discontinued-she was able to tolerate it well. Will repeat fasting lipid profile and direct LDL. Based on the results of the lipids patient is willing to uptitrate medical therapy -ideally restart Zetia or consider Nexlizet  Essential hypertension Office and home blood pressures are very well-controlled. Medications reconciled. Reemphasized importance of low-salt diet. No longer experiences lightheaded and dizziness. She is congratulated on her weight loss journey -which has significantly helped her blood pressures as well.  Pure hypercholesterolemia Currently on lovastatin.   She denies myalgia or other side effects. Will check fasting lipid profiles as discussed above. LDL 139 mg/dL (16/1096), 045 mg/dL (February 4098), 74 mg/dL (June 1191) Intolerant to Lipitor and Crestor due to headaches and nausea. Intolerant to pravastatin due to myalgias  FINAL MEDICATION LIST END OF ENCOUNTER: No orders of the defined types were placed in this encounter.   There are no discontinued medications.    Current Outpatient Medications:    aspirin EC (ASPIRIN LOW DOSE) 81 MG tablet, TAKE 1 TABLET (81 MG TOTAL) BY MOUTH DAILY. SWALLOW WHOLE., Disp: 90 tablet, Rfl: 3   atenolol (TENORMIN) 50 MG tablet, TAKE 1 TABLET BY MOUTH EVERY DAY, Disp: 90 tablet, Rfl: 3   calcium-vitamin D (OSCAL WITH D) 250-125 MG-UNIT  tablet, Take 1 tablet by mouth daily.,  Disp: , Rfl:    losartan (COZAAR) 50 MG tablet, TAKE 1 TABLET BY MOUTH EVERY DAY, Disp: 90 tablet, Rfl: 1   lovastatin (MEVACOR) 10 MG tablet, TAKE 1 TABLET BY MOUTH EVERYDAY AT BEDTIME, Disp: 75 tablet, Rfl: 0   montelukast (SINGULAIR) 10 MG tablet, TAKE 1 TABLET BY MOUTH EVERYDAY AT BEDTIME, Disp: 90 tablet, Rfl: 3   pantoprazole (PROTONIX) 20 MG tablet, TAKE 1 TABLET BY MOUTH EVERY DAY, Disp: 90 tablet, Rfl: 2  Orders Placed This Encounter  Procedures   Lipid Panel With LDL/HDL Ratio   LDL cholesterol, direct   EKG 12-Lead   ECHOCARDIOGRAM COMPLETE    There are no Patient Instructions on file for this visit.   --Continue cardiac medications as reconciled in final medication list. --Return in about 1 year (around 02/26/2024) for Annual follow up visit mild nonobstructive CAD and secondary prevention. Or sooner if needed. --Continue follow-up with your primary care physician regarding the management of your other chronic comorbid conditions.  Patient's questions and concerns were addressed to her satisfaction. She voices understanding of the instructions provided during this encounter.   This note was created using a voice recognition software as a result there may be grammatical errors inadvertently enclosed that do not reflect the nature of this encounter. Every attempt is made to correct such errors.  Tessa Lerner, [place], [facility]  Pager:  [phone] Office: [phone]

## 2023-02-26 NOTE — Telephone Encounter (Signed)
Pt called & stated per Dr.Wendling she need to use [facility] or [facility] so she will not be charged [employer] for [employer] bone density. Please send order to [facility] so pt can schedule appt.

## 2023-03-01 ENCOUNTER — Telehealth: Payer: Self-pay | Admitting: Neurology

## 2023-03-01 ENCOUNTER — Other Ambulatory Visit: Payer: Self-pay | Admitting: Family Medicine

## 2023-03-01 ENCOUNTER — Ambulatory Visit: Payer: PPO | Admitting: Neurology

## 2023-03-01 ENCOUNTER — Encounter: Payer: Self-pay | Admitting: Neurology

## 2023-03-01 VITALS — BP 127/79 | HR 75 | Ht 67.0 in | Wt 208.0 lb

## 2023-03-01 DIAGNOSIS — I2510 Atherosclerotic heart disease of native coronary artery without angina pectoris: Secondary | ICD-10-CM

## 2023-03-01 DIAGNOSIS — G4700 Insomnia, unspecified: Secondary | ICD-10-CM

## 2023-03-01 DIAGNOSIS — E2839 Other primary ovarian failure: Secondary | ICD-10-CM

## 2023-03-01 DIAGNOSIS — R413 Other amnesia: Secondary | ICD-10-CM | POA: Insufficient documentation

## 2023-03-01 DIAGNOSIS — Z1211 Encounter for screening for malignant neoplasm of colon: Secondary | ICD-10-CM

## 2023-03-01 DIAGNOSIS — I2583 Coronary atherosclerosis due to lipid rich plaque: Secondary | ICD-10-CM

## 2023-03-01 DIAGNOSIS — Z853 Personal history of malignant neoplasm of breast: Secondary | ICD-10-CM | POA: Insufficient documentation

## 2023-03-01 NOTE — Telephone Encounter (Signed)
Referral placed and patient informed

## 2023-03-01 NOTE — Telephone Encounter (Signed)
Wants to do at the [facility] Will order She is not aware of where she got her colonoscopy. She wants to know where she had it in 2018

## 2023-03-01 NOTE — Telephone Encounter (Signed)
She needs have it ordered at [facility] imaging of the breast center?

## 2023-03-01 NOTE — Telephone Encounter (Signed)
[facility] adv NPR sent to GI [phone]

## 2023-03-01 NOTE — Telephone Encounter (Signed)
Patient informed She will need a referral for a GI doctor for her colonoscopy She does not remember where her last was one was at.

## 2023-03-01 NOTE — Telephone Encounter (Signed)
Called left message to call back 

## 2023-03-01 NOTE — Patient Instructions (Signed)
Mild Neurocognitive Disorder Mild neurocognitive disorder, formerly known as mild cognitive impairment, is a disorder in which memory does not work as well as it should. This disorder may also cause problems with other mental functions, including thought, communication, behavior, and completion of tasks. These problems can be noticed and measured, but they usually do not interfere with daily activities or the ability to live independently. Mild neurocognitive disorder typically develops after 71 years of age, but it can also develop at younger ages. It is not as serious as major neurocognitive disorder, also known as dementia, but it may be the first sign of it. Generally, symptoms of this condition get worse over time. In rare cases, symptoms can get better. What are the causes? This condition may be caused by: Brain disorders like Alzheimer's disease, Parkinson's disease, and other conditions that gradually damage nerve cells (neurodegenerative conditions). Diseases that affect blood vessels in the brain and result in small strokes. Certain infections, such as HIV. Traumatic brain injury. Other medical conditions, such as brain tumors, underactive thyroid (hypothyroidism), and vitamin B12 deficiency. Use of certain drugs or prescription medicines. What increases the risk? The following factors may make you more likely to develop this condition: Being older than 65 years. Being female. Low education level. Diabetes, high blood pressure, high cholesterol, and other conditions that increase the risk for blood vessel diseases. Untreated or undertreated sleep apnea. Having a certain type of gene that can be passed from parent to child (inherited). Chronic health problems such as heart disease, lung disease, liver disease, kidney disease, or depression. What are the signs or symptoms? Symptoms of this condition include: Difficulty remembering. You may: Forget names, phone numbers, or details of  recent events. Forget social events and appointments. Repeatedly forget where you put your car keys or other items. Difficulty thinking and solving problems. You may have trouble with complex tasks, such as: Paying bills. Driving in unfamiliar places. Difficulty communicating. You may have trouble: Finding the right word or naming an object. Forming a sentence that makes sense, or understanding what you read or hear. Changes in your behavior or personality. When this happens, you may: Lose interest in the things that you used to enjoy. Withdraw from social situations. Get angry more easily than usual. Act before thinking. How is this diagnosed? This condition is diagnosed based on: Your symptoms. Your health care provider may ask you and the people you spend time with, such as family and friends, about your symptoms. Evaluation of mental functions (neuropsychological testing). Your health care provider may refer you to a neurologist or mental health specialist to evaluate your mental functions in detail. To identify the cause of your condition, your health care provider may: Get a detailed medical history. Ask about use of alcohol, drugs, and prescription medicines. Do a physical exam. Order blood tests and brain imaging exams. How is this treated? Mild neurocognitive disorder that is caused by medicine use, drug use, infection, or another medical condition may improve when the cause is treated, or when medicines or drugs are stopped. If this disorder has another cause, it generally does not improve and may get worse. In these cases, the goal of treatment is to help you manage the loss of mental function. Treatments in these cases include: Medicine. Medicine mainly helps memory and behavior symptoms. Talk therapy. Talk therapy provides education, emotional support, memory aids, and other ways of making up for problems with mental function. Lifestyle changes, including: Getting regular  exercise. Eating a healthy diet  that includes omega-3 fatty acids. Challenging your thinking and memory skills. Having more social interaction. Follow these instructions at home: Eating and drinking  Drink enough fluid to keep your urine pale yellow. Eat a healthy diet that includes omega-3 fatty acids. These can be found in: Fish. Nuts. Leafy vegetables. Vegetable oils. If you drink alcohol: Limit how much you use to: 0-1 drink a day for women. 0-2 drinks a day for men. Be aware of how much alcohol is in your drink. In the [place] one drink equals one 12 oz bottle of beer (355 mL), one 5 oz glass of wine (148 mL), or one 1 oz glass of hard liquor (44 mL). Lifestyle  Get regular exercise as told by your health care provider. Do not use any products that contain nicotine or tobacco, such as cigarettes, e-cigarettes, and chewing tobacco. If you need help quitting, ask your health care provider. Practice ways to manage stress. If you need help managing stress, ask your health care provider. Continue to have social interaction. Keep your mind active with stimulating activities you enjoy, such as reading or playing games. Make sure to get quality sleep. Follow these tips: Avoid napping during the day. Keep your sleeping area dark and cool. Avoid exercising during the few hours before you go to bed. Avoid caffeine products in the evening. General instructions Take over-the-counter and prescription medicines only as told by your health care provider. Your health care provider may recommend that you avoid taking medicines that can affect thinking, such as pain medicines or sleep medicines. Work with your health care provider to find out what you need help with and what your safety needs are. Keep all follow-up visits. This is important. Where to find more information [employer] on Aging: https://walker.com/ Contact a health care provider if: You have any new symptoms. Get help right  away if: You develop new confusion or your confusion gets worse. You act in ways that place you or your family in danger. Summary Mild neurocognitive disorder is a disorder in which memory does not work as well as it should. Mild neurocognitive disorder can have many causes. It may be the first stage of dementia. To manage your condition, get regular exercise, keep your mind active, get quality sleep, and eat a healthy diet. This information is not intended to replace advice given to you by your health care provider. Make sure you discuss any questions you have with your health care provider. Document Revised: 10/23/2019 Document Reviewed: 10/23/2019 Elsevier Patient Education  2024 Elsevier Inc. Memory Compensation Strategies  Use "WARM" strategy.  W= write it down  A= associate it  R= repeat it  M= make a mental note  2.   You can keep a Glass blower/designer.  Use a 3-ring notebook with sections for the following: calendar, important names and phone numbers,  medications, doctors' names/phone numbers, lists/reminders, and a section to journal what you did  each day.   3.    Use a calendar to write appointments down.  4.    Write yourself a schedule for the day.  This can be placed on the calendar or in a separate section of the Memory Notebook.  Keeping a  regular schedule can help memory.  5.    Use medication organizer with sections for each day or morning/evening pills.  You may need help loading it  6.    Keep a basket, or pegboard by the door.  Place items that you need to take  out with you in the basket or on the pegboard.  You may also want to  include a message board for reminders.  7.    Use sticky notes.  Place sticky notes with reminders in a place where the task is performed.  For example: " turn off the  stove" placed by the stove, "lock the door" placed on the door at eye level, " take your medications" on  the bathroom mirror or by the place where you normally take your  medications.  8.    Use alarms/timers.  Use while cooking to remind yourself to check on food or as a reminder to take your medicine, or as a  reminder to make a call, or as a reminder to perform another task, etc. Management of Memory Problems  There are some general things you can do to help manage your memory problems.  Your memory may not in fact recover, but by using techniques and strategies you will be able to manage your memory difficulties better.  1)  Establish a routine. Try to establish and then stick to a regular routine.  By doing this, you will get used to what to expect and you will reduce the need to rely on your memory.  Also, try to do things at the same time of day, such as taking your medication or checking your calendar first thing in the morning. Think about think that you can do as a part of a regular routine and make a list.  Then enter them into a daily planner to remind you.  This will help you establish a routine.  2)  Organize your environment. Organize your environment so that it is uncluttered.  Decrease visual stimulation.  Place everyday items such as keys or cell phone in the same place every day (ie.  Basket next to front door) Use post it notes with a brief message to yourself (ie. Turn off light, lock the door) Use labels to indicate where things go (ie. Which cupboards are for food, dishes, etc.) Keep a notepad and pen by the telephone to take messages  3)  Memory Aids A diary or journal/notebook/daily planner Making a list (shopping list, chore list, to do list that needs to be done) Using an alarm as a reminder (kitchen timer or cell phone alarm) Using cell phone to store information (Notes, Calendar, Reminders) Calendar/White board placed in a prominent position Post-it notes  In order for memory aids to be useful, you need to have good habits.  It's no good remembering to make a note in your journal if you don't remember to look in it.  Try setting  aside a certain time of day to look in journal.  4)  Improving mood and managing fatigue. There may be other factors that contribute to memory difficulties.  Factors, such as anxiety, depression and tiredness can affect memory. Regular gentle exercise can help improve your mood and give you more energy. Simple relaxation techniques may help relieve symptoms of anxiety Try to get back to completing activities or hobbies you enjoyed doing in the past. Learn to pace yourself through activities to decrease fatigue. Find out about some local support groups where you can share experiences with others. Try and achieve 7-8 hours of sleep at night.

## 2023-03-01 NOTE — Progress Notes (Signed)
[facility]  Provider:  Dr Vickey Huger Referring Provider: Sharlene Dory* Primary Care Physician:  Sharlene Dory, DO  Chief Complaint  Patient presents with   New Patient (Initial Visit)    Rm 2, here with husband Tonya [name] Pt is here for memory deficit. Pt states that within the past year as the day goes on she forgets things, specially names. States she has forgotten her husbands name a few times. States when she is writing something she forgets how to spell words. Pt states if she is in  the midst of conversation and gets interrupted she looses the train of thought. Not getting lost STM.      HPI:  Tonya [name] is a 72 y.o. female and seen here upon referral from Dr. Carmelia Roller for a Consultation/ Evaluation of cognitive decline, delayed word finding, naming - even her husband's name did escape her recently.  Easily distracted and derailed thoughts when interrupted. "here with husband Tonya [name]. Pt is here for memory problem that  evolved over  the past 2 years As the day goes on she forgets things, specially names.  States she has forgotten her husbands name a few times.  States when she is writing something she forgets how to spell words.  In the middle of conversation , if she is interrupted, she will forgets completely what the conversation was about.   This patient reports onset of aphasia over a period of 2 years.   Past medical history: 6 mo of b/l hip pain, worse on R side. Painful on outer hip and also groin region. No catching/locking, back pain, bruising, redness, swelling. Will sometimes take Tylenol.no neuro s/s's. No famhx of OA that she is aware of.    Social history : Chemical engineer degree, HS graduate, professional career.  Retired at [age].  Married, adult child 67 year old daughter. Non smoker, social drinker, 1 glass once a month.      Seasonal    Asthma 2015   Cancer [facility]) 2010    Left Breast   Coronary  atherosclerosis due to calcified coronary lesion     GERD (gastroesophageal reflux disease)      Many years ago        Hyperlipidemia, unspecified      Hypertension  unspecified.     Osteopenia in 2021      There is no biological member with dementia. Paternal health history is not well known.       Review of Systems: Out of a complete 14 system review, the patient complains of only the following symptoms, and all other reviewed systems are negative.   Memory concerns.   Anxiety   Chronic pain, right shoulder -  influencing sleep-  indigestion, poor sleep quality, no snoring  no apnea.    Social History   Socioeconomic History   Marital status: Married    Spouse name: Not on file   Number of children: 1   Years of education: Not on file   Highest education level: Bachelor's degree (e.g., BA, AB, BS)  Occupational History   Not on file  Tobacco Use   Smoking status: Never   Smokeless tobacco: Never  Vaping Use   Vaping status: Never Used  Substance and Sexual Activity   Alcohol use: Yes    Comment: rarely   Drug use: Never   Sexual activity: Yes    Birth control/protection: None  Other Topics Concern   Not on file  Social History Narrative  Not on file   Social Determinants of Health   Financial Resource Strain: Low Risk  (11/23/2022)   Overall Financial Resource Strain (CARDIA)    Difficulty of Paying Living Expenses: Not hard at all  Food Insecurity: No Food Insecurity (11/23/2022)   Hunger Vital Sign    Worried About Running Out of Food in the Last Year: Never true    Ran Out of Food in the Last Year: Never true  Transportation Needs: No Transportation Needs (11/23/2022)   PRAPARE - Administrator, Civil Service (Medical): No    Lack of Transportation (Non-Medical): No  Physical Activity: Insufficiently Active (11/23/2022)   Exercise Vital Sign    Days of Exercise per Week: 3 days    Minutes of Exercise per Session: 20 min  Stress: No  Stress Concern Present (11/23/2022)   [employer] of Occupational Health - Occupational Stress Questionnaire    Feeling of Stress : Not at all  Social Connections: Moderately Isolated (11/23/2022)   Social Connection and Isolation Panel [NHANES]    Frequency of Communication with Friends and Family: More than three times a week    Frequency of Social Gatherings with Friends and Family: Once a week    Attends Religious Services: Never    Database administrator or Organizations: No    Attends Banker Meetings: Never    Marital Status: Married  Catering manager Violence: Not At Risk (11/19/2022)   Humiliation, Afraid, Rape, and Kick questionnaire    Fear of Current or Ex-Partner: No    Emotionally Abused: No    Physically Abused: No    Sexually Abused: No    Family History  Problem Relation Age of Onset   Heart attack Mother    Arthritis Mother    Early death Mother    Heart attack Father        died from   Early death Father    Lung disease Brother    Early death Brother    Cancer Brother    Breast cancer Maternal Aunt    Breast cancer Paternal Aunt    Cancer Brother    Obesity Brother     Past Medical History:  Diagnosis Date   Allergy June 2012   Seasonal   Asthma 2015   Cancer [facility] 2010   Left Breast   Coronary atherosclerosis due to calcified coronary lesion    GERD (gastroesophageal reflux disease)    Many years ago   History of breast cancer    Hyperlipidemia    Hypertension    Osteopenia     Past Surgical History:  Procedure Laterality Date   ABDOMINAL HYSTERECTOMY  2015   AUGMENTATION MAMMAPLASTY Left    BREAST BIOPSY Right 02/2019   BREAST IMPLANT EXCHANGE     COSMETIC SURGERY  2010   Breast cancer related   FOOT SURGERY Left 05/25/2020   MASTECTOMY Left    REDUCTION MAMMAPLASTY Right    ROTATOR CUFF REPAIR Right 07/17/2022   TONSILLECTOMY AND ADENOIDECTOMY  1958    Current Outpatient Medications  Medication Sig Dispense Refill    aspirin EC (ASPIRIN LOW DOSE) 81 MG tablet TAKE 1 TABLET (81 MG TOTAL) BY MOUTH DAILY. SWALLOW WHOLE. 90 tablet 3   atenolol (TENORMIN) 50 MG tablet TAKE 1 TABLET BY MOUTH EVERY DAY 90 tablet 3   calcium-vitamin D (OSCAL WITH D) 250-125 MG-UNIT tablet Take 1 tablet by mouth daily.     losartan (COZAAR) 50 MG tablet TAKE  1 TABLET BY MOUTH EVERY DAY 90 tablet 1   lovastatin (MEVACOR) 10 MG tablet TAKE 1 TABLET BY MOUTH EVERYDAY AT BEDTIME 75 tablet 0   montelukast (SINGULAIR) 10 MG tablet TAKE 1 TABLET BY MOUTH EVERYDAY AT BEDTIME 90 tablet 3   Multiple Vitamins-Minerals (MULTIVITAMIN ADULTS 50+) TABS Take by mouth.     pantoprazole (PROTONIX) 20 MG tablet TAKE 1 TABLET BY MOUTH EVERY DAY 90 tablet 2   No current facility-administered medications for this visit.    Allergies as of 03/01/2023 - Review Complete 03/01/2023  Allergen Reaction Noted   Crestor [rosuvastatin]  05/19/2021   Lipitor [atorvastatin]  05/19/2021    Vitals: BP 127/79   Pulse 75   Ht 5\' 7"  (1.702 m)   Wt 208 lb (94.3 kg)   BMI 32.58 kg/m  Last Weight:  Wt Readings from Last 1 Encounters:  03/01/23 208 lb (94.3 kg)   Last Height:   Ht Readings from Last 1 Encounters:  03/01/23 5\' 7"  (1.702 m)   Last BMI: 33 Physical exam:  General: The patient is awake, alert and appears not in acute distress.  The patient is well groomed. Head: Normocephalic, atraumatic.  Neck is supple. No Goiter   Cardiovascular:  Regular rate and palpable peripheral pulse:  Respiratory: clear Skin:  Without evidence of edema, or rash Trunk: patient has normal posture.   Neurologic exam : The patient is awake and alert, oriented to place and time.  Memory subjective  described as impaired:     03/01/2023    2:08 PM  Montreal Cognitive Assessment   Visuospatial/ Executive (0/5) 5  Naming (0/3) 3  Attention: Read list of digits (0/2) 2  Attention: Read list of letters (0/1) 1  Attention: Serial 7 subtraction starting at 100  (0/3) 1  Language: Repeat phrase (0/2) 2  Language : Fluency (0/1) 0  Abstraction (0/2) 2  Delayed Recall (0/5) 0  Orientation (0/6) 6  Total 22/ 30     There is a normal attention span & concentration ability.  Speech is fluent without dysarthria, mild dysphonia - and no aphasia in conversation.  Mood and affect are anxious.   Cranial nerves: Pupils are equal and briskly reactive to light. Funduscopic exam without  evidence of pallor or edema. Extraocular movements  in vertical and horizontal planes intact and without nystagmus. Visual fields by finger perimetry are intact. Hearing to finger rub intact.  Facial sensation intact to fine touch. Facial motor strength is symmetric and tongue and uvula move midline.  Motor exam:   Normal tone and normal muscle bulk and symmetric normal strength in all extremities. Grip Strength intact . Restricted ROM for neck , rotation and flexion and extension.  Proximal strength of shoulder muscles and hip flexors was intact .  Sensory:  Fine touch and vibration were tested. Proprioception was tested in the upper extremities only and was  normal.  Coordination: Rapid alternating movements in the fingers/hands were normal.  Finger-to-nose maneuver was tested and showed no evidence of ataxia, dysmetria or tremor.  Gait and station: Patient walked without assistive device . Core Strength within normal limits. Stance is stable and of normal base.   Deep tendon reflexes: in the  upper and lower extremities are symmetric and not brisk. Babinski maneuver response is downgoing.   Assessment: Total time for face to face interview and examination, for review of  images and laboratory testing, neurophysiology testing and pre-existing records, including out-of -network , was 60 minutes. Assessment  is as follows here:  1) the patient's Montreal cognitive assessment showed excellent executive function, naming here was intact , attention was intact - but not  recall or math- she did have trouble with the serial 7 subtraction She could repeat a rather convoluted sentence, she did not have a fluency to name 11 or more words that she got stuck at 10 which is very close.   2) attention appears intact. I like for her to see Dr. Kieth Brightly at [facility] rehab for neuropsychological testing    I reviewed her labs, mildly elevated MCV.   Plan:  Treatment plan and additional workup planned after today includes:   1) Neuropsychological testing  2) MRI brain  3) Dementia panel : ATN.  Rv in 4-5 months.  Melvyn Novas, MD  4)

## 2023-03-02 ENCOUNTER — Telehealth: Payer: Self-pay | Admitting: Neurology

## 2023-03-02 DIAGNOSIS — I2510 Atherosclerotic heart disease of native coronary artery without angina pectoris: Secondary | ICD-10-CM | POA: Diagnosis not present

## 2023-03-02 DIAGNOSIS — I2583 Coronary atherosclerosis due to lipid rich plaque: Secondary | ICD-10-CM | POA: Diagnosis not present

## 2023-03-02 NOTE — Telephone Encounter (Signed)
HST HTA pending

## 2023-03-03 LAB — LIPID PANEL WITH LDL/HDL RATIO
Cholesterol, Total: 179 mg/dL (ref 100–199)
HDL: 67 mg/dL
LDL Chol Calc (NIH): 94 mg/dL (ref 0–99)
LDL/HDL Ratio: 1.4 ratio (ref 0.0–3.2)
Triglycerides: 101 mg/dL (ref 0–149)
VLDL Cholesterol Cal: 18 mg/dL (ref 5–40)

## 2023-03-03 LAB — LDL CHOLESTEROL, DIRECT: LDL Direct: 99 mg/dL (ref 0–99)

## 2023-03-04 ENCOUNTER — Encounter: Payer: Self-pay | Admitting: Cardiology

## 2023-03-04 ENCOUNTER — Encounter: Payer: Self-pay | Admitting: Neurology

## 2023-03-04 NOTE — Telephone Encounter (Signed)
The patient left a voice mail yesterday asking about billing questions. Angie called her back and had to leave a message, but directed her to call [facility] with her questions. Her insurance doesn't require prior authorization.

## 2023-03-05 ENCOUNTER — Encounter: Payer: Self-pay | Admitting: Neurology

## 2023-03-05 NOTE — Progress Notes (Signed)
Patient states she thought you wanted her to take a statin and wants to know why Zetia

## 2023-03-06 LAB — APOE ALZHEIMER'S RISK

## 2023-03-06 LAB — ATN PROFILE
A -- Beta-amyloid 42/40 Ratio: 0.096 — ABNORMAL LOW (ref >0.102)
Beta-amyloid 40: 235.29 pg/mL
Beta-amyloid 42: 22.52 pg/mL
N -- NfL, Plasma: 4.29 pg/mL (ref 0.00–7.64)
T -- p-tau181: 2.54 pg/mL — ABNORMAL HIGH (ref 0.00–0.97)

## 2023-03-08 ENCOUNTER — Telehealth: Payer: Self-pay | Admitting: Neurology

## 2023-03-08 ENCOUNTER — Encounter: Payer: Self-pay | Admitting: Psychology

## 2023-03-08 ENCOUNTER — Other Ambulatory Visit: Payer: Self-pay

## 2023-03-08 MED ORDER — EZETIMIBE 10 MG PO TABS: 10.0000 mg | ORAL_TABLET | Freq: Every day | ORAL | 0 refills | Status: DC

## 2023-03-08 NOTE — Telephone Encounter (Signed)
Please see encounter from 03/08/23. Tonya [name] informed pt of reasults.

## 2023-03-08 NOTE — Telephone Encounter (Signed)
Pt called wanting to inform provider that back in 02/26/21 she had a CT Scan done and she would like to know if that scan and the one she is getting done can be compared. Please advise.

## 2023-03-09 ENCOUNTER — Encounter: Payer: Self-pay | Admitting: Family Medicine

## 2023-03-09 NOTE — Telephone Encounter (Signed)
Pt CT scan was normal in 2022. The MRI is more detailed and will give more information than CT scan did back in 2022.

## 2023-03-09 NOTE — Telephone Encounter (Signed)
Please advise 

## 2023-03-10 NOTE — Telephone Encounter (Signed)
HST HTA Berkley Harvey: [id number] (exp. 03/02/23 to 05/31/23)

## 2023-03-11 ENCOUNTER — Encounter: Payer: Self-pay | Admitting: Neurology

## 2023-03-11 ENCOUNTER — Ambulatory Visit
Admission: RE | Admit: 2023-03-11 | Discharge: 2023-03-11 | Disposition: A | Discharge status: Home / Self Care | Payer: PPO | Source: Ambulatory Visit | Attending: Neurology | Admitting: Neurology

## 2023-03-11 DIAGNOSIS — R413 Other amnesia: Secondary | ICD-10-CM | POA: Diagnosis not present

## 2023-03-11 DIAGNOSIS — G309 Alzheimer's disease, unspecified: Secondary | ICD-10-CM

## 2023-03-11 MED ORDER — GADOPICLENOL 0.5 MMOL/ML IV SOLN
9.0000 mL | Freq: Once | INTRAVENOUS | Status: AC | PRN
  Administered 2023-03-11: 9 mL via INTRAVENOUS

## 2023-03-11 NOTE — Progress Notes (Signed)
To improve her lipids closer to what they were in the past.   Tessa Lerner, DO, [facility]

## 2023-03-11 NOTE — Telephone Encounter (Signed)
This has been addressed patient is aware and has gotten new medication

## 2023-03-11 NOTE — Progress Notes (Signed)
This message has been addressed patient is aware of medication I put order in and sent it to patient preferred pharmacy pt voiced understanding

## 2023-03-11 NOTE — Telephone Encounter (Signed)
Please look at the result note for the recent lipid results.   Beautifull Cisar Kirwin, DO, [facility]

## 2023-03-12 ENCOUNTER — Encounter: Payer: Self-pay | Admitting: Gastroenterology

## 2023-03-15 NOTE — Addendum Note (Signed)
Addended by: Melvyn Novas on: 03/15/2023 06:44 PM   Modules accepted: Orders

## 2023-03-16 ENCOUNTER — Other Ambulatory Visit: Payer: Self-pay | Admitting: *Deleted

## 2023-03-16 DIAGNOSIS — R413 Other amnesia: Secondary | ICD-10-CM

## 2023-03-16 DIAGNOSIS — G309 Alzheimer's disease, unspecified: Secondary | ICD-10-CM

## 2023-03-16 NOTE — Telephone Encounter (Signed)
HST- HTA Tonya [name]: [id number] (exp. 03/02/23 to 05/31/23)   Patient is scheduled at [facility] for 04/06/23 at 9:30 AM  Mailed packet to the patient.

## 2023-03-18 ENCOUNTER — Ambulatory Visit (AMBULATORY_SURGERY_CENTER): Payer: PPO

## 2023-03-18 ENCOUNTER — Telehealth: Payer: Self-pay | Admitting: Neurology

## 2023-03-18 ENCOUNTER — Encounter: Payer: Self-pay | Admitting: Gastroenterology

## 2023-03-18 VITALS — Ht 67.0 in | Wt 200.0 lb

## 2023-03-18 DIAGNOSIS — Z800 Family history of malignant neoplasm of digestive organs: Secondary | ICD-10-CM

## 2023-03-18 DIAGNOSIS — Z1211 Encounter for screening for malignant neoplasm of colon: Secondary | ICD-10-CM

## 2023-03-18 MED ORDER — NA SULFATE-K SULFATE-MG SULF 17.5-3.13-1.6 GM/177ML PO SOLN
1.0000 | Freq: Once | ORAL | 0 refills | Status: AC
Start: 2023-03-18 — End: 2023-03-18

## 2023-03-18 MED ORDER — ONDANSETRON HCL 4 MG PO TABS
4.0000 mg | ORAL_TABLET | ORAL | 0 refills | Status: DC
Start: 2023-03-18 — End: 2023-04-14

## 2023-03-18 NOTE — Telephone Encounter (Signed)
Referral for neuropsychology fax to [facility] to see Dr. Eileen Stanford Renfroe. Phone: [phone], Fax: [phone].

## 2023-03-18 NOTE — Progress Notes (Signed)
No egg or soy allergy known to patient  No issues known to pt with past sedation with any surgeries or procedures Patient denies ever being told they had issues or difficulty with intubation  No FH of Malignant Hyperthermia Pt is not on diet pills Pt is not on  home 02  Pt is not on blood thinners  Pt denies issues with constipation  No A fib or A flutter Have any cardiac testing pending-- no  LOA: independent  Prep: suprep  Patient's chart reviewed by Cathlyn Parsons CNRA prior to previsit and patient appropriate for the [facility].  Previsit completed and red dot placed by patient's name on their procedure day (on provider's schedule).     PV competed with patient. Prep instructions sent via mychart and home address. Goodrx coupon for [facility] provided to use for price reduction if needed.

## 2023-03-20 ENCOUNTER — Encounter: Payer: Self-pay | Admitting: Family Medicine

## 2023-03-22 ENCOUNTER — Encounter: Payer: Self-pay | Admitting: Family Medicine

## 2023-03-22 NOTE — Telephone Encounter (Signed)
She is scheduled with Dr. Rueben Bash on 11/25 and 12/11.

## 2023-03-30 ENCOUNTER — Other Ambulatory Visit: Payer: Self-pay | Admitting: Cardiology

## 2023-03-30 ENCOUNTER — Ambulatory Visit (HOSPITAL_COMMUNITY)
Admission: RE | Admit: 2023-03-30 | Discharge: 2023-03-30 | Disposition: A | Discharge status: Home / Self Care | Payer: PPO | Source: Ambulatory Visit | Attending: Neurology | Admitting: Neurology

## 2023-03-30 DIAGNOSIS — G309 Alzheimer's disease, unspecified: Secondary | ICD-10-CM | POA: Diagnosis not present

## 2023-03-30 MED ORDER — FLORBETAPIR F 18 500-1900 MBQ/ML IV SOLN
10.5300 | Freq: Once | INTRAVENOUS | Status: AC
  Administered 2023-03-30: 10.53 via INTRAVENOUS

## 2023-04-06 ENCOUNTER — Ambulatory Visit: Payer: PPO | Admitting: Neurology

## 2023-04-06 DIAGNOSIS — Z853 Personal history of malignant neoplasm of breast: Secondary | ICD-10-CM

## 2023-04-06 DIAGNOSIS — G4710 Hypersomnia, unspecified: Secondary | ICD-10-CM | POA: Diagnosis not present

## 2023-04-06 DIAGNOSIS — G4700 Insomnia, unspecified: Secondary | ICD-10-CM

## 2023-04-06 DIAGNOSIS — R413 Other amnesia: Secondary | ICD-10-CM

## 2023-04-06 DIAGNOSIS — I2510 Atherosclerotic heart disease of native coronary artery without angina pectoris: Secondary | ICD-10-CM

## 2023-04-07 NOTE — Progress Notes (Signed)
[facility]  [name] 72 year old female [birth year]   HOME SLEEP TEST REPORT ( by Watch PAT)   STUDY DATA:  04-07-2023   ORDERING CLINICIAN: Melvyn Novas, MD  REFERRING CLINICIAN: Dr Carmelia Roller / Dr Odis Hollingshead    CLINICAL INFORMATION/HISTORY:  Tonya [name] is a 72 y.o. female and seen here upon referral from Dr. Carmelia Roller for a Consultation/ Evaluation of cognitive decline, delayed word finding, naming - even her husband's name Tawanna Cooler) did escape her recently.  Easily distracted and losing train of thoughts when interrupted. CMA: "here with husband [name]. Memory problems that evolved over the past 2 years". As the day goes on she forgets more things, especially names.  Sometimes when she is writing she forgets how to spell the words.  If she is interrupted in the middle of conversation, she will forget completely what the conversation was about.  This patient reports onset of aphasia /STM loss over a period of 2 years. Chronic pain in hips and joints. 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   BMI: 32 kg/m   Neck Circumference: 15   FINDINGS:   Sleep Summary:   Total Recording Time (hours, min): 9 hours 19 minutes       Total Sleep Time (hours, min):   8 hours 36 minutes              Percent REM (%):   23.4%                                     Respiratory Indices:   Calculated pAHI (per hour): By CMS criteria 5.4/h                           REM pAHI:      19/h                                           NREM pAHI:    1.2/h                          Supine AHI:   The patient slept almost exclusively in supine for 482 minutes with an AHI of 6.2/h following CMS criteria.  Snoring reached a mean volume of 40 dB which is just at the threshold of detection.  Snoring was only measured for 5% of total sleep time.                                                Oxygen Saturation Statistics:    O2 Saturation Range (%):  Between a nadir at 84 the maximum saturation of 98% with a mean saturation of 94%.                           O2 Saturation (minutes) <89%:    Total time in hypoxia was 3.1-minute.                  Pulse Rate Statistics:   Pulse  Mean (bpm):   73 bpm              Pulse Range:   Between 58 and 103 bpm              IMPRESSION:  This HST confirms the presence of a very mild sleep apnea that was strictly REM sleep dependent.  Interestingly this patient had proportion of REM sleep and her sleep architecture was not fragmented.  She also did not have significant hypoxia episodes neither by nadir nor by duration. There was no bradycardia tachy-cardia.   RECOMMENDATION: I would not treat such a mild sleep apnea, as I do not see an impact on her sleep architecture. This patient 's sleep quality would benefit more from effective pain control of arthritis/ joint pain.   I will follow for memory issues in the frame of the general neurology clinic.     INTERPRETING PHYSICIAN:   Melvyn Novas, MD

## 2023-04-13 ENCOUNTER — Encounter: Payer: Self-pay | Admitting: Neurology

## 2023-04-13 DIAGNOSIS — G4700 Insomnia, unspecified: Secondary | ICD-10-CM | POA: Insufficient documentation

## 2023-04-13 NOTE — Procedures (Signed)
[facility]  [name] 72 year old female [birth year]   HOME SLEEP TEST REPORT ( by Watch PAT)   STUDY DATA:  04-07-2023   ORDERING CLINICIAN: Melvyn Novas, MD  REFERRING CLINICIAN: Dr Carmelia Roller / Dr Odis Hollingshead    CLINICAL INFORMATION/HISTORY:  Tonya [name] is a 72 y.o. female and seen here upon referral from Dr. Carmelia Roller for a Consultation/ Evaluation of cognitive decline, delayed word finding, naming - even her husband's name Tawanna Cooler) did escape her recently.  Easily distracted and losing train of thoughts when interrupted. CMA: "here with husband [name]. Memory problems that evolved over the past 2 years". As the day goes on she forgets more things, especially names.  Sometimes when she is writing she forgets how to spell the words.  If she is interrupted in the middle of conversation, she will forget completely what the conversation was about.  This patient reports onset of aphasia /STM loss over a period of 2 years. Chronic pain in hips and joints. 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   BMI: 32 kg/m   Neck Circumference: 15   FINDINGS:   Sleep Summary:   Total Recording Time (hours, min): 9 hours 19 minutes       Total Sleep Time (hours, min):   8 hours 36 minutes              Percent REM (%):   23.4%                                     Respiratory Indices:   Calculated pAHI (per hour): By CMS criteria 5.4/h                           REM pAHI:      19/h                                           NREM pAHI:    1.2/h                          Supine AHI:   The patient slept almost exclusively in supine for 482 minutes with an AHI of 6.2/h following CMS criteria.  Snoring reached a mean volume of 40 dB which is just at the threshold of detection.  Snoring was only measured for 5% of total sleep time.                                                Oxygen Saturation Statistics:    O2 Saturation Range (%):  Between a nadir at 84 the maximum saturation of 98% with a mean saturation of 94%.                           O2 Saturation (minutes) <89%:    Total time in hypoxia was 3.1-minute.                  Pulse Rate Statistics:   Pulse  Mean (bpm):   73 bpm              Pulse Range:   Between 58 and 103 bpm              IMPRESSION:  This HST confirms the presence of a very mild sleep apnea that was strictly REM sleep dependent.  Interestingly this patient had proportion of REM sleep and her sleep architecture was not fragmented.  She also did not have significant hypoxia episodes neither by nadir nor by duration. There was no bradycardia tachy-cardia.   RECOMMENDATION: I would not treat such a mild sleep apnea, as I do not see an impact on her sleep architecture. This patient 's sleep quality would benefit more from effective pain control of arthritis/ joint pain.   I will follow for memory issues in the frame of the general neurology clinic.     INTERPRETING PHYSICIAN:   Melvyn Novas, MD

## 2023-04-14 ENCOUNTER — Encounter: Payer: Self-pay | Admitting: Gastroenterology

## 2023-04-14 ENCOUNTER — Ambulatory Visit: Payer: PPO | Admitting: Gastroenterology

## 2023-04-14 ENCOUNTER — Other Ambulatory Visit: Payer: Self-pay | Admitting: Family Medicine

## 2023-04-14 VITALS — BP 124/64 | HR 76 | Temp 98.4°F | Resp 14 | Ht 67.0 in | Wt 200.0 lb

## 2023-04-14 DIAGNOSIS — E785 Hyperlipidemia, unspecified: Secondary | ICD-10-CM | POA: Diagnosis not present

## 2023-04-14 DIAGNOSIS — I10 Essential (primary) hypertension: Secondary | ICD-10-CM | POA: Diagnosis not present

## 2023-04-14 DIAGNOSIS — Z1211 Encounter for screening for malignant neoplasm of colon: Secondary | ICD-10-CM

## 2023-04-14 DIAGNOSIS — D120 Benign neoplasm of cecum: Secondary | ICD-10-CM

## 2023-04-14 DIAGNOSIS — Z800 Family history of malignant neoplasm of digestive organs: Secondary | ICD-10-CM | POA: Diagnosis not present

## 2023-04-14 DIAGNOSIS — K635 Polyp of colon: Secondary | ICD-10-CM | POA: Diagnosis not present

## 2023-04-14 MED ORDER — SODIUM CHLORIDE 0.9 % IV SOLN: 500.0000 mL | Freq: Once | INTRAVENOUS | Status: DC

## 2023-04-14 NOTE — Progress Notes (Signed)
Patient stated prior to sedation that her PIV was sensitive, but it flushed well and worked. Sedation administered with some discomfort from patient, but clearly the PIV was in the vein. Warm pack applied to area.  Uneventful anesthetic. Report to pacu rn. Vss. Care resumed by rn.

## 2023-04-14 NOTE — Op Note (Signed)
[facility] Patient Name: Tonya [name] Procedure Date: 04/14/2023 8:25 AM MRN: [id number] Endoscopist: Lorin Picket E. Tomasa Rand , MD, [id number] Age: 72 Referring MD:  Date of Birth: [birth year] Gender: Female Account #: [id number] Procedure:                Colonoscopy Indications:              Screening in patient at increased risk: Colorectal                            cancer in brother 45 or older, Last colonoscopy 10                            years ago Medicines:                Monitored Anesthesia Care Procedure:                Pre-Anesthesia Assessment:                           - Prior to the procedure, a History and Physical                            was performed, and patient medications and                            allergies were reviewed. The patient's tolerance of                            previous anesthesia was also reviewed. The risks                            and benefits of the procedure and the sedation                            options and risks were discussed with the patient.                            All questions were answered, and informed consent                            was obtained. Prior Anticoagulants: The patient has                            taken no anticoagulant or antiplatelet agents. ASA                            Grade Assessment: II - A patient with mild systemic                            disease. After reviewing the risks and benefits,                            the patient was deemed in satisfactory condition to  undergo the procedure.                           After obtaining informed consent, the colonoscope                            was passed under direct vision. Throughout the                            procedure, the patient's blood pressure, pulse, and                            oxygen saturations were monitored continuously. The                            CF [id number] #[id number] was introduced  through the anus                            and advanced to the the terminal ileum, with                            identification of the appendiceal orifice and IC                            valve. The colonoscopy was somewhat difficult due                            to a tortuous colon. Successful completion of the                            procedure was aided by using manual pressure and                            withdrawing and reinserting the scope. The patient                            tolerated the procedure well. The quality of the                            bowel preparation was excellent. The terminal                            ileum, ileocecal valve, appendiceal orifice, and                            rectum were photographed. The bowel preparation                            used was SUPREP via split dose instruction. Scope In: 8:33:39 AM Scope Out: 8:56:14 AM Scope Withdrawal Time: 0 hours 12 minutes 20 seconds  Total Procedure Duration: 0 hours 22 minutes 35 seconds  Findings:                 The perianal and digital rectal examinations were  normal. Pertinent negatives include normal                            sphincter tone and no palpable rectal lesions.                           A 3 mm polyp was found in the cecum. The polyp was                            sessile. The polyp was removed with a cold snare.                            Resection and retrieval were complete. Estimated                            blood loss was minimal.                           A few small-mouthed diverticula were found in the                            sigmoid colon.                           The exam was otherwise normal throughout the                            examined colon.                           The terminal ileum appeared normal.                           The retroflexed view of the distal rectum and anal                            verge was normal and  showed no anal or rectal                            abnormalities. Complications:            No immediate complications. Estimated Blood Loss:     Estimated blood loss was minimal. Impression:               - One 3 mm polyp in the cecum, removed with a cold                            snare. Resected and retrieved.                           - Mild diverticulosis in the sigmoid colon.                           - The examined portion of the ileum was normal.                           -  The distal rectum and anal verge are normal on                            retroflexion view. Recommendation:           - Patient has a contact number available for                            emergencies. The signs and symptoms of potential                            delayed complications were discussed with the                            patient. Return to normal activities tomorrow.                            Written discharge instructions were provided to the                            patient.                           - Resume previous diet.                           - Continue present medications.                           - Await pathology results.                           - Repeat colonoscopy (date not yet determined) for                            surveillance based on pathology results. [name] E. Tomasa Rand, MD 04/14/2023 9:01:49 AM This report has been signed electronically.

## 2023-04-14 NOTE — Patient Instructions (Addendum)
Resume previous diet.                           - Continue present medications.                           - Await pathology results.                           - Repeat colonoscopy (date not yet determined) for                            surveillance based on pathology results.  Handout on polyps given.     YOU HAD AN ENDOSCOPIC PROCEDURE TODAY AT THE [facility]:   Refer to the procedure report that was given to you for any specific questions about what was found during the examination.  If the procedure report does not answer your questions, please call your gastroenterologist to clarify.  If you requested that your care partner not be given the details of your procedure findings, then the procedure report has been included in a sealed envelope for you to review at your convenience later.  YOU SHOULD EXPECT: Some feelings of bloating in the abdomen. Passage of more gas than usual.  Walking can help get rid of the air that was put into your GI tract during the procedure and reduce the bloating. If you had a lower endoscopy (such as a colonoscopy or flexible sigmoidoscopy) you may notice spotting of blood in your stool or on the toilet paper. If you underwent a bowel prep for your procedure, you may not have a normal bowel movement for a few days.  Please Note:  You might notice some irritation and congestion in your nose or some drainage.  This is from the oxygen used during your procedure.  There is no need for concern and it should clear up in a day or so.  SYMPTOMS TO REPORT IMMEDIATELY:  Following lower endoscopy (colonoscopy or flexible sigmoidoscopy):  Excessive amounts of blood in the stool  Significant tenderness or worsening of abdominal pains  Swelling of the abdomen that is new, acute  Fever of 100F or higher   For urgent or emergent issues, a gastroenterologist can be reached at any hour by calling [phone]. Do not use MyChart messaging for urgent concerns.     DIET:  We do recommend a small meal at first, but then you may proceed to your regular diet.  Drink plenty of fluids but you should avoid alcoholic beverages for 24 hours.  ACTIVITY:  You should plan to take it easy for the rest of today and you should NOT DRIVE or use heavy machinery until tomorrow (because of the sedation medicines used during the test).    FOLLOW UP: Our staff will call the number listed on your records the next business day following your procedure.  We will call around 7:15- 8:00 am to check on you and address any questions or concerns that you may have regarding the information given to you following your procedure. If we do not reach you, we will leave a message.     If any biopsies were taken you will be contacted by phone or by letter within the next 1-3 weeks.  Please call us at [phone] if you have not heard about the  biopsies in 3 weeks.    SIGNATURES/CONFIDENTIALITY: You and/or your care partner have signed paperwork which will be entered into your electronic medical record.  These signatures attest to the fact that that the information above on your After Visit Summary has been reviewed and is understood.  Full responsibility of the confidentiality of this discharge information lies with you and/or your care-partner.

## 2023-04-14 NOTE — Progress Notes (Signed)
Called to room to assist during endoscopic procedure.  Patient ID and intended procedure confirmed with present staff. Received instructions for my participation in the procedure from the performing physician.  

## 2023-04-14 NOTE — Progress Notes (Signed)
[facility] History and Physical   Primary Care Physician:  Sharlene Dory, DO   Reason for Procedure:   Colon cancer screening  Plan:    Screening colonoscopy     HPI: Tonya [name] is a 72 y.o. female undergoing initial average risk screening colonoscopy.  She has no chronic GI symptoms such as chronic diarrhea, abdominal pain or blood in the stool.  She takes Benefiber daily.  She had a normal colonoscopy in 2013.  Her brother was diagnosed with colon cancer in her 22s.   Past Medical History:  Diagnosis Date   Allergy June 2012   Seasonal   Asthma 2015   Cancer [facility]) 2010   Left Breast   Coronary atherosclerosis due to calcified coronary lesion    GERD (gastroesophageal reflux disease)    Many years ago   History of breast cancer    Hyperlipidemia    Hypertension    Osteopenia     Past Surgical History:  Procedure Laterality Date   ABDOMINAL HYSTERECTOMY  2015   AUGMENTATION MAMMAPLASTY Left    BREAST BIOPSY Right 02/2019   BREAST IMPLANT EXCHANGE     COLONOSCOPY     COSMETIC SURGERY  2010   Breast cancer related   FOOT SURGERY Left 05/25/2020   MASTECTOMY Left    REDUCTION MAMMAPLASTY Right    ROTATOR CUFF REPAIR Right 07/17/2022   TONSILLECTOMY AND ADENOIDECTOMY  1958    Prior to Admission medications   Medication Sig Start Date End Date Taking? Authorizing Provider  aspirin EC (ASPIRIN LOW DOSE) 81 MG tablet TAKE 1 TABLET (81 MG TOTAL) BY MOUTH DAILY. SWALLOW WHOLE. 09/14/22  Yes Tolia, Sunit, DO  atenolol (TENORMIN) 50 MG tablet TAKE 1 TABLET BY MOUTH EVERY DAY 08/03/22  Yes Wendling, Jilda Roche, DO  calcium-vitamin D (OSCAL WITH D) 250-125 MG-UNIT tablet Take 1 tablet by mouth daily.   Yes [provider]  ezetimibe (ZETIA) 10 MG tablet TAKE 1 TABLET BY MOUTH EVERY DAY 03/31/23  Yes Tolia, Sunit, DO  losartan (COZAAR) 50 MG tablet TAKE 1 TABLET BY MOUTH EVERY DAY 02/08/23  Yes Sharlene Dory, DO  montelukast  (SINGULAIR) 10 MG tablet TAKE 1 TABLET BY MOUTH EVERYDAY AT BEDTIME 06/25/22  Yes Wendling, Jilda Roche, DO  Multiple Vitamins-Minerals (MULTIVITAMIN ADULTS 50+) TABS Take by mouth.   Yes [provider]  pantoprazole (PROTONIX) 20 MG tablet TAKE 1 TABLET BY MOUTH EVERY DAY 06/25/22  Yes Sharlene Dory, DO  lovastatin (MEVACOR) 10 MG tablet TAKE 1 TABLET BY MOUTH EVERYDAY AT BEDTIME 04/14/23   Sharlene Dory, DO    Current Outpatient Medications  Medication Sig Dispense Refill   aspirin EC (ASPIRIN LOW DOSE) 81 MG tablet TAKE 1 TABLET (81 MG TOTAL) BY MOUTH DAILY. SWALLOW WHOLE. 90 tablet 3   atenolol (TENORMIN) 50 MG tablet TAKE 1 TABLET BY MOUTH EVERY DAY 90 tablet 3   calcium-vitamin D (OSCAL WITH D) 250-125 MG-UNIT tablet Take 1 tablet by mouth daily.     ezetimibe (ZETIA) 10 MG tablet TAKE 1 TABLET BY MOUTH EVERY DAY 90 tablet 3   losartan (COZAAR) 50 MG tablet TAKE 1 TABLET BY MOUTH EVERY DAY 90 tablet 1   montelukast (SINGULAIR) 10 MG tablet TAKE 1 TABLET BY MOUTH EVERYDAY AT BEDTIME 90 tablet 3   Multiple Vitamins-Minerals (MULTIVITAMIN ADULTS 50+) TABS Take by mouth.     pantoprazole (PROTONIX) 20 MG tablet TAKE 1 TABLET BY MOUTH EVERY DAY 90 tablet 2  lovastatin (MEVACOR) 10 MG tablet TAKE 1 TABLET BY MOUTH EVERYDAY AT BEDTIME 75 tablet 0   Current Facility-Administered Medications  Medication Dose Route Frequency Provider Last Rate Last Admin   0.9 %  sodium chloride infusion  500 mL Intravenous Once Jenel Lucks, MD        Allergies as of 04/14/2023 - Review Complete 04/14/2023  Allergen Reaction Noted   Crestor [rosuvastatin]  05/19/2021   Lipitor [atorvastatin]  05/19/2021    Family History  Problem Relation Age of Onset   Heart attack Mother    Arthritis Mother    Early death Mother    Heart attack Father        died from   Early death Father    Colon cancer Brother 74 - 24   Lung disease Brother    Early death Brother    Cancer  Brother    Cancer Brother    Obesity Brother    Breast cancer Maternal Aunt    Breast cancer Paternal Aunt    Colon cancer Cousin 30 - 35   Esophageal cancer Neg Hx    Rectal cancer Neg Hx    Stomach cancer Neg Hx     Social History   Socioeconomic History   Marital status: Married    Spouse name: Not on file   Number of children: 1   Years of education: Not on file   Highest education level: Bachelor's degree (e.g., BA, AB, BS)  Occupational History   Not on file  Tobacco Use   Smoking status: Never   Smokeless tobacco: Never  Vaping Use   Vaping status: Never Used  Substance and Sexual Activity   Alcohol use: Yes    Comment: rarely   Drug use: Never   Sexual activity: Yes    Birth control/protection: None  Other Topics Concern   Not on file  Social History Narrative   Not on file   Social Determinants of Health   Financial Resource Strain: Low Risk  (11/23/2022)   Overall Financial Resource Strain (CARDIA)    Difficulty of Paying Living Expenses: Not hard at all  Food Insecurity: No Food Insecurity (11/23/2022)   Hunger Vital Sign    Worried About Running Out of Food in the Last Year: Never true    Ran Out of Food in the Last Year: Never true  Transportation Needs: No Transportation Needs (11/23/2022)   PRAPARE - Administrator, Civil Service (Medical): No    Lack of Transportation (Non-Medical): No  Physical Activity: Insufficiently Active (11/23/2022)   Exercise Vital Sign    Days of Exercise per Week: 3 days    Minutes of Exercise per Session: 20 min  Stress: No Stress Concern Present (11/23/2022)   [employer] of Occupational Health - Occupational Stress Questionnaire    Feeling of Stress : Not at all  Social Connections: Moderately Isolated (11/23/2022)   Social Connection and Isolation Panel [NHANES]    Frequency of Communication with Friends and Family: More than three times a week    Frequency of Social Gatherings with Friends and Family:  Once a week    Attends Religious Services: Never    Database administrator or Organizations: No    Attends Banker Meetings: Never    Marital Status: Married  Catering manager Violence: Not At Risk (11/19/2022)   Humiliation, Afraid, Rape, and Kick questionnaire    Fear of Current or Ex-Partner: No    Emotionally Abused:  No    Physically Abused: No    Sexually Abused: No    Review of Systems:  All other review of systems negative except as mentioned in the HPI.  Physical Exam: Vital signs BP 125/84   Pulse 83   Temp 98.4 F (36.9 C)   Ht 5\' 7"  (1.702 m)   Wt 200 lb (90.7 kg)   SpO2 96%   BMI 31.32 kg/m   General:   Alert,  Well-developed, well-nourished, pleasant and cooperative in NAD Airway:  Mallampati 1 Lungs:  Clear throughout to auscultation.   Heart:  Regular rate and rhythm; no murmurs, clicks, rubs,  or gallops. Abdomen:  Soft, nontender and nondistended. Normal bowel sounds.   Neuro/Psych:  Normal mood and affect. A and O x 3   Shelbee Apgar E. Tomasa Rand, MD [facility]

## 2023-04-14 NOTE — Progress Notes (Signed)
Pt's states no medical or surgical changes since previsit or office visit. 

## 2023-04-15 ENCOUNTER — Telehealth: Payer: Self-pay

## 2023-04-15 NOTE — Telephone Encounter (Signed)
  Follow up Call-     04/14/2023    7:43 AM  Call back number  Post procedure Call Back phone  # [phone]  Permission to leave phone message Yes     Patient questions:  Do you have a fever, pain , or abdominal swelling? No. Pain Score  0 *  Have you tolerated food without any problems? Yes.    Have you been able to return to your normal activities? Yes.    Do you have any questions about your discharge instructions: Diet   No. Medications  No. Follow up visit  No.  Do you have questions or concerns about your Care? No.  Actions: * If pain score is 4 or above: No action needed, pain <4.

## 2023-04-16 LAB — SURGICAL PATHOLOGY

## 2023-04-19 NOTE — Progress Notes (Signed)
Ms. Baine, The polyp removed from your colon was not precancerous.  Based on your age and lack of precancerous polyps, I think you are low risk for developing colon cancer in your lifetime and would recommend against further colon cancer screening.

## 2023-05-04 ENCOUNTER — Other Ambulatory Visit: Payer: Self-pay | Admitting: Family Medicine

## 2023-05-04 DIAGNOSIS — J4520 Mild intermittent asthma, uncomplicated: Secondary | ICD-10-CM

## 2023-05-05 DIAGNOSIS — M25511 Pain in right shoulder: Secondary | ICD-10-CM | POA: Diagnosis not present

## 2023-05-11 ENCOUNTER — Other Ambulatory Visit: Payer: Self-pay | Admitting: Family Medicine

## 2023-05-17 DIAGNOSIS — R413 Other amnesia: Secondary | ICD-10-CM | POA: Diagnosis not present

## 2023-05-31 ENCOUNTER — Encounter: Payer: Self-pay | Admitting: Neurology

## 2023-05-31 ENCOUNTER — Ambulatory Visit: Payer: PPO | Admitting: Neurology

## 2023-05-31 ENCOUNTER — Telehealth: Payer: Self-pay | Admitting: Neurology

## 2023-05-31 VITALS — BP 105/56 | HR 72 | Ht 67.0 in | Wt 205.0 lb

## 2023-05-31 DIAGNOSIS — R413 Other amnesia: Secondary | ICD-10-CM | POA: Diagnosis not present

## 2023-05-31 DIAGNOSIS — G3184 Mild cognitive impairment, so stated: Secondary | ICD-10-CM | POA: Insufficient documentation

## 2023-05-31 DIAGNOSIS — G309 Alzheimer's disease, unspecified: Secondary | ICD-10-CM | POA: Insufficient documentation

## 2023-05-31 NOTE — Telephone Encounter (Signed)
Received report bringing to POD

## 2023-05-31 NOTE — Telephone Encounter (Signed)
[facility] have sent over a urgent message to neuropsychology. Have put a rush on it. Just faxed over report. Patient is coming to appointment at your office today at 11:00am. Pt will also have a paper copy in case you do get the report.

## 2023-05-31 NOTE — Progress Notes (Addendum)
Provider:  Melvyn Novas, MD  Primary Care Physician:  Sharlene Dory, DO [address] [place] [address]     Referring Provider: Bluford Main [address] [place],  [place] [address]          Chief Complaint according to patient   Patient presents with:     New Patient (Initial Visit)           HISTORY OF PRESENT ILLNESS:  Tonya [name] is a 72 y.o. female patient who is here for revisit 05/31/2023 for follow up on memory concerns. .  Chief concern according to patient :  The apolipoprotein allele E 4 was not found ( that's  good) and ATN was positive for elevated P tau and abnormal APO 42/ 40 ratio, indicating AD pathology to be present.   The patient also underwent a sleep test and it was such a mild case of apnea that I recommended no intervention - by CMS criteria the patient's AHI was 5.4/h and we consider  an AHI of 5/h normal. Her sleep has been affected by joint pain. She had a very early breast cancer diagnosis.   Tonya [name] was then evaluated by Dr. Sheldon Silvan, PhD and this evaluation took place on 04-2023 , so very recently- the date of the report is 05-31-2023.  Tonya [name] and her husband reported a 1 year history of cognitive changes primarily difficulties with word finding and spelling.  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   She did have had a normal MRI of the brain.     Tonya [name] demonstrated a better than average neuropsychological profile except for 1 part the impaired verbal or semantic fluency which would be the description of her word finding difficulties.   At this time the diagnosis would still be mild cognitive impairment but in the setting of AD.  PET scan was ordered. The PET amyloid scan was actually on 03-30-2023 and it shows that there is an increased uptake seen in the cortical area of frontal,  temporal and occipital and parietal lobes but the cerebellum has no evidence of uptake= this is usually the picture associated with Alzheimer's disease.    So we do think there is pathology here but the manifestation is that of a mild cognitive impairment and she does not have a genetic predetermined risk of Alzheimer's disease to the stage of our knowledge at this time.  This is important for her children, of course.    She is interested in [facility] . She is at such early stage that her insurance may not cover the therapy also we know that she will progress =. We need to repeat the [facility] test in 3 months , if a trend to declining scores is present, I want to start therapy ASAP.      Review of Systems: Out of a complete 14 system review, the patient complains of only the following symptoms, and all other reviewed systems are negative.:         No data to display              03/01/2023    2:08 PM  [facility] Cognitive Assessment   Visuospatial/ Executive (0/5) 5  Naming (0/3) 3  Attention: Read list of digits (0/2) 2  Attention: Read list of letters (0/1) 1  Attention: Serial  7 subtraction starting at 100 (0/3) 3  Language: Repeat phrase (0/2) 2  Language : Fluency (0/1) 0  Abstraction (0/2) 2  Delayed Recall (0/5) 5  Orientation (0/6) 6  Total 29      Social History   Socioeconomic History   Marital status: Married    Spouse name: Not on file   Number of children: 1   Years of education: Not on file   Highest education level: Bachelor's degree (e.g., BA, AB, BS)  Occupational History   Not on file  Tobacco Use   Smoking status: Never   Smokeless tobacco: Never  Vaping Use   Vaping status: Never Used  Substance and Sexual Activity   Alcohol use: Yes    Comment: rarely   Drug use: Never   Sexual activity: Yes    Birth control/protection: None  Other Topics Concern   Not on file  Social History Narrative   Not on file   Social Determinants of Health    Financial Resource Strain: Low Risk  (11/23/2022)   Overall Financial Resource Strain (CARDIA)    Difficulty of Paying Living Expenses: Not hard at all  Food Insecurity: No Food Insecurity (11/23/2022)   Hunger Vital Sign    Worried About Running Out of Food in the Last Year: Never true    Ran Out of Food in the Last Year: Never true  Transportation Needs: No Transportation Needs (11/23/2022)   PRAPARE - Administrator, Civil Service (Medical): No    Lack of Transportation (Non-Medical): No  Physical Activity: Insufficiently Active (11/23/2022)   Exercise Vital Sign    Days of Exercise per Week: 3 days    Minutes of Exercise per Session: 20 min  Stress: No Stress Concern Present (11/23/2022)   [employer] of Occupational Health - Occupational Stress Questionnaire    Feeling of Stress : Not at all  Social Connections: Moderately Isolated (11/23/2022)   Social Connection and Isolation Panel [NHANES]    Frequency of Communication with Friends and Family: More than three times a week    Frequency of Social Gatherings with Friends and Family: Once a week    Attends Religious Services: Never    Database administrator or Organizations: No    Attends Engineer, structural: Never    Marital Status: Married    Family History  Problem Relation Age of Onset   Heart attack Mother    Arthritis Mother    Early death Mother    Heart attack Father        died from   Early death Father    Colon cancer Brother 53 - 2   Lung disease Brother    Early death Brother    Cancer Brother    Cancer Brother    Obesity Brother    Breast cancer Maternal Aunt    Breast cancer Paternal Aunt    Colon cancer Cousin 30 - 35   Esophageal cancer Neg Hx    Rectal cancer Neg Hx    Stomach cancer Neg Hx     Past Medical History:  Diagnosis Date   Allergy June 2012   Seasonal   Asthma 2015   Cancer [facility] 2010   Left Breast   Coronary atherosclerosis due to calcified coronary lesion     GERD (gastroesophageal reflux disease)    Many years ago   History of breast cancer    Hyperlipidemia    Hypertension    Osteopenia  Past Surgical History:  Procedure Laterality Date   ABDOMINAL HYSTERECTOMY  2015   AUGMENTATION MAMMAPLASTY Left    BREAST BIOPSY Right 02/2019   BREAST IMPLANT EXCHANGE     COLONOSCOPY     COSMETIC SURGERY  2010   Breast cancer related   FOOT SURGERY Left 05/25/2020   MASTECTOMY Left    REDUCTION MAMMAPLASTY Right    ROTATOR CUFF REPAIR Right 07/17/2022   TONSILLECTOMY AND ADENOIDECTOMY  1958     Current Outpatient Medications on File Prior to Visit  Medication Sig Dispense Refill   aspirin EC (ASPIRIN LOW DOSE) 81 MG tablet TAKE 1 TABLET (81 MG TOTAL) BY MOUTH DAILY. SWALLOW WHOLE. 90 tablet 3   atenolol (TENORMIN) 50 MG tablet TAKE 1 TABLET BY MOUTH EVERY DAY 90 tablet 3   calcium-vitamin D (OSCAL WITH D) 250-125 MG-UNIT tablet Take 1 tablet by mouth daily.     ezetimibe (ZETIA) 10 MG tablet TAKE 1 TABLET BY MOUTH EVERY DAY 90 tablet 3   losartan (COZAAR) 50 MG tablet TAKE 1 TABLET BY MOUTH EVERY DAY 90 tablet 1   lovastatin (MEVACOR) 10 MG tablet TAKE 1 TABLET BY MOUTH EVERYDAY AT BEDTIME 75 tablet 0   montelukast (SINGULAIR) 10 MG tablet TAKE 1 TABLET BY MOUTH EVERYDAY AT BEDTIME 90 tablet 3   Multiple Vitamins-Minerals (MULTIVITAMIN ADULTS 50+) TABS Take by mouth.     pantoprazole (PROTONIX) 20 MG tablet Take 1 tablet (20 mg total) by mouth daily. 90 tablet 0   No current facility-administered medications on file prior to visit.    Allergies  Allergen Reactions   Crestor [Rosuvastatin]     Myalgias    Lipitor [Atorvastatin]     myalgias     DIAGNOSTIC DATA (LABS, IMAGING, TESTING) - I reviewed patient records, labs, notes, testing and imaging myself where available.  Lab Results  Component Value Date   WBC 9.3 02/03/2023   HGB 13.7 02/03/2023   HCT 42.4 02/03/2023   MCV 100.5 (H) 02/03/2023   PLT 206.0 02/03/2023       Component Value Date/Time   NA 138 02/03/2023 1411   NA 142 12/03/2021 0850   K 3.9 02/03/2023 1411   CL 102 02/03/2023 1411   CO2 28 02/03/2023 1411   GLUCOSE 102 (H) 02/03/2023 1411   BUN 16 02/03/2023 1411   BUN 17 12/03/2021 0850   CREATININE 0.83 02/03/2023 1411   CALCIUM 9.7 02/03/2023 1411   PROT 7.2 02/03/2023 1411   PROT 7.0 12/03/2021 0850   ALBUMIN 4.6 02/03/2023 1411   ALBUMIN 4.6 12/03/2021 0850   AST 21 02/03/2023 1411   ALT 15 02/03/2023 1411   ALKPHOS 75 02/03/2023 1411   BILITOT 0.7 02/03/2023 1411   BILITOT 0.8 12/03/2021 0850   GFRNONAA 75 08/25/2019 0919   GFRAA 87 08/25/2019 0919   Lab Results  Component Value Date   CHOL 179 03/02/2023   HDL 67 03/02/2023   LDLCALC 94 03/02/2023   LDLDIRECT 99 03/02/2023   TRIG 101 03/02/2023   CHOLHDL 2 06/19/2022   Lab Results  Component Value Date   HGBA1C 5.7 05/08/2022   Lab Results  Component Value Date   VITAMINB12 363 02/03/2023   Lab Results  Component Value Date   TSH 1.31 09/20/2020    PHYSICAL EXAM:  Today's Vitals   05/31/23 1307  BP: (!) 105/56  Pulse: 72  Weight: 205 lb (93 kg)  Height: 5\' 7"  (1.702 m)   Body mass index is 32.11  kg/m.   Wt Readings from Last 3 Encounters:  05/31/23 205 lb (93 kg)  04/14/23 200 lb (90.7 kg)  03/18/23 200 lb (90.7 kg)     Ht Readings from Last 3 Encounters:  05/31/23 5\' 7"  (1.702 m)  04/14/23 5\' 7"  (1.702 m)  03/18/23 5\' 7"  (1.702 m)      General: General: The patient is awake, alert and appears not in acute distress.  The patient is well groomed. Head: Normocephalic, atraumatic.  Neck is supple. No Goiter   Cardiovascular:  Regular rate and palpable peripheral pulse:  Respiratory: clear Skin:  Without evidence of edema, or rash Trunk: patient has normal posture.     Neurologic exam : The patient is awake and alert, oriented to place and time.  Memory subjective described as impaired: the conversation is fluent , well within time  frame and fully oriented.   There is a normal attention span & concentration ability.  Speech is fluent without dysarthria, very mild dysphonia - and no aphasia in conversation.  Mood and affect are anxious.    Cranial nerves: Pupils are equal and briskly reactive to light. Funduscopic exam without  evidence of pallor or edema. Extraocular movements  in vertical and horizontal planes intact and without nystagmus. Visual fields by finger perimetry are intact. Hearing to finger rub intact.  Facial sensation intact to fine touch. Facial motor strength is symmetric and tongue and uvula move midline.   Motor exam:   Normal tone and normal muscle bulk and symmetric normal strength in all extremities. Grip Strength intact . Restricted ROM for neck , rotation and flexion and extension.  Proximal strength of shoulder muscles and hip flexors was intact .   Sensory:  Fine touch and vibration were tested. Proprioception was tested in the upper extremities only and was  normal.   Coordination: Rapid alternating movements in the fingers/hands were normal.  Finger-to-nose maneuver was tested and showed no evidence of ataxia, dysmetria or tremor.   Gait and station: Patient walked without assistive device . Core Strength within normal limits. Stance is stable and of normal base.    Deep tendon reflexes: in the  upper and lower extremities are symmetric and not brisk. Babinski maneuver response is downgoing.    ASSESSMENT AND PLAN 72 y.o. year old female  here with:    1) Very mild symptoms, but considered MCI- see Dr Rueben Bash .  She tested positive for Alzheimer's pathology by PET scan for amyloid, the blood based ATN profile and was negative for apolipoprotein E 4.   2) see Dr Renfroe's detailed notes from last month's evaluation. Word fluency and semantic memory are affected.   3) MRI does not support any brian hemorrhages in the past-  she would be an excellent candidate for a MAB treatment.    I will file for LEQEMBI to start.   I plan to follow up either personally or through our NP within 3 months.   I would like to thank Sharlene Dory, DO allowing me to meet with and to take care of this pleasant patient.   CC: I will share my notes with Dr Rueben Bash.   After spending a total time of  30  minutes face to face and additional time for physical and neurologic examination, review of laboratory studies,  personal review of imaging studies, reports and results of other testing and review of referral information / records as far as provided in visit,   Electronically signed by: Melvyn Novas,  MD 05/31/2023 1:49 PM  [facility] and [employer] certified by [employer] of Sleep Medicine and Diplomate of the [employer] of Sleep Medicine. Board certified In Neurology through the ABPN, Fellow of the [employer] of Neurology.

## 2023-05-31 NOTE — Telephone Encounter (Signed)
Added report to patient papers for today's appointment in folder in POD

## 2023-05-31 NOTE — Patient Instructions (Signed)
Lecanemab Injection What is this medication? LECANEMAB (lek AN e mab) treats Alzheimer disease. It works by decreasing the buildup of amyloid, a protein that may cause Alzheimer disease. This may slow down the worsening of symptoms. It is a monoclonal antibody. This medicine may be used for other purposes; ask your health care provider or pharmacist if you have questions. COMMON BRAND NAME(S): LEQEMBI What should I tell my care team before I take this medication? They need to know if you have any of these conditions: An unusual or allergic reaction to lecanemab, other medications, foods, dyes, or preservatives Take medications that treat or prevent blood clots Pregnant or trying to get pregnant Breast-feeding How should I use this medication? This medication is injected into a vein. It is given by your care team in a hospital or clinic setting. A special MedGuide will be given to you before each treatment. Be sure to read this information carefully each time. Talk to your care team about the use of this medication in children. Special care may be needed. Overdosage: If you think you have taken too much of this medicine contact a poison control center or emergency room at once. NOTE: This medicine is only for you. Do not share this medicine with others. What if I miss a dose? Keep appointments for follow-up doses. It is important not to miss your dose. Call your care team if you are unable to keep an appointment. What may interact with this medication? Interactions have not been studied. This list may not describe all possible interactions. Give your health care provider a list of all the medicines, herbs, non-prescription drugs, or dietary supplements you use. Also tell them if you smoke, drink alcohol, or use illegal drugs. Some items may interact with your medicine. What should I watch for while using this medication? Visit your care team for regular checks on your progress. Tell your care  team if your symptoms do not start to get better or if they get worse. What side effects may I notice from receiving this medication? Side effects that you should report to your care team as soon as possible: Allergic reactions or angioedema--skin rash, itching or hives, swelling of the face, eyes, lips, tongue, arms, or legs, trouble swallowing or breathing Headache, worsening confusion, dizziness, change in vision, nausea, seizures Infusion reactions--chest pain, shortness of breath or trouble breathing, feeling faint or lightheaded Side effects that usually do not require medical attention (report these to your care team if they continue or are bothersome): Cough Diarrhea Headache This list may not describe all possible side effects. Call your doctor for medical advice about side effects. You may report side effects to FDA at 1-800-FDA-1088. Where should I keep my medication? This medication is given in a hospital or clinic. It will not be stored at home. NOTE: This sheet is a summary. It may not cover all possible information. If you have questions about this medicine, talk to your doctor, pharmacist, or health care provider.  2024 Elsevier/Gold Standard (2021-12-30 00:00:00)

## 2023-06-02 ENCOUNTER — Encounter: Payer: Self-pay | Admitting: Family Medicine

## 2023-06-03 ENCOUNTER — Other Ambulatory Visit: Payer: Self-pay | Admitting: Family Medicine

## 2023-06-03 ENCOUNTER — Ambulatory Visit (HOSPITAL_BASED_OUTPATIENT_CLINIC_OR_DEPARTMENT_OTHER)
Admission: RE | Admit: 2023-06-03 | Discharge: 2023-06-03 | Disposition: A | Discharge status: Home / Self Care | Payer: PPO | Source: Ambulatory Visit | Attending: Family Medicine | Admitting: Family Medicine

## 2023-06-03 DIAGNOSIS — Z0000 Encounter for general adult medical examination without abnormal findings: Secondary | ICD-10-CM

## 2023-06-03 DIAGNOSIS — M160 Bilateral primary osteoarthritis of hip: Secondary | ICD-10-CM | POA: Diagnosis not present

## 2023-06-03 DIAGNOSIS — I878 Other specified disorders of veins: Secondary | ICD-10-CM | POA: Diagnosis not present

## 2023-06-03 DIAGNOSIS — M8580 Other specified disorders of bone density and structure, unspecified site: Secondary | ICD-10-CM

## 2023-06-03 DIAGNOSIS — E7800 Pure hypercholesterolemia, unspecified: Secondary | ICD-10-CM

## 2023-06-03 DIAGNOSIS — M25552 Pain in left hip: Secondary | ICD-10-CM | POA: Insufficient documentation

## 2023-06-03 DIAGNOSIS — M25551 Pain in right hip: Secondary | ICD-10-CM | POA: Diagnosis not present

## 2023-06-18 ENCOUNTER — Encounter: Payer: Self-pay | Admitting: Neurology

## 2023-06-24 ENCOUNTER — Telehealth: Payer: Self-pay | Admitting: Neurology

## 2023-06-24 NOTE — Telephone Encounter (Signed)
 Forms and paperwork completed and submitted to [employer] for them to work on the process of getting the patient coverage.

## 2023-06-29 ENCOUNTER — Other Ambulatory Visit: Payer: Self-pay | Admitting: Family Medicine

## 2023-06-30 ENCOUNTER — Ambulatory Visit (INDEPENDENT_AMBULATORY_CARE_PROVIDER_SITE_OTHER): Payer: PPO | Admitting: Family Medicine

## 2023-06-30 ENCOUNTER — Encounter: Payer: Self-pay | Admitting: Family Medicine

## 2023-06-30 VITALS — BP 112/68 | HR 81 | Temp 98.0°F | Resp 16 | Ht 67.0 in | Wt 207.0 lb

## 2023-06-30 DIAGNOSIS — M25511 Pain in right shoulder: Secondary | ICD-10-CM | POA: Diagnosis not present

## 2023-06-30 DIAGNOSIS — M25512 Pain in left shoulder: Secondary | ICD-10-CM | POA: Diagnosis not present

## 2023-06-30 DIAGNOSIS — R5383 Other fatigue: Secondary | ICD-10-CM

## 2023-06-30 DIAGNOSIS — M79605 Pain in left leg: Secondary | ICD-10-CM

## 2023-06-30 DIAGNOSIS — M79604 Pain in right leg: Secondary | ICD-10-CM

## 2023-06-30 DIAGNOSIS — M542 Cervicalgia: Secondary | ICD-10-CM

## 2023-06-30 NOTE — Patient Instructions (Addendum)
 Keep the diet clean and stay active.  Aim to do some physical exertion for 150 minutes per week. This is typically divided into 5 days per week, 30 minutes per day. The activity should be enough to get your heart rate up. Anything is better than nothing if you have time constraints.  Heat (pad or rice pillow in microwave) over affected area, 10-15 minutes twice daily.   Ice/cold pack over area for 10-15 min twice daily.  Stay active with your home exercise program.   If you do not hear anything about your referral in the next 1-2 weeks, call our office and ask for an update.  Hold your Mevacor  for 2 weeks and update me with how you are doing.   Please consider counseling. Contact [phone] to schedule an appointment or inquire about cost/insurance coverage.  [facility] located at [address], [address], [place], [place].  Phone number = [phone].  Dr. Verdel Silk - Adult Psychiatry.    [facility] located at [address] [place], [place], [place]. Phone number = [phone].   [facility] located at [address] [place], [place].  Phone number = [phone].   [facility] located at [address] [place], [place], [place].  Phone number = [phone].  Let us  know if you need anything.  EXERCISES RANGE OF MOTION (ROM) AND STRETCHING EXERCISES  These exercises may help you when beginning to rehabilitate your issue. In order to successfully resolve your symptoms, you must improve your posture. These exercises are designed to help reduce the forward-head and rounded-shoulder posture which contributes to this condition. Your symptoms may resolve with or without further involvement from your physician, physical therapist or athletic trainer. While completing these exercises, remember:  Restoring tissue flexibility helps normal motion to return to the joints. This allows healthier, less painful movement and activity. An  effective stretch should be held for at least 20 seconds, although you may need to begin with shorter hold times for comfort. A stretch should never be painful. You should only feel a gentle lengthening or release in the stretched tissue. Do not do any stretch or exercise that you cannot tolerate.  STRETCH- Axial Extensors Lie on your back on the floor. You may bend your knees for comfort. Place a rolled-up hand towel or dish towel, about 2 inches in diameter, under the part of your head that makes contact with the floor. Gently tuck your chin, as if trying to make a double chin, until you feel a gentle stretch at the base of your head. Hold 15-20 seconds. Repeat 2-3 times. Complete this exercise 1 time per day.   STRETCH - Axial Extension  Stand or sit on a firm surface. Assume a good posture: chest up, shoulders drawn back, abdominal muscles slightly tense, knees unlocked (if standing) and feet hip width apart. Slowly retract your chin so your head slides back and your chin slightly lowers. Continue to look straight ahead. You should feel a gentle stretch in the back of your head. Be certain not to feel an aggressive stretch since this can cause headaches later. Hold for 15-20 seconds. Repeat 2-3 times. Complete this exercise 1 time per day.  STRETCH - Cervical Side Bend  Stand or sit on a firm surface. Assume a good posture: chest up, shoulders drawn back, abdominal muscles slightly tense, knees unlocked (if standing) and feet hip width apart. Without letting your nose or shoulders move, slowly tip your right / left ear  to your shoulder until your feel a gentle stretch in the muscles on the opposite side of your neck. Hold 15-20 seconds. Repeat 2-3 times. Complete this exercise 1-2 times per day.  STRETCH - Cervical Rotators  Stand or sit on a firm surface. Assume a good posture: chest up, shoulders drawn back, abdominal muscles slightly tense, knees unlocked (if standing) and feet hip  width apart. Keeping your eyes level with the ground, slowly turn your head until you feel a gentle stretch along the back and opposite side of your neck. Hold 15-20 seconds. Repeat 2-3 times. Complete this exercise 1-2 times per day.  RANGE OF MOTION - Neck Circles  Stand or sit on a firm surface. Assume a good posture: chest up, shoulders drawn back, abdominal muscles slightly tense, knees unlocked (if standing) and feet hip width apart. Gently roll your head down and around from the back of one shoulder to the back of the other. The motion should never be forced or painful. Repeat the motion 10-20 times, or until you feel the neck muscles relax and loosen. Repeat 2-3 times. Complete the exercise 1-2 times per day. STRENGTHENING EXERCISES - Cervical Strain and Sprain These exercises may help you when beginning to rehabilitate your injury. They may resolve your symptoms with or without further involvement from your physician, physical therapist, or athletic trainer. While completing these exercises, remember:  Muscles can gain both the endurance and the strength needed for everyday activities through controlled exercises. Complete these exercises as instructed by your physician, physical therapist, or athletic trainer. Progress the resistance and repetitions only as guided. You may experience muscle soreness or fatigue, but the pain or discomfort you are trying to eliminate should never worsen during these exercises. If this pain does worsen, stop and make certain you are following the directions exactly. If the pain is still present after adjustments, discontinue the exercise until you can discuss the trouble with your clinician.  STRENGTH - Cervical Flexors, Isometric Face a wall, standing about 6 inches away. Place a small pillow, a ball about 6-8 inches in diameter, or a folded towel between your forehead and the wall. Slightly tuck your chin and gently push your forehead into the soft object.  Push only with mild to moderate intensity, building up tension gradually. Keep your jaw and forehead relaxed. Hold 10 to 20 seconds. Keep your breathing relaxed. Release the tension slowly. Relax your neck muscles completely before you start the next repetition. Repeat 2-3 times. Complete this exercise 1 time per day.  STRENGTH- Cervical Lateral Flexors, Isometric  Stand about 6 inches away from a wall. Place a small pillow, a ball about 6-8 inches in diameter, or a folded towel between the side of your head and the wall. Slightly tuck your chin and gently tilt your head into the soft object. Push only with mild to moderate intensity, building up tension gradually. Keep your jaw and forehead relaxed. Hold 10 to 20 seconds. Keep your breathing relaxed. Release the tension slowly. Relax your neck muscles completely before you start the next repetition. Repeat 2-3 times. Complete this exercise 1 time per day.  STRENGTH - Cervical Extensors, Isometric  Stand about 6 inches away from a wall. Place a small pillow, a ball about 6-8 inches in diameter, or a folded towel between the back of your head and the wall. Slightly tuck your chin and gently tilt your head back into the soft object. Push only with mild to moderate intensity, building up tension gradually.  Keep your jaw and forehead relaxed. Hold 10 to 20 seconds. Keep your breathing relaxed. Release the tension slowly. Relax your neck muscles completely before you start the next repetition. Repeat 2-3 times. Complete this exercise 1 time per day.  POSTURE AND BODY MECHANICS CONSIDERATIONS Keeping correct posture when sitting, standing or completing your activities will reduce the stress put on different body tissues, allowing injured tissues a chance to heal and limiting painful experiences. The following are general guidelines for improved posture. Your physician or physical therapist will provide you with any instructions specific to your needs.  While reading these guidelines, remember: The exercises prescribed by your provider will help you have the flexibility and strength to maintain correct postures. The correct posture provides the optimal environment for your joints to work. All of your joints have less wear and tear when properly supported by a spine with good posture. This means you will experience a healthier, less painful body. Correct posture must be practiced with all of your activities, especially prolonged sitting and standing. Correct posture is as important when doing repetitive low-stress activities (typing) as it is when doing a single heavy-load activity (lifting).  PROLONGED STANDING WHILE SLIGHTLY LEANING FORWARD When completing a task that requires you to lean forward while standing in one place for a long time, place either foot up on a stationary 2- to 4-inch high object to help maintain the best posture. When both feet are on the ground, the low back tends to lose its slight inward curve. If this curve flattens (or becomes too large), then the back and your other joints will experience too much stress, fatigue more quickly, and can cause pain.   RESTING POSITIONS Consider which positions are most painful for you when choosing a resting position. If you have pain with flexion-based activities (sitting, bending, stooping, squatting), choose a position that allows you to rest in a less flexed posture. You would want to avoid curling into a fetal position on your side. If your pain worsens with extension-based activities (prolonged standing, working overhead), avoid resting in an extended position such as sleeping on your stomach. Most people will find more comfort when they rest with their spine in a more neutral position, neither too rounded nor too arched. Lying on a non-sagging bed on your side with a pillow between your knees, or on your back with a pillow under your knees will often provide some relief. Keep in mind,  being in any one position for a prolonged period of time, no matter how correct your posture, can still lead to stiffness.  WALKING Walk with an upright posture. Your ears, shoulders, and hips should all line up. OFFICE WORK When working at a desk, create an environment that supports good, upright posture. Without extra support, muscles fatigue and lead to excessive strain on joints and other tissues.  CHAIR: A chair should be able to slide under your desk when your back makes contact with the back of the chair. This allows you to work closely. The chair's height should allow your eyes to be level with the upper part of your monitor and your hands to be slightly lower than your elbows. Body position: Your feet should make contact with the floor. If this is not possible, use a foot rest. Keep your ears over your shoulders. This will reduce stress on your neck and low back.

## 2023-06-30 NOTE — Progress Notes (Signed)
 Chief Complaint  Patient presents with   Fatigue    Fatigue and arm pain    Subjective: Patient is a 73 y.o. female here for fatigue and arm pain. Here spouse.   Patient had a rotator cuff repair done almost a year ago.  She was working with physical therapy and improving.  Visit stopped and she has lost some range of motion and having the pinching sensation.  It is spreading to the left side.  She would very much like to avoid an injection or repeat procedure.  She is wondering if she can do physical therapy again.  She has not been as diligent with her home exercise plan from them.  Patient has a history of sciatica.  She has bilateral pain in both of her legs.  She is statin intolerant but currently, as far as we know, tolerating Mevacor  10 mg daily.  She reports compliance and no adverse effects.  She wonders if this is related to the medication or just sciatica.  No recent injury or change in activity.  Fatigue: going on for a few years. Sleeping better as pain getting better in shoulders.  Her energy levels are not.  She has been diagnosed with amyloid dementia.  Based off of her research online, she thinks it could be related as well.  Past Medical History:  Diagnosis Date   Allergy June 2012   Seasonal   Asthma 2015   Cancer [facility]) 2010   Left Breast   Coronary atherosclerosis due to calcified coronary lesion    GERD (gastroesophageal reflux disease)    Many years ago   History of breast cancer    Hyperlipidemia    Hypertension    Osteopenia     Objective: BP 112/68   Pulse 81   Temp 98 F (36.7 C) (Oral)   Resp 16   Ht 5' 7 (1.702 m)   Wt 207 lb (93.9 kg)   SpO2 96%   BMI 32.42 kg/m  General: Awake, appears stated age Heart: RRR, no LE edema Lungs: CTAB, no rales, wheezes or rhonchi. No accessory muscle use Neuro: DTR's equal and symmetric throughout, no clonus, no cerebellar signs, 5/5 strength throughout MSK: Mild ttp over subocc triangle region on R;  decreased active/passive ROM of R shoulder, +Neer's; negative crossover's, O'Brien's, speeds Psych: Age appropriate judgment and insight, normal affect and mood  Assessment and Plan: Fatigue, unspecified type  Neck pain  Bilateral shoulder pain, unspecified chronicity - Plan: Ambulatory referral to Physical Therapy  Leg pain, bilateral  Chronic, not currently controlled.  Counseling information provided.  Anxiety/depression medicine politely declined. Apnea very mild, not worthy of treatment.  Will reach out to her neurologist to see if is related to positive tau protein or amyloid.  She could stand exercise more.  Keep diet clean.  Previous lab workup has been unremarkable.  Sleep is better overall. Stretches and exercises provided today.  Likely suboccipital triangle musculature pinching down on the occipital nerve.  Heat, ice, Tylenol  otherwise. We will get her back in with physical therapy.  Encouraged her to resume compliance with her home exercise program.  She did benefit from physical therapy. Possibly radiation from her low back.  Will stop Mevacor  for 2 weeks.  Send message updating me on how she is doing either way.  May need to just take a drug holiday versus changing statins. The patient voiced understanding and agreement to the plan.  I spent 48 minutes with the patient and her spouse  discussing the above plans in addition to reviewing her chart on the same day of the visit.  Mabel [place], DO 06/30/23  1:56 PM

## 2023-07-01 ENCOUNTER — Encounter: Payer: Self-pay | Admitting: Family Medicine

## 2023-07-01 ENCOUNTER — Ambulatory Visit: Payer: PPO | Admitting: Neurology

## 2023-07-02 ENCOUNTER — Ambulatory Visit: Payer: PPO | Admitting: Family Medicine

## 2023-07-06 ENCOUNTER — Ambulatory Visit: Payer: PPO | Admitting: Physical Therapy

## 2023-07-09 ENCOUNTER — Telehealth: Payer: Self-pay | Admitting: Cardiology

## 2023-07-09 MED ORDER — EZETIMIBE 10 MG PO TABS: 10.0000 mg | ORAL_TABLET | Freq: Every day | ORAL | 2 refills | Status: DC

## 2023-07-09 NOTE — Telephone Encounter (Signed)
*  STAT* If patient is at the pharmacy, call can be transferred to refill team.   1. Which medications need to be refilled? (please list name of each medication and dose if known)   ezetimibe (ZETIA) 10 MG tablet     2. Would you like to learn more about the convenience, safety, & potential cost savings by using the [facility]? No   3. Are you open to using the [facility] (Type [facility]. ) No   4. Which pharmacy/location (including street and city if local pharmacy) is medication to be sent to?[id number] - [place], [place] - [address]    5. Do they need a 30 day or 90 day supply? 90 day

## 2023-07-09 NOTE — Telephone Encounter (Signed)
Pt's medication was sent to pt's pharmacy as requested. Confirmation received.  °

## 2023-07-14 ENCOUNTER — Encounter: Payer: Self-pay | Admitting: Physical Therapy

## 2023-07-14 ENCOUNTER — Other Ambulatory Visit: Payer: Self-pay

## 2023-07-14 ENCOUNTER — Ambulatory Visit: Payer: PPO | Attending: Family Medicine | Admitting: Physical Therapy

## 2023-07-14 DIAGNOSIS — M25512 Pain in left shoulder: Secondary | ICD-10-CM | POA: Diagnosis not present

## 2023-07-14 DIAGNOSIS — G8929 Other chronic pain: Secondary | ICD-10-CM | POA: Diagnosis not present

## 2023-07-14 DIAGNOSIS — M25511 Pain in right shoulder: Secondary | ICD-10-CM | POA: Diagnosis not present

## 2023-07-14 DIAGNOSIS — M6281 Muscle weakness (generalized): Secondary | ICD-10-CM | POA: Diagnosis not present

## 2023-07-14 DIAGNOSIS — R29898 Other symptoms and signs involving the musculoskeletal system: Secondary | ICD-10-CM | POA: Diagnosis not present

## 2023-07-14 DIAGNOSIS — M5459 Other low back pain: Secondary | ICD-10-CM

## 2023-07-14 NOTE — Therapy (Deleted)
OUTPATIENT PHYSICAL THERAPY SHOULDER EVALUATION   Patient Name: Tonya [name] MRN: [id number] DOB:May 04, 1951, 73 y.o., female Today's Date: 07/14/2023   END OF SESSION:   Past Medical History:  Diagnosis Date   Allergy June 2012   Seasonal   Asthma 2015   Cancer [facility]) 2010   Left Breast   Coronary atherosclerosis due to calcified coronary lesion    GERD (gastroesophageal reflux disease)    Many years ago   History of breast cancer    Hyperlipidemia    Hypertension    Osteopenia    Past Surgical History:  Procedure Laterality Date   ABDOMINAL HYSTERECTOMY  2015   AUGMENTATION MAMMAPLASTY Left    BREAST BIOPSY Right 02/2019   BREAST IMPLANT EXCHANGE     COLONOSCOPY     COSMETIC SURGERY  2010   Breast cancer related   FOOT SURGERY Left 05/25/2020   MASTECTOMY Left    REDUCTION MAMMAPLASTY Right    ROTATOR CUFF REPAIR Right 07/17/2022   TONSILLECTOMY AND ADENOIDECTOMY  1958   Patient Active Problem List   Diagnosis Date Noted   MCI (mild cognitive impairment) 05/31/2023   Alzheimer's disease, unspecified (CODE) (HCC) 05/31/2023   Persistent disorder of initiating or maintaining sleep 04/13/2023   Short-term memory loss 03/01/2023   History of breast cancer 03/01/2023   Acute bursitis of right shoulder 04/21/2022   Other bursal cyst, left ankle and foot 04/21/2022   Coronary artery disease due to lipid rich plaque 09/30/2021   Hepatic steatosis 09/30/2021   Adenoma of left adrenal gland 09/30/2021   Strain of left trapezius muscle 09/30/2021   Hyperlipidemia 05/19/2021   Somatic dysfunction of spine, thoracic 03/26/2021   Chronic left-sided thoracic back pain 12/30/2020   DCIS (ductal carcinoma in situ) 01/26/2020   Low back pain 01/26/2020   Chronic pain of both knees 09/19/2019   Osteopenia    Estrogen deficiency 04/14/2018   Essential hypertension 01/10/2018   Gastroesophageal reflux disease 01/10/2018   Mild intermittent asthma without complication  01/10/2018   Pain in left lower leg 03/03/2017   Family history of colon cancer 02/23/2017   Herpes simplex vulvovaginitis 02/18/2017   Neuropathy 02/18/2017   Elevated liver enzymes 02/17/2017   Need for Streptococcus pneumoniae vaccination 02/17/2017   Chondromalacia of left patella 12/28/2016   Synovitis of left knee 12/28/2016   Seasonal allergic rhinitis due to fungal spores 09/25/2016   Bone mass 09/24/2016   Chronic hip pain 09/24/2016   Dark yellow-colored urine 09/24/2016   Low oxygen saturation 09/24/2016   Other specified postprocedural states 01/17/2015   Abnormal stress electrocardiogram test 10/20/2013   Breast mass 12/11/2010   Family history of malignant neoplasm of breast 12/11/2010    PCP: Sharlene Dory, DO   REFERRING PROVIDER: Sharlene Dory, DO  REFERRING DIAG: [phone] (ICD-10-CM) - Bilateral shoulder pain, unspecified chronicity   THERAPY DIAG:  No diagnosis found.  RATIONALE FOR EVALUATION AND TREATMENT: Rehabilitation  ONSET DATE: ***  NEXT MD VISIT: *** None scheduled   SUBJECTIVE:  SUBJECTIVE STATEMENT: ***  PAIN: Are you having pain? {OPRCPAIN:27236}  PERTINENT HISTORY:  ***R RCR?, osteopenia, L breast cancer, HTN, CAD, Alzheimer's dementia, LBP, sciatica, neuropathy, fatigue  PRECAUTIONS: {Therapy precautions:24002}  RED FLAGS: {PT Red Flags:29287}  HAND DOMINANCE: {Hand Dominance:29389}  WEIGHT BEARING RESTRICTIONS: {Yes ***/No:24003}  FALLS:  Has patient fallen in last 6 months? {fallsyesno:27318}  LIVING ENVIRONMENT: Lives with: {OPRC lives with:25569::"lives with their family"} Lives in: {Lives in:25570} Stairs: {opstairs:27293} Has following equipment at home: {Assistive devices:23999}  OCCUPATION:  ***  PLOF: {PLOF:24004}  PATIENT GOALS: ***   OBJECTIVE: (objective measures completed at initial evaluation unless otherwise dated)  DIAGNOSTIC FINDINGS:  ***06/11/22 - R shoulder MRI: IMPRESSION: 1. Severe tendinosis of the supraspinatus tendon with a large full-thickness, near complete, tear with 3.8 cm of retraction and a few intact anterior most fibers. 2. Moderate tendinosis of the infraspinatus tendon with a tiny interstitial tear. 3. Mild tendinosis of the intra-articular portion of the long head of the biceps tendon. 04/21/22 - DG R shoulder: IMPRESSION: No fracture or dislocation of the right shoulder. Mild acromioclavicular and glenohumeral arthrosis.  PATIENT SURVEYS:  Quick Dash ***  COGNITION: Overall cognitive status: {cognition:24006}     SENSATION: {sensation:27233}  POSTURE: {posture:25561}  UPPER EXTREMITY ROM:   {AROM/PROM:27142} ROM Right eval Left eval  Shoulder flexion    Shoulder extension    Shoulder abduction    Shoulder adduction    Shoulder internal rotation    Shoulder external rotation    Elbow flexion    Elbow extension    Wrist flexion    Wrist extension    Wrist ulnar deviation    Wrist radial deviation    Wrist pronation    Wrist supination    (Blank rows = not tested)  UPPER EXTREMITY MMT:  MMT Right eval Left eval  Shoulder flexion    Shoulder extension    Shoulder abduction    Shoulder adduction    Shoulder internal rotation    Shoulder external rotation    Middle trapezius    Lower trapezius    Elbow flexion    Elbow extension    Wrist flexion    Wrist extension    Wrist ulnar deviation    Wrist radial deviation    Wrist pronation    Wrist supination    Grip strength (lbs)    (Blank rows = not tested)  SHOULDER SPECIAL TESTS: Impingement tests: {shoulder impingement test:25231:a} SLAP lesions: {SLAP lesions:25232} Instability tests: {shoulder instability test:25233} Rotator cuff assessment: {rotator cuff  assessment:25234} Biceps assessment: {biceps assessment:25235}  JOINT MOBILITY TESTING:  ***  PALPATION:  ***    TODAY'S TREATMENT:   ***   PATIENT EDUCATION:  Education details: {Education details:27468}  Person educated: {Person educated:25204} Education method: {Education Method:25205} Education comprehension: {Education Comprehension:25206}  HOME EXERCISE PROGRAM: ***   ASSESSMENT:  CLINICAL IMPRESSION: ALEINA CONTANT is a 73 y.o. female who was referred to physical therapy for evaluation and treatment for ***. ***   Patient reports onset of *** pain beginning ***. Pain is worse with ***.  Patient has deficits in *** ROM, *** flexibility; *** strength, ***abnormal posture, and TTP with abnormal muscle tension *** which are interfering with ADLs and are impacting quality of life.  On QuickDASH patient scored ***/100 demonstrating ***% disability.  [place] will benefit from skilled PT to address above deficits to improve mobility and activity tolerance with decreased pain interference.   OBJECTIVE IMPAIRMENTS: {opptimpairments:25111}.   ACTIVITY LIMITATIONS: {activitylimitations:27494}  PARTICIPATION LIMITATIONS: {participationrestrictions:25113}  PERSONAL FACTORS: {Personal factors:25162} are also affecting patient's functional outcome.   REHAB POTENTIAL: {rehabpotential:25112}  CLINICAL DECISION MAKING: {clinical decision making:25114}  EVALUATION COMPLEXITY: {Evaluation complexity:25115}   GOALS: Goals reviewed with patient? {yes/no:20286}  SHORT TERM GOALS: Target date: ***  Patient will be independent with initial HEP to improve outcomes and carryover.  Baseline: *** Goal status: {GOALSTATUS:25110}  2.  Patient will report 25% improvement in *** shoulder pain to improve QOL.   Baseline: *** Goal status: {GOALSTATUS:25110}  3.  *** Baseline: *** Goal status: {GOALSTATUS:25110}  LONG TERM GOALS: Target date: ***  Patient will be independent with  ongoing/advanced HEP for self-management at home.  Baseline: *** Goal status: {GOALSTATUS:25110}  2.  Patient will report 50-75% improvement in *** shoulder pain to improve QOL.  Baseline: *** Goal status: {GOALSTATUS:25110}  3.  Patient to demonstrate improved upright posture with posterior shoulder girdle engaged to promote improved glenohumeral joint mobility. Baseline: *** Goal status: {GOALSTATUS:25110}  4.  Patient to improve *** shoulder AROM to {Functional status:27472} without pain provocation to allow for increased ease of ADLs.  Baseline: *** Goal status: {GOALSTATUS:25110}  5.  Patient will demonstrate improved *** strength to >/= ***/5 for functional UE use. Baseline: *** Goal status: {GOALSTATUS:25110}  6  Patient will report </= ***% on QuickDASH (MCID = 10 pt) to demonstrate improved functional ability.  Baseline: *** Goal status: {GOALSTATUS:25110}  7.  Patient will ***   Baseline: *** Goal status: {GOALSTATUS:25110}   8. *** Baseline: *** Goal status: {GOALSTATUS:25110}   PLAN:  PT FREQUENCY: {rehab frequency:25116}  PT DURATION: {rehab duration:25117}  PLANNED INTERVENTIONS: {rehab planned interventions:25118::"97110-Therapeutic exercises","97530- Therapeutic [phone]- Neuromuscular re-education","97535- Self DGUY","40347- Manual therapy"}  PLAN FOR NEXT SESSION: ***   Milinda Pointer, PT 07/14/2023, 11:06 AM

## 2023-07-14 NOTE — Therapy (Signed)
OUTPATIENT PHYSICAL THERAPY SHOULDER EVALUATION   Patient Name: Tonya [name] MRN: [id number] DOB:August 10, 1950, 73 y.o., female Today's Date: 07/14/2023   END OF SESSION:  PT End of Session - 07/14/23 1109     Visit Number 1    Number of Visits 17    Date for PT Re-Evaluation 09/08/23    Authorization Type HTA    Authorization Time Period 07/14/23 to 09/08/23    Progress Note Due on Visit 10    PT Start Time 1107   pt few minutes late   PT Stop Time 1145    PT Time Calculation (min) 38 min    Activity Tolerance Patient tolerated treatment well    Behavior During Therapy [facility] for tasks assessed/performed             Past Medical History:  Diagnosis Date   Allergy June 2012   Seasonal   Asthma 2015   Cancer [facility]) 2010   Left Breast   Coronary atherosclerosis due to calcified coronary lesion    GERD (gastroesophageal reflux disease)    Many years ago   History of breast cancer    Hyperlipidemia    Hypertension    Osteopenia    Past Surgical History:  Procedure Laterality Date   ABDOMINAL HYSTERECTOMY  2015   AUGMENTATION MAMMAPLASTY Left    BREAST BIOPSY Right 02/2019   BREAST IMPLANT EXCHANGE     COLONOSCOPY     COSMETIC SURGERY  2010   Breast cancer related   FOOT SURGERY Left 05/25/2020   MASTECTOMY Left    REDUCTION MAMMAPLASTY Right    ROTATOR CUFF REPAIR Right 07/17/2022   TONSILLECTOMY AND ADENOIDECTOMY  1958   Patient Active Problem List   Diagnosis Date Noted   MCI (mild cognitive impairment) 05/31/2023   Alzheimer's disease, unspecified (CODE) (HCC) 05/31/2023   Persistent disorder of initiating or maintaining sleep 04/13/2023   Short-term memory loss 03/01/2023   History of breast cancer 03/01/2023   Acute bursitis of right shoulder 04/21/2022   Other bursal cyst, left ankle and foot 04/21/2022   Coronary artery disease due to lipid rich plaque 09/30/2021   Hepatic steatosis 09/30/2021   Adenoma of left adrenal gland 09/30/2021   Strain of  left trapezius muscle 09/30/2021   Hyperlipidemia 05/19/2021   Somatic dysfunction of spine, thoracic 03/26/2021   Chronic left-sided thoracic back pain 12/30/2020   DCIS (ductal carcinoma in situ) 01/26/2020   Low back pain 01/26/2020   Chronic pain of both knees 09/19/2019   Osteopenia    Estrogen deficiency 04/14/2018   Essential hypertension 01/10/2018   Gastroesophageal reflux disease 01/10/2018   Mild intermittent asthma without complication 01/10/2018   Pain in left lower leg 03/03/2017   Family history of colon cancer 02/23/2017   Herpes simplex vulvovaginitis 02/18/2017   Neuropathy 02/18/2017   Elevated liver enzymes 02/17/2017   Need for Streptococcus pneumoniae vaccination 02/17/2017   Chondromalacia of left patella 12/28/2016   Synovitis of left knee 12/28/2016   Seasonal allergic rhinitis due to fungal spores 09/25/2016   Bone mass 09/24/2016   Chronic hip pain 09/24/2016   Dark yellow-colored urine 09/24/2016   Low oxygen saturation 09/24/2016   Other specified postprocedural states 01/17/2015   Abnormal stress electrocardiogram test 10/20/2013   Breast mass 12/11/2010   Family history of malignant neoplasm of breast 12/11/2010    PCP: Sharlene Dory, DO   REFERRING PROVIDER: Sharlene Dory, DO  REFERRING DIAG: [phone] (ICD-10-CM) - Bilateral shoulder pain, unspecified  chronicity   THERAPY DIAG:  Chronic right shoulder pain - Plan: PT plan of care cert/re-cert  Chronic left shoulder pain - Plan: PT plan of care cert/re-cert  Muscle weakness (generalized) - Plan: PT plan of care cert/re-cert  Other low back pain - Plan: PT plan of care cert/re-cert  Other symptoms and signs involving the musculoskeletal system - Plan: PT plan of care cert/re-cert  RATIONALE FOR EVALUATION AND TREATMENT: Rehabilitation  ONSET DATE: chronic- had initial rotator cuff surgery January 2024  NEXT MD VISIT: Referring PRN    SUBJECTIVE:                                                                                                                                                                                                          SUBJECTIVE STATEMENT:  I'm having issues with both shoulders, R>L. Had R rotator cuff repair in the past (January 2024), that one hurts worse and now the left is hurting because I have started using my left arm more. Upper back is sore if I take a deep breath and if I shrug my shoulders. Feels like my right shoulder blade is "out". Muscle directly below my shoulder blade can go numb sometimes. Both arms ache at times all the way down to elbows and sometimes forearms. Feel very weak. Having some issues with my legs as well, I hurt everywhere in my body. When I try to reach across myself I get a bad pinch in my shoulder.   PAIN: Are you having pain? Yes: NPRS scale: 5-6/10  Pain location: B shoulders  Pain description: sharp and sore  Aggravating factors: movement in general  Relieving factors: not moving, ice, heat   PERTINENT HISTORY:  R RCR?, osteopenia, L breast cancer, HTN, CAD, Alzheimer's dementia, LBP, sciatica, neuropathy, fatigue  PRECAUTIONS: None  RED FLAGS: None  HAND DOMINANCE: Right  WEIGHT BEARING RESTRICTIONS: No  FALLS:  Has patient fallen in last 6 months? 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 Severe tendinosis of the supraspinatus tendon with a large full-thickness, near complete, tear with 3.8 cm of  retraction and a few intact anterior  most fibers. 2. Moderate tendinosis of the infraspinatus tendon with a tiny interstitial tear. 3. Mild tendinosis of the intra-articular portion of the long head of the biceps tendon. 04/21/22 - DG R shoulder: IMPRESSION: No fracture or dislocation of the right shoulder. Mild acromioclavicular and glenohumeral arthrosis.  PATIENT SURVEYS:  Quick Dash 59.1%  COGNITION: Overall cognitive status: History of cognitive impairments - at baseline mild cognitive impairment at baseline per chart        POSTURE: rounded shoulders, forward head, and increased thoracic kyphosis  UPPER EXTREMITY ROM:   Active ROM Right eval Left eval  Shoulder flexion 145* 160*  Shoulder extension    Shoulder abduction 145* 170*  Shoulder adduction    Shoulder internal rotation L4  T7  Shoulder external rotation C4 C7  Elbow flexion    Elbow extension    Wrist flexion    Wrist extension    Wrist ulnar deviation    Wrist radial deviation    Wrist pronation    Wrist supination    (Blank rows = not tested)  UPPER EXTREMITY MMT:  MMT Right eval Left eval  Shoulder flexion 3+ 4  Shoulder extension    Shoulder abduction 3 4  Shoulder adduction    Shoulder internal rotation 4 4  Shoulder external rotation 3 4  Middle trapezius    Lower trapezius    Elbow flexion    Elbow extension    Wrist flexion    Wrist extension    Wrist ulnar deviation    Wrist radial deviation    Wrist pronation    Wrist supination    Grip strength (lbs)    Hip flexion  5 5  Quads  5 5  Hamstrings  4+ 4+  Ankle dorsiflexion  5 5  (Blank rows = not tested)    PALPATION:  Extremely tight in all paraspinals, upper traps, R anterior shoulder and biceps, suboccipitals     TODAY'S TREATMENT:   07/14/23  Exam, care planning, answered all questions/concerns this session- ran out of time, will assign HEP 2nd visit    PATIENT EDUCATION:  Education details: PT eval findings and  anticipated POC , education on deconditioning, PT POC, positioning for shoulder, role of PT and exercise in addressing concerns and ultimately reducing pain  Person educated: Patient Education method: Explanation Education comprehension: verbalized understanding and needs further education  HOME EXERCISE PROGRAM: 2nd session    ASSESSMENT:  CLINICAL IMPRESSION: AAISHAH INZER is a 73 y.o. female who was referred to physical therapy for evaluation and treatment for  Diagnosis  M25.511,M25.512 (ICD-10-CM) - Bilateral shoulder pain, unspecified chronicity  Exam with objective findings as above, she is also having issues with fatigue and low energy levels along with her shoulder pain. Not sleeping well due to pain. Very talkative during eval, so measures were a bit limited but based on her report of symptoms and measures we were able to take today, strength and general deconditioning are major elements leading to her pain. Sounds like she is having some cervicalgia from her shoulders as well. Will make every effort to address pain and improve QOL.   OBJECTIVE IMPAIRMENTS: Abnormal gait, decreased activity tolerance, decreased balance, decreased mobility, difficulty walking, decreased ROM, decreased strength, increased fascial restrictions, impaired perceived functional ability, increased muscle spasms, impaired UE functional use, postural dysfunction, and pain.   ACTIVITY LIMITATIONS: carrying, lifting, standing, squatting, stairs, transfers, reach over head, locomotion level, and caring for others  PARTICIPATION LIMITATIONS: meal prep, cleaning, laundry,  driving, shopping, community activity, and yard work  PERSONAL FACTORS: Age, Behavior pattern, Education, Fitness, Past/current experiences, Social background, and Time since onset of injury/illness/exacerbation are also affecting patient's functional outcome.   REHAB POTENTIAL: Fair ongoing issues with pain since surgery January 2024, gross  deconditioning, not compliant with HEP since DC from PT   CLINICAL DECISION MAKING: Stable/uncomplicated  EVALUATION COMPLEXITY: Low   GOALS: Goals reviewed with patient? Yes  SHORT TERM GOALS: Target date: 08/11/2023    Patient will be independent with initial HEP to improve outcomes and carryover.  Baseline:  Goal status: INITIAL  2.  Patient will report 25% improvement in B shoulder pain to improve QOL.   Baseline:  Goal status: INITIAL  3. Will be able to dress/perform all self care activities without increased pain in neck and shoulders Baseline: Goal status: INITIAL     LONG TERM GOALS: Target date: 09/08/2023    Patient will be independent with ongoing/advanced HEP for self-management at home.  Baseline:  Goal status: INITIAL  2.  Patient will report 50-75% improvement in B shoulder pain to improve QOL.  Baseline:  Goal status: INITIAL  3.  Patient to demonstrate improved upright posture with posterior shoulder girdle engaged to promote improved glenohumeral joint mobility. Baseline:  Goal status: INITIAL  4.  R shoulder AROM to be equal to L without pain provocation to allow for increased ease of ADLs.  Baseline:  Goal status: INITIAL  5.  MMT will have improved by at least one grade in all weak groups  Goal status: INITIAL  6  QuickDASH to have improved by at least 15 points to show improved QOL/subjective improvement  Baseline:  Goal status: INITIAL  7.  Patient will experience at least a 50% improvement in sciatica and low back discomfort    Baseline:  Goal status: INITIAL   8. Will be able to perform exercise routine as desired without increase in pain in shoulders/back/LEs  Baseline:  Goal status: INITIAL   PLAN:  PT FREQUENCY: 1-2x/week  PT DURATION: 8 weeks  PLANNED INTERVENTIONS: 97164- PT Re-evaluation, 97110-Therapeutic exercises, 97530- Therapeutic activity, [id number]- Neuromuscular re-education, 97535- Self Care, 28413- Manual therapy,  [id number]- Aquatic Therapy, 97014- Electrical stimulation (unattended), 97033- Ionotophoresis 4mg /ml Dexamethasone, Taping, Dry Needling, Cryotherapy, and Moist heat  PLAN FOR NEXT SESSION: Does not want dry needling. Might benefit from water PT if progress is slow in land PT and she is agreeable, has been to [facility] clinic before. Needs HEP. Shoulder ROM and strength/postural strength, proximal and core strength and LE stretching as appropriate, general conditioning    Nedra Hai, PT, DPT 07/14/23 11:55 AM

## 2023-07-14 NOTE — Telephone Encounter (Signed)
Pt spouse is asking for a call from Dr Vickey Huger to discuss pt's scores and how that affects her being on Leqembi, please call him at [phone]

## 2023-07-15 ENCOUNTER — Encounter: Payer: Self-pay | Admitting: Cardiology

## 2023-07-15 ENCOUNTER — Other Ambulatory Visit: Payer: Self-pay

## 2023-07-15 DIAGNOSIS — I10 Essential (primary) hypertension: Secondary | ICD-10-CM

## 2023-07-15 NOTE — Telephone Encounter (Signed)
LVM made husband aware pts paperwork has been submitted and we are waiting on approval . Made husband aware [facility] will give him a call back when they receive answer .

## 2023-07-15 NOTE — Telephone Encounter (Signed)
Agree w/ holding Zetia and Lovastatin 10mg  po at bedtime.   Check CMP and Total CK level.   Once she is back to baseline - just start back on Lovastatin (as she had tolerated that well in the past).   Tamey Wanek [place], DO, [facility]

## 2023-07-15 NOTE — Progress Notes (Signed)
Order for total CK and CMP have been placed and released.

## 2023-07-16 DIAGNOSIS — I10 Essential (primary) hypertension: Secondary | ICD-10-CM | POA: Diagnosis not present

## 2023-07-17 LAB — CK TOTAL AND CKMB (NOT AT ARMC)
CK-MB Index: 2.1 ng/mL (ref 0.0–5.3)
Total CK: 60 U/L (ref 32–182)

## 2023-07-17 LAB — COMPREHENSIVE METABOLIC PANEL
ALT: 18 [IU]/L (ref 0–32)
AST: 24 [IU]/L (ref 0–40)
Albumin: 4.5 g/dL (ref 3.8–4.8)
Alkaline Phosphatase: 88 [IU]/L (ref 44–121)
BUN/Creatinine Ratio: 20 (ref 12–28)
BUN: 16 mg/dL (ref 8–27)
Bilirubin Total: 0.4 mg/dL (ref 0.0–1.2)
CO2: 24 mmol/L (ref 20–29)
Calcium: 9.6 mg/dL (ref 8.7–10.3)
Chloride: 104 mmol/L (ref 96–106)
Creatinine, Ser: 0.82 mg/dL (ref 0.57–1.00)
Globulin, Total: 2.3 g/dL (ref 1.5–4.5)
Glucose: 84 mg/dL (ref 70–99)
Potassium: 4.3 mmol/L (ref 3.5–5.2)
Sodium: 143 mmol/L (ref 134–144)
Total Protein: 6.8 g/dL (ref 6.0–8.5)
eGFR: 76 mL/min/{1.73_m2} (ref >59)

## 2023-07-19 ENCOUNTER — Ambulatory Visit: Payer: PPO | Admitting: Physical Therapy

## 2023-07-19 ENCOUNTER — Telehealth: Payer: Self-pay | Admitting: Family Medicine

## 2023-07-19 ENCOUNTER — Encounter: Payer: Self-pay | Admitting: Physical Therapy

## 2023-07-19 DIAGNOSIS — R29898 Other symptoms and signs involving the musculoskeletal system: Secondary | ICD-10-CM

## 2023-07-19 DIAGNOSIS — M5459 Other low back pain: Secondary | ICD-10-CM

## 2023-07-19 DIAGNOSIS — G8929 Other chronic pain: Secondary | ICD-10-CM

## 2023-07-19 DIAGNOSIS — M25511 Pain in right shoulder: Secondary | ICD-10-CM | POA: Diagnosis not present

## 2023-07-19 DIAGNOSIS — M6281 Muscle weakness (generalized): Secondary | ICD-10-CM

## 2023-07-19 NOTE — Therapy (Signed)
OUTPATIENT PHYSICAL THERAPY TREATMENT   Patient Name: Tonya [name] MRN: [id number] DOB:[birth year], 73 y.o., female Today's Date: 07/19/2023   END OF SESSION:  PT End of Session - 07/19/23 1447     Visit Number 2    Date for PT Re-Evaluation 09/08/23    Authorization Type HTA    Authorization Time Period 07/14/23 to 09/08/23    Progress Note Due on Visit 10    PT Start Time 1447    PT Stop Time 1540    PT Time Calculation (min) 53 min    Activity Tolerance Patient tolerated treatment well    Behavior During Therapy [facility] for tasks assessed/performed              Past Medical History:  Diagnosis Date   Allergy June 2012   Seasonal   Asthma 2015   Cancer [facility]) 2010   Left Breast   Coronary atherosclerosis due to calcified coronary lesion    GERD (gastroesophageal reflux disease)    Many years ago   History of breast cancer    Hyperlipidemia    Hypertension    Osteopenia    Past Surgical History:  Procedure Laterality Date   ABDOMINAL HYSTERECTOMY  2015   AUGMENTATION MAMMAPLASTY Left    BREAST BIOPSY Right 02/2019   BREAST IMPLANT EXCHANGE     COLONOSCOPY     COSMETIC SURGERY  2010   Breast cancer related   FOOT SURGERY Left 05/25/2020   MASTECTOMY Left    REDUCTION MAMMAPLASTY Right    ROTATOR CUFF REPAIR Right 07/17/2022   TONSILLECTOMY AND ADENOIDECTOMY  1958   Patient Active Problem List   Diagnosis Date Noted   MCI (mild cognitive impairment) 05/31/2023   Alzheimer's disease, unspecified (CODE) (HCC) 05/31/2023   Persistent disorder of initiating or maintaining sleep 04/13/2023   Short-term memory loss 03/01/2023   History of breast cancer 03/01/2023   Acute bursitis of right shoulder 04/21/2022   Other bursal cyst, left ankle and foot 04/21/2022   Coronary artery disease due to lipid rich plaque 09/30/2021   Hepatic steatosis 09/30/2021   Adenoma of left adrenal gland 09/30/2021   Strain of left trapezius muscle 09/30/2021   Hyperlipidemia  05/19/2021   Somatic dysfunction of spine, thoracic 03/26/2021   Chronic left-sided thoracic back pain 12/30/2020   DCIS (ductal carcinoma in situ) 01/26/2020   Low back pain 01/26/2020   Chronic pain of both knees 09/19/2019   Osteopenia    Estrogen deficiency 04/14/2018   Essential hypertension 01/10/2018   Gastroesophageal reflux disease 01/10/2018   Mild intermittent asthma without complication 01/10/2018   Pain in left lower leg 03/03/2017   Family history of colon cancer 02/23/2017   Herpes simplex vulvovaginitis 02/18/2017   Neuropathy 02/18/2017   Elevated liver enzymes 02/17/2017   Need for Streptococcus pneumoniae vaccination 02/17/2017   Chondromalacia of left patella 12/28/2016   Synovitis of left knee 12/28/2016   Seasonal allergic rhinitis due to fungal spores 09/25/2016   Bone mass 09/24/2016   Chronic hip pain 09/24/2016   Dark yellow-colored urine 09/24/2016   Low oxygen saturation 09/24/2016   Other specified postprocedural states 01/17/2015   Abnormal stress electrocardiogram test 10/20/2013   Breast mass 12/11/2010   Family history of malignant neoplasm of breast 12/11/2010    PCP: Sharlene Dory, DO   REFERRING PROVIDER: Sharlene Dory, DO  REFERRING DIAG: [phone] (ICD-10-CM) - Bilateral shoulder pain, unspecified chronicity   THERAPY DIAG:  Chronic right shoulder pain  Chronic  left shoulder pain  Muscle weakness (generalized)  Other low back pain  Other symptoms and signs involving the musculoskeletal system  RATIONALE FOR EVALUATION AND TREATMENT: Rehabilitation  ONSET DATE: chronic- had initial rotator cuff surgery January 2024  NEXT MD VISIT: Referring PRN    SUBJECTIVE:                                                                                                                                                                                                         SUBJECTIVE STATEMENT: Pt reports she has  pain everyday but not too bad today.  R shoulder has been bad since her RCR on 07/20/22.  EVAL:  I'm having issues with both shoulders, R>L. Had R rotator cuff repair in the past (January 2024), that one hurts worse and now the left is hurting because I have started using my left arm more. Upper back is sore if I take a deep breath and if I shrug my shoulders. Feels like my right shoulder blade is "out". Muscle directly below my shoulder blade can go numb sometimes. Both arms ache at times all the way down to elbows and sometimes forearms. Feel very weak. Having some issues with my legs as well, I hurt everywhere in my body. When I try to reach across myself I get a bad pinch in my shoulder.   PAIN: Are you having pain? Yes: NPRS scale: 4/10  Pain location: B shoulders  Pain description: sharp and sore  Aggravating factors: movement in general  Relieving factors: not moving, ice, heat   PERTINENT HISTORY:  R RCR 07/20/22, osteopenia, L breast cancer, HTN, CAD, Alzheimer's dementia, LBP, sciatica, neuropathy, fatigue  PRECAUTIONS: None  RED FLAGS: None  HAND DOMINANCE: Right  WEIGHT BEARING RESTRICTIONS: No  FALLS:  Has patient fallen in last 6 months? No  LIVING ENVIRONMENT: Lives with: lives with their spouse Lives in: House/apartment  OCCUPATION: retired - used to work in Automotive engineer  PLOF: Independent, Independent with basic ADLs, Independent with gait, and Independent with transfers  PATIENT GOALS:  address pain, be able to do more, be able to exercise to lose weight/tolerate exercise better    OBJECTIVE: (objective measures completed at initial evaluation unless otherwise dated)  DIAGNOSTIC FINDINGS:  06/11/22 - R shoulder MRI: IMPRESSION: 1. Severe tendinosis of the supraspinatus tendon with a large full-thickness, near complete, tear with 3.8 cm of retraction and a few intact anterior most fibers. 2. Moderate tendinosis of the infraspinatus tendon with a tiny  interstitial tear. 3. Mild tendinosis of the intra-articular portion of  the long head of the biceps tendon. 04/21/22 - DG R shoulder: IMPRESSION: No fracture or dislocation of the right shoulder. Mild acromioclavicular and glenohumeral arthrosis.  PATIENT SURVEYS:  Quick Dash 59.1%  COGNITION: Overall cognitive status: History of cognitive impairments - at baseline mild cognitive impairment at baseline per chart    POSTURE: rounded shoulders, forward head, and increased thoracic kyphosis  UPPER EXTREMITY ROM:   Active ROM Right eval Left eval  Shoulder flexion 145 160  Shoulder extension    Shoulder abduction 145 170  Shoulder adduction    Shoulder internal rotation L4  T7  Shoulder external rotation C4 C7  Elbow flexion    Elbow extension    Wrist flexion    Wrist extension    Wrist ulnar deviation    Wrist radial deviation    Wrist pronation    Wrist supination    (Blank rows = not tested)  UPPER EXTREMITY MMT:  MMT Right eval Left eval  Shoulder flexion 3+ 4  Shoulder extension    Shoulder abduction 3 4  Shoulder adduction    Shoulder internal rotation 4 4  Shoulder external rotation 3 4  Middle trapezius    Lower trapezius    Elbow flexion    Elbow extension    Wrist flexion    Wrist extension    Wrist ulnar deviation    Wrist radial deviation    Wrist pronation    Wrist supination    Grip strength (lbs)    Hip flexion  5 5  Quads  5 5  Hamstrings  4+ 4+  Ankle dorsiflexion  5 5  (Blank rows = not tested)  PALPATION:  Extremely tight in all paraspinals, upper traps, R anterior shoulder and biceps, suboccipitals     TODAY'S TREATMENT:   07/19/23 THERAPEUTIC EXERCISE: to improve flexibility, strength and mobility.  Demonstration, verbal and tactile cues throughout for technique.  UBE - L1.0 x 6 min (3' fwd & back) - pt reports numbness under her shoulder blade building during warm-up Seated gentle UT stretch 2 x 30" bil Seated gentle LS stretch 2  x 30" bil Seated scapular retraction 5 x 5" - PT providing tactile guidance however patient tending to want to shrug shoulders Standing YTB scap retraction + shoulder row 10 x 3" - cues to drop elbows towards floor while squeezing shoulder blades together Standing YTB scap retraction + shoulder extension 10 x 3"   MANUAL THERAPY: To promote normalized muscle tension, improved flexibility, improved joint mobility, increased ROM, and reduced pain. STM/DTM and manual TPR to R UT, LS, rhomboids, subscapularis, infraspinatus, teres group, and subscapularis Scapular mobilization in S/L    07/14/23 Exam, care planning, answered all questions/concerns this session- ran out of time, will assign HEP 2nd visit    PATIENT EDUCATION:  Education details: initial HEP and postural awareness  Person educated: Patient Education method: Explanation, Demonstration, Tactile cues, Verbal cues, and Handouts Education comprehension: verbalized understanding, returned demonstration, verbal cues required, tactile cues required, and needs further education  HOME EXERCISE PROGRAM: Access Code: [email or web address] URL: [email or web address] Date: 07/19/2023 Prepared by: Glenetta Hew  Exercises - Seated Gentle Upper Trapezius Stretch  - 2 x daily - 7 x weekly - 3 reps - 30 sec hold - Gentle Levator Scapulae Stretch (Mirrored)  - 2 x daily - 7 x weekly - 3 reps - 30 sec hold - Standing Bilateral Low Shoulder Row with Anchored Resistance  - 1 x daily - 7 x weekly - 2  sets - 10 reps - 5 sec hold - Scapular Retraction with Resistance Advanced  - 1 x daily - 7 x weekly - 2 sets - 10 reps - 5 sec hold   ASSESSMENT:  CLINICAL IMPRESSION: Tonya [name] reports numbness under her R shoulder blade following warm up on UBE.  Increased muscle tension evident in majority of R periscapular muscles as well as R anterior shoulder and upper arm.  Several TPs identified which were addressed with manual STM/DTM and TPR as patient not  interested in TPDN as she felt this was too uncomfortable and not very beneficial when performed during prior PT episode.  Initiated gentle stretching and postural strengthening with repeated cues necessary to avoid shoulder hike as well for for overall postural awareness.  Tonya [name] will benefit from continued skilled PT to address ongoing abnormal muscle tension, ROM and strength deficits to improve mobility and activity tolerance with decreased pain interference.     OBJECTIVE IMPAIRMENTS: Abnormal gait, decreased activity tolerance, decreased balance, decreased mobility, difficulty walking, decreased ROM, decreased strength, increased fascial restrictions, impaired perceived functional ability, increased muscle spasms, impaired UE functional use, postural dysfunction, and pain.   ACTIVITY LIMITATIONS: carrying, lifting, standing, squatting, stairs, transfers, reach over head, locomotion level, and caring for others  PARTICIPATION LIMITATIONS: meal prep, cleaning, laundry, driving, shopping, community activity, and yard work  PERSONAL FACTORS: Age, Behavior pattern, Education, Fitness, Past/current experiences, Social background, and Time since onset of injury/illness/exacerbation are also affecting patient's functional outcome.   REHAB POTENTIAL: Fair ongoing issues with pain since surgery January 2024, gross deconditioning, not compliant with HEP since DC from PT   CLINICAL DECISION MAKING: Stable/uncomplicated  EVALUATION COMPLEXITY: Low   GOALS: Goals reviewed with patient? Yes  SHORT TERM GOALS: Target date: 08/11/2023    Patient will be independent with initial HEP to improve outcomes and carryover.  Baseline:  Goal status: IN PROGRESS  2.  Patient will report 25% improvement in B shoulder pain to improve QOL.   Baseline:  Goal status: IN PROGRESS  3.  Will be able to dress/perform all self care activities without increased pain in neck and shoulders. Baseline:  Goal status: IN  PROGRESS   LONG TERM GOALS: Target date: 09/08/2023   Patient will be independent with ongoing/advanced HEP for self-management at home.  Baseline:  Goal status: IN PROGRESS  2.  Patient will report 50-75% improvement in B shoulder pain to improve QOL.  Baseline:  Goal status: IN PROGRESS  3.  Patient to demonstrate improved upright posture with posterior shoulder girdle engaged to promote improved glenohumeral joint mobility. Baseline:  Goal status: IN PROGRESS  4.  R shoulder AROM to be equal to L without pain provocation to allow for increased ease of ADLs.  Baseline:  Goal status: IN PROGRESS  5.  MMT will have improved by at least one grade in all weak groups  Goal status: IN PROGRESS  6  QuickDASH to have improved by at least 15 points to show improved QOL/subjective improvement  Baseline: 59.1% Goal status: IN PROGRESS  7.  Patient will experience at least a 50% improvement in sciatica and low back discomfort    Baseline:  Goal status: IN PROGRESS   8. Will be able to perform exercise routine as desired without increase in pain in shoulders/back/LEs  Baseline:  Goal status: IN PROGRESS   PLAN:  PT FREQUENCY: 1-2x/week  PT DURATION: 8 weeks  PLANNED INTERVENTIONS: 97164- PT Re-evaluation, 97110-Therapeutic exercises, 97530- Therapeutic activity, [id number]- Neuromuscular re-education, 97535-  Self Care, 16109- Manual therapy, [id number]- Aquatic Therapy, 97014- Electrical stimulation (unattended), [phone]- Ionotophoresis 4mg /ml Dexamethasone, Taping, Dry Needling, Cryotherapy, and Moist heat  PLAN FOR NEXT SESSION: Review initial HEP as well as post RCR HEP if patient brings this with her, updating HEP accordingly.  Shoulder ROM and strength/postural strength, proximal and core strength and LE stretching as appropriate, general conditioning. Does not want dry needling.  Might benefit from water PT if progress is slow in land PT and she is agreeable, has been to [facility] clinic before.     Marry Guan, PT  07/19/23 4:05 PM

## 2023-07-19 NOTE — Telephone Encounter (Signed)
Pt dropped off Copy of power of attorney for provider to see and have on pt's chart. Document put at front office tray under providers name.

## 2023-07-20 NOTE — Telephone Encounter (Signed)
Pt is asking for a call from Dr Vickey Huger regarding her now being told she does not qualify for the Leqembi.  She is wanting to discuss the testing she was told could be redone to possibly qualify her for this treatment.

## 2023-07-20 NOTE — Telephone Encounter (Signed)
Paperwork will be placed in bin for provider to review.

## 2023-07-20 NOTE — Telephone Encounter (Signed)
Spoke to pt made her appointment 08/10/2023 at 815am with Jessica,NP to retake [facility] for laquembi

## 2023-07-26 ENCOUNTER — Ambulatory Visit: Payer: PPO | Admitting: Physical Therapy

## 2023-07-27 ENCOUNTER — Telehealth: Payer: Self-pay | Admitting: Neurology

## 2023-07-27 NOTE — Telephone Encounter (Signed)
Pt said received a notification there was a earlier appt on 08/02/23. Checked for the patient and appt had already been scheduled with another patient.

## 2023-07-31 ENCOUNTER — Other Ambulatory Visit: Payer: Self-pay | Admitting: Family Medicine

## 2023-08-02 ENCOUNTER — Other Ambulatory Visit: Payer: Self-pay | Admitting: Family Medicine

## 2023-08-02 ENCOUNTER — Ambulatory Visit: Payer: PPO | Attending: Family Medicine

## 2023-08-02 DIAGNOSIS — M5459 Other low back pain: Secondary | ICD-10-CM | POA: Diagnosis not present

## 2023-08-02 DIAGNOSIS — G8929 Other chronic pain: Secondary | ICD-10-CM | POA: Diagnosis not present

## 2023-08-02 DIAGNOSIS — M6281 Muscle weakness (generalized): Secondary | ICD-10-CM | POA: Insufficient documentation

## 2023-08-02 DIAGNOSIS — M25512 Pain in left shoulder: Secondary | ICD-10-CM | POA: Insufficient documentation

## 2023-08-02 DIAGNOSIS — R29898 Other symptoms and signs involving the musculoskeletal system: Secondary | ICD-10-CM | POA: Diagnosis not present

## 2023-08-02 DIAGNOSIS — M25511 Pain in right shoulder: Secondary | ICD-10-CM | POA: Diagnosis not present

## 2023-08-02 NOTE — Therapy (Signed)
 OUTPATIENT PHYSICAL THERAPY TREATMENT   Patient Name: Tonya [name] MRN: [id number] DOB:07/30/1950, 73 y.o., female Today's Date: 08/02/2023   END OF SESSION:  PT End of Session - 08/02/23 1502     Visit Number 3    Date for PT Re-Evaluation 09/08/23    Authorization Type HTA    Authorization Time Period 07/14/23 to 09/08/23    Progress Note Due on Visit 10    PT Start Time 1456    PT Stop Time 1534    PT Time Calculation (min) 38 min    Activity Tolerance Patient tolerated treatment well    Behavior During Therapy [facility] for tasks assessed/performed               Past Medical History:  Diagnosis Date   Allergy June 2012   Seasonal   Asthma 2015   Cancer [facility]) 2010   Left Breast   Coronary atherosclerosis due to calcified coronary lesion    GERD (gastroesophageal reflux disease)    Many years ago   History of breast cancer    Hyperlipidemia    Hypertension    Osteopenia    Past Surgical History:  Procedure Laterality Date   ABDOMINAL HYSTERECTOMY  2015   AUGMENTATION MAMMAPLASTY Left    BREAST BIOPSY Right 02/2019   BREAST IMPLANT EXCHANGE     COLONOSCOPY     COSMETIC SURGERY  2010   Breast cancer related   FOOT SURGERY Left 05/25/2020   MASTECTOMY Left    REDUCTION MAMMAPLASTY Right    ROTATOR CUFF REPAIR Right 07/17/2022   TONSILLECTOMY AND ADENOIDECTOMY  1958   Patient Active Problem List   Diagnosis Date Noted   MCI (mild cognitive impairment) 05/31/2023   Alzheimer's disease, unspecified (CODE) (HCC) 05/31/2023   Persistent disorder of initiating or maintaining sleep 04/13/2023   Short-term memory loss 03/01/2023   History of breast cancer 03/01/2023   Acute bursitis of right shoulder 04/21/2022   Other bursal cyst, left ankle and foot 04/21/2022   Coronary artery disease due to lipid rich plaque 09/30/2021   Hepatic steatosis 09/30/2021   Adenoma of left adrenal gland 09/30/2021   Strain of left trapezius muscle 09/30/2021   Hyperlipidemia  05/19/2021   Somatic dysfunction of spine, thoracic 03/26/2021   Chronic left-sided thoracic back pain 12/30/2020   DCIS (ductal carcinoma in situ) 01/26/2020   Low back pain 01/26/2020   Chronic pain of both knees 09/19/2019   Osteopenia    Estrogen deficiency 04/14/2018   Essential hypertension 01/10/2018   Gastroesophageal reflux disease 01/10/2018   Mild intermittent asthma without complication 01/10/2018   Pain in left lower leg 03/03/2017   Family history of colon cancer 02/23/2017   Herpes simplex vulvovaginitis 02/18/2017   Neuropathy 02/18/2017   Elevated liver enzymes 02/17/2017   Need for Streptococcus pneumoniae vaccination 02/17/2017   Chondromalacia of left patella 12/28/2016   Synovitis of left knee 12/28/2016   Seasonal allergic rhinitis due to fungal spores 09/25/2016   Bone mass 09/24/2016   Chronic hip pain 09/24/2016   Dark yellow-colored urine 09/24/2016   Low oxygen saturation 09/24/2016   Other specified postprocedural states 01/17/2015   Abnormal stress electrocardiogram test 10/20/2013   Breast mass 12/11/2010   Family history of malignant neoplasm of breast 12/11/2010    PCP: Jobe Mulder, DO   REFERRING PROVIDER: Jobe Mulder, DO  REFERRING DIAG: [phone] (ICD-10-CM) - Bilateral shoulder pain, unspecified chronicity   THERAPY DIAG:  Chronic right shoulder pain  Chronic left shoulder pain  Muscle weakness (generalized)  Other low back pain  Other symptoms and signs involving the musculoskeletal system  RATIONALE FOR EVALUATION AND TREATMENT: Rehabilitation  ONSET DATE: chronic- had initial rotator cuff surgery January 2024  NEXT MD VISIT: Referring PRN    SUBJECTIVE:                                                                                                                                                                                                         SUBJECTIVE STATEMENT: Pt reports pain in R  shoulder  EVAL:  I'm having issues with both shoulders, R>L. Had R rotator cuff repair in the past (January 2024), that one hurts worse and now the left is hurting because I have started using my left arm more. Upper back is sore if I take a deep breath and if I shrug my shoulders. Feels like my right shoulder blade is "out". Muscle directly below my shoulder blade can go numb sometimes. Both arms ache at times all the way down to elbows and sometimes forearms. Feel very weak. Having some issues with my legs as well, I hurt everywhere in my body. When I try to reach across myself I get a bad pinch in my shoulder.   PAIN: Are you having pain? Yes: NPRS scale: 7/10  Pain location: R shoulder  Pain description: sharp and sore  Aggravating factors: movement in general  Relieving factors: not moving, ice, heat   PERTINENT HISTORY:  R RCR 07/20/22, osteopenia, L breast cancer, HTN, CAD, Alzheimer's dementia, LBP, sciatica, neuropathy, fatigue  PRECAUTIONS: None  RED FLAGS: None  HAND DOMINANCE: Right  WEIGHT BEARING RESTRICTIONS: No  FALLS:  Has patient fallen in last 6 months? No  LIVING ENVIRONMENT: Lives with: lives with their spouse Lives in: House/apartment  OCCUPATION: retired - used to work in Automotive engineer  PLOF: Independent, Independent with basic ADLs, Independent with gait, and Independent with transfers  PATIENT GOALS:  address pain, be able to do more, be able to exercise to lose weight/tolerate exercise better    OBJECTIVE: (objective measures completed at initial evaluation unless otherwise dated)  DIAGNOSTIC FINDINGS:  06/11/22 - R shoulder MRI: IMPRESSION: 1. Severe tendinosis of the supraspinatus tendon with a large full-thickness, near complete, tear with 3.8 cm of retraction and a few intact anterior most fibers. 2. Moderate tendinosis of the infraspinatus tendon with a tiny interstitial tear. 3. Mild tendinosis of the intra-articular portion of the  long head of the biceps tendon. 04/21/22 - DG R shoulder: IMPRESSION: No fracture  or dislocation of the right shoulder. Mild acromioclavicular and glenohumeral arthrosis.  PATIENT SURVEYS:  Quick Dash 59.1%  COGNITION: Overall cognitive status: History of cognitive impairments - at baseline mild cognitive impairment at baseline per chart    POSTURE: rounded shoulders, forward head, and increased thoracic kyphosis  UPPER EXTREMITY ROM:   Active ROM Right eval Left eval  Shoulder flexion 145 160  Shoulder extension    Shoulder abduction 145 170  Shoulder adduction    Shoulder internal rotation L4  T7  Shoulder external rotation C4 C7  Elbow flexion    Elbow extension    Wrist flexion    Wrist extension    Wrist ulnar deviation    Wrist radial deviation    Wrist pronation    Wrist supination    (Blank rows = not tested)  UPPER EXTREMITY MMT:  MMT Right eval Left eval  Shoulder flexion 3+ 4  Shoulder extension    Shoulder abduction 3 4  Shoulder adduction    Shoulder internal rotation 4 4  Shoulder external rotation 3 4  Middle trapezius    Lower trapezius    Elbow flexion    Elbow extension    Wrist flexion    Wrist extension    Wrist ulnar deviation    Wrist radial deviation    Wrist pronation    Wrist supination    Grip strength (lbs)    Hip flexion  5 5  Quads  5 5  Hamstrings  4+ 4+  Ankle dorsiflexion  5 5  (Blank rows = not tested)  PALPATION:  Extremely tight in all paraspinals, upper traps, R anterior shoulder and biceps, suboccipitals     TODAY'S TREATMENT:  08/02/23 THERAPEUTIC EXERCISE: to improve flexibility, strength and mobility.  Demonstration, verbal and tactile cues throughout for technique.  UBE - L1.0 x 5 min  Standing row YTB 10x3"; 2 sets Standing shoulder ext YTB 10x3"; 2 sets Seated chin tucks 10x3" Seated thoracic extension 10x3"  MANUAL THERAPY: To promote normalized muscle tension, improved flexibility, improved joint  mobility, increased ROM, and reduced pain. STM/DTM and manual TPR to R UT, LS, rhomboids, subscapularis, infraspinatus, teres group  07/19/23 THERAPEUTIC EXERCISE: to improve flexibility, strength and mobility.  Demonstration, verbal and tactile cues throughout for technique.  UBE - L1.0 x 6 min (3' fwd & back) - pt reports numbness under her shoulder blade building during warm-up Seated gentle UT stretch 2 x 30" bil Seated gentle LS stretch 2 x 30" bil Seated scapular retraction 5 x 5" - PT providing tactile guidance however patient tending to want to shrug shoulders Standing YTB scap retraction + shoulder row 10 x 3" - cues to drop elbows towards floor while squeezing shoulder blades together Standing YTB scap retraction + shoulder extension 10 x 3"   MANUAL THERAPY: To promote normalized muscle tension, improved flexibility, improved joint mobility, increased ROM, and reduced pain. STM/DTM and manual TPR to R UT, LS, rhomboids, subscapularis, infraspinatus, teres group, and subscapularis Scapular mobilization in S/L    07/14/23 Exam, care planning, answered all questions/concerns this session- ran out of time, will assign HEP 2nd visit    PATIENT EDUCATION:  Education details: HEP update  Person educated: Patient Education method: Explanation, Demonstration, Tactile cues, Verbal cues, and Handouts Education comprehension: verbalized understanding, returned demonstration, verbal cues required, tactile cues required, and needs further education  HOME EXERCISE PROGRAM: Access Code: [email or web address] URL: [email or web address] Date: 08/02/2023 Prepared by: Amarria Andreasen  Exercises - Seated Gentle Upper  Trapezius Stretch  - 2 x daily - 7 x weekly - 3 reps - 30 sec hold - Gentle Levator Scapulae Stretch (Mirrored)  - 2 x daily - 7 x weekly - 3 reps - 30 sec hold - Standing Bilateral Low Shoulder Row with Anchored Resistance  - 1 x daily - 7 x weekly - 2 sets - 10 reps - 5 sec  hold - Scapular Retraction with Resistance Advanced  - 1 x daily - 7 x weekly - 2 sets - 10 reps - 5 sec hold - Seated Thoracic Lumbar Extension  - 1 x daily - 7 x weekly - 2 sets - 10 reps - Seated Cervical Retraction  - 1 x daily - 7 x weekly - 2 sets - 10 reps   ASSESSMENT:  CLINICAL IMPRESSION: Pt still presents with tightness along her R scapula and cervical area. She was very tender over the R LS origin. Cues required to keep scapulae down and to retract with postural strengthening. Added more postural stretching and correction exercises to HEP. She reports independence with initial HEP so STG #1 is met.[place] will benefit from continued skilled PT to address ongoing abnormal muscle tension, ROM and strength deficits to improve mobility and activity tolerance with decreased pain interference.     OBJECTIVE IMPAIRMENTS: Abnormal gait, decreased activity tolerance, decreased balance, decreased mobility, difficulty walking, decreased ROM, decreased strength, increased fascial restrictions, impaired perceived functional ability, increased muscle spasms, impaired UE functional use, postural dysfunction, and pain.   ACTIVITY LIMITATIONS: carrying, lifting, standing, squatting, stairs, transfers, reach over head, locomotion level, and caring for others  PARTICIPATION LIMITATIONS: meal prep, cleaning, laundry, driving, shopping, community activity, and yard work  PERSONAL FACTORS: Age, Behavior pattern, Education, Fitness, Past/current experiences, Social background, and Time since onset of injury/illness/exacerbation are also affecting patient's functional outcome.   REHAB POTENTIAL: Fair ongoing issues with pain since surgery January 2024, gross deconditioning, not compliant with HEP since DC from PT   CLINICAL DECISION MAKING: Stable/uncomplicated  EVALUATION COMPLEXITY: Low   GOALS: Goals reviewed with patient? Yes  SHORT TERM GOALS: Target date: 08/11/2023    Patient will be  independent with initial HEP to improve outcomes and carryover.  Baseline:  Goal status: MET- 08/02/23  2.  Patient will report 25% improvement in B shoulder pain to improve QOL.   Baseline:  Goal status: IN PROGRESS  3.  Will be able to dress/perform all self care activities without increased pain in neck and shoulders. Baseline:  Goal status: IN PROGRESS   LONG TERM GOALS: Target date: 09/08/2023   Patient will be independent with ongoing/advanced HEP for self-management at home.  Baseline:  Goal status: IN PROGRESS  2.  Patient will report 50-75% improvement in B shoulder pain to improve QOL.  Baseline:  Goal status: IN PROGRESS  3.  Patient to demonstrate improved upright posture with posterior shoulder girdle engaged to promote improved glenohumeral joint mobility. Baseline:  Goal status: IN PROGRESS  4.  R shoulder AROM to be equal to L without pain provocation to allow for increased ease of ADLs.  Baseline:  Goal status: IN PROGRESS  5.  MMT will have improved by at least one grade in all weak groups  Goal status: IN PROGRESS  6  QuickDASH to have improved by at least 15 points to show improved QOL/subjective improvement  Baseline: 59.1% Goal status: IN PROGRESS  7.  Patient will experience at least a 50% improvement in sciatica and low back discomfort  Baseline:  Goal status: IN PROGRESS   8. Will be able to perform exercise routine as desired without increase in pain in shoulders/back/LEs  Baseline:  Goal status: IN PROGRESS   PLAN:  PT FREQUENCY: 1-2x/week  PT DURATION: 8 weeks  PLANNED INTERVENTIONS: 97164- PT Re-evaluation, 97110-Therapeutic exercises, 97530- Therapeutic activity, 97112- Neuromuscular re-education, 97535- Self Care, 21308- Manual therapy, [id number]- Aquatic Therapy, 97014- Electrical stimulation (unattended), 97033- Ionotophoresis 4mg /ml Dexamethasone , Taping, Dry Needling, Cryotherapy, and Moist heat  PLAN FOR NEXT SESSION:  Shoulder ROM  and strength/postural strength, proximal and core strength and LE stretching as appropriate, general conditioning. Does not want dry needling.  Might benefit from water PT if progress is slow in land PT and she is agreeable, has been to [facility] clinic before.    Miles Leyda L Iniko Robles, PTA  08/02/23 3:47 PM

## 2023-08-03 MED ORDER — LOVASTATIN 10 MG PO TABS: 10.0000 mg | ORAL_TABLET | Freq: Every day | ORAL | 1 refills | Status: DC

## 2023-08-04 ENCOUNTER — Encounter: Payer: Self-pay | Admitting: Physical Therapy

## 2023-08-04 ENCOUNTER — Ambulatory Visit: Payer: PPO | Admitting: Physical Therapy

## 2023-08-04 ENCOUNTER — Telehealth: Payer: Self-pay | Admitting: Adult Health

## 2023-08-04 DIAGNOSIS — M5459 Other low back pain: Secondary | ICD-10-CM

## 2023-08-04 DIAGNOSIS — M25511 Pain in right shoulder: Secondary | ICD-10-CM | POA: Diagnosis not present

## 2023-08-04 DIAGNOSIS — M6281 Muscle weakness (generalized): Secondary | ICD-10-CM

## 2023-08-04 DIAGNOSIS — R29898 Other symptoms and signs involving the musculoskeletal system: Secondary | ICD-10-CM

## 2023-08-04 DIAGNOSIS — G8929 Other chronic pain: Secondary | ICD-10-CM

## 2023-08-04 NOTE — Telephone Encounter (Signed)
Rescheduled on 08/13/23 at 11:00am

## 2023-08-04 NOTE — Therapy (Signed)
OUTPATIENT PHYSICAL THERAPY TREATMENT   Patient Name: Tonya [name] MRN: [id number] DOB:15-Jun-1951, 73 y.o., female Today's Date: 08/04/2023   END OF SESSION:  PT End of Session - 08/04/23 1020     Visit Number 4    Date for PT Re-Evaluation 09/08/23    Authorization Type HTA    Progress Note Due on Visit 10    PT Start Time 1020   Pt arrived late   PT Stop Time 1104    PT Time Calculation (min) 44 min    Activity Tolerance Patient tolerated treatment well    Behavior During Therapy [facility] for tasks assessed/performed                Past Medical History:  Diagnosis Date   Allergy June 2012   Seasonal   Asthma 2015   Cancer [facility]) 2010   Left Breast   Coronary atherosclerosis due to calcified coronary lesion    GERD (gastroesophageal reflux disease)    Many years ago   History of breast cancer    Hyperlipidemia    Hypertension    Osteopenia    Past Surgical History:  Procedure Laterality Date   ABDOMINAL HYSTERECTOMY  2015   AUGMENTATION MAMMAPLASTY Left    BREAST BIOPSY Right 02/2019   BREAST IMPLANT EXCHANGE     COLONOSCOPY     COSMETIC SURGERY  2010   Breast cancer related   FOOT SURGERY Left 05/25/2020   MASTECTOMY Left    REDUCTION MAMMAPLASTY Right    ROTATOR CUFF REPAIR Right 07/17/2022   TONSILLECTOMY AND ADENOIDECTOMY  1958   Patient Active Problem List   Diagnosis Date Noted   MCI (mild cognitive impairment) 05/31/2023   Alzheimer's disease, unspecified (CODE) (HCC) 05/31/2023   Persistent disorder of initiating or maintaining sleep 04/13/2023   Short-term memory loss 03/01/2023   History of breast cancer 03/01/2023   Acute bursitis of right shoulder 04/21/2022   Other bursal cyst, left ankle and foot 04/21/2022   Coronary artery disease due to lipid rich plaque 09/30/2021   Hepatic steatosis 09/30/2021   Adenoma of left adrenal gland 09/30/2021   Strain of left trapezius muscle 09/30/2021   Hyperlipidemia 05/19/2021   Somatic  dysfunction of spine, thoracic 03/26/2021   Chronic left-sided thoracic back pain 12/30/2020   DCIS (ductal carcinoma in situ) 01/26/2020   Low back pain 01/26/2020   Chronic pain of both knees 09/19/2019   Osteopenia    Estrogen deficiency 04/14/2018   Essential hypertension 01/10/2018   Gastroesophageal reflux disease 01/10/2018   Mild intermittent asthma without complication 01/10/2018   Pain in left lower leg 03/03/2017   Family history of colon cancer 02/23/2017   Herpes simplex vulvovaginitis 02/18/2017   Neuropathy 02/18/2017   Elevated liver enzymes 02/17/2017   Need for Streptococcus pneumoniae vaccination 02/17/2017   Chondromalacia of left patella 12/28/2016   Synovitis of left knee 12/28/2016   Seasonal allergic rhinitis due to fungal spores 09/25/2016   Bone mass 09/24/2016   Chronic hip pain 09/24/2016   Dark yellow-colored urine 09/24/2016   Low oxygen saturation 09/24/2016   Other specified postprocedural states 01/17/2015   Abnormal stress electrocardiogram test 10/20/2013   Breast mass 12/11/2010   Family history of malignant neoplasm of breast 12/11/2010    PCP: Sharlene Dory, DO   REFERRING PROVIDER: Sharlene Dory, DO  REFERRING DIAG: [phone] (ICD-10-CM) - Bilateral shoulder pain, unspecified chronicity   THERAPY DIAG:  Chronic right shoulder pain  Chronic left shoulder pain  Muscle weakness (generalized)  Other low back pain  Other symptoms and signs involving the musculoskeletal system  RATIONALE FOR EVALUATION AND TREATMENT: Rehabilitation  ONSET DATE: chronic- had initial rotator cuff surgery January 2024  NEXT MD VISIT: Referring PRN    SUBJECTIVE:                                                                                                                                                                                                         SUBJECTIVE STATEMENT: Pt reports her pain is better today.  Pain  mostly in R upper arm/anterior shoulder, but also notes some pain in L LS.  EVAL:  I'm having issues with both shoulders, R>L. Had R rotator cuff repair in the past (January 2024), that one hurts worse and now the left is hurting because I have started using my left arm more. Upper back is sore if I take a deep breath and if I shrug my shoulders. Feels like my right shoulder blade is "out". Muscle directly below my shoulder blade can go numb sometimes. Both arms ache at times all the way down to elbows and sometimes forearms. Feel very weak. Having some issues with my legs as well, I hurt everywhere in my body. When I try to reach across myself I get a bad pinch in my shoulder.   PAIN: Are you having pain? Yes: NPRS scale: 5/10  Pain location: R shoulder/upper arm  Pain description: sharp and sore  Aggravating factors: movement in general  Relieving factors: not moving, ice, heat   PERTINENT HISTORY:  R RCR 07/20/22, osteopenia, L breast cancer, HTN, CAD, Alzheimer's dementia, LBP, sciatica, neuropathy, fatigue  PRECAUTIONS: None  RED FLAGS: None  HAND DOMINANCE: Right  WEIGHT BEARING RESTRICTIONS: No  FALLS:  Has patient fallen in last 6 months? No  LIVING ENVIRONMENT: Lives with: lives with their spouse Lives in: House/apartment  OCCUPATION: retired - used to work in Automotive engineer  PLOF: Independent, Independent with basic ADLs, Independent with gait, and Independent with transfers  PATIENT GOALS:  address pain, be able to do more, be able to exercise to lose weight/tolerate exercise better    OBJECTIVE: (objective measures completed at initial evaluation unless otherwise dated)  DIAGNOSTIC FINDINGS:  06/11/22 - R shoulder MRI: IMPRESSION: 1. Severe tendinosis of the supraspinatus tendon with a large full-thickness, near complete, tear with 3.8 cm of retraction and a few intact anterior most fibers. 2. Moderate tendinosis of the infraspinatus tendon with a tiny  interstitial tear. 3. Mild tendinosis of the intra-articular portion of the long  head of the biceps tendon. 04/21/22 - DG R shoulder: IMPRESSION: No fracture or dislocation of the right shoulder. Mild acromioclavicular and glenohumeral arthrosis.  PATIENT SURVEYS:  Quick Dash 59.1%  COGNITION: Overall cognitive status: History of cognitive impairments - at baseline mild cognitive impairment at baseline per chart    POSTURE: rounded shoulders, forward head, and increased thoracic kyphosis  UPPER EXTREMITY ROM:   Active ROM Right eval Left eval  Shoulder flexion 145 160  Shoulder extension    Shoulder abduction 145 170  Shoulder adduction    Shoulder internal rotation L4  T7  Shoulder external rotation C4 C7  Elbow flexion    Elbow extension    Wrist flexion    Wrist extension    Wrist ulnar deviation    Wrist radial deviation    Wrist pronation    Wrist supination    (Blank rows = not tested)  UPPER/LOWER EXTREMITY MMT:  MMT Right eval Left eval  Shoulder flexion 3+ 4  Shoulder extension    Shoulder abduction 3 4  Shoulder adduction    Shoulder internal rotation 4 4  Shoulder external rotation 3 4  Middle trapezius    Lower trapezius    Elbow flexion    Elbow extension    Wrist flexion    Wrist extension    Wrist ulnar deviation    Wrist radial deviation    Wrist pronation    Wrist supination    Grip strength (lbs)    Hip flexion  5 5  Quads  5 5  Hamstrings  4+ 4+  Ankle dorsiflexion  5 5  (Blank rows = not tested)  PALPATION:  Extremely tight in all paraspinals, upper traps, R anterior shoulder and biceps, suboccipitals     TODAY'S TREATMENT:   08/04/23 THERAPEUTIC EXERCISE: To improve strength and flexibility.  Demonstration, verbal and tactile cues throughout for technique.  UBE - L1.5 x 6 min (3' fwd & back) - pt reports more pain in anterior upper arm with backwards motion Seated R anterior deltoid/biceps stretch 2 x 30" (shoulder extended with  long-arm ER and hand facing back on mat table + fwd trunk weight shift and slight L rotation) Seated scap retraction into pool noodle + RTB B shoulder horiz ABD 2 x 10 Seated scap retraction into pool noodle + RTB B shoulder ER 2 x 10  MANUAL THERAPY: To promote normalized muscle tension, improved flexibility, improved joint mobility, increased ROM, and reduced pain utilizing joint mobilization, connective tissue massage, therapeutic massage, and manual TP therapy. STM/DTM and manual TPR to R anterior deltoid and biceps, L LS and rhomboids R/L scapular mobilization - emphasis on retraction and depression    08/02/23 THERAPEUTIC EXERCISE: to improve flexibility, strength and mobility.  Demonstration, verbal and tactile cues throughout for technique.  UBE - L1.0 x 5 min  Standing row YTB 10x3"; 2 sets Standing shoulder ext YTB 10x3"; 2 sets Seated chin tucks 10x3" Seated thoracic extension 10x3"  MANUAL THERAPY: To promote normalized muscle tension, improved flexibility, improved joint mobility, increased ROM, and reduced pain. STM/DTM and manual TPR to R UT, LS, rhomboids, subscapularis, infraspinatus, teres group   07/19/23 THERAPEUTIC EXERCISE: to improve flexibility, strength and mobility.  Demonstration, verbal and tactile cues throughout for technique.  UBE - L1.0 x 6 min (3' fwd & back) - pt reports numbness under her shoulder blade building during warm-up Seated gentle UT stretch 2 x 30" bil Seated gentle LS stretch 2 x 30" bil Seated scapular  retraction 5 x 5" - PT providing tactile guidance however patient tending to want to shrug shoulders Standing YTB scap retraction + shoulder row 10 x 3" - cues to drop elbows towards floor while squeezing shoulder blades together Standing YTB scap retraction + shoulder extension 10 x 3"   MANUAL THERAPY: To promote normalized muscle tension, improved flexibility, improved joint mobility, increased ROM, and reduced pain. STM/DTM and manual TPR  to R UT, LS, rhomboids, subscapularis, infraspinatus, teres group, and subscapularis Scapular mobilization in S/L    PATIENT EDUCATION:  Education details: HEP update and postural awareness  Person educated: Patient Education method: Explanation, Demonstration, Tactile cues, Verbal cues, and Handouts Education comprehension: verbalized understanding, returned demonstration, verbal cues required, tactile cues required, and needs further education  HOME EXERCISE PROGRAM: Access Code: [email or web address] URL: [email or web address] Date: 08/04/2023 Prepared by: Glenetta Hew  Exercises - Seated Gentle Upper Trapezius Stretch  - 2 x daily - 7 x weekly - 3 reps - 30 sec hold - Gentle Levator Scapulae Stretch (Mirrored)  - 2 x daily - 7 x weekly - 3 reps - 30 sec hold - Standing Bilateral Low Shoulder Row with Anchored Resistance  - 1 x daily - 7 x weekly - 2 sets - 10 reps - 5 sec hold - Scapular Retraction with Resistance Advanced  - 1 x daily - 7 x weekly - 2 sets - 10 reps - 5 sec hold - Seated Thoracic Lumbar Extension  - 1 x daily - 7 x weekly - 2 sets - 10 reps - Seated Cervical Retraction  - 1 x daily - 7 x weekly - 2 sets - 10 reps - Bicep Stretch at Table  - 2 x daily - 7 x weekly - 2 sets - 10 reps - 3 sec hold - Doorway Pec Stretch at 60 Degrees Abduction with Arm Straight  - 2 x daily - 7 x weekly - 3 reps - 30 sec hold   ASSESSMENT:  CLINICAL IMPRESSION: Anari reports her pain seems to be improving but still notes limitation with functional ROM and use of R UE due to R anterior shoulder/deltoid and upper arm/biceps pain.  Continued increased muscle tension/taut bands/TPs identified in these muscles as well as L LS, which were addressed with MT including STM/DTM and manual TPR (pt prefers not to try TPDN due to increased pain and limited benefit noted from [facility] during a prior PT episode at another clinic).  Essense reports further reduction in pain with better movement tolerance  but still not pain free.  Progressed scapular strengthening utilizing pool noodle for tactile feedback for posture and scapular muscle engagement - will assess response to new exercises and consider HEP progression next visit.  Leighla will benefit from continued skilled PT to address ongoing abnormal muscle tension, ROM and strength deficits to improve mobility and activity tolerance with decreased pain interference.     OBJECTIVE IMPAIRMENTS: Abnormal gait, decreased activity tolerance, decreased balance, decreased mobility, difficulty walking, decreased ROM, decreased strength, increased fascial restrictions, impaired perceived functional ability, increased muscle spasms, impaired UE functional use, postural dysfunction, and pain.   ACTIVITY LIMITATIONS: carrying, lifting, standing, squatting, stairs, transfers, reach over head, locomotion level, and caring for others  PARTICIPATION LIMITATIONS: meal prep, cleaning, laundry, driving, shopping, community activity, and yard work  PERSONAL FACTORS: Age, Behavior pattern, Education, Fitness, Past/current experiences, Social background, and Time since onset of injury/illness/exacerbation are also affecting patient's functional outcome.   REHAB POTENTIAL: Fair ongoing issues with pain since surgery  January 2024, gross deconditioning, not compliant with HEP since DC from PT   CLINICAL DECISION MAKING: Stable/uncomplicated  EVALUATION COMPLEXITY: Low   GOALS: Goals reviewed with patient? Yes  SHORT TERM GOALS: Target date: 08/11/2023    Patient will be independent with initial HEP to improve outcomes and carryover.  Baseline:  Goal status: MET- 08/02/23  2.  Patient will report 25% improvement in B shoulder pain to improve QOL.   Baseline:  Goal status: IN PROGRESS  3.  Will be able to dress/perform all self care activities without increased pain in neck and shoulders. Baseline:  Goal status: IN PROGRESS   LONG TERM GOALS: Target date:  09/08/2023   Patient will be independent with ongoing/advanced HEP for self-management at home.  Baseline:  Goal status: IN PROGRESS  2.  Patient will report 50-75% improvement in B shoulder pain to improve QOL.  Baseline:  Goal status: IN PROGRESS  3.  Patient to demonstrate improved upright posture with posterior shoulder girdle engaged to promote improved glenohumeral joint mobility. Baseline:  Goal status: IN PROGRESS  4.  R shoulder AROM to be equal to L without pain provocation to allow for increased ease of ADLs.  Baseline:  Goal status: IN PROGRESS  5.  MMT will have improved by at least one grade in all weak groups  Goal status: IN PROGRESS  6  QuickDASH to have improved by at least 15 points to show improved QOL/subjective improvement  Baseline: 59.1% Goal status: IN PROGRESS  7.  Patient will experience at least a 50% improvement in sciatica and low back discomfort    Baseline:  Goal status: IN PROGRESS   8. Will be able to perform exercise routine as desired without increase in pain in shoulders/back/LEs  Baseline:  Goal status: IN PROGRESS   PLAN:  PT FREQUENCY: 1-2x/week  PT DURATION: 8 weeks  PLANNED INTERVENTIONS: 97164- PT Re-evaluation, 97110-Therapeutic exercises, 97530- Therapeutic activity, [id number]- Neuromuscular re-education, 97535- Self Care, [id number]- Manual therapy, [id number]- Aquatic Therapy, 97014- Electrical stimulation (unattended), 97033- Ionotophoresis 4mg /ml Dexamethasone, Taping, Dry Needling, Cryotherapy, and Moist heat  PLAN FOR NEXT SESSION:  Continue STG assessment; Shoulder ROM and strength/postural strength, proximal and core strength and LE stretching as appropriate, general conditioning. Does not want dry needling.  Might benefit from water PT if progress is slow in land PT and she is agreeable, has been to [facility] clinic before.    Marry Guan, PT  08/04/23 1:54 PM

## 2023-08-04 NOTE — Telephone Encounter (Signed)
LVM and sent mychart msg informing pt of r/s needed for 2/18 appt- NP out.

## 2023-08-05 ENCOUNTER — Encounter: Payer: PPO | Admitting: Physical Therapy

## 2023-08-08 ENCOUNTER — Other Ambulatory Visit: Payer: Self-pay | Admitting: Family Medicine

## 2023-08-09 ENCOUNTER — Ambulatory Visit: Payer: PPO

## 2023-08-09 DIAGNOSIS — R29898 Other symptoms and signs involving the musculoskeletal system: Secondary | ICD-10-CM

## 2023-08-09 DIAGNOSIS — G8929 Other chronic pain: Secondary | ICD-10-CM

## 2023-08-09 DIAGNOSIS — M6281 Muscle weakness (generalized): Secondary | ICD-10-CM

## 2023-08-09 DIAGNOSIS — M25511 Pain in right shoulder: Secondary | ICD-10-CM | POA: Diagnosis not present

## 2023-08-09 DIAGNOSIS — M5459 Other low back pain: Secondary | ICD-10-CM

## 2023-08-09 NOTE — Therapy (Addendum)
OUTPATIENT PHYSICAL THERAPY TREATMENT   Patient Name: Tonya [name] MRN: [id number] DOB:August 20, 1950, 73 y.o., female Today's Date: 08/09/2023   END OF SESSION:  PT End of Session - 08/09/23 1519     Visit Number 5    Date for PT Re-Evaluation 09/08/23    Authorization Type HTA    Progress Note Due on Visit 10    PT Start Time 1445    PT Stop Time 1532    PT Time Calculation (min) 47 min    Activity Tolerance Patient tolerated treatment well    Behavior During Therapy [facility] for tasks assessed/performed                 Past Medical History:  Diagnosis Date   Allergy June 2012   Seasonal   Asthma 2015   Cancer [facility]) 2010   Left Breast   Coronary atherosclerosis due to calcified coronary lesion    GERD (gastroesophageal reflux disease)    Many years ago   History of breast cancer    Hyperlipidemia    Hypertension    Osteopenia    Past Surgical History:  Procedure Laterality Date   ABDOMINAL HYSTERECTOMY  2015   AUGMENTATION MAMMAPLASTY Left    BREAST BIOPSY Right 02/2019   BREAST IMPLANT EXCHANGE     COLONOSCOPY     COSMETIC SURGERY  2010   Breast cancer related   FOOT SURGERY Left 05/25/2020   MASTECTOMY Left    REDUCTION MAMMAPLASTY Right    ROTATOR CUFF REPAIR Right 07/17/2022   TONSILLECTOMY AND ADENOIDECTOMY  1958   Patient Active Problem List   Diagnosis Date Noted   MCI (mild cognitive impairment) 05/31/2023   Alzheimer's disease, unspecified (CODE) (HCC) 05/31/2023   Persistent disorder of initiating or maintaining sleep 04/13/2023   Short-term memory loss 03/01/2023   History of breast cancer 03/01/2023   Acute bursitis of right shoulder 04/21/2022   Other bursal cyst, left ankle and foot 04/21/2022   Coronary artery disease due to lipid rich plaque 09/30/2021   Hepatic steatosis 09/30/2021   Adenoma of left adrenal gland 09/30/2021   Strain of left trapezius muscle 09/30/2021   Hyperlipidemia 05/19/2021   Somatic dysfunction of spine,  thoracic 03/26/2021   Chronic left-sided thoracic back pain 12/30/2020   DCIS (ductal carcinoma in situ) 01/26/2020   Low back pain 01/26/2020   Chronic pain of both knees 09/19/2019   Osteopenia    Estrogen deficiency 04/14/2018   Essential hypertension 01/10/2018   Gastroesophageal reflux disease 01/10/2018   Mild intermittent asthma without complication 01/10/2018   Pain in left lower leg 03/03/2017   Family history of colon cancer 02/23/2017   Herpes simplex vulvovaginitis 02/18/2017   Neuropathy 02/18/2017   Elevated liver enzymes 02/17/2017   Need for Streptococcus pneumoniae vaccination 02/17/2017   Chondromalacia of left patella 12/28/2016   Synovitis of left knee 12/28/2016   Seasonal allergic rhinitis due to fungal spores 09/25/2016   Bone mass 09/24/2016   Chronic hip pain 09/24/2016   Dark yellow-colored urine 09/24/2016   Low oxygen saturation 09/24/2016   Other specified postprocedural states 01/17/2015   Abnormal stress electrocardiogram test 10/20/2013   Breast mass 12/11/2010   Family history of malignant neoplasm of breast 12/11/2010    PCP: Sharlene Dory, DO   REFERRING PROVIDER: Sharlene Dory, DO  REFERRING DIAG: [phone] (ICD-10-CM) - Bilateral shoulder pain, unspecified chronicity   THERAPY DIAG:  Chronic right shoulder pain  Chronic left shoulder pain  Muscle weakness (  generalized)  Other low back pain  Other symptoms and signs involving the musculoskeletal system  RATIONALE FOR EVALUATION AND TREATMENT: Rehabilitation  ONSET DATE: chronic- had initial rotator cuff surgery January 2024  NEXT MD VISIT: Referring PRN    SUBJECTIVE:                                                                                                                                                                                                         SUBJECTIVE STATEMENT: Pt reports aching in back  EVAL:  I'm having issues with both  shoulders, R>L. Had R rotator cuff repair in the past (January 2024), that one hurts worse and now the left is hurting because I have started using my left arm more. Upper back is sore if I take a deep breath and if I shrug my shoulders. Feels like my right shoulder blade is "out". Muscle directly below my shoulder blade can go numb sometimes. Both arms ache at times all the way down to elbows and sometimes forearms. Feel very weak. Having some issues with my legs as well, I hurt everywhere in my body. When I try to reach across myself I get a bad pinch in my shoulder.   PAIN: Are you having pain? Yes: NPRS scale: 5/10  Pain location: R shoulder/upper arm  Pain description: sharp and sore  Aggravating factors: movement in general  Relieving factors: not moving, ice, heat   PERTINENT HISTORY:  R RCR 07/20/22, osteopenia, L breast cancer, HTN, CAD, Alzheimer's dementia, LBP, sciatica, neuropathy, fatigue  PRECAUTIONS: None  RED FLAGS: None  HAND DOMINANCE: Right  WEIGHT BEARING RESTRICTIONS: No  FALLS:  Has patient fallen in last 6 months? No  LIVING ENVIRONMENT: Lives with: lives with their spouse Lives in: House/apartment  OCCUPATION: retired - used to work in Automotive engineer  PLOF: Independent, Independent with basic ADLs, Independent with gait, and Independent with transfers  PATIENT GOALS:  address pain, be able to do more, be able to exercise to lose weight/tolerate exercise better    OBJECTIVE: (objective measures completed at initial evaluation unless otherwise dated)  DIAGNOSTIC FINDINGS:  06/11/22 - R shoulder MRI: IMPRESSION: 1. Severe tendinosis of the supraspinatus tendon with a large full-thickness, near complete, tear with 3.8 cm of retraction and a few intact anterior most fibers. 2. Moderate tendinosis of the infraspinatus tendon with a tiny interstitial tear. 3. Mild tendinosis of the intra-articular portion of the long head of the biceps  tendon. 04/21/22 - DG R shoulder: IMPRESSION: No fracture or dislocation of the right shoulder. Mild  acromioclavicular and glenohumeral arthrosis.  PATIENT SURVEYS:  Quick Dash 59.1%  COGNITION: Overall cognitive status: History of cognitive impairments - at baseline mild cognitive impairment at baseline per chart    POSTURE: rounded shoulders, forward head, and increased thoracic kyphosis  UPPER EXTREMITY ROM:   Active ROM Right eval Left eval  Shoulder flexion 145 160  Shoulder extension    Shoulder abduction 145 170  Shoulder adduction    Shoulder internal rotation L4  T7  Shoulder external rotation C4 C7  Elbow flexion    Elbow extension    Wrist flexion    Wrist extension    Wrist ulnar deviation    Wrist radial deviation    Wrist pronation    Wrist supination    (Blank rows = not tested)  UPPER/LOWER EXTREMITY MMT:  MMT Right eval Left eval  Shoulder flexion 3+ 4  Shoulder extension    Shoulder abduction 3 4  Shoulder adduction    Shoulder internal rotation 4 4  Shoulder external rotation 3 4  Middle trapezius    Lower trapezius    Elbow flexion    Elbow extension    Wrist flexion    Wrist extension    Wrist ulnar deviation    Wrist radial deviation    Wrist pronation    Wrist supination    Grip strength (lbs)    Hip flexion  5 5  Quads  5 5  Hamstrings  4+ 4+  Ankle dorsiflexion  5 5  (Blank rows = not tested)  PALPATION:  Extremely tight in all paraspinals, upper traps, R anterior shoulder and biceps, suboccipitals     TODAY'S TREATMENT:  08/09/23 THERAPEUTIC EXERCISE: To improve strength and flexibility.  Demonstration, verbal and tactile cues throughout for technique.  UBE - L1.5 x 6 min (3' fwd 2 back) Supine chin tucks x 20 Supine shoulder diagonals YTB x 10 each way Supine shoulder ER YTB- p! Standing rows and ext RTB- review  MANUAL THERAPY: To promote normalized muscle tension, improved flexibility, improved joint mobility,  increased ROM, and reduced pain utilizing joint mobilization, connective tissue massage, therapeutic massage, and manual TP therapy. STM/DTM and manual TPR to bil  L LS and rhomboids, suboccipital release  08/04/23 THERAPEUTIC EXERCISE: To improve strength and flexibility.  Demonstration, verbal and tactile cues throughout for technique.  UBE - L1.5 x 6 min (3' fwd & back) - pt reports more pain in anterior upper arm with backwards motion Seated R anterior deltoid/biceps stretch 2 x 30" (shoulder extended with long-arm ER and hand facing back on mat table + fwd trunk weight shift and slight L rotation) Seated scap retraction into pool noodle + RTB B shoulder horiz ABD 2 x 10 Seated scap retraction into pool noodle + RTB B shoulder ER 2 x 10  MANUAL THERAPY: To promote normalized muscle tension, improved flexibility, improved joint mobility, increased ROM, and reduced pain utilizing joint mobilization, connective tissue massage, therapeutic massage, and manual TP therapy. STM/DTM and manual TPR to R anterior deltoid and biceps, L LS and rhomboids R/L scapular mobilization - emphasis on retraction and depression    08/02/23 THERAPEUTIC EXERCISE: to improve flexibility, strength and mobility.  Demonstration, verbal and tactile cues throughout for technique.  UBE - L1.0 x 5 min  Standing row YTB 10x3"; 2 sets Standing shoulder ext YTB 10x3"; 2 sets Seated chin tucks 10x3" Seated thoracic extension 10x3"  MANUAL THERAPY: To promote normalized muscle tension, improved flexibility, improved joint mobility, increased ROM, and  reduced pain. STM/DTM and manual TPR to R UT, LS, rhomboids, subscapularis, infraspinatus, teres group   07/19/23 THERAPEUTIC EXERCISE: to improve flexibility, strength and mobility.  Demonstration, verbal and tactile cues throughout for technique.  UBE - L1.0 x 6 min (3' fwd & back) - pt reports numbness under her shoulder blade building during warm-up Seated gentle UT  stretch 2 x 30" bil Seated gentle LS stretch 2 x 30" bil Seated scapular retraction 5 x 5" - PT providing tactile guidance however patient tending to want to shrug shoulders Standing YTB scap retraction + shoulder row 10 x 3" - cues to drop elbows towards floor while squeezing shoulder blades together Standing YTB scap retraction + shoulder extension 10 x 3"   MANUAL THERAPY: To promote normalized muscle tension, improved flexibility, improved joint mobility, increased ROM, and reduced pain. STM/DTM and manual TPR to R UT, LS, rhomboids, subscapularis, infraspinatus, teres group, and subscapularis Scapular mobilization in S/L    PATIENT EDUCATION:  Education details: HEP update and postural awareness  Person educated: Patient Education method: Explanation, Demonstration, Tactile cues, Verbal cues, and Handouts Education comprehension: verbalized understanding, returned demonstration, verbal cues required, tactile cues required, and needs further education  HOME EXERCISE PROGRAM: Access Code: [email or web address] URL: [email or web address] Date: 08/04/2023 Prepared by: Glenetta Hew  Exercises - Seated Gentle Upper Trapezius Stretch  - 2 x daily - 7 x weekly - 3 reps - 30 sec hold - Gentle Levator Scapulae Stretch (Mirrored)  - 2 x daily - 7 x weekly - 3 reps - 30 sec hold - Standing Bilateral Low Shoulder Row with Anchored Resistance  - 1 x daily - 7 x weekly - 2 sets - 10 reps - 5 sec hold - Scapular Retraction with Resistance Advanced  - 1 x daily - 7 x weekly - 2 sets - 10 reps - 5 sec hold - Seated Thoracic Lumbar Extension  - 1 x daily - 7 x weekly - 2 sets - 10 reps - Seated Cervical Retraction  - 1 x daily - 7 x weekly - 2 sets - 10 reps - Bicep Stretch at Table  - 2 x daily - 7 x weekly - 2 sets - 10 reps - 3 sec hold - Doorway Pec Stretch at 60 Degrees Abduction with Arm Straight  - 2 x daily - 7 x weekly - 3 reps - 30 sec hold   ASSESSMENT:  CLINICAL  IMPRESSION: Continued strengthening interventions to work on [employer]. Progressed postural strengthening with RTB for HEP. She still demonstrates tightness along her LS and rhomboids on both sides and also had tightness in her suboccipitals. Cues provided as needed with interventions to correct form. Charrise will benefit from continued skilled PT to address ongoing abnormal muscle tension, ROM and strength deficits to improve mobility and activity tolerance with decreased pain interference.     OBJECTIVE IMPAIRMENTS: Abnormal gait, decreased activity tolerance, decreased balance, decreased mobility, difficulty walking, decreased ROM, decreased strength, increased fascial restrictions, impaired perceived functional ability, increased muscle spasms, impaired UE functional use, postural dysfunction, and pain.   ACTIVITY LIMITATIONS: carrying, lifting, standing, squatting, stairs, transfers, reach over head, locomotion level, and caring for others  PARTICIPATION LIMITATIONS: meal prep, cleaning, laundry, driving, shopping, community activity, and yard work  PERSONAL FACTORS: Age, Behavior pattern, Education, Fitness, Past/current experiences, Social background, and Time since onset of injury/illness/exacerbation are also affecting patient's functional outcome.   REHAB POTENTIAL: Fair ongoing issues with pain since surgery January 2024, gross deconditioning, not compliant  with HEP since DC from PT   CLINICAL DECISION MAKING: Stable/uncomplicated  EVALUATION COMPLEXITY: Low   GOALS: Goals reviewed with patient? Yes  SHORT TERM GOALS: Target date: 08/11/2023    Patient will be independent with initial HEP to improve outcomes and carryover.  Baseline:  Goal status: MET- 08/02/23  2.  Patient will report 25% improvement in B shoulder pain to improve QOL.   Baseline:  Goal status: IN PROGRESS  3.  Will be able to dress/perform all self care activities without increased pain in neck and  shoulders. Baseline:  Goal status: IN PROGRESS   LONG TERM GOALS: Target date: 09/08/2023   Patient will be independent with ongoing/advanced HEP for self-management at home.  Baseline:  Goal status: IN PROGRESS  2.  Patient will report 50-75% improvement in B shoulder pain to improve QOL.  Baseline:  Goal status: IN PROGRESS  3.  Patient to demonstrate improved upright posture with posterior shoulder girdle engaged to promote improved glenohumeral joint mobility. Baseline:  Goal status: IN PROGRESS  4.  R shoulder AROM to be equal to L without pain provocation to allow for increased ease of ADLs.  Baseline:  Goal status: IN PROGRESS  5.  MMT will have improved by at least one grade in all weak groups  Goal status: IN PROGRESS  6  QuickDASH to have improved by at least 15 points to show improved QOL/subjective improvement  Baseline: 59.1% Goal status: IN PROGRESS  7.  Patient will experience at least a 50% improvement in sciatica and low back discomfort    Baseline:  Goal status: IN PROGRESS   8. Will be able to perform exercise routine as desired without increase in pain in shoulders/back/LEs  Baseline:  Goal status: IN PROGRESS   PLAN:  PT FREQUENCY: 1-2x/week  PT DURATION: 8 weeks  PLANNED INTERVENTIONS: 97164- PT Re-evaluation, 97110-Therapeutic exercises, 97530- Therapeutic activity, [id number]- Neuromuscular re-education, 97535- Self Care, 91478- Manual therapy, [id number]- Aquatic Therapy, 97014- Electrical stimulation (unattended), 97033- Ionotophoresis 4mg /ml Dexamethasone, Taping, Dry Needling, Cryotherapy, and Moist heat  PLAN FOR NEXT SESSION:  Continue STG assessment; Shoulder ROM and strength/postural strength, proximal and core strength and LE stretching as appropriate, general conditioning. Does not want dry needling.  Might benefit from water PT if progress is slow in land PT and she is agreeable, has been to [facility] clinic before.    Darleene Cleaver, PTA  08/09/23  4:13 PM

## 2023-08-10 ENCOUNTER — Telehealth: Payer: Self-pay | Admitting: Neurology

## 2023-08-10 ENCOUNTER — Ambulatory Visit: Payer: PPO | Admitting: Adult Health

## 2023-08-10 NOTE — Telephone Encounter (Signed)
Pt's husband, Ayushi Pla asking if after appointment with Shanda Bumps, NP on 08/13/23 can we speak with Dr. Vickey Huger. Have been doing testing since last year and have not gotten feedback. Would like a call back

## 2023-08-11 ENCOUNTER — Encounter: Payer: Self-pay | Admitting: Neurology

## 2023-08-11 ENCOUNTER — Telehealth: Payer: PPO | Admitting: Neurology

## 2023-08-11 DIAGNOSIS — G309 Alzheimer's disease, unspecified: Secondary | ICD-10-CM | POA: Diagnosis not present

## 2023-08-11 NOTE — Progress Notes (Signed)
Virtual Visit via Video Note  I connected with Tonya [name] on 08/11/23 at 11:00 AM EST by a video enabled telemedicine application and verified that I am speaking with the correct person using two identifiers.  Location: Patient: at their home,  Provider: at her home   I discussed the limitations of evaluation and management by telemedicine and the availability of in person appointments. The patient expressed understanding and agreed to proceed.  History of Present Illness:  Alzheimer's Disease, confirmed by pathology ATN, PET amyloid scan and neuro-psychological testing with Dr Otilio Jefferson confirmed semantic aphasia and MCI. The patient has  noted progression since her last visit in December and is ready to start Leqembi.      Observations/Objective: both, patient and husband have noted a decline in eloquence, less fluent conversations and also difficulties to summarize conversations, news, an ability she was always proud of.   We discussed the risks and benefits of Leqembi, the  increased  risk of  bleeding in amyloid vasculopathy , there is an expected acceleration of brain atrophy under these MAB treatments. Any MRI needs to be ordered with the history of  Leqembi or MAB therapy recognized by the interpreting radiologist.  Elliot Gault is not a medication of last resor, it can be switched for other future therapies that may have more benefit for the patient.     Assessment and Plan: Friday visit for new MOCA.    Follow Up Instructions: see above     I discussed the assessment and treatment plan with the patient. The patient was provided an opportunity to ask questions and all were answered. The patient agreed with the plan and demonstrated an understanding of the instructions.   The patient was advised to call back or seek an in-person evaluation if the symptoms worsen or if the condition fails to improve as anticipated.  I provided 12 minutes of non-face-to-face time during this  encounter.   Melvyn Novas, MD

## 2023-08-11 NOTE — Telephone Encounter (Signed)
Contacted pt spouse back, he stated he wanted to talk to MD due to after all the testing, they have not received a solid plan and he wants to know next steps in pts care. I have scheduled him for 11am VV today to discuss with Dr Vickey Huger

## 2023-08-11 NOTE — Patient Instructions (Addendum)
There are well-accepted and sensible ways to reduce risk for Alzheimers disease and other degenerative brain disorders .  Exercise Daily Walk A daily 20 minute walk should be part of your routine. Disease related apathy can be a significant roadblock to exercise and the only way to overcome this is to make it a daily routine and perhaps have a reward at the end (something your loved one loves to eat or drink perhaps) or a personal trainer coming to the home can also be very useful. Most importantly, the patient is much more likely to exercise if the caregiver / spouse does it with him/her. In general a structured, repetitive schedule is best.  General Health: Any diseases which effect your body will effect your brain such as a pneumonia, urinary infection, blood clot, heart attack or stroke. Keep contact with your primary care doctor for regular follow ups.  Sleep. A good nights sleep is healthy for the brain. Seven hours is recommended. If you have insomnia or poor sleep habits we can give you some instructions. If you have sleep apnea wear your mask.  Diet: Eating a heart healthy diet is also a good idea; fish and poultry instead of red meat, nuts (mostly non-peanuts), vegetables, fruits, olive oil or canola oil (instead of butter), minimal salt (use other spices to flavor foods), whole grain rice, bread, cereal and pasta and wine in moderation.Research is now showing that the MIND diet, which is a combination of The Mediterranean diet and the DASH diet, is beneficial for cognitive processing and longevity. Information about this diet can be found in The MIND Diet, a book by Alonna Minium, MS, RDN, and online at [email or web address]  Finances, Power of [address] and Advance Directives: You should consider putting legal safeguards in place with regard to financial and medical decision making. While the spouse always has power of attorney for medical and financial issues in the  absence of any form, you should consider what you want in case the spouse / caregiver is no longer around or capable of making decisions.   The Alzheimers Association Position on Disease Prevention  Can Alzheimer's be prevented? It's a question that continues to intrigue researchers and fuel new investigations. There are no clear-cut answers yet -- partially due to the need for more large-scale studies in diverse populations -- but promising research is under way. The Alzheimer's Association is leading the worldwide effort to find a treatment for Alzheimer's, delay its onset and prevent it from developing.   What causes Alzheimer's? Experts agree that in the vast majority of cases, Alzheimer's, like other common chronic conditions, probably develops as a result of complex interactions among multiple factors, including age, genetics, environment, lifestyle and coexisting medical conditions. Although some risk factors -- such as age or genes -- cannot be changed, other risk factors -- such as high blood pressure and lack of exercise -- usually can be changed to help reduce risk. Research in these areas may lead to new ways to detect those at highest risk.  Prevention studies A small percentage of people with Alzheimer's disease (less than 1 percent) have an early-onset type associated with genetic mutations. Individuals who have these genetic mutations are guaranteed to develop the disease. An ongoing clinical trial conducted by the [facility] ([facility]), is testing whether antibodies to beta-amyloid can reduce the accumulation of beta-amyloid plaque in the brains of people with such genetic mutations and thereby reduce, delay or prevent symptoms. Participants in the trial are receiving antibodies (  or placebo) before they develop symptoms, and the development of beta-amyloid plaques is being monitored by brain scans and other tests.  Another clinical trial, known as the A4 trial  (Anti-Amyloid Treatment in Asymptomatic Alzheimer's), is testing whether antibodies to beta-amyloid can reduce the risk of Alzheimer's disease in older people (ages 70 to 48) at high risk for the disease. The A4 trial is being conducted by the Alzheimer's Disease Cooperative Study.  Though research is still evolving, evidence is strong that people can reduce their risk by making key lifestyle changes, including participating in regular activity and maintaining good heart health. Based on this research, the Alzheimer's Association offers 10 Ways to Love Your Brain -- a collection of tips that can reduce the risk of cognitive decline.  Heart-head connection  New research shows there are things we can do to reduce the risk of mild cognitive impairment and dementia.  Several conditions known to increase the risk of cardiovascular disease -- such as high blood pressure, diabetes and high cholesterol -- also increase the risk of developing Alzheimer's. Some autopsy studies show that as many as 80 percent of individuals with Alzheimer's disease also have cardiovascular disease.  A longstanding question is why some people develop hallmark Alzheimer's plaques and tangles but do not develop the symptoms of Alzheimer's. Vascular disease may help researchers eventually find an answer. Some autopsy studies suggest that plaques and tangles may be present in the brain without causing symptoms of cognitive decline unless the brain also shows evidence of vascular disease. More research is needed to better understand the link between vascular health and Alzheimer's.  Physical exercise and diet Regular physical exercise may be a beneficial strategy to lower the risk of Alzheimer's and vascular dementia. Exercise may directly benefit brain cells by increasing blood and oxygen flow in the brain. Because of its known cardiovascular benefits, a medically approved exercise program is a valuable part of any overall wellness  plan.  Current evidence suggests that heart-healthy eating may also help protect the brain. Heart-healthy eating includes limiting the intake of sugar and saturated fats and making sure to eat plenty of fruits, vegetables, and whole grains. No one diet is best. Two diets that have been studied and may be beneficial are the DASH (Dietary Approaches to Stop Hypertension) diet and the Mediterranean diet. The DASH diet emphasizes vegetables, fruits and fat-free or low-fat dairy products; includes whole grains, fish, poultry, beans, seeds, nuts and vegetable oils; and limits sodium, sweets, sugary beverages and red meats. A Mediterranean diet includes relatively little red meat and emphasizes whole grains, fruits and vegetables, fish and shellfish, and nuts, olive oil and other healthy fats.  Social connections and intellectual activity A number of studies indicate that maintaining strong social connections and keeping mentally active as we age might lower the risk of cognitive decline and Alzheimer's. Experts are not certain about the reason for this association. It may be due to direct mechanisms through which social and mental stimulation strengthen connections between nerve cells in the brain.  Head trauma There appears to be a strong link between future risk of Alzheimer's and serious head trauma, especially when injury involves loss of consciousness. You can help reduce your risk of Alzheimer's by protecting your head.  Wear a seat belt  Use a helmet when participating in sports  "Fall-proof" your home   What you can do now While research is not yet conclusive, certain lifestyle choices, such as physical activity and diet, may help support brain  health and prevent Alzheimer's. Many of these lifestyle changes have been shown to lower the risk of other diseases, like heart disease and diabetes, which have been linked to Alzheimer's. With few drawbacks and plenty of known benefits, healthy lifestyle  choices can improve your health and possibly protect your brain.  Learn more about brain health. You can help increase our knowledge by considering participation in a clinical study. Our free clinical trial matching services, TrialMatch, can help you find clinical trials in your area that are seeking volunteers.  Understanding prevention research Here are some things to keep in mind about the research underlying much of our current knowledge about possible prevention:  Insights about potentially modifiable risk factors apply to large population groups, not to individuals. Studies can show that factor X is associated with outcome Y, but cannot guarantee that any specific person will have that outcome. As a result, you can "do everything right" and still have a serious health problem or "do everything wrong" and live to be 100.  Much of our current evidence comes from large epidemiological studies such as the [place] Aging Study, the Nurses' Health Study, the Adult Changes in Thought Study and the [employer]. These studies explore pre-existing behaviors and use statistical methods to relate those behaviors to health outcomes. This type of study can show an "association" between a factor and an outcome but cannot "prove" cause and effect. This is why we describe evidence based on these studies with such language as "suggests," "may show," "might protect," and "is associated with."  The gold standard for showing cause and effect is a clinical trial in which participants are randomly assigned to a prevention or risk management strategy or a control group. Researchers follow the two groups over time to see if their outcomes differ significantly.  It is unlikely that some prevention or risk management strategies will ever be tested in randomized trials for ethical or practical reasons. One example is exercise. Definitively testing the impact of exercise on Alzheimer's risk would require a huge  trial enrolling thousands of people and following them for many years. The expense and logistics of such a trial would be prohibitive, and it would require some people to go without exercise, a known health benefit.     Mindfulness-Based Stress Reduction Mindfulness-based stress reduction (MBSR) is a program that helps people learn to practice mindfulness. Mindfulness is the practice of consciously paying attention to the present moment. MBSR focuses on developing self-awareness, which lets you respond to life stress without judgment or negative feelings. It can be learned and practiced through techniques such as education, breathing exercises, meditation, and yoga. MBSR includes several mindfulness techniques in one program. MBSR works best when you understand the treatment, are willing to try new things, and can commit to spending time practicing what you learn. MBSR training may include learning about: How your feelings, thoughts, and reactions affect your body. New ways to respond to things that cause negative thoughts to start (triggers). How to notice your thoughts and let go of them. Practicing awareness of everyday things that you normally do without thinking. The techniques and goals of different types of meditation. What are the benefits of MBSR? MBSR can have many benefits, which include helping you to: Develop self-awareness. This means knowing and understanding yourself. Learn skills and attitudes that help you to take part in your own health care. Learn new ways to care for yourself. Be more accepting about how things are, and let things go. Be  less judgmental and approach things with an open mind. Be patient with yourself and trust yourself more. MBSR has also been shown to: Reduce negative emotions, such as sadness, overwhelm, and worry. Improve memory and focus. Change how you sense and react to pain. Boost your body's ability to fight infections. Help you connect better with  other people. Improve your sense of well-being. How to practice mindfulness To do a basic awareness exercise: Find a comfortable place to sit. Pay attention to the present moment. Notice your thoughts, feelings, and surroundings just as they are. Avoid judging yourself, your feelings, or your surroundings. Make note of any judgment that comes up and let it go. Your mind may wander, and that is okay. Make note of when your thoughts drift, and return your attention to the present moment. To do basic mindfulness meditation: Find a comfortable place to sit. This may include a stable chair or a firm floor cushion. Sit upright with your back straight. Let your arms fall next to your sides, with your hands resting on your legs. If you are sitting in a chair, rest your feet flat on the floor. If you are sitting on a cushion, cross your legs in front of you. Keep your head in a neutral position with your chin dropped slightly. Relax your jaw and rest the tip of your tongue on the roof of your mouth. Drop your gaze to the floor or close your eyes. Breathe normally and pay attention to your breath. Feel the air moving in and out of your nose. Feel your belly expanding and relaxing with each breath. Your mind may wander, and that is okay. Make note of when your thoughts drift, and return your attention to your breath. Avoid judging yourself, your feelings, or your surroundings. Make note of any judgment or feelings that come up, let them go, and bring your attention back to your breath. When you are ready, lift your gaze or open your eyes. Pay attention to how your body feels after the meditation. Follow these instructions at home:  Find a local in-person or online MBSR program. Set aside some time regularly for mindfulness practice. Practice every day if you can. Even 10 minutes of practice is helpful. Find a mindfulness practice that works best for you. This may include one or more of the  following: Meditation. This involves focusing your mind on a certain thought or activity. Breathing awareness exercises. These help you to stay present by focusing on your breath. Body scan. For this practice, you lie down and pay attention to each part of your body from head to toe. You can identify tension and soreness and consciously relax parts of your body. Yoga. Yoga involves stretching and breathing, and it can improve your ability to move and be flexible. It can also help you to test your body's limits, which can help you release stress. Mindful eating. This way of eating involves focusing on the taste, texture, color, and smell of each bite of food. This slows down eating and helps you feel full sooner. For this reason, it can be an important part of a weight loss plan. Find a podcast or recording that provides guidance for breathing awareness, body scan, or meditation exercises. You can listen to these any time when you have a free moment to rest without distractions. Follow your treatment plan as told by your health care provider. This may include taking regular medicines and making changes to your diet or lifestyle as recommended. Where to  find more information You can find more information about MBSR from: Your health care provider. Community-based meditation centers or programs. Programs offered near you. Summary Mindfulness-based stress reduction (MBSR) is a program that teaches you how to consciously pay attention to the present moment. It is used to help you deal better with daily stress, feelings, and pain. MBSR focuses on developing self-awareness, which allows you to respond to life stress without judgment or negative feelings. MBSR programs may involve learning different mindfulness practices, such as breathing exercises, meditation, yoga, body scan, or mindful eating. Find a mindfulness practice that works best for you, and set aside time for it on a regular basis. This  information is not intended to replace advice given to you by your health care provider. Make sure you discuss any questions you have with your health care provider. Document Revised: 01/16/2021 Document Reviewed: 01/16/2021 Elsevier Patient Education  2024 Elsevier Inc.  [facility]  What is this medication? LECANEMAB (lek AN e mab) treats Alzheimer disease. It works by decreasing the buildup of amyloid, a protein that may cause Alzheimer disease. This may slow down the worsening of symptoms. It is a monoclonal antibody. This medicine may be used for other purposes; ask your health care provider or pharmacist if you have questions. COMMON BRAND NAME(S): LEQEMBI What should I tell my care team before I take this medication? They need to know if you have any of these conditions: An unusual or allergic reaction to lecanemab, other medications, foods, dyes, or preservatives Take medications that treat or prevent blood clots Pregnant or trying to get pregnant Breast-feeding How should I use this medication? This medication is injected into a vein. It is given by your care team in a hospital or clinic setting. A special MedGuide will be given to you before each treatment. Be sure to read this information carefully each time. Talk to your care team about the use of this medication in children. Special care may be needed. Overdosage: If you think you have taken too much of this medicine contact a poison control center or emergency room at once. NOTE: This medicine is only for you. Do not share this medicine with others. What if I miss a dose? Keep appointments for follow-up doses. It is important not to miss your dose. Call your care team if you are unable to keep an appointment. What may interact with this medication? Interactions have not been studied. This list may not describe all possible interactions. Give your health care provider a list of all the medicines, herbs, non-prescription  drugs, or dietary supplements you use. Also tell them if you smoke, drink alcohol, or use illegal drugs. Some items may interact with your medicine. What should I watch for while using this medication? Visit your care team for regular checks on your progress. Tell your care team if your symptoms do not start to get better or if they get worse. What side effects may I notice from receiving this medication? Side effects that you should report to your care team as soon as possible: Allergic reactions or angioedema--skin rash, itching or hives, swelling of the face, eyes, lips, tongue, arms, or legs, trouble swallowing or breathing Headache, worsening confusion, dizziness, change in vision, nausea, seizures Infusion reactions--chest pain, shortness of breath or trouble breathing, feeling faint or lightheaded Side effects that usually do not require medical attention (report these to your care team if they continue or are bothersome): Cough Diarrhea Headache This list may not describe all possible  side effects. Call your doctor for medical advice about side effects. You may report side effects to FDA at 1-800-FDA-1088. Where should I keep my medication? This medication is given in a hospital or clinic. It will not be stored at home. NOTE: This sheet is a summary. It may not cover all possible information. If you have questions about this medicine, talk to your doctor, pharmacist, or health care provider.  2024 Elsevier/Gold Standard (2023-05-21 00:00:00)

## 2023-08-11 NOTE — Telephone Encounter (Signed)
Pt's husband called to speak Sheryle Hail, she had left a message to call back. Send a message to [facility]; she said will call patient back from a blocked number.

## 2023-08-11 NOTE — Telephone Encounter (Signed)
Attempted to contact pt husband back, LVM rq call back to office.

## 2023-08-12 ENCOUNTER — Ambulatory Visit: Payer: PPO | Admitting: Physical Therapy

## 2023-08-12 ENCOUNTER — Encounter: Payer: Self-pay | Admitting: Physical Therapy

## 2023-08-12 ENCOUNTER — Encounter: Payer: PPO | Admitting: Physical Therapy

## 2023-08-12 DIAGNOSIS — M25511 Pain in right shoulder: Secondary | ICD-10-CM | POA: Diagnosis not present

## 2023-08-12 DIAGNOSIS — R29898 Other symptoms and signs involving the musculoskeletal system: Secondary | ICD-10-CM

## 2023-08-12 DIAGNOSIS — M6281 Muscle weakness (generalized): Secondary | ICD-10-CM

## 2023-08-12 DIAGNOSIS — M5459 Other low back pain: Secondary | ICD-10-CM

## 2023-08-12 DIAGNOSIS — G8929 Other chronic pain: Secondary | ICD-10-CM

## 2023-08-12 NOTE — Therapy (Signed)
OUTPATIENT PHYSICAL THERAPY TREATMENT   Patient Name: Tonya [name] MRN: [id number] DOB:12/18/1950, 73 y.o., female Today's Date: 08/12/2023   END OF SESSION:  PT End of Session - 08/12/23 1408     Visit Number 6    Date for PT Re-Evaluation 09/08/23    Authorization Type HTA    Progress Note Due on Visit 10    PT Start Time 1408    PT Stop Time 1502    PT Time Calculation (min) 54 min    Activity Tolerance Patient tolerated treatment well;Patient limited by pain    Behavior During Therapy [facility] for tasks assessed/performed                  Past Medical History:  Diagnosis Date   Allergy June 2012   Seasonal   Asthma 2015   Cancer [facility]) 2010   Left Breast   Coronary atherosclerosis due to calcified coronary lesion    GERD (gastroesophageal reflux disease)    Many years ago   History of breast cancer    Hyperlipidemia    Hypertension    Osteopenia    Past Surgical History:  Procedure Laterality Date   ABDOMINAL HYSTERECTOMY  2015   AUGMENTATION MAMMAPLASTY Left    BREAST BIOPSY Right 02/2019   BREAST IMPLANT EXCHANGE     COLONOSCOPY     COSMETIC SURGERY  2010   Breast cancer related   FOOT SURGERY Left 05/25/2020   MASTECTOMY Left    REDUCTION MAMMAPLASTY Right    ROTATOR CUFF REPAIR Right 07/17/2022   TONSILLECTOMY AND ADENOIDECTOMY  1958   Patient Active Problem List   Diagnosis Date Noted   MCI (mild cognitive impairment) 05/31/2023   Alzheimer's disease, unspecified (CODE) (HCC) 05/31/2023   Persistent disorder of initiating or maintaining sleep 04/13/2023   Short-term memory loss 03/01/2023   History of breast cancer 03/01/2023   Acute bursitis of right shoulder 04/21/2022   Other bursal cyst, left ankle and foot 04/21/2022   Coronary artery disease due to lipid rich plaque 09/30/2021   Hepatic steatosis 09/30/2021   Adenoma of left adrenal gland 09/30/2021   Strain of left trapezius muscle 09/30/2021   Hyperlipidemia 05/19/2021    Somatic dysfunction of spine, thoracic 03/26/2021   Chronic left-sided thoracic back pain 12/30/2020   DCIS (ductal carcinoma in situ) 01/26/2020   Low back pain 01/26/2020   Chronic pain of both knees 09/19/2019   Osteopenia    Estrogen deficiency 04/14/2018   Essential hypertension 01/10/2018   Gastroesophageal reflux disease 01/10/2018   Mild intermittent asthma without complication 01/10/2018   Pain in left lower leg 03/03/2017   Family history of colon cancer 02/23/2017   Herpes simplex vulvovaginitis 02/18/2017   Neuropathy 02/18/2017   Elevated liver enzymes 02/17/2017   Need for Streptococcus pneumoniae vaccination 02/17/2017   Chondromalacia of left patella 12/28/2016   Synovitis of left knee 12/28/2016   Seasonal allergic rhinitis due to fungal spores 09/25/2016   Bone mass 09/24/2016   Chronic hip pain 09/24/2016   Dark yellow-colored urine 09/24/2016   Low oxygen saturation 09/24/2016   Other specified postprocedural states 01/17/2015   Abnormal stress electrocardiogram test 10/20/2013   Breast mass 12/11/2010   Family history of malignant neoplasm of breast 12/11/2010    PCP: Sharlene Dory, DO   REFERRING PROVIDER: Sharlene Dory, DO  REFERRING DIAG: [phone] (ICD-10-CM) - Bilateral shoulder pain, unspecified chronicity   THERAPY DIAG:  Chronic right shoulder pain  Chronic left shoulder  pain  Muscle weakness (generalized)  Other low back pain  Other symptoms and signs involving the musculoskeletal system  RATIONALE FOR EVALUATION AND TREATMENT: Rehabilitation  ONSET DATE: chronic- had initial rotator cuff surgery January 2024  NEXT MD VISIT: Referring PRN    SUBJECTIVE:                                                                                                                                                                                                         SUBJECTIVE STATEMENT: Increased pain today but unsure of  trigger. Upper back bothering her more as well today, but no pain in L shoulder.  EVAL:  I'm having issues with both shoulders, R>L. Had R rotator cuff repair in the past (January 2024), that one hurts worse and now the left is hurting because I have started using my left arm more. Upper back is sore if I take a deep breath and if I shrug my shoulders. Feels like my right shoulder blade is "out". Muscle directly below my shoulder blade can go numb sometimes. Both arms ache at times all the way down to elbows and sometimes forearms. Feel very weak. Having some issues with my legs as well, I hurt everywhere in my body. When I try to reach across myself I get a bad pinch in my shoulder.   PAIN: Are you having pain? Yes: NPRS scale: 7/10  Pain location: R shoulder/upper arm  Pain description: sharp and sore  Aggravating factors: movement in general  Relieving factors: not moving, ice, heat   PERTINENT HISTORY:  R RCR 07/20/22, osteopenia, L breast cancer, HTN, CAD, Alzheimer's dementia, LBP, sciatica, neuropathy, fatigue  PRECAUTIONS: None  RED FLAGS: None  HAND DOMINANCE: Right  WEIGHT BEARING RESTRICTIONS: No  FALLS:  Has patient fallen in last 6 months? No  LIVING ENVIRONMENT: Lives with: lives with their spouse Lives in: House/apartment  OCCUPATION: retired - used to work in Automotive engineer  PLOF: Independent, Independent with basic ADLs, Independent with gait, and Independent with transfers  PATIENT GOALS:  address pain, be able to do more, be able to exercise to lose weight/tolerate exercise better    OBJECTIVE: (objective measures completed at initial evaluation unless otherwise dated)  DIAGNOSTIC FINDINGS:  06/11/22 - R shoulder MRI: IMPRESSION: 1. Severe tendinosis of the supraspinatus tendon with a large full-thickness, near complete, tear with 3.8 cm of retraction and a few intact anterior most fibers. 2. Moderate tendinosis of the infraspinatus tendon with a  tiny interstitial tear. 3. Mild tendinosis of the intra-articular portion of the long  head of the biceps tendon. 04/21/22 - DG R shoulder: IMPRESSION: No fracture or dislocation of the right shoulder. Mild acromioclavicular and glenohumeral arthrosis.  PATIENT SURVEYS:  Quick Dash 59.1%  COGNITION: Overall cognitive status: History of cognitive impairments - at baseline mild cognitive impairment at baseline per chart    POSTURE: rounded shoulders, forward head, and increased thoracic kyphosis  UPPER EXTREMITY ROM:   Active ROM Right eval Left eval  Shoulder flexion 145 160  Shoulder extension    Shoulder abduction 145 170  Shoulder adduction    Shoulder internal rotation L4  T7  Shoulder external rotation C4 C7  Elbow flexion    Elbow extension    Wrist flexion    Wrist extension    Wrist ulnar deviation    Wrist radial deviation    Wrist pronation    Wrist supination    (Blank rows = not tested)  UPPER/LOWER EXTREMITY MMT:  MMT Right eval Left eval  Shoulder flexion 3+ 4  Shoulder extension    Shoulder abduction 3 4  Shoulder adduction    Shoulder internal rotation 4 4  Shoulder external rotation 3 4  Middle trapezius    Lower trapezius    Elbow flexion    Elbow extension    Wrist flexion    Wrist extension    Wrist ulnar deviation    Wrist radial deviation    Wrist pronation    Wrist supination    Grip strength (lbs)    Hip flexion  5 5  Quads  5 5  Hamstrings  4+ 4+  Ankle dorsiflexion  5 5  (Blank rows = not tested)  PALPATION:  Extremely tight in all paraspinals, upper traps, R anterior shoulder and biceps, suboccipitals     TODAY'S TREATMENT:   08/12/23 THERAPEUTIC EXERCISE: To improve strength, endurance, and ROM.  Demonstration, verbal and tactile cues throughout for technique.  UBE - L2.0 x 6 min (4' fwd & 2' back) - more uncomfortable with backwards Review of positional pectoralis/postural stretches and thoracic mobilization using pool  noodle/towel roll in hooklying  MANUAL THERAPY: To promote normalized muscle tension, improved flexibility, improved joint mobility, increased ROM, and reduced pain utilizing joint mobilization, connective tissue massage, therapeutic massage, and manual TP therapy.  STM/DTM and manual TPR to R pec minor and major, R anterior deltoid and biceps, R teres group, R subscapularis and rhomboids/lower traps R scapular mobilization - emphasis on retraction and depression  Provided instruction in self-STM techniques to R anterior and posterior shoulder complex using tennis ball on wall - ball placed in pillowcase (or sock) to assist with positioning for posterior shoulder.   NEUROMUSCULAR RE-EDUCATION: To improve kinesthesia and posture.  Kinesiotaping for R shoulder impingement, deltoid inhibition and to promote improved posture and scapular engagement - 3 Y strips - 1st from lateral shoulder along spine of scapula, 2nd from distal to deltoid wrapping ant & post to top of shoulder, 3rd from pecs to lateral shoulder.  Wearing and removal instructions provided to patient.   08/09/23 THERAPEUTIC EXERCISE: To improve strength and flexibility.  Demonstration, verbal and tactile cues throughout for technique.  UBE - L1.5 x 6 min (3' fwd 2 back) Supine chin tucks x 20 Supine shoulder diagonals YTB x 10 each way Supine shoulder ER YTB- p! Standing rows and ext RTB- review  MANUAL THERAPY: To promote normalized muscle tension, improved flexibility, improved joint mobility, increased ROM, and reduced pain utilizing joint mobilization, connective tissue massage, therapeutic massage, and manual TP  therapy. STM/DTM and manual TPR to bil  L LS and rhomboids, suboccipital release   08/04/23 THERAPEUTIC EXERCISE: To improve strength and flexibility.  Demonstration, verbal and tactile cues throughout for technique.  UBE - L1.5 x 6 min (3' fwd & back) - pt reports more pain in anterior upper arm with backwards  motion Seated R anterior deltoid/biceps stretch 2 x 30" (shoulder extended with long-arm ER and hand facing back on mat table + fwd trunk weight shift and slight L rotation) Seated scap retraction into pool noodle + RTB B shoulder horiz ABD 2 x 10 Seated scap retraction into pool noodle + RTB B shoulder ER 2 x 10  MANUAL THERAPY: To promote normalized muscle tension, improved flexibility, improved joint mobility, increased ROM, and reduced pain utilizing joint mobilization, connective tissue massage, therapeutic massage, and manual TP therapy. STM/DTM and manual TPR to R anterior deltoid and biceps, L LS and rhomboids R/L scapular mobilization - emphasis on retraction and depression    PATIENT EDUCATION:  Education details: HEP update, postural awareness, self-STM techniques to R shoulder complex using tennis ball on wall, and Ktape wearing and removal instructions  Person educated: Patient Education method: Explanation, Demonstration, Tactile cues, Verbal cues, and Handouts Education comprehension: verbalized understanding, returned demonstration, verbal cues required, tactile cues required, and needs further education  HOME EXERCISE PROGRAM: Access Code: [email or web address] URL: [email or web address] Date: 08/12/2023 Prepared by: Glenetta Hew  Exercises - Seated Gentle Upper Trapezius Stretch  - 2 x daily - 7 x weekly - 3 reps - 30 sec hold - Gentle Levator Scapulae Stretch (Mirrored)  - 2 x daily - 7 x weekly - 3 reps - 30 sec hold - Standing Bilateral Low Shoulder Row with Anchored Resistance  - 1 x daily - 7 x weekly - 2 sets - 10 reps - 5 sec hold - Scapular Retraction with Resistance Advanced  - 1 x daily - 7 x weekly - 2 sets - 10 reps - 5 sec hold - Seated Thoracic Lumbar Extension  - 1 x daily - 7 x weekly - 2 sets - 10 reps - Seated Cervical Retraction  - 1 x daily - 7 x weekly - 2 sets - 10 reps - Bicep Stretch at Table  - 2 x daily - 7 x weekly - 2 sets - 10 reps - 3 sec  hold - Doorway Pec Stretch at 60 Degrees Abduction with Arm Straight  - 2 x daily - 7 x weekly - 3 reps - 30 sec hold - Supine Thoracic Mobilization Towel Roll Vertical with Arm Stretch  - 1 x daily - 7 x weekly - 2 sets - 10 reps - 3 sec hold - Standing Pectoral Release with Ball at Wall  - 1-2 x daily - 7 x weekly - 1-2 min hold - Standing Infraspinatus/Teres Minor Release with Ball at [employer]  - 1-2 x daily - 7 x weekly - 1-2 min hold  Patient Education - Kinesiology tape   ASSESSMENT:  CLINICAL IMPRESSION: Finleigh reports she gets relief from manual therapy performed during PT treatment sessions with temporary improvement in her R shoulder ROM and reduction in her pain, however after a while the pain seems to come back along with the ROM limitations.  She is demonstrating improving postural awareness but does still have a mildly protracted R scapula with slight rounding forward of her shoulder.  Continued increased muscle tension/taut bands/TPs identified in several muscles in R shoulder complex, which were addressed with MT including  STM/DTM and manual TPR.  Instruction provided in self-STM techniques to R shoulder complex muscles using tennis ball on the home for home performance.  Concluded session with application of kinesiotape to address probable R shoulder impingement by facilitating deltoid and pectoralis inhibition while promoting improved posture and scapular engagement.  Guida will benefit from continued skilled PT to address ongoing abnormal muscle tension, ROM and strength deficits to improve mobility and activity tolerance with decreased pain interference.     OBJECTIVE IMPAIRMENTS: Abnormal gait, decreased activity tolerance, decreased balance, decreased mobility, difficulty walking, decreased ROM, decreased strength, increased fascial restrictions, impaired perceived functional ability, increased muscle spasms, impaired UE functional use, postural dysfunction, and pain.   ACTIVITY  LIMITATIONS: carrying, lifting, standing, squatting, stairs, transfers, reach over head, locomotion level, and caring for others  PARTICIPATION LIMITATIONS: meal prep, cleaning, laundry, driving, shopping, community activity, and yard work  PERSONAL FACTORS: Age, Behavior pattern, Education, Fitness, Past/current experiences, Social background, and Time since onset of injury/illness/exacerbation are also affecting patient's functional outcome.   REHAB POTENTIAL: Fair ongoing issues with pain since surgery January 2024, gross deconditioning, not compliant with HEP since DC from PT   CLINICAL DECISION MAKING: Stable/uncomplicated  EVALUATION COMPLEXITY: Low   GOALS: Goals reviewed with patient? Yes  SHORT TERM GOALS: Target date: 08/11/2023    Patient will be independent with initial HEP to improve outcomes and carryover.  Baseline:  Goal status: MET - 08/02/23  2.  Patient will report 25% improvement in B shoulder pain to improve QOL.   Baseline:  Goal status: IN PROGRESS  3.  Will be able to dress/perform all self care activities without increased pain in neck and shoulders. Baseline:  Goal status: IN PROGRESS - 08/12/23 - Patient continues to have difficulty with donning/doffing shirts overhead due to pain in the shoulder and upper back.  LONG TERM GOALS: Target date: 09/08/2023   Patient will be independent with ongoing/advanced HEP for self-management at home.  Baseline:  Goal status: IN PROGRESS  2.  Patient will report 50-75% improvement in B shoulder pain to improve QOL.  Baseline:  Goal status: IN PROGRESS  3.  Patient to demonstrate improved upright posture with posterior shoulder girdle engaged to promote improved glenohumeral joint mobility. Baseline:  Goal status: IN PROGRESS  4.  R shoulder AROM to be equal to L without pain provocation to allow for increased ease of ADLs.  Baseline:  Goal status: IN PROGRESS  5.  MMT will have improved by at least one grade  in all weak groups  Goal status: IN PROGRESS  6  QuickDASH to have improved by at least 15 points to show improved QOL/subjective improvement  Baseline: 59.1% Goal status: IN PROGRESS  7.  Patient will experience at least a 50% improvement in sciatica and low back discomfort    Baseline:  Goal status: IN PROGRESS   8. Will be able to perform exercise routine as desired without increase in pain in shoulders/back/LEs  Baseline:  Goal status: IN PROGRESS   PLAN:  PT FREQUENCY: 1-2x/week  PT DURATION: 8 weeks  PLANNED INTERVENTIONS: 97164- PT Re-evaluation, 97110-Therapeutic exercises, 97530- Therapeutic activity, [id number]- Neuromuscular re-education, 97535- Self Care, 53664- Manual therapy, [id number]- Aquatic Therapy, 97014- Electrical stimulation (unattended), 97033- Ionotophoresis 4mg /ml Dexamethasone, Taping, Dry Needling, Cryotherapy, and Moist heat  PLAN FOR NEXT SESSION:  Continue STG assessment; assess response to taping; shoulder ROM and strength/postural strength - reapplication of Kinesiotape as benefit noted to promote normalization of muscle activation and improved alignment, proximal  and core strength and LE stretching as appropriate, general conditioning. Does not want dry needling.  Might benefit from water PT if progress is slow in land PT and she is agreeable, has been to [facility] before.    Marry Guan, PT  08/12/23 3:11 PM

## 2023-08-13 ENCOUNTER — Ambulatory Visit: Payer: PPO | Admitting: Adult Health

## 2023-08-13 ENCOUNTER — Encounter: Payer: Self-pay | Admitting: Adult Health

## 2023-08-13 VITALS — BP 119/77 | HR 72 | Ht 67.0 in | Wt 206.4 lb

## 2023-08-13 DIAGNOSIS — G3184 Mild cognitive impairment, so stated: Secondary | ICD-10-CM | POA: Diagnosis not present

## 2023-08-13 DIAGNOSIS — G309 Alzheimer's disease, unspecified: Secondary | ICD-10-CM | POA: Diagnosis not present

## 2023-08-13 NOTE — Patient Instructions (Addendum)
Your Plan:  Will plan on repeat memory testing in 3 months - if at that time your results are within range of mild cognitive impairment, we will be able to pursue infusions. Currently, your memory test scores are too high and insurance will decline coverage   If anything should significantly change before the next visit, please let me know      Thank you for coming to see [place] at [facility]. I hope we have been able to provide you high quality care today.  You may receive a patient satisfaction survey over the next few weeks. We would appreciate your feedback and comments so that we may continue to improve ourselves and the health of our patients.

## 2023-08-13 NOTE — Progress Notes (Signed)
[facility] [address] [place]. [place] [address] [phone]       OFFICE FOLLOW UP NOTE  Ms. Shari Prows Date of Birth:  Sep 24, 1950 Medical Record Number:  [id number]    Primary neurologist: Daphane Shepherd Reason for visit: Alzheimer's disease    SUBJECTIVE:  CHIEF COMPLAINT:  Chief Complaint  Patient presents with   Follow-up    Rm 8, alone.      Follow-up visit:  Prior visit: 08/11/2023 with Dr. Vickey Huger  Brief HPI:   Tonya [name] is a 73 y.o. female who was evaluated by Dr. Vickey Huger for memory concerns over the past year.  Noted cognitive changes associated with language difficulties. MOCA 22/30 at initial visit per initial consult note. MRI brain unremarkable.  PET scan 03/2023 consistent with Alzheimer's disease.  Alzheimer's biomarkers also consistent with AD pathology (beta amyloid ratio 0.096, p-tau181 2.54).  APOE E3/E3.  Neurocognitive testing confirms somatic aphasia and MCI.  Discussed initiating Leqembi at f/u visit with Dr. Vickey Huger in 05/2023. MOCA at initial visit 02/2023 documented in note at 22/30 but for unclear reason, MOCA is currently documented at 29/30 from same date. Due to essentially normal score, insurance denied Leqembi infusions.   She was recently seen by Dr. Vickey Huger via MyChart video visit and discussed starting Leqembi. Family/patient reporting decline in memory. Patient interested in proceeding but needed in office visit to repeat MOCA.    Interval history:  Returns for repeat MOCA testing unaccompanied (husband waiting in lobby). MOCA today 26/30 with deficits in attention, language, abstraction and recall.  She does report worsening memory symptoms later in the evening usually associated with fatigue.  Her cognition can also be affected by increased stress or anxiety.  Continues to have difficulty with language.      ROS:   14 system review of systems performed and negative with exception of those listed in  HPI  PMH:  Past Medical History:  Diagnosis Date   Allergy June 2012   Seasonal   Asthma 2015   Cancer [facility]) 2010   Left Breast   Coronary atherosclerosis due to calcified coronary lesion    GERD (gastroesophageal reflux disease)    Many years ago   History of breast cancer    Hyperlipidemia    Hypertension    Osteopenia     PSH:  Past Surgical History:  Procedure Laterality Date   ABDOMINAL HYSTERECTOMY  2015   AUGMENTATION MAMMAPLASTY Left    BREAST BIOPSY Right 02/2019   BREAST IMPLANT EXCHANGE     COLONOSCOPY     COSMETIC SURGERY  2010   Breast cancer related   FOOT SURGERY Left 05/25/2020   MASTECTOMY Left    REDUCTION MAMMAPLASTY Right    ROTATOR CUFF REPAIR Right 07/17/2022   TONSILLECTOMY AND ADENOIDECTOMY  1958    Social History:  Social History   Socioeconomic History   Marital status: Married    Spouse name: Not on file   Number of children: 1   Years of education: Not on file   Highest education level: Bachelor's degree (e.g., BA, AB, BS)  Occupational History   Not on file  Tobacco Use   Smoking status: Never   Smokeless tobacco: Never  Vaping Use   Vaping status: Never Used  Substance and Sexual Activity   Alcohol use: Yes    Comment: rarely   Drug use: Never   Sexual activity: Yes    Birth control/protection: None  Other Topics Concern   Not on  file  Social History Narrative   Not on file   Social Drivers of Health   Financial Resource Strain: Low Risk  (06/29/2023)   Overall Financial Resource Strain (CARDIA)    Difficulty of Paying Living Expenses: Not hard at all  Food Insecurity: No Food Insecurity (06/29/2023)   Hunger Vital Sign    Worried About Running Out of Food in the Last Year: Never true    Ran Out of Food in the Last Year: Never true  Transportation Needs: No Transportation Needs (06/29/2023)   PRAPARE - Administrator, Civil Service (Medical): No    Lack of Transportation (Non-Medical): No  Physical  Activity: Inactive (06/29/2023)   Exercise Vital Sign    Days of Exercise per Week: 0 days    Minutes of Exercise per Session: 20 min  Stress: No Stress Concern Present (06/29/2023)   [employer] of Occupational Health - Occupational Stress Questionnaire    Feeling of Stress : Only a little  Social Connections: Moderately Isolated (06/29/2023)   Social Connection and Isolation Panel [NHANES]    Frequency of Communication with Friends and Family: More than three times a week    Frequency of Social Gatherings with Friends and Family: Once a week    Attends Religious Services: Never    Database administrator or Organizations: No    Attends Banker Meetings: Never    Marital Status: Married  Catering manager Violence: Not At Risk (11/19/2022)   Humiliation, Afraid, Rape, and Kick questionnaire    Fear of Current or Ex-Partner: No    Emotionally Abused: No    Physically Abused: No    Sexually Abused: No    Family History:  Family History  Problem Relation Age of Onset   Heart attack Mother    Arthritis Mother    Early death Mother    Heart attack Father        died from   Early death Father    Colon cancer Brother 45 - 43   Lung disease Brother    Early death Brother    Cancer Brother    Cancer Brother    Obesity Brother    Breast cancer Maternal Aunt    Breast cancer Paternal Aunt    Colon cancer Cousin 30 - 35   Esophageal cancer Neg Hx    Rectal cancer Neg Hx    Stomach cancer Neg Hx     Medications:   Current Outpatient Medications on File Prior to Visit  Medication Sig Dispense Refill   aspirin EC (ASPIRIN LOW DOSE) 81 MG tablet TAKE 1 TABLET (81 MG TOTAL) BY MOUTH DAILY. SWALLOW WHOLE. 90 tablet 3   atenolol (TENORMIN) 50 MG tablet TAKE 1 TABLET BY MOUTH EVERY DAY 90 tablet 3   calcium-vitamin D (OSCAL WITH D) 250-125 MG-UNIT tablet Take 1 tablet by mouth daily.     Coenzyme Q10 (COQ10 PO) Take 300 mg by mouth daily.     losartan (COZAAR) 50 MG tablet  TAKE 1 TABLET BY MOUTH EVERY DAY 90 tablet 1   lovastatin (MEVACOR) 10 MG tablet Take 1 tablet (10 mg total) by mouth at bedtime. 90 tablet 1   montelukast (SINGULAIR) 10 MG tablet TAKE 1 TABLET BY MOUTH EVERYDAY AT BEDTIME 90 tablet 3   Multiple Vitamins-Minerals (MULTIVITAMIN ADULTS 50+) TABS Take by mouth.     pantoprazole (PROTONIX) 20 MG tablet TAKE 1 TABLET BY MOUTH EVERY DAY 90 tablet 0   No  current facility-administered medications on file prior to visit.    Allergies:   Allergies  Allergen Reactions   Crestor [Rosuvastatin]     Myalgias    Lipitor [Atorvastatin]     myalgias      OBJECTIVE:  Physical Exam  Vitals:   08/13/23 1108  BP: 119/77  Pulse: 72  Weight: 206 lb 6.4 oz (93.6 kg)  Height: 5\' 7"  (1.702 m)   Body mass index is 32.33 kg/m. No results found.   General: well developed, well nourished, very pleasant elderly Caucasian female, seated, in no evident distress  Neurologic Exam Mental Status: Awake and fully alert.  Occasional word finding difficulty and speech hesitancy.  Oriented to place and time. Recent memory impaired and remote memory intact. Attention span, concentration and fund of knowledge appropriate during visit. Mood and affect appropriate.  Cranial Nerves: Pupils equal, briskly reactive to light. Extraocular movements full without nystagmus. Visual fields full to confrontation. Hearing intact. Facial sensation intact. Face, tongue, palate moves normally and symmetrically.  Motor: Normal bulk and tone. Normal strength in all tested extremity muscles Gait and Station: Arises from chair without difficulty. Stance is normal. Gait demonstrates normal stride length and balance without use of AD.       08/13/2023   11:13 AM 03/01/2023    2:08 PM  Montreal Cognitive Assessment   Visuospatial/ Executive (0/5) 5 5  Naming (0/3) 3 3  Attention: Read list of digits (0/2) 1 2  Attention: Read list of letters (0/1) 1 1  Attention: Serial 7  subtraction starting at 100 (0/3) 3 3  Language: Repeat phrase (0/2) 2 2  Language : Fluency (0/1) 0 0  Abstraction (0/2) 1 2  Delayed Recall (0/5) 4 5  Orientation (0/6) 6 6  Total 26 29         ASSESSMENT/PLAN: TORRE PIKUS is a 73 y.o. year old female      MCI due to Alzheimer's disease:  MOCA today 26/30  In order to be eligible for Leqembi, MOCA score needs to be between 18-25 showing mild cognitive impairment, she questions paying for Leqembi out of pocket, will follow up with infusion suite regarding the out of pocket cost and will update patient via MyChart ATN profile beta amyloid ratio 0.096, p-tau181 2.54 APOE E3/E3, negative for APO E4 variant PET scan 03/2023 positive amyloid with moderate to frequent neuritic beta amyloid plaques MR brain 02/2023 unremarkable     Follow up in 3 months for repeat MOCA or call earlier if needed   CC:  PCP: Sharlene Dory, DO    I spent 25 minutes of face-to-face and non-face-to-face time with patient.  This included previsit chart review, lab review, study review, order entry, electronic health record documentation, patient education and discussion regarding above diagnoses and treatment plan and answered all other questions to patient's satisfaction  Ihor Austin, [facility]  [facility] [address] [place], [place] [address]  Phone [phone] Fax [phone] Note: This document was prepared with digital dictation and possible smart phrase technology. Any transcriptional errors that result from this process are unintentional.

## 2023-08-16 ENCOUNTER — Ambulatory Visit: Payer: PPO

## 2023-08-16 DIAGNOSIS — M5459 Other low back pain: Secondary | ICD-10-CM

## 2023-08-16 DIAGNOSIS — M25511 Pain in right shoulder: Secondary | ICD-10-CM | POA: Diagnosis not present

## 2023-08-16 DIAGNOSIS — G8929 Other chronic pain: Secondary | ICD-10-CM

## 2023-08-16 DIAGNOSIS — R29898 Other symptoms and signs involving the musculoskeletal system: Secondary | ICD-10-CM

## 2023-08-16 DIAGNOSIS — M6281 Muscle weakness (generalized): Secondary | ICD-10-CM

## 2023-08-16 NOTE — Therapy (Signed)
 OUTPATIENT PHYSICAL THERAPY TREATMENT   Patient Name: Tonya [name] MRN: [id number] DOB:19-Nov-1950, 73 y.o., female Today's Date: 08/16/2023   END OF SESSION:  PT End of Session - 08/16/23 1322     Visit Number 7    Date for PT Re-Evaluation 09/08/23    Authorization Type HTA    Progress Note Due on Visit 10    PT Start Time 1315    PT Stop Time 1400    PT Time Calculation (min) 45 min    Activity Tolerance Patient tolerated treatment well    Behavior During Therapy [facility] for tasks assessed/performed                   Past Medical History:  Diagnosis Date   Allergy June 2012   Seasonal   Asthma 2015   Cancer [facility]) 2010   Left Breast   Coronary atherosclerosis due to calcified coronary lesion    GERD (gastroesophageal reflux disease)    Many years ago   History of breast cancer    Hyperlipidemia    Hypertension    Osteopenia    Past Surgical History:  Procedure Laterality Date   ABDOMINAL HYSTERECTOMY  2015   AUGMENTATION MAMMAPLASTY Left    BREAST BIOPSY Right 02/2019   BREAST IMPLANT EXCHANGE     COLONOSCOPY     COSMETIC SURGERY  2010   Breast cancer related   FOOT SURGERY Left 05/25/2020   MASTECTOMY Left    REDUCTION MAMMAPLASTY Right    ROTATOR CUFF REPAIR Right 07/17/2022   TONSILLECTOMY AND ADENOIDECTOMY  1958   Patient Active Problem List   Diagnosis Date Noted   MCI (mild cognitive impairment) 05/31/2023   Alzheimer's disease, unspecified (CODE) (HCC) 05/31/2023   Persistent disorder of initiating or maintaining sleep 04/13/2023   Short-term memory loss 03/01/2023   History of breast cancer 03/01/2023   Acute bursitis of right shoulder 04/21/2022   Other bursal cyst, left ankle and foot 04/21/2022   Coronary artery disease due to lipid rich plaque 09/30/2021   Hepatic steatosis 09/30/2021   Adenoma of left adrenal gland 09/30/2021   Strain of left trapezius muscle 09/30/2021   Hyperlipidemia 05/19/2021   Somatic dysfunction of  spine, thoracic 03/26/2021   Chronic left-sided thoracic back pain 12/30/2020   DCIS (ductal carcinoma in situ) 01/26/2020   Low back pain 01/26/2020   Chronic pain of both knees 09/19/2019   Osteopenia    Estrogen deficiency 04/14/2018   Essential hypertension 01/10/2018   Gastroesophageal reflux disease 01/10/2018   Mild intermittent asthma without complication 01/10/2018   Pain in left lower leg 03/03/2017   Family history of colon cancer 02/23/2017   Herpes simplex vulvovaginitis 02/18/2017   Neuropathy 02/18/2017   Elevated liver enzymes 02/17/2017   Need for Streptococcus pneumoniae vaccination 02/17/2017   Chondromalacia of left patella 12/28/2016   Synovitis of left knee 12/28/2016   Seasonal allergic rhinitis due to fungal spores 09/25/2016   Bone mass 09/24/2016   Chronic hip pain 09/24/2016   Dark yellow-colored urine 09/24/2016   Low oxygen saturation 09/24/2016   Other specified postprocedural states 01/17/2015   Abnormal stress electrocardiogram test 10/20/2013   Breast mass 12/11/2010   Family history of malignant neoplasm of breast 12/11/2010    PCP: Sharlene Dory, DO   REFERRING PROVIDER: Sharlene Dory, DO  REFERRING DIAG: [phone] (ICD-10-CM) - Bilateral shoulder pain, unspecified chronicity   THERAPY DIAG:  Chronic right shoulder pain  Chronic left shoulder pain  Muscle weakness (generalized)  Other low back pain  Other symptoms and signs involving the musculoskeletal system  RATIONALE FOR EVALUATION AND TREATMENT: Rehabilitation  ONSET DATE: chronic- had initial rotator cuff surgery January 2024  NEXT MD VISIT: Referring PRN    SUBJECTIVE:                                                                                                                                                                                                         SUBJECTIVE STATEMENT: PT applied tape last visit, it seemed to help. Having pain  at the base of her neck and notes pinching along her R scapula  EVAL:  I'm having issues with both shoulders, R>L. Had R rotator cuff repair in the past (January 2024), that one hurts worse and now the left is hurting because I have started using my left arm more. Upper back is sore if I take a deep breath and if I shrug my shoulders. Feels like my right shoulder blade is "out". Muscle directly below my shoulder blade can go numb sometimes. Both arms ache at times all the way down to elbows and sometimes forearms. Feel very weak. Having some issues with my legs as well, I hurt everywhere in my body. When I try to reach across myself I get a bad pinch in my shoulder.   PAIN: Are you having pain? Yes: NPRS scale: 5/10  Pain location: R shoulder/upper arm; base of skull  Pain description: sharp and sore  Aggravating factors: movement in general  Relieving factors: not moving, ice, heat   PERTINENT HISTORY:  R RCR 07/20/22, osteopenia, L breast cancer, HTN, CAD, Alzheimer's dementia, LBP, sciatica, neuropathy, fatigue  PRECAUTIONS: None  RED FLAGS: None  HAND DOMINANCE: Right  WEIGHT BEARING RESTRICTIONS: No  FALLS:  Has patient fallen in last 6 months? No  LIVING ENVIRONMENT: Lives with: lives with their spouse Lives in: House/apartment  OCCUPATION: retired - used to work in Automotive engineer  PLOF: Independent, Independent with basic ADLs, Independent with gait, and Independent with transfers  PATIENT GOALS:  address pain, be able to do more, be able to exercise to lose weight/tolerate exercise better    OBJECTIVE: (objective measures completed at initial evaluation unless otherwise dated)  DIAGNOSTIC FINDINGS:  06/11/22 - R shoulder MRI: IMPRESSION: 1. Severe tendinosis of the supraspinatus tendon with a large full-thickness, near complete, tear with 3.8 cm of retraction and a few intact anterior most fibers. 2. Moderate tendinosis of the infraspinatus tendon with a tiny  interstitial tear. 3. Mild tendinosis of the intra-articular  portion of the long head of the biceps tendon. 04/21/22 - DG R shoulder: IMPRESSION: No fracture or dislocation of the right shoulder. Mild acromioclavicular and glenohumeral arthrosis.  PATIENT SURVEYS:  Quick Dash 59.1%  COGNITION: Overall cognitive status: History of cognitive impairments - at baseline mild cognitive impairment at baseline per chart    POSTURE: rounded shoulders, forward head, and increased thoracic kyphosis  UPPER EXTREMITY ROM:   Active ROM Right eval Left eval  Shoulder flexion 145 160  Shoulder extension    Shoulder abduction 145 170  Shoulder adduction    Shoulder internal rotation L4  T7  Shoulder external rotation C4 C7  Elbow flexion    Elbow extension    Wrist flexion    Wrist extension    Wrist ulnar deviation    Wrist radial deviation    Wrist pronation    Wrist supination    (Blank rows = not tested)  UPPER/LOWER EXTREMITY MMT:  MMT Right eval Left eval  Shoulder flexion 3+ 4  Shoulder extension    Shoulder abduction 3 4  Shoulder adduction    Shoulder internal rotation 4 4  Shoulder external rotation 3 4  Middle trapezius    Lower trapezius    Elbow flexion    Elbow extension    Wrist flexion    Wrist extension    Wrist ulnar deviation    Wrist radial deviation    Wrist pronation    Wrist supination    Grip strength (lbs)    Hip flexion  5 5  Quads  5 5  Hamstrings  4+ 4+  Ankle dorsiflexion  5 5  (Blank rows = not tested)  PALPATION:  Extremely tight in all paraspinals, upper traps, R anterior shoulder and biceps, suboccipitals     TODAY'S TREATMENT:  08/16/23 THERAPEUTIC EXERCISE: To improve strength, endurance, and ROM.  Demonstration, verbal and tactile cues throughout for technique.  UBE - L2.0 x 6 min (3' fwd & 3' back) - more uncomfortable with backwards SNAG for cervical extension and rotation x 10 bil Standing thoracic ext x 10 Seated rows 10lb  high grips x 10  MANUAL THERAPY: Suboccipital release, TPR/STM to to UT, LS, rhomboids bil  08/12/23 THERAPEUTIC EXERCISE: To improve strength, endurance, and ROM.  Demonstration, verbal and tactile cues throughout for technique.  UBE - L2.0 x 6 min (4' fwd & 2' back) - more uncomfortable with backwards Review of positional pectoralis/postural stretches and thoracic mobilization using pool noodle/towel roll in hooklying  MANUAL THERAPY: To promote normalized muscle tension, improved flexibility, improved joint mobility, increased ROM, and reduced pain utilizing joint mobilization, connective tissue massage, therapeutic massage, and manual TP therapy.  STM/DTM and manual TPR to R pec minor and major, R anterior deltoid and biceps, R teres group, R subscapularis and rhomboids/lower traps R scapular mobilization - emphasis on retraction and depression  Provided instruction in self-STM techniques to R anterior and posterior shoulder complex using tennis ball on wall - ball placed in pillowcase (or sock) to assist with positioning for posterior shoulder.   NEUROMUSCULAR RE-EDUCATION: To improve kinesthesia and posture.  Kinesiotaping for R shoulder impingement, deltoid inhibition and to promote improved posture and scapular engagement - 3 Y strips - 1st from lateral shoulder along spine of scapula, 2nd from distal to deltoid wrapping ant & post to top of shoulder, 3rd from pecs to lateral shoulder.  Wearing and removal instructions provided to patient.   08/09/23 THERAPEUTIC EXERCISE: To improve strength and flexibility.  Demonstration, verbal and tactile cues throughout for technique.  UBE - L1.5 x 6 min (3' fwd 2 back) Supine chin tucks x 20 Supine shoulder diagonals YTB x 10 each way Supine shoulder ER YTB- p! Standing rows and ext RTB- review  MANUAL THERAPY: To promote normalized muscle tension, improved flexibility, improved joint mobility, increased ROM, and reduced pain utilizing joint  mobilization, connective tissue massage, therapeutic massage, and manual TP therapy. STM/DTM and manual TPR to bil  L LS and rhomboids, suboccipital release   08/04/23 THERAPEUTIC EXERCISE: To improve strength and flexibility.  Demonstration, verbal and tactile cues throughout for technique.  UBE - L1.5 x 6 min (3' fwd & back) - pt reports more pain in anterior upper arm with backwards motion Seated R anterior deltoid/biceps stretch 2 x 30" (shoulder extended with long-arm ER and hand facing back on mat table + fwd trunk weight shift and slight L rotation) Seated scap retraction into pool noodle + RTB B shoulder horiz ABD 2 x 10 Seated scap retraction into pool noodle + RTB B shoulder ER 2 x 10  MANUAL THERAPY: To promote normalized muscle tension, improved flexibility, improved joint mobility, increased ROM, and reduced pain utilizing joint mobilization, connective tissue massage, therapeutic massage, and manual TP therapy. STM/DTM and manual TPR to R anterior deltoid and biceps, L LS and rhomboids R/L scapular mobilization - emphasis on retraction and depression    PATIENT EDUCATION:  Education details: HEP update, postural awareness, self-STM techniques to R shoulder complex using tennis ball on wall, and Ktape wearing and removal instructions  Person educated: Patient Education method: Explanation, Demonstration, Tactile cues, Verbal cues, and Handouts Education comprehension: verbalized understanding, returned demonstration, verbal cues required, tactile cues required, and needs further education  HOME EXERCISE PROGRAM: Access Code: [email or web address] URL: [email or web address] Date: 08/12/2023 Prepared by: Glenetta Hew  Exercises - Seated Gentle Upper Trapezius Stretch  - 2 x daily - 7 x weekly - 3 reps - 30 sec hold - Gentle Levator Scapulae Stretch (Mirrored)  - 2 x daily - 7 x weekly - 3 reps - 30 sec hold - Standing Bilateral Low Shoulder Row with Anchored Resistance  - 1  x daily - 7 x weekly - 2 sets - 10 reps - 5 sec hold - Scapular Retraction with Resistance Advanced  - 1 x daily - 7 x weekly - 2 sets - 10 reps - 5 sec hold - Seated Thoracic Lumbar Extension  - 1 x daily - 7 x weekly - 2 sets - 10 reps - Seated Cervical Retraction  - 1 x daily - 7 x weekly - 2 sets - 10 reps - Bicep Stretch at Table  - 2 x daily - 7 x weekly - 2 sets - 10 reps - 3 sec hold - Doorway Pec Stretch at 60 Degrees Abduction with Arm Straight  - 2 x daily - 7 x weekly - 3 reps - 30 sec hold - Supine Thoracic Mobilization Towel Roll Vertical with Arm Stretch  - 1 x daily - 7 x weekly - 2 sets - 10 reps - 3 sec hold - Standing Pectoral Release with Ball at Wall  - 1-2 x daily - 7 x weekly - 1-2 min hold - Standing Infraspinatus/Teres Minor Release with Ball at Wall  - 1-2 x daily - 7 x weekly - 1-2 min hold  Patient Education - Kinesiology tape   ASSESSMENT:  CLINICAL IMPRESSION: Pt unsure today whether the KT tape helped her much. She arrived  with a headache and increased cervical tension. Decreased headache pain after the manual techniques today. She still needs postural reinforcement to pull shoulders back and keep them down. She does report 25% improvement in B shoulder pain meeting STG #2.Kenidi will benefit from continued skilled PT to address ongoing abnormal muscle tension, ROM and strength deficits to improve mobility and activity tolerance with decreased pain interference.     OBJECTIVE IMPAIRMENTS: Abnormal gait, decreased activity tolerance, decreased balance, decreased mobility, difficulty walking, decreased ROM, decreased strength, increased fascial restrictions, impaired perceived functional ability, increased muscle spasms, impaired UE functional use, postural dysfunction, and pain.   ACTIVITY LIMITATIONS: carrying, lifting, standing, squatting, stairs, transfers, reach over head, locomotion level, and caring for others  PARTICIPATION LIMITATIONS: meal prep, cleaning,  laundry, driving, shopping, community activity, and yard work  PERSONAL FACTORS: Age, Behavior pattern, Education, Fitness, Past/current experiences, Social background, and Time since onset of injury/illness/exacerbation are also affecting patient's functional outcome.   REHAB POTENTIAL: Fair ongoing issues with pain since surgery January 2024, gross deconditioning, not compliant with HEP since DC from PT   CLINICAL DECISION MAKING: Stable/uncomplicated  EVALUATION COMPLEXITY: Low   GOALS: Goals reviewed with patient? Yes  SHORT TERM GOALS: Target date: 08/11/2023    Patient will be independent with initial HEP to improve outcomes and carryover.  Baseline:  Goal status: MET - 08/02/23  2.  Patient will report 25% improvement in B shoulder pain to improve QOL.   Baseline:  Goal status: MET- 08/16/23  3.  Will be able to dress/perform all self care activities without increased pain in neck and shoulders. Baseline:  Goal status: IN PROGRESS - 08/12/23 - Patient continues to have difficulty with donning/doffing shirts overhead due to pain in the shoulder and upper back.  LONG TERM GOALS: Target date: 09/08/2023   Patient will be independent with ongoing/advanced HEP for self-management at home.  Baseline:  Goal status: IN PROGRESS  2.  Patient will report 50-75% improvement in B shoulder pain to improve QOL.  Baseline:  Goal status: IN PROGRESS  3.  Patient to demonstrate improved upright posture with posterior shoulder girdle engaged to promote improved glenohumeral joint mobility. Baseline:  Goal status: IN PROGRESS  4.  R shoulder AROM to be equal to L without pain provocation to allow for increased ease of ADLs.  Baseline:  Goal status: IN PROGRESS  5.  MMT will have improved by at least one grade in all weak groups  Goal status: IN PROGRESS  6  QuickDASH to have improved by at least 15 points to show improved QOL/subjective improvement  Baseline: 59.1% Goal status: IN  PROGRESS  7.  Patient will experience at least a 50% improvement in sciatica and low back discomfort    Baseline:  Goal status: IN PROGRESS   8. Will be able to perform exercise routine as desired without increase in pain in shoulders/back/LEs  Baseline:  Goal status: IN PROGRESS   PLAN:  PT FREQUENCY: 1-2x/week  PT DURATION: 8 weeks  PLANNED INTERVENTIONS: 97164- PT Re-evaluation, 97110-Therapeutic exercises, 97530- Therapeutic activity, [id number]- Neuromuscular re-education, 97535- Self Care, 16109- Manual therapy, [id number]- Aquatic Therapy, 97014- Electrical stimulation (unattended), 97033- Ionotophoresis 4mg /ml Dexamethasone, Taping, Dry Needling, Cryotherapy, and Moist heat  PLAN FOR NEXT SESSION:  shoulder ROM and strength/postural strength - reapplication of Kinesiotape as benefit noted to promote normalization of muscle activation and improved alignment, proximal and core strength and LE stretching as appropriate, general conditioning. Does not want dry needling.  Might benefit from  water PT if progress is slow in land PT and she is agreeable, has been to [facility] clinic before.    Darleene Cleaver, PTA  08/16/23 2:22 PM

## 2023-08-17 ENCOUNTER — Emergency Department (HOSPITAL_BASED_OUTPATIENT_CLINIC_OR_DEPARTMENT_OTHER): Payer: PPO

## 2023-08-17 ENCOUNTER — Ambulatory Visit: Payer: Self-pay | Admitting: Family Medicine

## 2023-08-17 ENCOUNTER — Encounter: Payer: Self-pay | Admitting: Family Medicine

## 2023-08-17 ENCOUNTER — Encounter (HOSPITAL_BASED_OUTPATIENT_CLINIC_OR_DEPARTMENT_OTHER): Payer: Self-pay | Admitting: Emergency Medicine

## 2023-08-17 ENCOUNTER — Other Ambulatory Visit: Payer: Self-pay

## 2023-08-17 ENCOUNTER — Emergency Department (HOSPITAL_BASED_OUTPATIENT_CLINIC_OR_DEPARTMENT_OTHER)
Admission: EM | Admit: 2023-08-17 | Discharge: 2023-08-17 | Disposition: A | Discharge status: Home / Self Care | Payer: PPO | Attending: Emergency Medicine | Admitting: Emergency Medicine

## 2023-08-17 DIAGNOSIS — K828 Other specified diseases of gallbladder: Secondary | ICD-10-CM | POA: Diagnosis not present

## 2023-08-17 DIAGNOSIS — D3500 Benign neoplasm of unspecified adrenal gland: Secondary | ICD-10-CM | POA: Insufficient documentation

## 2023-08-17 DIAGNOSIS — I2510 Atherosclerotic heart disease of native coronary artery without angina pectoris: Secondary | ICD-10-CM | POA: Diagnosis not present

## 2023-08-17 DIAGNOSIS — Z7982 Long term (current) use of aspirin: Secondary | ICD-10-CM | POA: Diagnosis not present

## 2023-08-17 DIAGNOSIS — K760 Fatty (change of) liver, not elsewhere classified: Secondary | ICD-10-CM | POA: Diagnosis not present

## 2023-08-17 DIAGNOSIS — R109 Unspecified abdominal pain: Secondary | ICD-10-CM | POA: Diagnosis not present

## 2023-08-17 DIAGNOSIS — I10 Essential (primary) hypertension: Secondary | ICD-10-CM | POA: Diagnosis not present

## 2023-08-17 DIAGNOSIS — R1010 Upper abdominal pain, unspecified: Secondary | ICD-10-CM | POA: Diagnosis not present

## 2023-08-17 DIAGNOSIS — R1031 Right lower quadrant pain: Secondary | ICD-10-CM | POA: Diagnosis not present

## 2023-08-17 DIAGNOSIS — Z853 Personal history of malignant neoplasm of breast: Secondary | ICD-10-CM | POA: Diagnosis not present

## 2023-08-17 DIAGNOSIS — Z79899 Other long term (current) drug therapy: Secondary | ICD-10-CM | POA: Insufficient documentation

## 2023-08-17 DIAGNOSIS — C50912 Malignant neoplasm of unspecified site of left female breast: Secondary | ICD-10-CM | POA: Diagnosis not present

## 2023-08-17 LAB — CBC WITH DIFFERENTIAL/PLATELET
Abs Immature Granulocytes: 0.02 10*3/uL (ref 0.00–0.07)
Basophils Absolute: 0.1 10*3/uL (ref 0.0–0.1)
Basophils Relative: 1 %
Eosinophils Absolute: 0.1 10*3/uL (ref 0.0–0.5)
Eosinophils Relative: 1 %
HCT: 39.7 % (ref 36.0–46.0)
Hemoglobin: 13.1 g/dL (ref 12.0–15.0)
Immature Granulocytes: 0 %
Lymphocytes Relative: 30 %
Lymphs Abs: 2.7 10*3/uL (ref 0.7–4.0)
MCH: 32.6 pg (ref 26.0–34.0)
MCHC: 33 g/dL (ref 30.0–36.0)
MCV: 98.8 fL (ref 80.0–100.0)
Monocytes Absolute: 0.7 10*3/uL (ref 0.1–1.0)
Monocytes Relative: 7 %
Neutro Abs: 5.6 10*3/uL (ref 1.7–7.7)
Neutrophils Relative %: 61 %
Platelets: 204 10*3/uL (ref 150–400)
RBC: 4.02 MIL/uL (ref 3.87–5.11)
RDW: 12.7 % (ref 11.5–15.5)
WBC: 9.1 10*3/uL (ref 4.0–10.5)
nRBC: 0 % (ref 0.0–0.2)

## 2023-08-17 LAB — URINALYSIS, ROUTINE W REFLEX MICROSCOPIC
Bilirubin Urine: NEGATIVE
Glucose, UA: NEGATIVE mg/dL
Hgb urine dipstick: NEGATIVE
Ketones, ur: NEGATIVE mg/dL
Nitrite: NEGATIVE
Protein, ur: NEGATIVE mg/dL
Specific Gravity, Urine: 1.025 (ref 1.005–1.030)
pH: 5 (ref 5.0–8.0)

## 2023-08-17 LAB — COMPREHENSIVE METABOLIC PANEL
ALT: 21 U/L (ref 0–44)
AST: 26 U/L (ref 15–41)
Albumin: 4.3 g/dL (ref 3.5–5.0)
Alkaline Phosphatase: 70 U/L (ref 38–126)
Anion gap: 10 (ref 5–15)
BUN: 17 mg/dL (ref 8–23)
CO2: 26 mmol/L (ref 22–32)
Calcium: 9.3 mg/dL (ref 8.9–10.3)
Chloride: 101 mmol/L (ref 98–111)
Creatinine, Ser: 0.76 mg/dL (ref 0.44–1.00)
GFR, Estimated: >60 mL/min (ref >60)
Glucose, Bld: 100 mg/dL — ABNORMAL HIGH (ref 70–99)
Potassium: 4 mmol/L (ref 3.5–5.1)
Sodium: 137 mmol/L (ref 135–145)
Total Bilirubin: 1 mg/dL (ref 0.0–1.2)
Total Protein: 7.4 g/dL (ref 6.5–8.1)

## 2023-08-17 LAB — URINALYSIS, MICROSCOPIC (REFLEX): RBC / HPF: NONE SEEN RBC/hpf (ref 0–5)

## 2023-08-17 LAB — LIPASE, BLOOD: Lipase: 31 U/L (ref 11–51)

## 2023-08-17 MED ORDER — OXYCODONE-ACETAMINOPHEN 5-325 MG PO TABS: 1.0000 | ORAL_TABLET | Freq: Four times a day (QID) | ORAL | 0 refills | Status: DC | PRN

## 2023-08-17 MED ORDER — IOHEXOL 350 MG/ML SOLN
100.0000 mL | Freq: Once | INTRAVENOUS | Status: AC | PRN
  Administered 2023-08-17: 100 mL via INTRAVENOUS

## 2023-08-17 MED ORDER — KETOROLAC TROMETHAMINE 30 MG/ML IJ SOLN
30.0000 mg | Freq: Once | INTRAMUSCULAR | Status: AC
  Administered 2023-08-17: 30 mg via INTRAVENOUS
  Filled 2023-08-17: qty 1

## 2023-08-17 MED ORDER — MORPHINE SULFATE (PF) 4 MG/ML IV SOLN
4.0000 mg | Freq: Once | INTRAVENOUS | Status: AC
  Administered 2023-08-17: 4 mg via INTRAVENOUS
  Filled 2023-08-17: qty 1

## 2023-08-17 MED ORDER — HYDROMORPHONE HCL 1 MG/ML IJ SOLN
1.0000 mg | Freq: Once | INTRAMUSCULAR | Status: AC
  Administered 2023-08-17: 1 mg via INTRAVENOUS
  Filled 2023-08-17: qty 1

## 2023-08-17 MED ORDER — ONDANSETRON HCL 4 MG PO TABS: 4.0000 mg | ORAL_TABLET | Freq: Three times a day (TID) | ORAL | 0 refills | Status: DC | PRN

## 2023-08-17 MED ORDER — SODIUM CHLORIDE 0.9 % IV BOLUS
1000.0000 mL | Freq: Once | INTRAVENOUS | Status: AC
  Administered 2023-08-17: 1000 mL via INTRAVENOUS

## 2023-08-17 MED ORDER — ONDANSETRON HCL 4 MG/2ML IJ SOLN
4.0000 mg | Freq: Once | INTRAMUSCULAR | Status: AC
  Administered 2023-08-17: 4 mg via INTRAVENOUS
  Filled 2023-08-17: qty 2

## 2023-08-17 NOTE — Discharge Instructions (Signed)
 You were seen in the emergency department for right flank pain.  You had lab work urinalysis ultrasound and CAT scans that did not show an obvious explanation for your symptoms.  We are treating you with some narcotic pain medicine to use only as needed.  Please follow-up with your primary care doctor.  Return to the emergency department if any high fevers or worsening symptoms.

## 2023-08-17 NOTE — Telephone Encounter (Signed)
  Chief Complaint: flank pain Symptoms: R lower flank pain, sudden onset, severe pain, N/V 1 hr ago, urine been dark for several days  Frequency: today Pertinent Negatives: Patient denies dysuria Disposition: [x] ED /[] Urgent Care (no appt availability in office) / [] Appointment(In office/virtual)/ []  [facility]/ [] Home Care/ [] Refused Recommended Disposition /[] [facility]/ []  Follow-up with PCP Additional Notes: pt and husband reporting above sx. States R lower flank pain started approx 2 hours ago. Advised pt go to ED for evaluation and I would notify PCP. Pt will go to [facility] ED. CAL # called, spoke to [place], [place]. Advised of ED.   Copied from CRM [id number]. Topic: Clinical - Red Word Triage >> Aug 17, 2023 11:59 AM Drema Balzarine wrote: Red Word that prompted transfer to Nurse Triage: Patient spouse called says patient is having severe back pain on right lower side, she has been vomiting and has mucus  Reason for Disposition  [1] Sudden onset of severe flank pain AND [2] age > 60 years  Answer Assessment - Initial Assessment Questions 1. LOCATION: "Where does it hurt?" (e.g., left, right)     R lower flank  2. ONSET: "When did the pain start?"     Pain today but other sx few days  3. SEVERITY: "How bad is the pain?" (e.g., Scale 1-10; mild, moderate, or severe)   - MILD (1-3): doesn't interfere with normal activities    - MODERATE (4-7): interferes with normal activities or awakens from sleep    - SEVERE (8-10): excruciating pain and patient unable to do normal activities (stays in bed)       Severe pain  4. PATTERN: "Does the pain come and go, or is it constant?"      Constant  6. OTHER SYMPTOMS:  "Do you have any other symptoms?" (e.g., fever, abdomen pain, vomiting, leg weakness, burning with urination, blood in urine)     Urine dark yellow, N/V, deep breath or movement makes pain worse  Protocols used: Flank Pain-A-AH

## 2023-08-17 NOTE — ED Triage Notes (Signed)
 Rt sided flank pain  started today  wioth nausea and vomiting

## 2023-08-17 NOTE — ED Provider Notes (Signed)
 Signout from Dr. Shirlee Limerick.  73 year old female with right flank into right lower quadrant abdominal pain with nausea and vomiting.  Lab work fairly unremarkable.  CT is pending.  Disposition per results of CT Physical Exam  BP 129/77   Pulse 70   Temp 98.6 F (37 C)   Resp 16   Ht 5\' 7"  (1.702 m)   Wt 93 kg   SpO2 96%   BMI 32.11 kg/m   Physical Exam  Procedures  Procedures  ED Course / MDM    Medical Decision Making Amount and/or Complexity of Data Reviewed Labs: ordered. Radiology: ordered.  Risk Prescription drug management.   4 PM.  CT does not show any acute findings other than incidental adenoma.  I reviewed results with patient.  She said her pain is about a 6 as long she does not take a deep breath or move.  Have put her in for a right upper quadrant ultrasound although nothing very suspicious on her CAT scan.  7:20 PM.  CT angio chest right upper quadrant ultrasound CT abdomen pelvis with contrast did not show any acute findings.  Reviewed this with patient.  She is comfortable plan for discharge.  Will prescribe her some pain medication.  Recommended close follow-up with PCP.     Terrilee Files, MD 08/17/23 Norberta Keens

## 2023-08-17 NOTE — ED Provider Notes (Signed)
 [place] [facility] AT [facility] Provider Note   CSN: [id number] Arrival date & time: 08/17/23  1325     History {Add pertinent medical, surgical, social history, OB history to HPI:1} Chief Complaint  Patient presents with   Abdominal Pain    Tonya [name] is a 73 y.o. female.  Pt is a 73 yo female with pmhx significant for htn, hld, breast cancer, and gerd.  Pt started with right flank pain today and n/v.  Pt has never had anything like this in the past.  Pt denies f/c.       Home Medications Prior to Admission medications   Medication Sig Start Date End Date Taking? Authorizing Provider  aspirin EC (ASPIRIN LOW DOSE) 81 MG tablet TAKE 1 TABLET (81 MG TOTAL) BY MOUTH DAILY. SWALLOW WHOLE. 09/14/22   Tolia, Sunit, DO  atenolol (TENORMIN) 50 MG tablet TAKE 1 TABLET BY MOUTH EVERY DAY 08/02/23   Wendling, Jilda Roche, DO  calcium-vitamin D (OSCAL WITH D) 250-125 MG-UNIT tablet Take 1 tablet by mouth daily.    [provider]  Coenzyme Q10 (COQ10 PO) Take 300 mg by mouth daily.    [provider]  losartan (COZAAR) 50 MG tablet TAKE 1 TABLET BY MOUTH EVERY DAY 08/09/23   Wendling, Jilda Roche, DO  lovastatin (MEVACOR) 10 MG tablet Take 1 tablet (10 mg total) by mouth at bedtime. 08/03/23   Sharlene Dory, DO  montelukast (SINGULAIR) 10 MG tablet TAKE 1 TABLET BY MOUTH EVERYDAY AT BEDTIME 05/04/23   Wendling, Jilda Roche, DO  Multiple Vitamins-Minerals (MULTIVITAMIN ADULTS 50+) TABS Take by mouth.    [provider]  pantoprazole (PROTONIX) 20 MG tablet TAKE 1 TABLET BY MOUTH EVERY DAY 08/09/23   Wendling, Jilda Roche, DO      Allergies    Crestor [rosuvastatin] and Lipitor [atorvastatin]    Review of Systems   Review of Systems  Genitourinary:  Positive for flank pain.  All other systems reviewed and are negative.   Physical Exam Updated Vital Signs BP (!) 172/88 (BP Location: Right Arm)   Pulse 96   Temp  98.6 F (37 C)   Resp 20   Ht 5\' 7"  (1.702 m)   Wt 93 kg   SpO2 98%   BMI 32.11 kg/m  Physical Exam Vitals and nursing note reviewed.  Constitutional:      Appearance: She is well-developed.  HENT:     Head: Normocephalic and atraumatic.     Mouth/Throat:     Mouth: Mucous membranes are moist.     Pharynx: Oropharynx is clear.  Eyes:     Extraocular Movements: Extraocular movements intact.     Pupils: Pupils are equal, round, and reactive to light.  Cardiovascular:     Rate and Rhythm: Normal rate and regular rhythm.     Heart sounds: Normal heart sounds.  Pulmonary:     Effort: Pulmonary effort is normal.     Breath sounds: Normal breath sounds.  Abdominal:     General: Abdomen is flat. Bowel sounds are normal.     Palpations: Abdomen is soft.  Skin:    General: Skin is warm.     Capillary Refill: Capillary refill takes less than 2 seconds.  Neurological:     General: No focal deficit present.     Mental Status: She is alert and oriented to person, place, and time.  Psychiatric:        Mood and Affect: Mood normal.  Behavior: Behavior normal.     ED Results / Procedures / Treatments   Labs (all labs ordered are listed, but only abnormal results are displayed) Labs Reviewed  CBC WITH DIFFERENTIAL/PLATELET  COMPREHENSIVE METABOLIC PANEL  LIPASE, BLOOD  URINALYSIS, ROUTINE W REFLEX MICROSCOPIC    EKG None  Radiology No results found.  Procedures Procedures  {Document cardiac monitor, telemetry assessment procedure when appropriate:1}  Medications Ordered in ED Medications  sodium chloride 0.9 % bolus 1,000 mL (has no administration in time range)  morphine (PF) 4 MG/ML injection 4 mg (has no administration in time range)  ondansetron (ZOFRAN) injection 4 mg (has no administration in time range)  ketorolac (TORADOL) 30 MG/ML injection 30 mg (has no administration in time range)    ED Course/ Medical Decision Making/ A&P   {   Click here for  ABCD2, HEART and other calculatorsREFRESH Note before signing :1}                              Medical Decision Making Amount and/or Complexity of Data Reviewed Labs: ordered. Radiology: ordered.  Risk Prescription drug management.   This patient presents to the ED for concern of right flank pain, this involves an extensive number of treatment options, and is a complaint that carries with it a high risk of complications and morbidity.  The differential diagnosis includes kidney stone, kidney infection, cholecystitis   Co morbidities that complicate the patient evaluation   htn, hld, breast cancer, and gerd   Additional history obtained:  Additional history obtained from epic chart review External records from outside source obtained and reviewed including family   Lab Tests:  I Ordered, and personally interpreted labs.  The pertinent results include:  ua neg, cbc nl, cmp nl, lip nl   Imaging Studies ordered:  I ordered imaging studies including ct renal  I independently visualized and interpreted imaging which showed *** I agree with the radiologist interpretation   Cardiac Monitoring:  The patient was maintained on a cardiac monitor.  I personally viewed and interpreted the cardiac monitored which showed an underlying rhythm of: nsr   Medicines ordered and prescription drug management:  I ordered medication including ivfs, zofran, toradol  for sx  Reevaluation of the patient after these medicines showed that the patient improved I have reviewed the patients home medicines and have made adjustments as needed   Test Considered:  ***   Critical Interventions:  ***   Consultations Obtained:  I requested consultation with the ***,  and discussed lab and imaging findings as well as pertinent plan - they recommend: ***   Problem List / ED Course:  ***   Reevaluation:  After the interventions noted above, I reevaluated the patient and found that they  have :improved   Social Determinants of Health:  Lives at home   Dispostion:  After consideration of the diagnostic results and the patients response to treatment, I feel that the patent would benefit from ***.    {Document critical care time when appropriate:1} {Document review of labs and clinical decision tools ie heart score, Chads2Vasc2 etc:1}  {Document your independent review of radiology images, and any outside records:1} {Document your discussion with family members, caretakers, and with consultants:1} {Document social determinants of health affecting pt's care:1} {Document your decision making why or why not admission, treatments were needed:1} Final Clinical Impression(s) / ED Diagnoses Final diagnoses:  None    Rx / DC  Orders ED Discharge Orders     None

## 2023-08-17 NOTE — Telephone Encounter (Signed)
 This encounter was created in error - please disregard.

## 2023-08-18 ENCOUNTER — Encounter: Payer: Self-pay | Admitting: Adult Health

## 2023-08-18 ENCOUNTER — Ambulatory Visit: Payer: PPO | Admitting: Physical Therapy

## 2023-08-19 ENCOUNTER — Ambulatory Visit: Payer: Self-pay | Admitting: Family Medicine

## 2023-08-19 ENCOUNTER — Telehealth: Payer: Self-pay | Admitting: Neurology

## 2023-08-19 ENCOUNTER — Encounter: Payer: PPO | Admitting: Physical Therapy

## 2023-08-19 NOTE — Telephone Encounter (Signed)
 Copied from CRM [id number]. Topic: Clinical - Red Word Triage >> Aug 19, 2023  8:14 AM Truddie Crumble wrote: Red Word that prompted transfer to Nurse Triage: patient is still not feeling well after she went to the emergency room for  possible gallbladder and kidney stone patient has a achy pain on the left side. The pain is radiating to the front of her body, nausea,achy and hurting   Chief Complaint: right flank pain Symptoms: tenderness in right lower back Frequency: intermittent Pertinent Negatives: Patient denies bladder fullness or pain with urination Disposition: [] ED /[] Urgent Care (no appt availability in office) / [] Appointment(In office/virtual)/ []  [facility]/ [] Home Care/ [] Refused Recommended Disposition /[] [facility]/ [x]  Follow-up with PCP Additional Notes: Patient seen in ED on Tuesday for same reason. Treated with pain medication, nothing acute shown on CT. Pt was advised to follow-up with PCP. Pt now reports 4/10 pain. States that pain is better with Tylenol as she doesn't want to take the Percocet that was prescribed. RN offered sooner visit with another provider since PCP is not avail untl 3/3, pt declines. Sts that she will continue to treat with OTC meds. RN provided care advice, pt verbalized understanding. Pts HFU appt is 3/3 with Dr. Carmelia Roller.   Reason for Disposition  MODERATE pain (e.g., interferes with normal activities or awakens from sleep)  Answer Assessment - Initial Assessment Questions 1. LOCATION: "Where does it hurt?" (e.g., left, right)     Right flank pain  2. ONSET: "When did the pain start?"     Pain started on started 2 days ago after breakfast  3. SEVERITY: "How bad is the pain?" (e.g., Scale 1-10; mild, moderate, or severe)   - MILD (1-3): doesn't interfere with normal activities    - MODERATE (4-7): interferes with normal activities or awakens from sleep    - SEVERE (8-10): excruciating pain and patient unable to do normal  activities (stays in bed)       4/10 pain  4. PATTERN: "Does the pain come and go, or is it constant?"      Comes and goes  5. CAUSE: "What do you think is causing the pain?"     Unsure of cause, was seen in ED to r/o kidney stone  6. OTHER SYMPTOMS:  "Do you have any other symptoms?" (e.g., fever, abdomen pain, vomiting, leg weakness, burning with urination, blood in urine)     nausea 7. PREGNANCY:  "Is there any chance you are pregnant?" "When was your last menstrual period?"     No  Protocols used: Flank Pain-A-AH

## 2023-08-19 NOTE — Telephone Encounter (Signed)
 Copied from CRM [id number]. Topic: Appointments - Appointment Cancel/Reschedule >> Aug 19, 2023  8:07 AM Truddie Crumble wrote: Patient/patient representative is calling to cancel or reschedule an appointment. Refer to attachments for appointment information. Patient called stating she was asked to follow up with the doctor in regards to an emergency room visit for a possible gall bladder or kidney stone. Patient stated she would like to see if the provider has anything sooner because she is still not feeling well from the visit

## 2023-08-20 ENCOUNTER — Ambulatory Visit: Payer: PPO | Admitting: Family Medicine

## 2023-08-20 ENCOUNTER — Encounter: Payer: Self-pay | Admitting: Family Medicine

## 2023-08-20 VITALS — BP 128/82 | HR 73 | Temp 97.8°F | Resp 20 | Ht 67.0 in | Wt 205.6 lb

## 2023-08-20 DIAGNOSIS — M545 Low back pain, unspecified: Secondary | ICD-10-CM | POA: Diagnosis not present

## 2023-08-20 DIAGNOSIS — K5903 Drug induced constipation: Secondary | ICD-10-CM | POA: Diagnosis not present

## 2023-08-20 MED ORDER — TIZANIDINE HCL 4 MG PO TABS
4.0000 mg | ORAL_TABLET | Freq: Four times a day (QID) | ORAL | 0 refills | Status: DC | PRN
Start: 2023-08-20 — End: 2023-10-26

## 2023-08-20 NOTE — Telephone Encounter (Signed)
Pt scheduled for 4 pm today

## 2023-08-20 NOTE — Progress Notes (Signed)
 Chief Complaint  Patient presents with   Follow-up    Patient presents today for follow-up from emergency department     Subjective: Patient is a 73 y.o. female here for ED f/u.  R lower back pain starting 3 days ago after she was letting the cat out a few times. No specific inj or change in activity otherwise. The pain was sharp and quite severe. She went to the ED after having associated nausea and vomiting x 2. She had a CT abd/pelvis and CTA chest which were unremarkable. On the way home, her pain was a little better. She threw up x3 in her car. She had a stabbing bout of pain in the L upper shoulder. She felt slightly better the next day but kept having pain, nausea and headaches.   Today, pain is much better. She is still having some nausea. She has not had a BM yet. She had IV Dilaudid in the ED on 2/25.   Past Medical History:  Diagnosis Date   Allergy June 2012   Seasonal   Asthma 2015   Cancer [facility]) 2010   Left Breast   Coronary atherosclerosis due to calcified coronary lesion    GERD (gastroesophageal reflux disease)    Many years ago   History of breast cancer    Hyperlipidemia    Hypertension    Osteopenia     Objective: BP 128/82   Pulse 73   Temp 97.8 F (36.6 C)   Resp 20   Ht 5\' 7"  (1.702 m)   Wt 205 lb 9.6 oz (93.3 kg)   SpO2 97%   BMI 32.20 kg/m  General: Awake, appears stated age Heart: RRR, no LE edema Lungs: CTAB, no rales, wheezes or rhonchi. No accessory muscle use Abd: BS+, S, ND, mild ttp in LLQ MSK: Mild ttp in the ES msc group on R in lower lumbar region Neuro: DTRs equal and symmetric lower extremities bilaterally, no clonus, no cerebellar signs, gait is normal Psych: Age appropriate judgment and insight, normal affect and mood  Assessment and Plan: Drug-induced constipation  Acute right-sided low back pain without sciatica - Plan: tiZANidine (ZANAFLEX) 4 MG tablet  Adverse effect of med from ED (opiate).  Stay hydrated.  Keep fiber  intake up.  Consider milk of magnesia and 2 tablespoons along with 4 ounces of warm prune juice.  Do not go into public or go to bed right after this.  Consider an enema or suppository if no improvement.  Doubt Relistor would be covered. Nonsedating muscle relaxant as above.  Warned about possible drowsiness as a side effect though.  Stretches, exercises, heat, ice, Tylenol.  Seems to be improving.  She is following with physical therapy already. The patient voiced understanding and agreement to the plan.  I spent 30 minutes with the patient discussing above plans in addition to reviewing her chart on the same day the visit.  Jilda Roche [place], DO 08/20/23  4:42 PM

## 2023-08-20 NOTE — Patient Instructions (Addendum)
 Heat (pad or rice pillow in microwave) over affected area, 10-15 minutes twice daily.   Ice/cold pack over area for 10-15 min twice daily.  OK to take Tylenol 1000 mg (2 extra strength tabs) or 975 mg (3 regular strength tabs) every 6 hours as needed.  Stay hydrated.  Try 2 tablespoons of milk of mag in 4 oz of warm prune juice. Do that and wait a couple hours. If no improvement, try a Dulcolax suppository and then let me know if we are still having issues.   Take Zanaflex (tizanidine) 1-2 hours before planned bedtime. If it makes you drowsy, do not take during the day. You can try half a tab the following night.  Your labs are reassuring.   Let us know if you need anything.

## 2023-08-23 ENCOUNTER — Ambulatory Visit: Payer: PPO | Attending: Family Medicine

## 2023-08-23 DIAGNOSIS — M25511 Pain in right shoulder: Secondary | ICD-10-CM | POA: Diagnosis not present

## 2023-08-23 DIAGNOSIS — R29898 Other symptoms and signs involving the musculoskeletal system: Secondary | ICD-10-CM | POA: Insufficient documentation

## 2023-08-23 DIAGNOSIS — M5459 Other low back pain: Secondary | ICD-10-CM | POA: Diagnosis not present

## 2023-08-23 DIAGNOSIS — G8929 Other chronic pain: Secondary | ICD-10-CM | POA: Diagnosis not present

## 2023-08-23 DIAGNOSIS — M6281 Muscle weakness (generalized): Secondary | ICD-10-CM | POA: Insufficient documentation

## 2023-08-23 DIAGNOSIS — M25512 Pain in left shoulder: Secondary | ICD-10-CM | POA: Diagnosis not present

## 2023-08-23 NOTE — Therapy (Signed)
 OUTPATIENT PHYSICAL THERAPY TREATMENT   Patient Name: VANNARY GREENING MRN: [id number] DOB:05-27-1951, 73 y.o., female Today's Date: 08/23/2023   END OF SESSION:  PT End of Session - 08/23/23 1235     Visit Number 8    Date for PT Re-Evaluation 09/08/23    Authorization Type HTA    Progress Note Due on Visit 10    PT Start Time 1150    PT Stop Time 1232    PT Time Calculation (min) 42 min    Activity Tolerance Patient tolerated treatment well    Behavior During Therapy [facility] for tasks assessed/performed                    Past Medical History:  Diagnosis Date   Allergy June 2012   Seasonal   Asthma 2015   Cancer [facility]) 2010   Left Breast   Coronary atherosclerosis due to calcified coronary lesion    GERD (gastroesophageal reflux disease)    Many years ago   History of breast cancer    Hyperlipidemia    Hypertension    Osteopenia    Past Surgical History:  Procedure Laterality Date   ABDOMINAL HYSTERECTOMY  2015   AUGMENTATION MAMMAPLASTY Left    BREAST BIOPSY Right 02/2019   BREAST IMPLANT EXCHANGE     COLONOSCOPY     COSMETIC SURGERY  2010   Breast cancer related   FOOT SURGERY Left 05/25/2020   MASTECTOMY Left    REDUCTION MAMMAPLASTY Right    ROTATOR CUFF REPAIR Right 07/17/2022   TONSILLECTOMY AND ADENOIDECTOMY  1958   Patient Active Problem List   Diagnosis Date Noted   MCI (mild cognitive impairment) 05/31/2023   Alzheimer's disease, unspecified (CODE) (HCC) 05/31/2023   Persistent disorder of initiating or maintaining sleep 04/13/2023   Short-term memory loss 03/01/2023   History of breast cancer 03/01/2023   Acute bursitis of right shoulder 04/21/2022   Other bursal cyst, left ankle and foot 04/21/2022   Coronary artery disease due to lipid rich plaque 09/30/2021   Hepatic steatosis 09/30/2021   Adenoma of left adrenal gland 09/30/2021   Strain of left trapezius muscle 09/30/2021   Hyperlipidemia 05/19/2021   Somatic dysfunction of  spine, thoracic 03/26/2021   Chronic left-sided thoracic back pain 12/30/2020   DCIS (ductal carcinoma in situ) 01/26/2020   Low back pain 01/26/2020   Chronic pain of both knees 09/19/2019   Osteopenia    Estrogen deficiency 04/14/2018   Essential hypertension 01/10/2018   Gastroesophageal reflux disease 01/10/2018   Mild intermittent asthma without complication 01/10/2018   Pain in left lower leg 03/03/2017   Family history of colon cancer 02/23/2017   Herpes simplex vulvovaginitis 02/18/2017   Neuropathy 02/18/2017   Elevated liver enzymes 02/17/2017   Need for Streptococcus pneumoniae vaccination 02/17/2017   Chondromalacia of left patella 12/28/2016   Synovitis of left knee 12/28/2016   Seasonal allergic rhinitis due to fungal spores 09/25/2016   Bone mass 09/24/2016   Chronic hip pain 09/24/2016   Dark yellow-colored urine 09/24/2016   Low oxygen saturation 09/24/2016   Other specified postprocedural states 01/17/2015   Abnormal stress electrocardiogram test 10/20/2013   Breast mass 12/11/2010   Family history of malignant neoplasm of breast 12/11/2010    PCP: Sharlene Dory, DO   REFERRING PROVIDER: Sharlene Dory, DO  REFERRING DIAG: [phone] (ICD-10-CM) - Bilateral shoulder pain, unspecified chronicity   THERAPY DIAG:  Chronic right shoulder pain  Chronic left shoulder pain  Muscle weakness (generalized)  Other low back pain  Other symptoms and signs involving the musculoskeletal system  Acute pain of right shoulder  RATIONALE FOR EVALUATION AND TREATMENT: Rehabilitation  ONSET DATE: chronic- had initial rotator cuff surgery January 2024  NEXT MD VISIT: Referring PRN    SUBJECTIVE:                                                                                                                                                                                                         SUBJECTIVE STATEMENT: Pt reports she went to ED  last Tuesday d/t flank pain, had CT scans and MRI nothing was found, she believes she may have past a kidney stone. Has not felt pain since Wednesday.   EVAL:  I'm having issues with both shoulders, R>L. Had R rotator cuff repair in the past (January 2024), that one hurts worse and now the left is hurting because I have started using my left arm more. Upper back is sore if I take a deep breath and if I shrug my shoulders. Feels like my right shoulder blade is "out". Muscle directly below my shoulder blade can go numb sometimes. Both arms ache at times all the way down to elbows and sometimes forearms. Feel very weak. Having some issues with my legs as well, I hurt everywhere in my body. When I try to reach across myself I get a bad pinch in my shoulder.   PAIN: Are you having pain? Yes: NPRS scale: 0/10  Pain location: R shoulder/upper arm; base of skull  Pain description: sharp and sore  Aggravating factors: movement in general  Relieving factors: not moving, ice, heat   PERTINENT HISTORY:  R RCR 07/20/22, osteopenia, L breast cancer, HTN, CAD, Alzheimer's dementia, LBP, sciatica, neuropathy, fatigue  PRECAUTIONS: None  RED FLAGS: None  HAND DOMINANCE: Right  WEIGHT BEARING RESTRICTIONS: No  FALLS:  Has patient fallen in last 6 months? No  LIVING ENVIRONMENT: Lives with: lives with their spouse Lives in: House/apartment  OCCUPATION: retired - used to work in Automotive engineer  PLOF: Independent, Independent with basic ADLs, Independent with gait, and Independent with transfers  PATIENT GOALS:  address pain, be able to do more, be able to exercise to lose weight/tolerate exercise better    OBJECTIVE: (objective measures completed at initial evaluation unless otherwise dated)  DIAGNOSTIC FINDINGS:  06/11/22 - R shoulder MRI: IMPRESSION: 1. Severe tendinosis of the supraspinatus tendon with a large full-thickness, near complete, tear with 3.8 cm of retraction and a few  intact anterior most fibers. 2.  Moderate tendinosis of the infraspinatus tendon with a tiny interstitial tear. 3. Mild tendinosis of the intra-articular portion of the long head of the biceps tendon. 04/21/22 - DG R shoulder: IMPRESSION: No fracture or dislocation of the right shoulder. Mild acromioclavicular and glenohumeral arthrosis.  PATIENT SURVEYS:  Quick Dash 59.1%  COGNITION: Overall cognitive status: History of cognitive impairments - at baseline mild cognitive impairment at baseline per chart    POSTURE: rounded shoulders, forward head, and increased thoracic kyphosis  UPPER EXTREMITY ROM:   Active ROM Right eval Left eval  Shoulder flexion 145 160  Shoulder extension    Shoulder abduction 145 170  Shoulder adduction    Shoulder internal rotation L4  T7  Shoulder external rotation C4 C7  Elbow flexion    Elbow extension    Wrist flexion    Wrist extension    Wrist ulnar deviation    Wrist radial deviation    Wrist pronation    Wrist supination    (Blank rows = not tested)  UPPER/LOWER EXTREMITY MMT:  MMT Right eval Left eval  Shoulder flexion 3+ 4  Shoulder extension    Shoulder abduction 3 4  Shoulder adduction    Shoulder internal rotation 4 4  Shoulder external rotation 3 4  Middle trapezius    Lower trapezius    Elbow flexion    Elbow extension    Wrist flexion    Wrist extension    Wrist ulnar deviation    Wrist radial deviation    Wrist pronation    Wrist supination    Grip strength (lbs)    Hip flexion  5 5  Quads  5 5  Hamstrings  4+ 4+  Ankle dorsiflexion  5 5  (Blank rows = not tested)  PALPATION:  Extremely tight in all paraspinals, upper traps, R anterior shoulder and biceps, suboccipitals     TODAY'S TREATMENT:  08/23/23 THERAPEUTIC EXERCISE: To improve strength, endurance, and ROM.  Demonstration, verbal and tactile cues throughout for technique.  UBE - L1.5 x 6 min (3' fwd & 3' back) - more uncomfortable with backwards Wall  angels x 10  Wall flexion slide Bil x 10 Seated rows 10lb high grips 2x10 Shoulder extension 10lb 2x10  Massage gun IASTM to R rhomboids, thoracic paraspinals, bicep muscle belly 08/16/23 THERAPEUTIC EXERCISE: To improve strength, endurance, and ROM.  Demonstration, verbal and tactile cues throughout for technique.  UBE - L2.0 x 6 min (3' fwd & 3' back) - more uncomfortable with backwards SNAG for cervical extension and rotation x 10 bil Standing thoracic ext x 10 Seated rows 10lb high grips x 10  MANUAL THERAPY: Suboccipital release, TPR/STM to to UT, LS, rhomboids bil  08/12/23 THERAPEUTIC EXERCISE: To improve strength, endurance, and ROM.  Demonstration, verbal and tactile cues throughout for technique.  UBE - L2.0 x 6 min (4' fwd & 2' back) - more uncomfortable with backwards Review of positional pectoralis/postural stretches and thoracic mobilization using pool noodle/towel roll in hooklying  MANUAL THERAPY: To promote normalized muscle tension, improved flexibility, improved joint mobility, increased ROM, and reduced pain utilizing joint mobilization, connective tissue massage, therapeutic massage, and manual TP therapy.  STM/DTM and manual TPR to R pec minor and major, R anterior deltoid and biceps, R teres group, R subscapularis and rhomboids/lower traps R scapular mobilization - emphasis on retraction and depression  Provided instruction in self-STM techniques to R anterior and posterior shoulder complex using tennis ball on wall - ball placed in pillowcase (  or sock) to assist with positioning for posterior shoulder.   NEUROMUSCULAR RE-EDUCATION: To improve kinesthesia and posture.  Kinesiotaping for R shoulder impingement, deltoid inhibition and to promote improved posture and scapular engagement - 3 Y strips - 1st from lateral shoulder along spine of scapula, 2nd from distal to deltoid wrapping ant & post to top of shoulder, 3rd from pecs to lateral shoulder.  Wearing and removal  instructions provided to patient.   08/09/23 THERAPEUTIC EXERCISE: To improve strength and flexibility.  Demonstration, verbal and tactile cues throughout for technique.  UBE - L1.5 x 6 min (3' fwd 2 back) Supine chin tucks x 20 Supine shoulder diagonals YTB x 10 each way Supine shoulder ER YTB- p! Standing rows and ext RTB- review  MANUAL THERAPY: To promote normalized muscle tension, improved flexibility, improved joint mobility, increased ROM, and reduced pain utilizing joint mobilization, connective tissue massage, therapeutic massage, and manual TP therapy. STM/DTM and manual TPR to bil  L LS and rhomboids, suboccipital release   08/04/23 THERAPEUTIC EXERCISE: To improve strength and flexibility.  Demonstration, verbal and tactile cues throughout for technique.  UBE - L1.5 x 6 min (3' fwd & back) - pt reports more pain in anterior upper arm with backwards motion Seated R anterior deltoid/biceps stretch 2 x 30" (shoulder extended with long-arm ER and hand facing back on mat table + fwd trunk weight shift and slight L rotation) Seated scap retraction into pool noodle + RTB B shoulder horiz ABD 2 x 10 Seated scap retraction into pool noodle + RTB B shoulder ER 2 x 10  MANUAL THERAPY: To promote normalized muscle tension, improved flexibility, improved joint mobility, increased ROM, and reduced pain utilizing joint mobilization, connective tissue massage, therapeutic massage, and manual TP therapy. STM/DTM and manual TPR to R anterior deltoid and biceps, L LS and rhomboids R/L scapular mobilization - emphasis on retraction and depression    PATIENT EDUCATION:  Education details: HEP update, postural awareness, self-STM techniques to R shoulder complex using tennis ball on wall, and Ktape wearing and removal instructions  Person educated: Patient Education method: Explanation, Demonstration, Tactile cues, Verbal cues, and Handouts Education comprehension: verbalized understanding,  returned demonstration, verbal cues required, tactile cues required, and needs further education  HOME EXERCISE PROGRAM: Access Code: [email or web address] URL: [email or web address] Date: 08/12/2023 Prepared by: Glenetta Hew  Exercises - Seated Gentle Upper Trapezius Stretch  - 2 x daily - 7 x weekly - 3 reps - 30 sec hold - Gentle Levator Scapulae Stretch (Mirrored)  - 2 x daily - 7 x weekly - 3 reps - 30 sec hold - Standing Bilateral Low Shoulder Row with Anchored Resistance  - 1 x daily - 7 x weekly - 2 sets - 10 reps - 5 sec hold - Scapular Retraction with Resistance Advanced  - 1 x daily - 7 x weekly - 2 sets - 10 reps - 5 sec hold - Seated Thoracic Lumbar Extension  - 1 x daily - 7 x weekly - 2 sets - 10 reps - Seated Cervical Retraction  - 1 x daily - 7 x weekly - 2 sets - 10 reps - Bicep Stretch at Table  - 2 x daily - 7 x weekly - 2 sets - 10 reps - 3 sec hold - Doorway Pec Stretch at 60 Degrees Abduction with Arm Straight  - 2 x daily - 7 x weekly - 3 reps - 30 sec hold - Supine Thoracic Mobilization Towel Roll Vertical with Arm Stretch  -  1 x daily - 7 x weekly - 2 sets - 10 reps - 3 sec hold - Standing Pectoral Release with Ball at Wall  - 1-2 x daily - 7 x weekly - 1-2 min hold - Standing Infraspinatus/Teres Minor Release with Ball at [employer]  - 1-2 x daily - 7 x weekly - 1-2 min hold  Patient Education - Kinesiology tape   ASSESSMENT:  CLINICAL IMPRESSION: Pt went to ED and saw her PCP last week for R side low back very sharp causing her to vomit at times pain intensity. They did not find anything from MRI and CT. She has not experienced this pain since this past Wed. Continued postural work strengthening her periscapular muscles and working on thoracic mobility. Cues required to adjust shoulder flexion up the wall to avoid shoulder pain. MT to address muscle restrictions in posterior shoulder. Katrese will benefit from continued skilled PT to address ongoing abnormal muscle  tension, ROM and strength deficits to improve mobility and activity tolerance with decreased pain interference.     OBJECTIVE IMPAIRMENTS: Abnormal gait, decreased activity tolerance, decreased balance, decreased mobility, difficulty walking, decreased ROM, decreased strength, increased fascial restrictions, impaired perceived functional ability, increased muscle spasms, impaired UE functional use, postural dysfunction, and pain.   ACTIVITY LIMITATIONS: carrying, lifting, standing, squatting, stairs, transfers, reach over head, locomotion level, and caring for others  PARTICIPATION LIMITATIONS: meal prep, cleaning, laundry, driving, shopping, community activity, and yard work  PERSONAL FACTORS: Age, Behavior pattern, Education, Fitness, Past/current experiences, Social background, and Time since onset of injury/illness/exacerbation are also affecting patient's functional outcome.   REHAB POTENTIAL: Fair ongoing issues with pain since surgery January 2024, gross deconditioning, not compliant with HEP since DC from PT   CLINICAL DECISION MAKING: Stable/uncomplicated  EVALUATION COMPLEXITY: Low   GOALS: Goals reviewed with patient? Yes  SHORT TERM GOALS: Target date: 08/11/2023    Patient will be independent with initial HEP to improve outcomes and carryover.  Baseline:  Goal status: MET - 08/02/23  2.  Patient will report 25% improvement in B shoulder pain to improve QOL.   Baseline:  Goal status: MET- 08/16/23  3.  Will be able to dress/perform all self care activities without increased pain in neck and shoulders. Baseline:  Goal status: IN PROGRESS - 08/12/23 - Patient continues to have difficulty with donning/doffing shirts overhead due to pain in the shoulder and upper back.  LONG TERM GOALS: Target date: 09/08/2023   Patient will be independent with ongoing/advanced HEP for self-management at home.  Baseline:  Goal status: IN PROGRESS  2.  Patient will report 50-75% improvement  in B shoulder pain to improve QOL.  Baseline:  Goal status: IN PROGRESS  3.  Patient to demonstrate improved upright posture with posterior shoulder girdle engaged to promote improved glenohumeral joint mobility. Baseline:  Goal status: IN PROGRESS  4.  R shoulder AROM to be equal to L without pain provocation to allow for increased ease of ADLs.  Baseline:  Goal status: IN PROGRESS  5.  MMT will have improved by at least one grade in all weak groups  Goal status: IN PROGRESS  6  QuickDASH to have improved by at least 15 points to show improved QOL/subjective improvement  Baseline: 59.1% Goal status: IN PROGRESS  7.  Patient will experience at least a 50% improvement in sciatica and low back discomfort    Baseline:  Goal status: IN PROGRESS   8. Will be able to perform exercise routine as desired  without increase in pain in shoulders/back/LEs  Baseline:  Goal status: IN PROGRESS   PLAN:  PT FREQUENCY: 1-2x/week  PT DURATION: 8 weeks  PLANNED INTERVENTIONS: 97164- PT Re-evaluation, 97110-Therapeutic exercises, 97530- Therapeutic activity, [id number]- Neuromuscular re-education, 97535- Self Care, 16109- Manual therapy, [id number]- Aquatic Therapy, 97014- Electrical stimulation (unattended), [phone]- Ionotophoresis 4mg /ml Dexamethasone, Taping, Dry Needling, Cryotherapy, and Moist heat  PLAN FOR NEXT SESSION:  shoulder ROM and strength/postural strength - reapplication of Kinesiotape as benefit noted to promote normalization of muscle activation and improved alignment, proximal and core strength and LE stretching as appropriate, general conditioning. Does not want dry needling.  Might benefit from water PT if progress is slow in land PT and she is agreeable, has been to [facility] before.    Darleene Cleaver, PTA  08/23/23 12:35 PM

## 2023-08-25 ENCOUNTER — Ambulatory Visit: Payer: PPO | Admitting: Family Medicine

## 2023-08-27 ENCOUNTER — Ambulatory Visit: Payer: PPO

## 2023-08-27 DIAGNOSIS — M25511 Pain in right shoulder: Secondary | ICD-10-CM | POA: Diagnosis not present

## 2023-08-27 DIAGNOSIS — M6281 Muscle weakness (generalized): Secondary | ICD-10-CM

## 2023-08-27 DIAGNOSIS — G8929 Other chronic pain: Secondary | ICD-10-CM

## 2023-08-27 DIAGNOSIS — R29898 Other symptoms and signs involving the musculoskeletal system: Secondary | ICD-10-CM

## 2023-08-27 DIAGNOSIS — M5459 Other low back pain: Secondary | ICD-10-CM

## 2023-08-27 NOTE — Therapy (Signed)
 OUTPATIENT PHYSICAL THERAPY TREATMENT   Patient Name: Tonya [name] MRN: [id number] DOB:1950-09-28, 73 y.o., female Today's Date: 08/27/2023   END OF SESSION:           Past Medical History:  Diagnosis Date   Allergy June 2012   Seasonal   Asthma 2015   Cancer [facility]) 2010   Left Breast   Coronary atherosclerosis due to calcified coronary lesion    GERD (gastroesophageal reflux disease)    Many years ago   History of breast cancer    Hyperlipidemia    Hypertension    Osteopenia    Past Surgical History:  Procedure Laterality Date   ABDOMINAL HYSTERECTOMY  2015   AUGMENTATION MAMMAPLASTY Left    BREAST BIOPSY Right 02/2019   BREAST IMPLANT EXCHANGE     COLONOSCOPY     COSMETIC SURGERY  2010   Breast cancer related   FOOT SURGERY Left 05/25/2020   MASTECTOMY Left    REDUCTION MAMMAPLASTY Right    ROTATOR CUFF REPAIR Right 07/17/2022   TONSILLECTOMY AND ADENOIDECTOMY  1958   Patient Active Problem List   Diagnosis Date Noted   MCI (mild cognitive impairment) 05/31/2023   Alzheimer's disease, unspecified (CODE) (HCC) 05/31/2023   Persistent disorder of initiating or maintaining sleep 04/13/2023   Short-term memory loss 03/01/2023   History of breast cancer 03/01/2023   Acute bursitis of right shoulder 04/21/2022   Other bursal cyst, left ankle and foot 04/21/2022   Coronary artery disease due to lipid rich plaque 09/30/2021   Hepatic steatosis 09/30/2021   Adenoma of left adrenal gland 09/30/2021   Strain of left trapezius muscle 09/30/2021   Hyperlipidemia 05/19/2021   Somatic dysfunction of spine, thoracic 03/26/2021   Chronic left-sided thoracic back pain 12/30/2020   DCIS (ductal carcinoma in situ) 01/26/2020   Low back pain 01/26/2020   Chronic pain of both knees 09/19/2019   Osteopenia    Estrogen deficiency 04/14/2018   Essential hypertension 01/10/2018   Gastroesophageal reflux disease 01/10/2018   Mild intermittent asthma without  complication 01/10/2018   Pain in left lower leg 03/03/2017   Family history of colon cancer 02/23/2017   Herpes simplex vulvovaginitis 02/18/2017   Neuropathy 02/18/2017   Elevated liver enzymes 02/17/2017   Need for Streptococcus pneumoniae vaccination 02/17/2017   Chondromalacia of left patella 12/28/2016   Synovitis of left knee 12/28/2016   Seasonal allergic rhinitis due to fungal spores 09/25/2016   Bone mass 09/24/2016   Chronic hip pain 09/24/2016   Dark yellow-colored urine 09/24/2016   Low oxygen saturation 09/24/2016   Other specified postprocedural states 01/17/2015   Abnormal stress electrocardiogram test 10/20/2013   Breast mass 12/11/2010   Family history of malignant neoplasm of breast 12/11/2010    PCP: Sharlene Dory, DO   REFERRING PROVIDER: Sharlene Dory, DO  REFERRING DIAG: [phone] (ICD-10-CM) - Bilateral shoulder pain, unspecified chronicity   THERAPY DIAG:  Chronic right shoulder pain  Chronic left shoulder pain  Muscle weakness (generalized)  Other low back pain  Other symptoms and signs involving the musculoskeletal system  Acute pain of right shoulder  RATIONALE FOR EVALUATION AND TREATMENT: Rehabilitation  ONSET DATE: chronic- had initial rotator cuff surgery January 2024  NEXT MD VISIT: Referring PRN    SUBJECTIVE:  SUBJECTIVE STATEMENT: Pt reports not too bad today, a little bit of bicep soreness and TTP but the scapular area not too bad.   EVAL:  I'm having issues with both shoulders, R>L. Had R rotator cuff repair in the past (January 2024), that one hurts worse and now the left is hurting because I have started using my left arm more. Upper back is sore if I take a deep breath and if I shrug my shoulders. Feels  like my right shoulder blade is "out". Muscle directly below my shoulder blade can go numb sometimes. Both arms ache at times all the way down to elbows and sometimes forearms. Feel very weak. Having some issues with my legs as well, I hurt everywhere in my body. When I try to reach across myself I get a bad pinch in my shoulder.   PAIN: Are you having pain? Yes: NPRS scale: 0/10  Pain location: R shoulder/upper arm; base of skull  Pain description: sharp and sore  Aggravating factors: movement in general  Relieving factors: not moving, ice, heat   PERTINENT HISTORY:  R RCR 07/20/22, osteopenia, L breast cancer, HTN, CAD, Alzheimer's dementia, LBP, sciatica, neuropathy, fatigue  PRECAUTIONS: None  RED FLAGS: None  HAND DOMINANCE: Right  WEIGHT BEARING RESTRICTIONS: No  FALLS:  Has patient fallen in last 6 months? No  LIVING ENVIRONMENT: Lives with: lives with their spouse Lives in: House/apartment  OCCUPATION: retired - used to work in Automotive engineer  PLOF: Independent, Independent with basic ADLs, Independent with gait, and Independent with transfers  PATIENT GOALS:  address pain, be able to do more, be able to exercise to lose weight/tolerate exercise better    OBJECTIVE: (objective measures completed at initial evaluation unless otherwise dated)  DIAGNOSTIC FINDINGS:  06/11/22 - R shoulder MRI: IMPRESSION: 1. Severe tendinosis of the supraspinatus tendon with a large full-thickness, near complete, tear with 3.8 cm of retraction and a few intact anterior most fibers. 2. Moderate tendinosis of the infraspinatus tendon with a tiny interstitial tear. 3. Mild tendinosis of the intra-articular portion of the long head of the biceps tendon. 04/21/22 - DG R shoulder: IMPRESSION: No fracture or dislocation of the right shoulder. Mild acromioclavicular and glenohumeral arthrosis.  PATIENT SURVEYS:  Quick Dash 59.1%  COGNITION: Overall cognitive status: History of  cognitive impairments - at baseline mild cognitive impairment at baseline per chart    POSTURE: rounded shoulders, forward head, and increased thoracic kyphosis  UPPER EXTREMITY ROM:   Active ROM Right eval Left eval R 08/27/23 L 08/27/23  Shoulder flexion 145 160 153 163  Shoulder extension      Shoulder abduction 145 170 145 173  Shoulder adduction      Shoulder internal rotation L4  T7 T12 T6  Shoulder external rotation C4 C7 T2 T2  Elbow flexion      Elbow extension      Wrist flexion      Wrist extension      Wrist ulnar deviation      Wrist radial deviation      Wrist pronation      Wrist supination      (Blank rows = not tested)  UPPER/LOWER EXTREMITY MMT:  MMT Right eval Left eval  Shoulder flexion 3+ 4  Shoulder extension    Shoulder abduction 3 4  Shoulder adduction    Shoulder internal rotation 4 4  Shoulder external rotation 3 4  Middle trapezius    Lower trapezius    Elbow flexion  Elbow extension    Wrist flexion    Wrist extension    Wrist ulnar deviation    Wrist radial deviation    Wrist pronation    Wrist supination    Grip strength (lbs)    Hip flexion  5 5  Quads  5 5  Hamstrings  4+ 4+  Ankle dorsiflexion  5 5  (Blank rows = not tested)  PALPATION:  Extremely tight in all paraspinals, upper traps, R anterior shoulder and biceps, suboccipitals     TODAY'S TREATMENT:  08/27/23 NEUROMUSCULAR RE-EDUCATION:  UBE - L2.0 x 6 min (3' fwd & 3' back) Bicep stretch 3x10" Rows 20lb 2x10  Lat pulldowns 15lb 2x10 Shld ext 10lb 2x10 Standing shoulder flexion 1lb x 10 Therapeutic Activity: AROM measured for shoulders   Massage gun IASTM to R  bicep muscle belly 08/23/23 THERAPEUTIC EXERCISE: To improve strength, endurance, and ROM.  Demonstration, verbal and tactile cues throughout for technique.  UBE - L1.5 x 6 min (3' fwd & 3' back) - more uncomfortable with backwards Wall angels x 10  Wall flexion slide Bil x 10 Seated rows 10lb high grips  2x10 Shoulder extension 10lb 2x10  Massage gun IASTM to R rhomboids, thoracic paraspinals, bicep muscle belly 08/16/23 THERAPEUTIC EXERCISE: To improve strength, endurance, and ROM.  Demonstration, verbal and tactile cues throughout for technique.  UBE - L2.0 x 6 min (3' fwd & 3' back) - more uncomfortable with backwards SNAG for cervical extension and rotation x 10 bil Standing thoracic ext x 10 Seated rows 10lb high grips x 10  MANUAL THERAPY: Suboccipital release, TPR/STM to to UT, LS, rhomboids bil  08/12/23 THERAPEUTIC EXERCISE: To improve strength, endurance, and ROM.  Demonstration, verbal and tactile cues throughout for technique.  UBE - L2.0 x 6 min (4' fwd & 2' back) - more uncomfortable with backwards Review of positional pectoralis/postural stretches and thoracic mobilization using pool noodle/towel roll in hooklying  MANUAL THERAPY: To promote normalized muscle tension, improved flexibility, improved joint mobility, increased ROM, and reduced pain utilizing joint mobilization, connective tissue massage, therapeutic massage, and manual TP therapy.  STM/DTM and manual TPR to R pec minor and major, R anterior deltoid and biceps, R teres group, R subscapularis and rhomboids/lower traps R scapular mobilization - emphasis on retraction and depression  Provided instruction in self-STM techniques to R anterior and posterior shoulder complex using tennis ball on wall - ball placed in pillowcase (or sock) to assist with positioning for posterior shoulder.   NEUROMUSCULAR RE-EDUCATION: To improve kinesthesia and posture.  Kinesiotaping for R shoulder impingement, deltoid inhibition and to promote improved posture and scapular engagement - 3 Y strips - 1st from lateral shoulder along spine of scapula, 2nd from distal to deltoid wrapping ant & post to top of shoulder, 3rd from pecs to lateral shoulder.  Wearing and removal instructions provided to patient.   08/09/23 THERAPEUTIC EXERCISE:  To improve strength and flexibility.  Demonstration, verbal and tactile cues throughout for technique.  UBE - L1.5 x 6 min (3' fwd 2 back) Supine chin tucks x 20 Supine shoulder diagonals YTB x 10 each way Supine shoulder ER YTB- p! Standing rows and ext RTB- review  MANUAL THERAPY: To promote normalized muscle tension, improved flexibility, improved joint mobility, increased ROM, and reduced pain utilizing joint mobilization, connective tissue massage, therapeutic massage, and manual TP therapy. STM/DTM and manual TPR to bil  L LS and rhomboids, suboccipital release   08/04/23 THERAPEUTIC EXERCISE: To improve strength and  flexibility.  Demonstration, verbal and tactile cues throughout for technique.  UBE - L1.5 x 6 min (3' fwd & back) - pt reports more pain in anterior upper arm with backwards motion Seated R anterior deltoid/biceps stretch 2 x 30" (shoulder extended with long-arm ER and hand facing back on mat table + fwd trunk weight shift and slight L rotation) Seated scap retraction into pool noodle + RTB B shoulder horiz ABD 2 x 10 Seated scap retraction into pool noodle + RTB B shoulder ER 2 x 10  MANUAL THERAPY: To promote normalized muscle tension, improved flexibility, improved joint mobility, increased ROM, and reduced pain utilizing joint mobilization, connective tissue massage, therapeutic massage, and manual TP therapy. STM/DTM and manual TPR to R anterior deltoid and biceps, L LS and rhomboids R/L scapular mobilization - emphasis on retraction and depression    PATIENT EDUCATION:  Education details: HEP update, postural awareness, self-STM techniques to R shoulder complex using tennis ball on wall, and Ktape wearing and removal instructions  Person educated: Patient Education method: Explanation, Demonstration, Tactile cues, Verbal cues, and Handouts Education comprehension: verbalized understanding, returned demonstration, verbal cues required, tactile cues required, and  needs further education  HOME EXERCISE PROGRAM: Access Code: [email or web address] URL: [email or web address] Date: 08/12/2023 Prepared by: Glenetta Hew  Exercises - Seated Gentle Upper Trapezius Stretch  - 2 x daily - 7 x weekly - 3 reps - 30 sec hold - Gentle Levator Scapulae Stretch (Mirrored)  - 2 x daily - 7 x weekly - 3 reps - 30 sec hold - Standing Bilateral Low Shoulder Row with Anchored Resistance  - 1 x daily - 7 x weekly - 2 sets - 10 reps - 5 sec hold - Scapular Retraction with Resistance Advanced  - 1 x daily - 7 x weekly - 2 sets - 10 reps - 5 sec hold - Seated Thoracic Lumbar Extension  - 1 x daily - 7 x weekly - 2 sets - 10 reps - Seated Cervical Retraction  - 1 x daily - 7 x weekly - 2 sets - 10 reps - Bicep Stretch at Table  - 2 x daily - 7 x weekly - 2 sets - 10 reps - 3 sec hold - Doorway Pec Stretch at 60 Degrees Abduction with Arm Straight  - 2 x daily - 7 x weekly - 3 reps - 30 sec hold - Supine Thoracic Mobilization Towel Roll Vertical with Arm Stretch  - 1 x daily - 7 x weekly - 2 sets - 10 reps - 3 sec hold - Standing Pectoral Release with Ball at Wall  - 1-2 x daily - 7 x weekly - 1-2 min hold - Standing Infraspinatus/Teres Minor Release with Ball at [employer]  - 1-2 x daily - 7 x weekly - 1-2 min hold  Patient Education - Kinesiology tape   ASSESSMENT:  CLINICAL IMPRESSION: Pt shows improvement in her AROM of bil shoulders. Cues provided to correct her posture with exercises and for correct form. Continued focus on postural strength and motor control o postural musculature. She is now able to do her hair with no pain so met her final STG today. Bernice will benefit from continued skilled PT to address ongoing abnormal muscle tension, ROM and strength deficits to improve mobility and activity tolerance with decreased pain interference.     OBJECTIVE IMPAIRMENTS: Abnormal gait, decreased activity tolerance, decreased balance, decreased mobility, difficulty walking,  decreased ROM, decreased strength, increased fascial restrictions, impaired perceived functional ability, increased muscle spasms,  impaired UE functional use, postural dysfunction, and pain.   ACTIVITY LIMITATIONS: carrying, lifting, standing, squatting, stairs, transfers, reach over head, locomotion level, and caring for others  PARTICIPATION LIMITATIONS: meal prep, cleaning, laundry, driving, shopping, community activity, and yard work  PERSONAL FACTORS: Age, Behavior pattern, Education, Fitness, Past/current experiences, Social background, and Time since onset of injury/illness/exacerbation are also affecting patient's functional outcome.   REHAB POTENTIAL: Fair ongoing issues with pain since surgery January 2024, gross deconditioning, not compliant with HEP since DC from PT   CLINICAL DECISION MAKING: Stable/uncomplicated  EVALUATION COMPLEXITY: Low   GOALS: Goals reviewed with patient? Yes  SHORT TERM GOALS: Target date: 08/11/2023    Patient will be independent with initial HEP to improve outcomes and carryover.  Baseline:  Goal status: MET - 08/02/23  2.  Patient will report 25% improvement in B shoulder pain to improve QOL.   Baseline:  Goal status: MET- 08/16/23  3.  Will be able to dress/perform all self care activities without increased pain in neck and shoulders. Baseline:  Goal status: MET - 08/27/23- no difficulty, no pain  LONG TERM GOALS: Target date: 09/08/2023   Patient will be independent with ongoing/advanced HEP for self-management at home.  Baseline:  Goal status: IN PROGRESS  2.  Patient will report 50-75% improvement in B shoulder pain to improve QOL.  Baseline:  Goal status: IN PROGRESS  3.  Patient to demonstrate improved upright posture with posterior shoulder girdle engaged to promote improved glenohumeral joint mobility. Baseline:  Goal status: IN PROGRESS  4.  R shoulder AROM to be equal to L without pain provocation to allow for increased ease  of ADLs.  Baseline:  Goal status: IN PROGRESS  5.  MMT will have improved by at least one grade in all weak groups  Goal status: IN PROGRESS  6  QuickDASH to have improved by at least 15 points to show improved QOL/subjective improvement  Baseline: 59.1% Goal status: IN PROGRESS  7.  Patient will experience at least a 50% improvement in sciatica and low back discomfort    Baseline:  Goal status: IN PROGRESS   8. Will be able to perform exercise routine as desired without increase in pain in shoulders/back/LEs  Baseline:  Goal status: IN PROGRESS   PLAN:  PT FREQUENCY: 1-2x/week  PT DURATION: 8 weeks  PLANNED INTERVENTIONS: 97164- PT Re-evaluation, 97110-Therapeutic exercises, 97530- Therapeutic activity, [id number]- Neuromuscular re-education, 97535- Self Care, [id number]- Manual therapy, [id number]- Aquatic Therapy, 97014- Electrical stimulation (unattended), 97033- Ionotophoresis 4mg /ml Dexamethasone, Taping, Dry Needling, Cryotherapy, and Moist heat  PLAN FOR NEXT SESSION:  shoulder strength/postural strength - reapplication of Kinesiotape as benefit noted to promote normalization of muscle activation and improved alignment, proximal and core strength and LE stretching as appropriate, general conditioning. Does not want dry needling.  Might benefit from water PT if progress is slow in land PT and she is agreeable, has been to [facility] clinic before.    Darleene Cleaver, PTA  08/27/23 8:51 AM

## 2023-08-30 ENCOUNTER — Ambulatory Visit: Payer: PPO

## 2023-08-30 DIAGNOSIS — M5459 Other low back pain: Secondary | ICD-10-CM

## 2023-08-30 DIAGNOSIS — G8929 Other chronic pain: Secondary | ICD-10-CM

## 2023-08-30 DIAGNOSIS — R29898 Other symptoms and signs involving the musculoskeletal system: Secondary | ICD-10-CM

## 2023-08-30 DIAGNOSIS — M25511 Pain in right shoulder: Secondary | ICD-10-CM | POA: Diagnosis not present

## 2023-08-30 DIAGNOSIS — M6281 Muscle weakness (generalized): Secondary | ICD-10-CM

## 2023-08-30 NOTE — Therapy (Signed)
 OUTPATIENT PHYSICAL THERAPY TREATMENT Progress Note Reporting Period 07/14/2023 to 08/30/2023  See note below for Objective Data and Assessment of Progress/Goals.   I have reviewed progress and goals and current plan of care is appropriate.   Jena Gauss, PT     Patient Name: Tonya [name] MRN: [id number] DOB:May 30, 1951, 73 y.o., female Today's Date: 08/30/2023   END OF SESSION:  PT End of Session - 08/30/23 1235     Visit Number 10    Date for PT Re-Evaluation 09/08/23    Authorization Type HTA    Progress Note Due on Visit 10    PT Start Time 1146    PT Stop Time 1231    PT Time Calculation (min) 45 min    Activity Tolerance Patient tolerated treatment well    Behavior During Therapy [facility] for tasks assessed/performed                     Past Medical History:  Diagnosis Date   Allergy June 2012   Seasonal   Asthma 2015   Cancer [facility]) 2010   Left Breast   Coronary atherosclerosis due to calcified coronary lesion    GERD (gastroesophageal reflux disease)    Many years ago   History of breast cancer    Hyperlipidemia    Hypertension    Osteopenia    Past Surgical History:  Procedure Laterality Date   ABDOMINAL HYSTERECTOMY  2015   AUGMENTATION MAMMAPLASTY Left    BREAST BIOPSY Right 02/2019   BREAST IMPLANT EXCHANGE     COLONOSCOPY     COSMETIC SURGERY  2010   Breast cancer related   FOOT SURGERY Left 05/25/2020   MASTECTOMY Left    REDUCTION MAMMAPLASTY Right    ROTATOR CUFF REPAIR Right 07/17/2022   TONSILLECTOMY AND ADENOIDECTOMY  1958   Patient Active Problem List   Diagnosis Date Noted   MCI (mild cognitive impairment) 05/31/2023   Alzheimer's disease, unspecified (CODE) (HCC) 05/31/2023   Persistent disorder of initiating or maintaining sleep 04/13/2023   Short-term memory loss 03/01/2023   History of breast cancer 03/01/2023   Acute bursitis of right shoulder 04/21/2022   Other bursal cyst, left ankle and foot 04/21/2022    Coronary artery disease due to lipid rich plaque 09/30/2021   Hepatic steatosis 09/30/2021   Adenoma of left adrenal gland 09/30/2021   Strain of left trapezius muscle 09/30/2021   Hyperlipidemia 05/19/2021   Somatic dysfunction of spine, thoracic 03/26/2021   Chronic left-sided thoracic back pain 12/30/2020   DCIS (ductal carcinoma in situ) 01/26/2020   Low back pain 01/26/2020   Chronic pain of both knees 09/19/2019   Osteopenia    Estrogen deficiency 04/14/2018   Essential hypertension 01/10/2018   Gastroesophageal reflux disease 01/10/2018   Mild intermittent asthma without complication 01/10/2018   Pain in left lower leg 03/03/2017   Family history of colon cancer 02/23/2017   Herpes simplex vulvovaginitis 02/18/2017   Neuropathy 02/18/2017   Elevated liver enzymes 02/17/2017   Need for Streptococcus pneumoniae vaccination 02/17/2017   Chondromalacia of left patella 12/28/2016   Synovitis of left knee 12/28/2016   Seasonal allergic rhinitis due to fungal spores 09/25/2016   Bone mass 09/24/2016   Chronic hip pain 09/24/2016   Dark yellow-colored urine 09/24/2016   Low oxygen saturation 09/24/2016   Other specified postprocedural states 01/17/2015   Abnormal stress electrocardiogram test 10/20/2013   Breast mass 12/11/2010   Family history of malignant neoplasm of breast  12/11/2010    PCP: Sharlene Dory, DO   REFERRING PROVIDER: Sharlene Dory, DO  REFERRING DIAG: [phone] (ICD-10-CM) - Bilateral shoulder pain, unspecified chronicity   THERAPY DIAG:  Chronic right shoulder pain  Chronic left shoulder pain  Muscle weakness (generalized)  Other low back pain  Other symptoms and signs involving the musculoskeletal system  Acute pain of right shoulder  RATIONALE FOR EVALUATION AND TREATMENT: Rehabilitation  ONSET DATE: chronic- had initial rotator cuff surgery January 2024  NEXT MD VISIT: Referring PRN    SUBJECTIVE:                                                                                                                                                                                                          SUBJECTIVE STATEMENT: Doing good today.  EVAL:  I'm having issues with both shoulders, R>L. Had R rotator cuff repair in the past (January 2024), that one hurts worse and now the left is hurting because I have started using my left arm more. Upper back is sore if I take a deep breath and if I shrug my shoulders. Feels like my right shoulder blade is "out". Muscle directly below my shoulder blade can go numb sometimes. Both arms ache at times all the way down to elbows and sometimes forearms. Feel very weak. Having some issues with my legs as well, I hurt everywhere in my body. When I try to reach across myself I get a bad pinch in my shoulder.   PAIN: Are you having pain? Yes: NPRS scale: 0/10  Pain location: R shoulder/upper arm; base of skull  Pain description: sharp and sore  Aggravating factors: movement in general  Relieving factors: not moving, ice, heat   PERTINENT HISTORY:  R RCR 07/20/22, osteopenia, L breast cancer, HTN, CAD, Alzheimer's dementia, LBP, sciatica, neuropathy, fatigue  PRECAUTIONS: None  RED FLAGS: None  HAND DOMINANCE: Right  WEIGHT BEARING RESTRICTIONS: No  FALLS:  Has patient fallen in last 6 months? No  LIVING ENVIRONMENT: Lives with: lives with their spouse Lives in: House/apartment  OCCUPATION: retired - used to work in Automotive engineer  PLOF: Independent, Independent with basic ADLs, Independent with gait, and Independent with transfers  PATIENT GOALS:  address pain, be able to do more, be able to exercise to lose weight/tolerate exercise better    OBJECTIVE: (objective measures completed at initial evaluation unless otherwise dated)  DIAGNOSTIC FINDINGS:  06/11/22 - R shoulder MRI: IMPRESSION: 1. Severe tendinosis of the supraspinatus tendon with a large  full-thickness, near complete, tear with 3.8  cm of retraction and a few intact anterior most fibers. 2. Moderate tendinosis of the infraspinatus tendon with a tiny interstitial tear. 3. Mild tendinosis of the intra-articular portion of the long head of the biceps tendon. 04/21/22 - DG R shoulder: IMPRESSION: No fracture or dislocation of the right shoulder. Mild acromioclavicular and glenohumeral arthrosis.  PATIENT SURVEYS:  Quick Dash 59.1%  COGNITION: Overall cognitive status: History of cognitive impairments - at baseline mild cognitive impairment at baseline per chart    POSTURE: rounded shoulders, forward head, and increased thoracic kyphosis  UPPER EXTREMITY ROM:   Active ROM Right eval Left eval R 08/27/23 L 08/27/23  Shoulder flexion 145 160 153 163  Shoulder extension      Shoulder abduction 145 170 145 173  Shoulder adduction      Shoulder internal rotation L4  T7 T12 T6  Shoulder external rotation C4 C7 T2 T2  Elbow flexion      Elbow extension      Wrist flexion      Wrist extension      Wrist ulnar deviation      Wrist radial deviation      Wrist pronation      Wrist supination      (Blank rows = not tested)  UPPER/LOWER EXTREMITY MMT:  MMT Right eval Left eval R 08/30/23 L 08/30/23  Shoulder flexion 3+ 4 4 4+  Shoulder extension      Shoulder abduction 3 4 4- 4+  Shoulder adduction      Shoulder internal rotation 4 4 4+ 5  Shoulder external rotation 3 4 4 5   Middle trapezius      Lower trapezius      Elbow flexion      Elbow extension      Wrist flexion      Wrist extension      Wrist ulnar deviation      Wrist radial deviation      Wrist pronation      Wrist supination      Grip strength (lbs)      Hip flexion  5 5    Quads  5 5    Hamstrings  4+ 4+    Ankle dorsiflexion  5 5    (Blank rows = not tested)  PALPATION:  Extremely tight in all paraspinals, upper traps, R anterior shoulder and biceps, suboccipitals     TODAY'S TREATMENT:   08/30/23 Therapeutic Activity: Assessment of goals for 10th visit note NEUROMUSCULAR RE-EDUCATION:  UBE - L2.0 x 6 min (3' fwd & 3' back) Rows 20lb 2x10 mid grip  Standing shld ext 15lb x 10- inc strain on collarbone Open books x 10 L side; 5x R -p! Afterwards MANUAL THERAPY: STM to R bicep attachment to coracoid, UT,LS Anterior shoulder stretch  08/27/23 NEUROMUSCULAR RE-EDUCATION:  UBE - L2.0 x 6 min (3' fwd & 3' back) Bicep stretch 3x10" Rows 20lb 2x10  Lat pulldowns 15lb 2x10 Shld ext 10lb 2x10 Standing shoulder flexion 1lb x 10 Therapeutic Activity: AROM measured for shoulders   Massage gun IASTM to R  bicep muscle belly 08/23/23 THERAPEUTIC EXERCISE: To improve strength, endurance, and ROM.  Demonstration, verbal and tactile cues throughout for technique.  UBE - L1.5 x 6 min (3' fwd & 3' back) - more uncomfortable with backwards Wall angels x 10  Wall flexion slide Bil x 10 Seated rows 10lb high grips 2x10 Shoulder extension 10lb 2x10  Massage gun IASTM to R rhomboids, thoracic paraspinals, bicep  muscle belly 08/16/23 THERAPEUTIC EXERCISE: To improve strength, endurance, and ROM.  Demonstration, verbal and tactile cues throughout for technique.  UBE - L2.0 x 6 min (3' fwd & 3' back) - more uncomfortable with backwards SNAG for cervical extension and rotation x 10 bil Standing thoracic ext x 10 Seated rows 10lb high grips x 10  MANUAL THERAPY: Suboccipital release, TPR/STM to to UT, LS, rhomboids bil  08/12/23 THERAPEUTIC EXERCISE: To improve strength, endurance, and ROM.  Demonstration, verbal and tactile cues throughout for technique.  UBE - L2.0 x 6 min (4' fwd & 2' back) - more uncomfortable with backwards Review of positional pectoralis/postural stretches and thoracic mobilization using pool noodle/towel roll in hooklying  MANUAL THERAPY: To promote normalized muscle tension, improved flexibility, improved joint mobility, increased ROM, and reduced pain utilizing  joint mobilization, connective tissue massage, therapeutic massage, and manual TP therapy.  STM/DTM and manual TPR to R pec minor and major, R anterior deltoid and biceps, R teres group, R subscapularis and rhomboids/lower traps R scapular mobilization - emphasis on retraction and depression  Provided instruction in self-STM techniques to R anterior and posterior shoulder complex using tennis ball on wall - ball placed in pillowcase (or sock) to assist with positioning for posterior shoulder.   NEUROMUSCULAR RE-EDUCATION: To improve kinesthesia and posture.  Kinesiotaping for R shoulder impingement, deltoid inhibition and to promote improved posture and scapular engagement - 3 Y strips - 1st from lateral shoulder along spine of scapula, 2nd from distal to deltoid wrapping ant & post to top of shoulder, 3rd from pecs to lateral shoulder.  Wearing and removal instructions provided to patient.   08/09/23 THERAPEUTIC EXERCISE: To improve strength and flexibility.  Demonstration, verbal and tactile cues throughout for technique.  UBE - L1.5 x 6 min (3' fwd 2 back) Supine chin tucks x 20 Supine shoulder diagonals YTB x 10 each way Supine shoulder ER YTB- p! Standing rows and ext RTB- review  MANUAL THERAPY: To promote normalized muscle tension, improved flexibility, improved joint mobility, increased ROM, and reduced pain utilizing joint mobilization, connective tissue massage, therapeutic massage, and manual TP therapy. STM/DTM and manual TPR to bil  L LS and rhomboids, suboccipital release   08/04/23 THERAPEUTIC EXERCISE: To improve strength and flexibility.  Demonstration, verbal and tactile cues throughout for technique.  UBE - L1.5 x 6 min (3' fwd & back) - pt reports more pain in anterior upper arm with backwards motion Seated R anterior deltoid/biceps stretch 2 x 30" (shoulder extended with long-arm ER and hand facing back on mat table + fwd trunk weight shift and slight L rotation) Seated  scap retraction into pool noodle + RTB B shoulder horiz ABD 2 x 10 Seated scap retraction into pool noodle + RTB B shoulder ER 2 x 10  MANUAL THERAPY: To promote normalized muscle tension, improved flexibility, improved joint mobility, increased ROM, and reduced pain utilizing joint mobilization, connective tissue massage, therapeutic massage, and manual TP therapy. STM/DTM and manual TPR to R anterior deltoid and biceps, L LS and rhomboids R/L scapular mobilization - emphasis on retraction and depression    PATIENT EDUCATION:  Education details: HEP update, postural awareness, self-STM techniques to R shoulder complex using tennis ball on wall, and Ktape wearing and removal instructions  Person educated: Patient Education method: Explanation, Demonstration, Tactile cues, Verbal cues, and Handouts Education comprehension: verbalized understanding, returned demonstration, verbal cues required, tactile cues required, and needs further education  HOME EXERCISE PROGRAM: Access Code: [email or web address] URL: [email or web address]  Date: 08/12/2023 Prepared by: Glenetta Hew  Exercises - Seated Gentle Upper Trapezius Stretch  - 2 x daily - 7 x weekly - 3 reps - 30 sec hold - Gentle Levator Scapulae Stretch (Mirrored)  - 2 x daily - 7 x weekly - 3 reps - 30 sec hold - Standing Bilateral Low Shoulder Row with Anchored Resistance  - 1 x daily - 7 x weekly - 2 sets - 10 reps - 5 sec hold - Scapular Retraction with Resistance Advanced  - 1 x daily - 7 x weekly - 2 sets - 10 reps - 5 sec hold - Seated Thoracic Lumbar Extension  - 1 x daily - 7 x weekly - 2 sets - 10 reps - Seated Cervical Retraction  - 1 x daily - 7 x weekly - 2 sets - 10 reps - Bicep Stretch at Table  - 2 x daily - 7 x weekly - 2 sets - 10 reps - 3 sec hold - Doorway Pec Stretch at 60 Degrees Abduction with Arm Straight  - 2 x daily - 7 x weekly - 3 reps - 30 sec hold - Supine Thoracic Mobilization Towel Roll Vertical with Arm  Stretch  - 1 x daily - 7 x weekly - 2 sets - 10 reps - 3 sec hold - Standing Pectoral Release with Ball at Wall  - 1-2 x daily - 7 x weekly - 1-2 min hold - Standing Infraspinatus/Teres Minor Release with Ball at [employer]  - 1-2 x daily - 7 x weekly - 1-2 min hold  Patient Education - Kinesiology tape   ASSESSMENT:  CLINICAL IMPRESSION: Pt shows improvement in bil shoulder strength, AROM was assess last visit. She reports at least 50% improvement in her bil shoulder pain. Increased strain on anterior shoulder right over her coracoid process with standing shld ext and open books today. Good response to MT with less pain reported. Progressing well toward goals. Addilyne will benefit from continued skilled PT to address ongoing abnormal muscle tension, ROM and strength deficits to improve mobility and activity tolerance with decreased pain interference.     OBJECTIVE IMPAIRMENTS: Abnormal gait, decreased activity tolerance, decreased balance, decreased mobility, difficulty walking, decreased ROM, decreased strength, increased fascial restrictions, impaired perceived functional ability, increased muscle spasms, impaired UE functional use, postural dysfunction, and pain.   ACTIVITY LIMITATIONS: carrying, lifting, standing, squatting, stairs, transfers, reach over head, locomotion level, and caring for others  PARTICIPATION LIMITATIONS: meal prep, cleaning, laundry, driving, shopping, community activity, and yard work  PERSONAL FACTORS: Age, Behavior pattern, Education, Fitness, Past/current experiences, Social background, and Time since onset of injury/illness/exacerbation are also affecting patient's functional outcome.   REHAB POTENTIAL: Fair ongoing issues with pain since surgery January 2024, gross deconditioning, not compliant with HEP since DC from PT   CLINICAL DECISION MAKING: Stable/uncomplicated  EVALUATION COMPLEXITY: Low   GOALS: Goals reviewed with patient? Yes  SHORT TERM GOALS:  Target date: 08/11/2023    Patient will be independent with initial HEP to improve outcomes and carryover.  Baseline:  Goal status: MET - 08/02/23  2.  Patient will report 25% improvement in B shoulder pain to improve QOL.   Baseline:  Goal status: MET- 08/16/23  3.  Will be able to dress/perform all self care activities without increased pain in neck and shoulders. Baseline:  Goal status: MET - 08/27/23- no difficulty, no pain  LONG TERM GOALS: Target date: 09/08/2023   Patient will be independent with ongoing/advanced HEP for self-management at  home.  Baseline:  Goal status: IN PROGRESS  2.  Patient will report 50-75% improvement in B shoulder pain to improve QOL.  Baseline:  Goal status: MET- 08/30/23  3.  Patient to demonstrate improved upright posture with posterior shoulder girdle engaged to promote improved glenohumeral joint mobility. Baseline:  Goal status: IN PROGRESS  4.  R shoulder AROM to be equal to L without pain provocation to allow for increased ease of ADLs.  Baseline:  Goal status: IN PROGRESS- 08/27/23  5.  MMT will have improved by at least one grade in all weak groups  Goal status: IN PROGRESS- 08/30/23  6  QuickDASH to have improved by at least 15 points to show improved QOL/subjective improvement  Baseline: 59.1% Goal status: IN PROGRESS  7.  Patient will experience at least a 50% improvement in sciatica and low back discomfort    Baseline:  Goal status: IN PROGRESS- 08/30/23 the pain comes and goes, slightly better  8. Will be able to perform exercise routine as desired without increase in pain in shoulders/back/LEs  Baseline:  Goal status: IN PROGRESS   PLAN:  PT FREQUENCY: 1-2x/week  PT DURATION: 8 weeks  PLANNED INTERVENTIONS: 97164- PT Re-evaluation, 97110-Therapeutic exercises, 97530- Therapeutic activity, [id number]- Neuromuscular re-education, 97535- Self Care, 16109- Manual therapy, [id number]- Aquatic Therapy, 97014- Electrical stimulation  (unattended), 97033- Ionotophoresis 4mg /ml Dexamethasone, Taping, Dry Needling, Cryotherapy, and Moist heat  PLAN FOR NEXT SESSION:  shoulder strength/postural strength - reapplication of Kinesiotape as benefit noted to promote normalization of muscle activation and improved alignment, proximal and core strength and LE stretching as appropriate, general conditioning. Does not want dry needling.  Might benefit from water PT if progress is slow in land PT and she is agreeable, has been to [facility] clinic before.    Darleene Cleaver, PTA  08/30/23 12:35 PM

## 2023-08-31 ENCOUNTER — Ambulatory Visit: Admitting: Family Medicine

## 2023-09-03 ENCOUNTER — Encounter: Payer: Self-pay | Admitting: Physical Therapy

## 2023-09-03 ENCOUNTER — Ambulatory Visit: Payer: PPO | Admitting: Physical Therapy

## 2023-09-03 DIAGNOSIS — M25511 Pain in right shoulder: Secondary | ICD-10-CM | POA: Diagnosis not present

## 2023-09-03 DIAGNOSIS — M5459 Other low back pain: Secondary | ICD-10-CM

## 2023-09-03 DIAGNOSIS — R29898 Other symptoms and signs involving the musculoskeletal system: Secondary | ICD-10-CM

## 2023-09-03 DIAGNOSIS — G8929 Other chronic pain: Secondary | ICD-10-CM

## 2023-09-03 DIAGNOSIS — M6281 Muscle weakness (generalized): Secondary | ICD-10-CM

## 2023-09-03 NOTE — Therapy (Signed)
 OUTPATIENT PHYSICAL THERAPY TREATMENT / RE-CERTIFICATION  Progress Note  Reporting Period 07/14/2023 to 09/03/2023   See note below for Objective Data and Assessment of Progress/Goals.     Patient Name: Tonya [name] MRN: [id number] DOB:1950/06/29, 73 y.o., female Today's Date: 09/03/2023   END OF SESSION:  PT End of Session - 09/03/23 1021     Visit Number 11    Date for PT Re-Evaluation 10/15/23    Authorization Type HTA    Progress Note Due on Visit 21   Recert/PN on visit #11 - 09/03/23   PT Start Time 1021   Pt arrived late   PT Stop Time 1103    PT Time Calculation (min) 42 min    Activity Tolerance Patient tolerated treatment well    Behavior During Therapy [facility] for tasks assessed/performed                 Past Medical History:  Diagnosis Date   Allergy June 2012   Seasonal   Asthma 2015   Cancer [facility]) 2010   Left Breast   Coronary atherosclerosis due to calcified coronary lesion    GERD (gastroesophageal reflux disease)    Many years ago   History of breast cancer    Hyperlipidemia    Hypertension    Osteopenia    Past Surgical History:  Procedure Laterality Date   ABDOMINAL HYSTERECTOMY  2015   AUGMENTATION MAMMAPLASTY Left    BREAST BIOPSY Right 02/2019   BREAST IMPLANT EXCHANGE     COLONOSCOPY     COSMETIC SURGERY  2010   Breast cancer related   FOOT SURGERY Left 05/25/2020   MASTECTOMY Left    REDUCTION MAMMAPLASTY Right    ROTATOR CUFF REPAIR Right 07/17/2022   TONSILLECTOMY AND ADENOIDECTOMY  1958   Patient Active Problem List   Diagnosis Date Noted   MCI (mild cognitive impairment) 05/31/2023   Alzheimer's disease, unspecified (CODE) (HCC) 05/31/2023   Persistent disorder of initiating or maintaining sleep 04/13/2023   Short-term memory loss 03/01/2023   History of breast cancer 03/01/2023   Acute bursitis of right shoulder 04/21/2022   Other bursal cyst, left ankle and foot 04/21/2022   Coronary artery disease due to lipid rich  plaque 09/30/2021   Hepatic steatosis 09/30/2021   Adenoma of left adrenal gland 09/30/2021   Strain of left trapezius muscle 09/30/2021   Hyperlipidemia 05/19/2021   Somatic dysfunction of spine, thoracic 03/26/2021   Chronic left-sided thoracic back pain 12/30/2020   DCIS (ductal carcinoma in situ) 01/26/2020   Low back pain 01/26/2020   Chronic pain of both knees 09/19/2019   Osteopenia    Estrogen deficiency 04/14/2018   Essential hypertension 01/10/2018   Gastroesophageal reflux disease 01/10/2018   Mild intermittent asthma without complication 01/10/2018   Pain in left lower leg 03/03/2017   Family history of colon cancer 02/23/2017   Herpes simplex vulvovaginitis 02/18/2017   Neuropathy 02/18/2017   Elevated liver enzymes 02/17/2017   Need for Streptococcus pneumoniae vaccination 02/17/2017   Chondromalacia of left patella 12/28/2016   Synovitis of left knee 12/28/2016   Seasonal allergic rhinitis due to fungal spores 09/25/2016   Bone mass 09/24/2016   Chronic hip pain 09/24/2016   Dark yellow-colored urine 09/24/2016   Low oxygen saturation 09/24/2016   Other specified postprocedural states 01/17/2015   Abnormal stress electrocardiogram test 10/20/2013   Breast mass 12/11/2010   Family history of malignant neoplasm of breast 12/11/2010    PCP: Sharlene Dory, DO  REFERRING PROVIDER: Sharlene Dory, DO  REFERRING DIAG: [phone] (ICD-10-CM) - Bilateral shoulder pain, unspecified chronicity   THERAPY DIAG:  Chronic right shoulder pain  Chronic left shoulder pain  Muscle weakness (generalized)  Other low back pain  Other symptoms and signs involving the musculoskeletal system  RATIONALE FOR EVALUATION AND TREATMENT: Rehabilitation  ONSET DATE: chronic- had initial rotator cuff surgery January 2024  NEXT MD VISIT: Referring PRN    SUBJECTIVE:                                                                                                                                                                                                          SUBJECTIVE STATEMENT: Pt notes her elbow was "pinching" for a few days following her last PT visit, but now resolved.  EVAL:  I'm having issues with both shoulders, R>L. Had R rotator cuff repair in the past (January 2024), that one hurts worse and now the left is hurting because I have started using my left arm more. Upper back is sore if I take a deep breath and if I shrug my shoulders. Feels like my right shoulder blade is "out". Muscle directly below my shoulder blade can go numb sometimes. Both arms ache at times all the way down to elbows and sometimes forearms. Feel very weak. Having some issues with my legs as well, I hurt everywhere in my body. When I try to reach across myself I get a bad pinch in my shoulder.   PAIN: Are you having pain? No and Yes: NPRS scale: 0/10  Pain location: R shoulder/upper arm; base of skull  Pain description: sharp and sore  Aggravating factors: movement in general  Relieving factors: not moving, ice, heat   PERTINENT HISTORY:  R RCR 07/20/22, osteopenia, L breast cancer, HTN, CAD, Alzheimer's dementia, LBP, sciatica, neuropathy, fatigue  PRECAUTIONS: None  RED FLAGS: None  HAND DOMINANCE: Right  WEIGHT BEARING RESTRICTIONS: No  FALLS:  Has patient fallen in last 6 months? No  LIVING ENVIRONMENT: Lives with: lives with their spouse Lives in: House/apartment  OCCUPATION: retired - used to work in Automotive engineer  PLOF: Independent, Independent with basic ADLs, Independent with gait, and Independent with transfers  PATIENT GOALS:  address pain, be able to do more, be able to exercise to lose weight/tolerate exercise better    OBJECTIVE: (objective measures completed at initial evaluation unless otherwise dated)  DIAGNOSTIC FINDINGS:  06/11/22 - R shoulder MRI: IMPRESSION: 1. Severe tendinosis of the supraspinatus tendon  with a large full-thickness, near complete, tear with 3.8  cm of retraction and a few intact anterior most fibers. 2. Moderate tendinosis of the infraspinatus tendon with a tiny interstitial tear. 3. Mild tendinosis of the intra-articular portion of the long head of the biceps tendon. 04/21/22 - DG R shoulder: IMPRESSION: No fracture or dislocation of the right shoulder. Mild acromioclavicular and glenohumeral arthrosis.  PATIENT SURVEYS:  Neldon Mc 59.1%  09/03/23 - QuickDASH: 20.5 / 100 = 20.5 %  COGNITION: Overall cognitive status: History of cognitive impairments - at baseline mild cognitive impairment at baseline per chart    POSTURE: rounded shoulders, forward head, and increased thoracic kyphosis  UPPER EXTREMITY ROM:   Active ROM Right eval Left eval R 08/27/23 L 08/27/23  Shoulder flexion 145 160 153 163  Shoulder extension      Shoulder abduction 145 170 145 173  Shoulder adduction      Shoulder internal rotation L4  T7 T12 T6  Shoulder external rotation C4 C7 T2 T2  Elbow flexion      Elbow extension      Wrist flexion      Wrist extension      Wrist ulnar deviation      Wrist radial deviation      Wrist pronation      Wrist supination      (Blank rows = not tested)  UPPER/LOWER EXTREMITY MMT:  MMT Right eval Left eval R 08/30/23 L 08/30/23 R 09/03/23 L 09/03/23  Shoulder flexion 3+ 4 4 4+    Shoulder extension     4+ 4  Shoulder abduction 3 4 4- 4+    Shoulder adduction        Shoulder internal rotation 4 4 4+ 5    Shoulder external rotation 3 4 4 5     Middle trapezius     4+ 4+  Lower trapezius     4- 4  (Blank rows = not tested)  MMT Right eval Left eval R 09/03/23 L 09/03/23  Hip flexion 5 5 5 5   Hip extension   4+ 4+  Hip abduction   4+ 4+  Hip adduction   4+ 4+  Hip internal rotation   4 4  Hip external rotation   4 4  Knee flexion 4+ 4+ 5 5  Knee extension 5 5 5 5   Ankle dorsiflexion 5 5 5 5   Ankle plantarflexion      Ankle inversion      Ankle  eversion       (Blank rows = not tested)   PALPATION:  Extremely tight in all paraspinals, upper traps, R anterior shoulder and biceps, suboccipitals     TODAY'S TREATMENT:   09/03/23 THERAPEUTIC EXERCISE: To improve strength and endurance.  Demonstration, verbal and tactile cues throughout for technique.  UBE - L2.0 x 6 min (3' fwd & 3' back)  THERAPEUTIC ACTIVITIES: To improve functional performance.  Demonstration, verbal and tactile cues throughout for technique. QuickDASH: 20.5 / 100 = 20.5 % MMT: scapular musculature & LE MMT  MANUAL THERAPY: To promote normalized muscle tension, increased ROM, and reduced pain utilizing connective tissue massage, therapeutic massage, and manual TP therapy. STM/DTM and manual TPR to R rhomboid, lower traps and subscapularis Reviewed options for self STM using ball on wall vs TheraCane   08/30/23 Therapeutic Activity: Assessment of goals for 10th visit note NEUROMUSCULAR RE-EDUCATION:  UBE - L2.0 x 6 min (3' fwd & 3' back) Rows 20lb 2x10 mid grip  Standing shld ext 15lb x 10- inc strain on  collarbone Open books x 10 L side; 5x R -p! Afterwards MANUAL THERAPY: STM to R bicep attachment to coracoid, UT,LS Anterior shoulder stretch   08/27/23 NEUROMUSCULAR RE-EDUCATION:  UBE - L2.0 x 6 min (3' fwd & 3' back) Bicep stretch 3x10" Rows 20lb 2x10  Lat pulldowns 15lb 2x10 Shld ext 10lb 2x10 Standing shoulder flexion 1lb x 10 Therapeutic Activity: AROM measured for shoulders  Massage gun IASTM to R  bicep muscle belly   PATIENT EDUCATION:  Education details: progress with PT, ongoing PT POC, and self-STM techniques to R shoulder complex using tennis ball on wall or TheraCane   Person educated: Patient Education method: Explanation Education comprehension: verbalized understanding  HOME EXERCISE PROGRAM: Access Code: [email or web address] URL: [email or web address] Date: 08/12/2023 Prepared by: Glenetta Hew  Exercises - Seated  Gentle Upper Trapezius Stretch  - 2 x daily - 7 x weekly - 3 reps - 30 sec hold - Gentle Levator Scapulae Stretch (Mirrored)  - 2 x daily - 7 x weekly - 3 reps - 30 sec hold - Standing Bilateral Low Shoulder Row with Anchored Resistance  - 1 x daily - 7 x weekly - 2 sets - 10 reps - 5 sec hold - Scapular Retraction with Resistance Advanced  - 1 x daily - 7 x weekly - 2 sets - 10 reps - 5 sec hold - Seated Thoracic Lumbar Extension  - 1 x daily - 7 x weekly - 2 sets - 10 reps - Seated Cervical Retraction  - 1 x daily - 7 x weekly - 2 sets - 10 reps - Bicep Stretch at Table  - 2 x daily - 7 x weekly - 2 sets - 10 reps - 3 sec hold - Doorway Pec Stretch at 60 Degrees Abduction with Arm Straight  - 2 x daily - 7 x weekly - 3 reps - 30 sec hold - Supine Thoracic Mobilization Towel Roll Vertical with Arm Stretch  - 1 x daily - 7 x weekly - 2 sets - 10 reps - 3 sec hold - Standing Pectoral Release with Ball at Wall  - 1-2 x daily - 7 x weekly - 1-2 min hold - Standing Infraspinatus/Teres Minor Release with Ball at Wall  - 1-2 x daily - 7 x weekly - 1-2 min hold  Patient Education - Kinesiology tape   ASSESSMENT:  CLINICAL IMPRESSION: Charie reports she feels much better. Now able to reach up to a cabinet and bring something down w/o a pinching feeling.  Also able to wash and style her hair without limitation due to pain.  Overall decreased muscle tension/tightness noted although still experiences intermittent muscle spasms/active TPs (triggered in rhomboids, lower traps and subscapularis during MMT today).  Mechel is demonstrating good progress towards her PT goals and will benefit from continued skilled PT to address ongoing pain, abnormal muscle tension, ROM and strength deficits to improve mobility and activity tolerance with decreased pain interference, therefore will recommend recert with frequency reduced to 1x/wk for up to 4-6 weeks to prepare her for transition to HEP.   OBJECTIVE IMPAIRMENTS:  Abnormal gait, decreased activity tolerance, decreased balance, decreased mobility, difficulty walking, decreased ROM, decreased strength, increased fascial restrictions, impaired perceived functional ability, increased muscle spasms, impaired UE functional use, postural dysfunction, and pain.   ACTIVITY LIMITATIONS: carrying, lifting, standing, squatting, stairs, transfers, reach over head, locomotion level, and caring for others  PARTICIPATION LIMITATIONS: meal prep, cleaning, laundry, driving, shopping, community activity, and yard work  PERSONAL FACTORS: Age, Behavior  pattern, Education, Fitness, Past/current experiences, Social background, and Time since onset of injury/illness/exacerbation are also affecting patient's functional outcome.   REHAB POTENTIAL: Fair ongoing issues with pain since surgery January 2024, gross deconditioning, not compliant with HEP since DC from PT   CLINICAL DECISION MAKING: Stable/uncomplicated  EVALUATION COMPLEXITY: Low   GOALS: Goals reviewed with patient? Yes  SHORT TERM GOALS: Target date: 08/11/2023    Patient will be independent with initial HEP to improve outcomes and carryover.  Baseline:  Goal status: MET - 08/02/23  2.  Patient will report 25% improvement in B shoulder pain to improve QOL.   Baseline:  Goal status: MET - 08/16/23  3.  Will be able to dress/perform all self care activities without increased pain in neck and shoulders. Baseline:  Goal status: MET - 08/27/23 - no difficulty, no pain  LONG TERM GOALS: Target date: 09/08/2023; extended to 10/15/2023   Patient will be independent with ongoing/advanced HEP for self-management at home.  Baseline:  Goal status: IN PROGRESS - 09/03/23 - met for current HEP, anticipate further progression  2.  Patient will report 50-75% improvement in B shoulder pain to improve QOL.  Baseline:  Goal status: MET - 08/30/23  3.  Patient to demonstrate improved upright posture with posterior shoulder  girdle engaged to promote improved glenohumeral joint mobility. Baseline:  Goal status: MET - 09/03/23  4.  R shoulder AROM to be equal to L without pain provocation to allow for increased ease of ADLs.  Baseline:  Goal status: IN PROGRESS - 08/27/23 - overall R shoulder ROM improving, however L shoulder AROM still limited as compared to R with no change noted in abduction  5.  MMT will have improved by at least one grade in all weak groups  Baseline: Goal status: MET - 08/30/23  6  QuickDASH to have improved by at least 15 points to show improved QOL/subjective improvement  Baseline: 59.1% Goal status: MET - 09/03/23 - 20.5 / 100 = 20.5 %  7.  Patient will experience at least a 50% improvement in sciatica and low back discomfort    Baseline:  Goal status: IN PROGRESS - 08/30/23 - the pain comes and goes, slightly better  8. Will be able to perform exercise routine as desired without increase in pain in shoulders/back/LEs  Baseline:  Goal status: IN PROGRESS - 09/03/23    PLAN:  PT FREQUENCY: 1-2x/week  PT DURATION: 8 weeks  PLANNED INTERVENTIONS: [id number]- PT Re-evaluation, 97110-Therapeutic exercises, 97530- Therapeutic activity, 97112- Neuromuscular re-education, 97535- Self Care, [id number]- Manual therapy, [phone]- Aquatic Therapy, [id number]- Electrical stimulation (unattended), 97035- Ultrasound, [id number]- Ionotophoresis 4mg /ml Dexamethasone, Patient/Family education, Balance training, Taping, Dry Needling, Joint mobilization, Spinal mobilization, Cryotherapy, and Moist heat  PLAN FOR NEXT SESSION: Review/update HEP focusing on strengthening progression; shoulder strength/postural and core/lumbopelvic strength; reapplication of Kinesiotape as benefit noted to promote normalization of muscle activation and improved alignment, proximal and core strength and LE stretching as appropriate, general conditioning. Does not want dry needling.      Marry Guan, PT  09/03/23 1:28 PM

## 2023-09-06 ENCOUNTER — Encounter: Payer: Self-pay | Admitting: Physical Therapy

## 2023-09-06 ENCOUNTER — Ambulatory Visit: Payer: PPO | Admitting: Physical Therapy

## 2023-09-06 DIAGNOSIS — M25511 Pain in right shoulder: Secondary | ICD-10-CM | POA: Diagnosis not present

## 2023-09-06 DIAGNOSIS — G8929 Other chronic pain: Secondary | ICD-10-CM

## 2023-09-06 DIAGNOSIS — M6281 Muscle weakness (generalized): Secondary | ICD-10-CM

## 2023-09-06 DIAGNOSIS — R29898 Other symptoms and signs involving the musculoskeletal system: Secondary | ICD-10-CM

## 2023-09-06 DIAGNOSIS — M5459 Other low back pain: Secondary | ICD-10-CM

## 2023-09-06 NOTE — Therapy (Signed)
 OUTPATIENT PHYSICAL THERAPY TREATMENT     Patient Name: BELLARAE LIZER MRN: [id number] DOB:August 14, 1950, 73 y.o., female Today's Date: 09/06/2023   END OF SESSION:  PT End of Session - 09/06/23 0937     Visit Number 12    Date for PT Re-Evaluation 10/15/23    Authorization Type HTA    Progress Note Due on Visit 21   Recert/PN on visit #11 - 09/03/23   PT Start Time 0937   Pt arrived late   PT Stop Time 1010   Pt requesting to leave early for DDS appt   PT Time Calculation (min) 33 min    Activity Tolerance Patient tolerated treatment well    Behavior During Therapy [facility] for tasks assessed/performed                  Past Medical History:  Diagnosis Date   Allergy June 2012   Seasonal   Asthma 2015   Cancer [facility]) 2010   Left Breast   Coronary atherosclerosis due to calcified coronary lesion    GERD (gastroesophageal reflux disease)    Many years ago   History of breast cancer    Hyperlipidemia    Hypertension    Osteopenia    Past Surgical History:  Procedure Laterality Date   ABDOMINAL HYSTERECTOMY  2015   AUGMENTATION MAMMAPLASTY Left    BREAST BIOPSY Right 02/2019   BREAST IMPLANT EXCHANGE     COLONOSCOPY     COSMETIC SURGERY  2010   Breast cancer related   FOOT SURGERY Left 05/25/2020   MASTECTOMY Left    REDUCTION MAMMAPLASTY Right    ROTATOR CUFF REPAIR Right 07/17/2022   TONSILLECTOMY AND ADENOIDECTOMY  1958   Patient Active Problem List   Diagnosis Date Noted   MCI (mild cognitive impairment) 05/31/2023   Alzheimer's disease, unspecified (CODE) (HCC) 05/31/2023   Persistent disorder of initiating or maintaining sleep 04/13/2023   Short-term memory loss 03/01/2023   History of breast cancer 03/01/2023   Acute bursitis of right shoulder 04/21/2022   Other bursal cyst, left ankle and foot 04/21/2022   Coronary artery disease due to lipid rich plaque 09/30/2021   Hepatic steatosis 09/30/2021   Adenoma of left adrenal gland 09/30/2021   Strain  of left trapezius muscle 09/30/2021   Hyperlipidemia 05/19/2021   Somatic dysfunction of spine, thoracic 03/26/2021   Chronic left-sided thoracic back pain 12/30/2020   DCIS (ductal carcinoma in situ) 01/26/2020   Low back pain 01/26/2020   Chronic pain of both knees 09/19/2019   Osteopenia    Estrogen deficiency 04/14/2018   Essential hypertension 01/10/2018   Gastroesophageal reflux disease 01/10/2018   Mild intermittent asthma without complication 01/10/2018   Pain in left lower leg 03/03/2017   Family history of colon cancer 02/23/2017   Herpes simplex vulvovaginitis 02/18/2017   Neuropathy 02/18/2017   Elevated liver enzymes 02/17/2017   Need for Streptococcus pneumoniae vaccination 02/17/2017   Chondromalacia of left patella 12/28/2016   Synovitis of left knee 12/28/2016   Seasonal allergic rhinitis due to fungal spores 09/25/2016   Bone mass 09/24/2016   Chronic hip pain 09/24/2016   Dark yellow-colored urine 09/24/2016   Low oxygen saturation 09/24/2016   Other specified postprocedural states 01/17/2015   Abnormal stress electrocardiogram test 10/20/2013   Breast mass 12/11/2010   Family history of malignant neoplasm of breast 12/11/2010    PCP: Sharlene Dory, DO   REFERRING PROVIDER: Sharlene Dory, DO  REFERRING DIAG: [id number] (ICD-10-CM) -  Bilateral shoulder pain, unspecified chronicity   THERAPY DIAG:  Chronic right shoulder pain  Chronic left shoulder pain  Muscle weakness (generalized)  Other low back pain  Other symptoms and signs involving the musculoskeletal system  RATIONALE FOR EVALUATION AND TREATMENT: Rehabilitation  ONSET DATE: chronic- had initial rotator cuff surgery January 2024  NEXT MD VISIT: Referring PRN    SUBJECTIVE:                                                                                                                                                                                                          SUBJECTIVE STATEMENT: Pt notes some increased pain following testing last visit.  EVAL:  I'm having issues with both shoulders, R>L. Had R rotator cuff repair in the past (January 2024), that one hurts worse and now the left is hurting because I have started using my left arm more. Upper back is sore if I take a deep breath and if I shrug my shoulders. Feels like my right shoulder blade is "out". Muscle directly below my shoulder blade can go numb sometimes. Both arms ache at times all the way down to elbows and sometimes forearms. Feel very weak. Having some issues with my legs as well, I hurt everywhere in my body. When I try to reach across myself I get a bad pinch in my shoulder.   PAIN: Are you having pain? Yes: NPRS scale: 3/10  Pain location: R anterior shoulder, AC joint  Pain description: sore  Aggravating factors: uncertain  Relieving factors: not moving, ice, heat   PERTINENT HISTORY:  R RCR 07/20/22, osteopenia, L breast cancer, HTN, CAD, Alzheimer's dementia, LBP, sciatica, neuropathy, fatigue  PRECAUTIONS: None  RED FLAGS: None  HAND DOMINANCE: Right  WEIGHT BEARING RESTRICTIONS: No  FALLS:  Has patient fallen in last 6 months? No  LIVING ENVIRONMENT: Lives with: lives with their spouse Lives in: House/apartment  OCCUPATION: retired - used to work in Automotive engineer  PLOF: Independent, Independent with basic ADLs, Independent with gait, and Independent with transfers  PATIENT GOALS:  address pain, be able to do more, be able to exercise to lose weight/tolerate exercise better    OBJECTIVE: (objective measures completed at initial evaluation unless otherwise dated)  DIAGNOSTIC FINDINGS:  06/11/22 - R shoulder MRI: IMPRESSION: 1. Severe tendinosis of the supraspinatus tendon with a large full-thickness, near complete, tear with 3.8 cm of retraction and a few intact anterior most fibers. 2. Moderate tendinosis of the infraspinatus tendon with a tiny  interstitial tear. 3. Mild tendinosis of the intra-articular  portion of the long head of the biceps tendon. 04/21/22 - DG R shoulder: IMPRESSION: No fracture or dislocation of the right shoulder. Mild acromioclavicular and glenohumeral arthrosis.  PATIENT SURVEYS:  Neldon Mc 59.1%  09/03/23 - QuickDASH: 20.5 / 100 = 20.5 %  COGNITION: Overall cognitive status: History of cognitive impairments - at baseline mild cognitive impairment at baseline per chart    POSTURE: rounded shoulders, forward head, and increased thoracic kyphosis  UPPER EXTREMITY ROM:   Active ROM Right eval Left eval R 08/27/23 L 08/27/23  Shoulder flexion 145 160 153 163  Shoulder extension      Shoulder abduction 145 170 145 173  Shoulder adduction      Shoulder internal rotation L4  T7 T12 T6  Shoulder external rotation C4 C7 T2 T2  Elbow flexion      Elbow extension      Wrist flexion      Wrist extension      Wrist ulnar deviation      Wrist radial deviation      Wrist pronation      Wrist supination      (Blank rows = not tested)  UPPER/LOWER EXTREMITY MMT:  MMT Right eval Left eval R 08/30/23 L 08/30/23 R 09/03/23 L 09/03/23  Shoulder flexion 3+ 4 4 4+    Shoulder extension     4+ 4  Shoulder abduction 3 4 4- 4+    Shoulder adduction        Shoulder internal rotation 4 4 4+ 5    Shoulder external rotation 3 4 4 5     Middle trapezius     4+ 4+  Lower trapezius     4- 4  (Blank rows = not tested)  MMT Right eval Left eval R 09/03/23 L 09/03/23  Hip flexion 5 5 5 5   Hip extension   4+ 4+  Hip abduction   4+ 4+  Hip adduction   4+ 4+  Hip internal rotation   4 4  Hip external rotation   4 4  Knee flexion 4+ 4+ 5 5  Knee extension 5 5 5 5   Ankle dorsiflexion 5 5 5 5   Ankle plantarflexion      Ankle inversion      Ankle eversion       (Blank rows = not tested)   PALPATION:  Extremely tight in all paraspinals, upper traps, R anterior shoulder and biceps, suboccipitals     TODAY'S TREATMENT:    09/06/23 THERAPEUTIC EXERCISE: To improve ROM and flexibility.  Demonstration, verbal and tactile cues throughout for technique.  Pulleys - flexion x 3' - sitting with pool noodle along spine to promote scap retraction and good posture R shoulder flexion PROM with gentle stretch at end ROM  MANUAL THERAPY: To promote normalized muscle tension, improved flexibility, increased ROM, and reduced pain utilizing joint mobilization, connective tissue massage, therapeutic massage, manual TP therapy, and myofascial release.  STM/DTM and manual TPR to R pec major/minor and anterior deltoid R shoulder inferior and A/P glides  NEUROMUSCULAR RE-EDUCATION: To improve coordination, kinesthesia, posture, and proprioception. Lower trap setting at wall - "V" wall slide + slight lift-off x 10 Wall push-ups x 10 Serratus 3-way wall clocks with looped YTB at wrists x 10 bil   09/03/23 THERAPEUTIC EXERCISE: To improve strength and endurance.  Demonstration, verbal and tactile cues throughout for technique.  UBE - L2.0 x 6 min (3' fwd & 3' back)  THERAPEUTIC ACTIVITIES: To improve functional performance.  Demonstration, verbal and tactile cues throughout for technique. QuickDASH: 20.5 / 100 = 20.5 % MMT: scapular musculature & LE MMT  MANUAL THERAPY: To promote normalized muscle tension, increased ROM, and reduced pain utilizing connective tissue massage, therapeutic massage, and manual TP therapy. STM/DTM and manual TPR to R rhomboid, lower traps and subscapularis Reviewed options for self STM using ball on wall vs TheraCane   08/30/23 Therapeutic Activity: Assessment of goals for 10th visit note NEUROMUSCULAR RE-EDUCATION:  UBE - L2.0 x 6 min (3' fwd & 3' back) Rows 20lb 2x10 mid grip  Standing shld ext 15lb x 10- inc strain on collarbone Open books x 10 L side; 5x R -p! Afterwards MANUAL THERAPY: STM to R bicep attachment to coracoid, UT,LS Anterior shoulder stretch   PATIENT EDUCATION:   Education details: continue with current HEP  Person educated: Patient Education method: Explanation Education comprehension: verbalized understanding  HOME EXERCISE PROGRAM: Access Code: [email or web address] URL: [email or web address] Date: 08/12/2023 Prepared by: Glenetta Hew  Exercises - Seated Gentle Upper Trapezius Stretch  - 2 x daily - 7 x weekly - 3 reps - 30 sec hold - Gentle Levator Scapulae Stretch (Mirrored)  - 2 x daily - 7 x weekly - 3 reps - 30 sec hold - Standing Bilateral Low Shoulder Row with Anchored Resistance  - 1 x daily - 7 x weekly - 2 sets - 10 reps - 5 sec hold - Scapular Retraction with Resistance Advanced  - 1 x daily - 7 x weekly - 2 sets - 10 reps - 5 sec hold - Seated Thoracic Lumbar Extension  - 1 x daily - 7 x weekly - 2 sets - 10 reps - Seated Cervical Retraction  - 1 x daily - 7 x weekly - 2 sets - 10 reps - Bicep Stretch at Table  - 2 x daily - 7 x weekly - 2 sets - 10 reps - 3 sec hold - Doorway Pec Stretch at 60 Degrees Abduction with Arm Straight  - 2 x daily - 7 x weekly - 3 reps - 30 sec hold - Supine Thoracic Mobilization Towel Roll Vertical with Arm Stretch  - 1 x daily - 7 x weekly - 2 sets - 10 reps - 3 sec hold - Standing Pectoral Release with Ball at Wall  - 1-2 x daily - 7 x weekly - 1-2 min hold - Standing Infraspinatus/Teres Minor Release with Ball at [employer]  - 1-2 x daily - 7 x weekly - 1-2 min hold  Patient Education - Kinesiology tape   ASSESSMENT:  CLINICAL IMPRESSION: Sumaiyah reports some increased pain following the MMT and goal assessment last visit - settling down but still painful.  Increased muscle tension evident in R pecs and anterior deltoid which was addressed with MT with patient reporting decreased pain.  Initiated strengthening progression targeting scapular muscle engagement/stabilization with cues for proper alignment and technique but deferred HEP modification as pt arrived late and had to leave early.  Consepcion will  benefit from continued skilled PT to address ongoing deficits to improve mobility and activity tolerance with decreased pain interference.   OBJECTIVE IMPAIRMENTS: Abnormal gait, decreased activity tolerance, decreased balance, decreased mobility, difficulty walking, decreased ROM, decreased strength, increased fascial restrictions, impaired perceived functional ability, increased muscle spasms, impaired UE functional use, postural dysfunction, and pain.   ACTIVITY LIMITATIONS: carrying, lifting, standing, squatting, stairs, transfers, reach over head, locomotion level, and caring for others  PARTICIPATION LIMITATIONS: meal prep, cleaning, laundry, driving, shopping, community activity,  and yard work  PERSONAL FACTORS: Age, Behavior pattern, Education, Fitness, Past/current experiences, Social background, and Time since onset of injury/illness/exacerbation are also affecting patient's functional outcome.   REHAB POTENTIAL: Fair ongoing issues with pain since surgery January 2024, gross deconditioning, not compliant with HEP since DC from PT   CLINICAL DECISION MAKING: Stable/uncomplicated  EVALUATION COMPLEXITY: Low   GOALS: Goals reviewed with patient? Yes  SHORT TERM GOALS: Target date: 08/11/2023    Patient will be independent with initial HEP to improve outcomes and carryover.  Baseline:  Goal status: MET - 08/02/23  2.  Patient will report 25% improvement in B shoulder pain to improve QOL.   Baseline:  Goal status: MET - 08/16/23  3.  Will be able to dress/perform all self care activities without increased pain in neck and shoulders. Baseline:  Goal status: MET - 08/27/23 - no difficulty, no pain  LONG TERM GOALS: Target date: 09/08/2023; extended to 10/15/2023   Patient will be independent with ongoing/advanced HEP for self-management at home.  Baseline:  Goal status: IN PROGRESS - 09/03/23 - met for current HEP, anticipate further progression  2.  Patient will report 50-75%  improvement in B shoulder pain to improve QOL.  Baseline:  Goal status: MET - 08/30/23  3.  Patient to demonstrate improved upright posture with posterior shoulder girdle engaged to promote improved glenohumeral joint mobility. Baseline:  Goal status: MET - 09/03/23  4.  R shoulder AROM to be equal to L without pain provocation to allow for increased ease of ADLs.  Baseline:  Goal status: IN PROGRESS - 08/27/23 - overall R shoulder ROM improving, however L shoulder AROM still limited as compared to R with no change noted in abduction  5.  MMT will have improved by at least one grade in all weak groups  Baseline: Goal status: MET - 08/30/23  6  QuickDASH to have improved by at least 15 points to show improved QOL/subjective improvement  Baseline: 59.1% Goal status: MET - 09/03/23 - 20.5 / 100 = 20.5 %  7.  Patient will experience at least a 50% improvement in sciatica and low back discomfort    Baseline:  Goal status: IN PROGRESS - 08/30/23 - the pain comes and goes, slightly better  8. Will be able to perform exercise routine as desired without increase in pain in shoulders/back/LEs  Baseline:  Goal status: IN PROGRESS - 09/03/23    PLAN:  PT FREQUENCY: 1-2x/week  PT DURATION: 8 weeks  PLANNED INTERVENTIONS: 44010- PT Re-evaluation, 97110-Therapeutic exercises, 97530- Therapeutic activity, 97112- Neuromuscular re-education, 97535- Self Care, 27253- Manual therapy, [phone]- Aquatic Therapy, [id number]- Electrical stimulation (unattended), 97035- Ultrasound, [id number]- Ionotophoresis 4mg /ml Dexamethasone, Patient/Family education, Balance training, Taping, Dry Needling, Joint mobilization, Spinal mobilization, Cryotherapy, and Moist heat  PLAN FOR NEXT SESSION: Review/update HEP focusing on strengthening progression; shoulder strength/postural and core/lumbopelvic strength; reapplication of Kinesiotape as benefit noted to promote normalization of muscle activation and improved alignment, proximal and  core strength and LE stretching as appropriate, general conditioning. Does not want dry needling.      Marry Guan, PT  09/06/23 10:18 AM

## 2023-09-07 ENCOUNTER — Ambulatory Visit: Payer: PPO | Admitting: Adult Health

## 2023-09-15 ENCOUNTER — Ambulatory Visit

## 2023-09-15 DIAGNOSIS — M6281 Muscle weakness (generalized): Secondary | ICD-10-CM

## 2023-09-15 DIAGNOSIS — M5459 Other low back pain: Secondary | ICD-10-CM

## 2023-09-15 DIAGNOSIS — R29898 Other symptoms and signs involving the musculoskeletal system: Secondary | ICD-10-CM

## 2023-09-15 DIAGNOSIS — M25511 Pain in right shoulder: Secondary | ICD-10-CM | POA: Diagnosis not present

## 2023-09-15 DIAGNOSIS — G8929 Other chronic pain: Secondary | ICD-10-CM

## 2023-09-15 NOTE — Therapy (Signed)
 OUTPATIENT PHYSICAL THERAPY TREATMENT     Patient Name: JERRELL HART MRN: [id number] DOB:1950-07-20, 73 y.o., female Today's Date: 09/15/2023   END OF SESSION:  PT End of Session - 09/15/23 1001     Visit Number 13    Date for PT Re-Evaluation 10/15/23    Authorization Type HTA    Progress Note Due on Visit 21   Recert/PN on visit #11 - 09/03/23   PT Start Time 0936   pt late   PT Stop Time 1015    PT Time Calculation (min) 39 min    Activity Tolerance Patient tolerated treatment well    Behavior During Therapy [facility] for tasks assessed/performed                   Past Medical History:  Diagnosis Date   Allergy June 2012   Seasonal   Asthma 2015   Cancer [facility]) 2010   Left Breast   Coronary atherosclerosis due to calcified coronary lesion    GERD (gastroesophageal reflux disease)    Many years ago   History of breast cancer    Hyperlipidemia    Hypertension    Osteopenia    Past Surgical History:  Procedure Laterality Date   ABDOMINAL HYSTERECTOMY  2015   AUGMENTATION MAMMAPLASTY Left    BREAST BIOPSY Right 02/2019   BREAST IMPLANT EXCHANGE     COLONOSCOPY     COSMETIC SURGERY  2010   Breast cancer related   FOOT SURGERY Left 05/25/2020   MASTECTOMY Left    REDUCTION MAMMAPLASTY Right    ROTATOR CUFF REPAIR Right 07/17/2022   TONSILLECTOMY AND ADENOIDECTOMY  1958   Patient Active Problem List   Diagnosis Date Noted   MCI (mild cognitive impairment) 05/31/2023   Alzheimer's disease, unspecified (CODE) (HCC) 05/31/2023   Persistent disorder of initiating or maintaining sleep 04/13/2023   Short-term memory loss 03/01/2023   History of breast cancer 03/01/2023   Acute bursitis of right shoulder 04/21/2022   Other bursal cyst, left ankle and foot 04/21/2022   Coronary artery disease due to lipid rich plaque 09/30/2021   Hepatic steatosis 09/30/2021   Adenoma of left adrenal gland 09/30/2021   Strain of left trapezius muscle 09/30/2021    Hyperlipidemia 05/19/2021   Somatic dysfunction of spine, thoracic 03/26/2021   Chronic left-sided thoracic back pain 12/30/2020   DCIS (ductal carcinoma in situ) 01/26/2020   Low back pain 01/26/2020   Chronic pain of both knees 09/19/2019   Osteopenia    Estrogen deficiency 04/14/2018   Essential hypertension 01/10/2018   Gastroesophageal reflux disease 01/10/2018   Mild intermittent asthma without complication 01/10/2018   Pain in left lower leg 03/03/2017   Family history of colon cancer 02/23/2017   Herpes simplex vulvovaginitis 02/18/2017   Neuropathy 02/18/2017   Elevated liver enzymes 02/17/2017   Need for Streptococcus pneumoniae vaccination 02/17/2017   Chondromalacia of left patella 12/28/2016   Synovitis of left knee 12/28/2016   Seasonal allergic rhinitis due to fungal spores 09/25/2016   Bone mass 09/24/2016   Chronic hip pain 09/24/2016   Dark yellow-colored urine 09/24/2016   Low oxygen saturation 09/24/2016   Other specified postprocedural states 01/17/2015   Abnormal stress electrocardiogram test 10/20/2013   Breast mass 12/11/2010   Family history of malignant neoplasm of breast 12/11/2010    PCP: Sharlene Dory, DO   REFERRING PROVIDER: Sharlene Dory, DO  REFERRING DIAG: [id number] (ICD-10-CM) - Bilateral shoulder pain, unspecified chronicity   THERAPY  DIAG:  Chronic right shoulder pain  Chronic left shoulder pain  Muscle weakness (generalized)  Other low back pain  Other symptoms and signs involving the musculoskeletal system  RATIONALE FOR EVALUATION AND TREATMENT: Rehabilitation  ONSET DATE: chronic- had initial rotator cuff surgery January 2024  NEXT MD VISIT: Referring PRN    SUBJECTIVE:                                                                                                                                                                                                         SUBJECTIVE STATEMENT: Pt  reports soreness along R scapula and some L hip pain.  EVAL:  I'm having issues with both shoulders, R>L. Had R rotator cuff repair in the past (January 2024), that one hurts worse and now the left is hurting because I have started using my left arm more. Upper back is sore if I take a deep breath and if I shrug my shoulders. Feels like my right shoulder blade is "out". Muscle directly below my shoulder blade can go numb sometimes. Both arms ache at times all the way down to elbows and sometimes forearms. Feel very weak. Having some issues with my legs as well, I hurt everywhere in my body. When I try to reach across myself I get a bad pinch in my shoulder.   PAIN: Are you having pain? Yes: NPRS scale: 3/10  Pain location: R anterior shoulder, AC joint  Pain description: sore  Aggravating factors: uncertain  Relieving factors: not moving, ice, heat   PERTINENT HISTORY:  R RCR 07/20/22, osteopenia, L breast cancer, HTN, CAD, Alzheimer's dementia, LBP, sciatica, neuropathy, fatigue  PRECAUTIONS: None  RED FLAGS: None  HAND DOMINANCE: Right  WEIGHT BEARING RESTRICTIONS: No  FALLS:  Has patient fallen in last 6 months? No  LIVING ENVIRONMENT: Lives with: lives with their spouse Lives in: House/apartment  OCCUPATION: retired - used to work in Automotive engineer  PLOF: Independent, Independent with basic ADLs, Independent with gait, and Independent with transfers  PATIENT GOALS:  address pain, be able to do more, be able to exercise to lose weight/tolerate exercise better    OBJECTIVE: (objective measures completed at initial evaluation unless otherwise dated)  DIAGNOSTIC FINDINGS:  06/11/22 - R shoulder MRI: IMPRESSION: 1. Severe tendinosis of the supraspinatus tendon with a large full-thickness, near complete, tear with 3.8 cm of retraction and a few intact anterior most fibers. 2. Moderate tendinosis of the infraspinatus tendon with a tiny interstitial tear. 3. Mild  tendinosis of the intra-articular portion of the long head of  the biceps tendon. 04/21/22 - DG R shoulder: IMPRESSION: No fracture or dislocation of the right shoulder. Mild acromioclavicular and glenohumeral arthrosis.  PATIENT SURVEYS:  Neldon Mc 59.1%  09/03/23 - QuickDASH: 20.5 / 100 = 20.5 %  COGNITION: Overall cognitive status: History of cognitive impairments - at baseline mild cognitive impairment at baseline per chart    POSTURE: rounded shoulders, forward head, and increased thoracic kyphosis  UPPER EXTREMITY ROM:   Active ROM Right eval Left eval R 08/27/23 L 08/27/23  Shoulder flexion 145 160 153 163  Shoulder extension      Shoulder abduction 145 170 145 173  Shoulder adduction      Shoulder internal rotation L4  T7 T12 T6  Shoulder external rotation C4 C7 T2 T2  Elbow flexion      Elbow extension      Wrist flexion      Wrist extension      Wrist ulnar deviation      Wrist radial deviation      Wrist pronation      Wrist supination      (Blank rows = not tested)  UPPER/LOWER EXTREMITY MMT:  MMT Right eval Left eval R 08/30/23 L 08/30/23 R 09/03/23 L 09/03/23  Shoulder flexion 3+ 4 4 4+    Shoulder extension     4+ 4  Shoulder abduction 3 4 4- 4+    Shoulder adduction        Shoulder internal rotation 4 4 4+ 5    Shoulder external rotation 3 4 4 5     Middle trapezius     4+ 4+  Lower trapezius     4- 4  (Blank rows = not tested)  MMT Right eval Left eval R 09/03/23 L 09/03/23  Hip flexion 5 5 5 5   Hip extension   4+ 4+  Hip abduction   4+ 4+  Hip adduction   4+ 4+  Hip internal rotation   4 4  Hip external rotation   4 4  Knee flexion 4+ 4+ 5 5  Knee extension 5 5 5 5   Ankle dorsiflexion 5 5 5 5   Ankle plantarflexion      Ankle inversion      Ankle eversion       (Blank rows = not tested)   PALPATION:  Extremely tight in all paraspinals, upper traps, R anterior shoulder and biceps, suboccipitals     TODAY'S TREATMENT:  09/15/23 THERAPEUTIC  EXERCISE: To improve ROM and flexibility.  Demonstration, verbal and tactile cues throughout for technique.  UBE L2.0 3 min fwd and 3 min back  Shoulder cross body stretch 2x30" Standing back to doorframe horiz ADD to ABD x 10 Wall angels x 10 to 90 deg Open books side to wall x 10 bil Seated rows 20lb 2x10 Standing shoulder ext 10lb x 15  MANUAL THERAPY: To promote normalized muscle tension, improved flexibility STM to R rhomboids, persicap muscles  09/06/23 THERAPEUTIC EXERCISE: To improve ROM and flexibility.  Demonstration, verbal and tactile cues throughout for technique.  Pulleys - flexion x 3' - sitting with pool noodle along spine to promote scap retraction and good posture R shoulder flexion PROM with gentle stretch at end ROM  MANUAL THERAPY: To promote normalized muscle tension, improved flexibility, increased ROM, and reduced pain utilizing joint mobilization, connective tissue massage, therapeutic massage, manual TP therapy, and myofascial release.  STM/DTM and manual TPR to R pec major/minor and anterior deltoid R shoulder inferior and A/P glides  NEUROMUSCULAR RE-EDUCATION: To improve coordination, kinesthesia, posture, and proprioception. Lower trap setting at wall - "V" wall slide + slight lift-off x 10 Wall push-ups x 10 Serratus 3-way wall clocks with looped YTB at wrists x 10 bil   09/03/23 THERAPEUTIC EXERCISE: To improve strength and endurance.  Demonstration, verbal and tactile cues throughout for technique.  UBE - L2.0 x 6 min (3' fwd & 3' back)  THERAPEUTIC ACTIVITIES: To improve functional performance.  Demonstration, verbal and tactile cues throughout for technique. QuickDASH: 20.5 / 100 = 20.5 % MMT: scapular musculature & LE MMT  MANUAL THERAPY: To promote normalized muscle tension, increased ROM, and reduced pain utilizing connective tissue massage, therapeutic massage, and manual TP therapy. STM/DTM and manual TPR to R rhomboid, lower traps and  subscapularis Reviewed options for self STM using ball on wall vs TheraCane   08/30/23 Therapeutic Activity: Assessment of goals for 10th visit note NEUROMUSCULAR RE-EDUCATION:  UBE - L2.0 x 6 min (3' fwd & 3' back) Rows 20lb 2x10 mid grip  Standing shld ext 15lb x 10- inc strain on collarbone Open books x 10 L side; 5x R -p! Afterwards MANUAL THERAPY: STM to R bicep attachment to coracoid, UT,LS Anterior shoulder stretch   PATIENT EDUCATION:  Education details: continue with current HEP  Person educated: Patient Education method: Explanation Education comprehension: verbalized understanding  HOME EXERCISE PROGRAM: Access Code: [email or web address] URL: [email or web address] Date: 09/15/2023 Prepared by: Verta Ellen  Exercises - Seated Gentle Upper Trapezius Stretch  - 2 x daily - 7 x weekly - 3 reps - 30 sec hold - Gentle Levator Scapulae Stretch (Mirrored)  - 2 x daily - 7 x weekly - 3 reps - 30 sec hold - Standing Bilateral Low Shoulder Row with Anchored Resistance  - 1 x daily - 7 x weekly - 2 sets - 10 reps - 5 sec hold - Scapular Retraction with Resistance Advanced  - 1 x daily - 7 x weekly - 2 sets - 10 reps - 5 sec hold - Seated Thoracic Lumbar Extension  - 1 x daily - 7 x weekly - 2 sets - 10 reps - Seated Cervical Retraction  - 1 x daily - 7 x weekly - 2 sets - 10 reps - Bicep Stretch at Table  - 2 x daily - 7 x weekly - 2 sets - 10 reps - 3 sec hold - Doorway Pec Stretch at 60 Degrees Abduction with Arm Straight  - 2 x daily - 7 x weekly - 3 reps - 30 sec hold - Supine Thoracic Mobilization Towel Roll Vertical with Arm Stretch  - 1 x daily - 7 x weekly - 2 sets - 10 reps - 3 sec hold - Standing Pectoral Release with Ball at Wall  - 1-2 x daily - 7 x weekly - 1-2 min hold - Standing Infraspinatus/Teres Minor Release with Ball at [employer]  - 1-2 x daily - 7 x weekly - 1-2 min hold - Modified Posterior Capsule Stretch  - 2 x daily - 7 x weekly - 2 sets - 30 sec hold -  Standing Thoracic Open Book at Wall  - 2 x daily - 7 x weekly - 2 sets - 10 reps  Patient Education - Kinesiology tape   ASSESSMENT:  CLINICAL IMPRESSION: Pt notes continued soreness and tightness along her R scapula. MT and TE focusing on scapular mobility to stretch her rhomboid area. Followed with gentle periscapular strengthening. She noted relief from the treatment today, will plan  to continue progressing her strength next visit. [place] will benefit from continued skilled PT to address ongoing deficits to improve mobility and activity tolerance with decreased pain interference.   OBJECTIVE IMPAIRMENTS: Abnormal gait, decreased activity tolerance, decreased balance, decreased mobility, difficulty walking, decreased ROM, decreased strength, increased fascial restrictions, impaired perceived functional ability, increased muscle spasms, impaired UE functional use, postural dysfunction, and pain.   ACTIVITY LIMITATIONS: carrying, lifting, standing, squatting, stairs, transfers, reach over head, locomotion level, and caring for others  PARTICIPATION LIMITATIONS: meal prep, cleaning, laundry, driving, shopping, community activity, and yard work  PERSONAL FACTORS: Age, Behavior pattern, Education, Fitness, Past/current experiences, Social background, and Time since onset of injury/illness/exacerbation are also affecting patient's functional outcome.   REHAB POTENTIAL: Fair ongoing issues with pain since surgery January 2024, gross deconditioning, not compliant with HEP since DC from PT   CLINICAL DECISION MAKING: Stable/uncomplicated  EVALUATION COMPLEXITY: Low   GOALS: Goals reviewed with patient? Yes  SHORT TERM GOALS: Target date: 08/11/2023    Patient will be independent with initial HEP to improve outcomes and carryover.  Baseline:  Goal status: MET - 08/02/23  2.  Patient will report 25% improvement in B shoulder pain to improve QOL.   Baseline:  Goal status: MET - 08/16/23  3.   Will be able to dress/perform all self care activities without increased pain in neck and shoulders. Baseline:  Goal status: MET - 08/27/23 - no difficulty, no pain  LONG TERM GOALS: Target date: 09/08/2023; extended to 10/15/2023   Patient will be independent with ongoing/advanced HEP for self-management at home.  Baseline:  Goal status: IN PROGRESS - 09/03/23 - met for current HEP, anticipate further progression  2.  Patient will report 50-75% improvement in B shoulder pain to improve QOL.  Baseline:  Goal status: MET - 08/30/23  3.  Patient to demonstrate improved upright posture with posterior shoulder girdle engaged to promote improved glenohumeral joint mobility. Baseline:  Goal status: MET - 09/03/23  4.  R shoulder AROM to be equal to L without pain provocation to allow for increased ease of ADLs.  Baseline:  Goal status: IN PROGRESS - 08/27/23 - overall R shoulder ROM improving, however L shoulder AROM still limited as compared to R with no change noted in abduction  5.  MMT will have improved by at least one grade in all weak groups  Baseline: Goal status: MET - 08/30/23  6  QuickDASH to have improved by at least 15 points to show improved QOL/subjective improvement  Baseline: 59.1% Goal status: MET - 09/03/23 - 20.5 / 100 = 20.5 %  7.  Patient will experience at least a 50% improvement in sciatica and low back discomfort    Baseline:  Goal status: IN PROGRESS - 08/30/23 - the pain comes and goes, slightly better  8. Will be able to perform exercise routine as desired without increase in pain in shoulders/back/LEs  Baseline:  Goal status: IN PROGRESS - 09/03/23    PLAN:  PT FREQUENCY: 1-2x/week  PT DURATION: 8 weeks  PLANNED INTERVENTIONS: 97164- PT Re-evaluation, 97110-Therapeutic exercises, 97530- Therapeutic activity, 97112- Neuromuscular re-education, 97535- Self Care, 82956- Manual therapy, [phone]- Aquatic Therapy, [id number]- Electrical stimulation (unattended), 97035-  Ultrasound, [id number]- Ionotophoresis 4mg /ml Dexamethasone, Patient/Family education, Balance training, Taping, Dry Needling, Joint mobilization, Spinal mobilization, Cryotherapy, and Moist heat  PLAN FOR NEXT SESSION: Review/update HEP focusing on strengthening progression; shoulder strength/postural and core/lumbopelvic strength; reapplication of Kinesiotape as benefit noted to promote normalization of muscle activation and improved  alignment, proximal and core strength and LE stretching as appropriate, general conditioning. Does not want dry needling.      Darleene Cleaver, PTA  09/15/23 10:17 AM

## 2023-09-22 ENCOUNTER — Ambulatory Visit: Attending: Family Medicine

## 2023-09-22 DIAGNOSIS — R29898 Other symptoms and signs involving the musculoskeletal system: Secondary | ICD-10-CM

## 2023-09-22 DIAGNOSIS — M6281 Muscle weakness (generalized): Secondary | ICD-10-CM | POA: Diagnosis not present

## 2023-09-22 DIAGNOSIS — M542 Cervicalgia: Secondary | ICD-10-CM | POA: Insufficient documentation

## 2023-09-22 DIAGNOSIS — M5459 Other low back pain: Secondary | ICD-10-CM | POA: Diagnosis not present

## 2023-09-22 DIAGNOSIS — M25511 Pain in right shoulder: Secondary | ICD-10-CM | POA: Diagnosis not present

## 2023-09-22 DIAGNOSIS — M25512 Pain in left shoulder: Secondary | ICD-10-CM | POA: Diagnosis not present

## 2023-09-22 DIAGNOSIS — G8929 Other chronic pain: Secondary | ICD-10-CM | POA: Diagnosis not present

## 2023-09-22 DIAGNOSIS — M546 Pain in thoracic spine: Secondary | ICD-10-CM | POA: Insufficient documentation

## 2023-09-22 NOTE — Therapy (Signed)
 OUTPATIENT PHYSICAL THERAPY TREATMENT     Patient Name: Tonya [name] MRN: [id number] DOB:23-Dec-1950, 73 y.o., female Today's Date: 09/22/2023   END OF SESSION:  PT End of Session - 09/22/23 1448     Visit Number 14    Date for PT Re-Evaluation 10/15/23    Authorization Type HTA    Progress Note Due on Visit 21   Recert/PN on visit #11 - 09/03/23   PT Start Time 1401    PT Stop Time 1452    PT Time Calculation (min) 51 min    Activity Tolerance Patient tolerated treatment well    Behavior During Therapy [facility] for tasks assessed/performed                    Past Medical History:  Diagnosis Date   Allergy June 2012   Seasonal   Asthma 2015   Cancer [facility]) 2010   Left Breast   Coronary atherosclerosis due to calcified coronary lesion    GERD (gastroesophageal reflux disease)    Many years ago   History of breast cancer    Hyperlipidemia    Hypertension    Osteopenia    Past Surgical History:  Procedure Laterality Date   ABDOMINAL HYSTERECTOMY  2015   AUGMENTATION MAMMAPLASTY Left    BREAST BIOPSY Right 02/2019   BREAST IMPLANT EXCHANGE     COLONOSCOPY     COSMETIC SURGERY  2010   Breast cancer related   FOOT SURGERY Left 05/25/2020   MASTECTOMY Left    REDUCTION MAMMAPLASTY Right    ROTATOR CUFF REPAIR Right 07/17/2022   TONSILLECTOMY AND ADENOIDECTOMY  1958   Patient Active Problem List   Diagnosis Date Noted   MCI (mild cognitive impairment) 05/31/2023   Alzheimer's disease, unspecified (CODE) (HCC) 05/31/2023   Persistent disorder of initiating or maintaining sleep 04/13/2023   Short-term memory loss 03/01/2023   History of breast cancer 03/01/2023   Acute bursitis of right shoulder 04/21/2022   Other bursal cyst, left ankle and foot 04/21/2022   Coronary artery disease due to lipid rich plaque 09/30/2021   Hepatic steatosis 09/30/2021   Adenoma of left adrenal gland 09/30/2021   Strain of left trapezius muscle 09/30/2021   Hyperlipidemia  05/19/2021   Somatic dysfunction of spine, thoracic 03/26/2021   Chronic left-sided thoracic back pain 12/30/2020   DCIS (ductal carcinoma in situ) 01/26/2020   Low back pain 01/26/2020   Chronic pain of both knees 09/19/2019   Osteopenia    Estrogen deficiency 04/14/2018   Essential hypertension 01/10/2018   Gastroesophageal reflux disease 01/10/2018   Mild intermittent asthma without complication 01/10/2018   Pain in left lower leg 03/03/2017   Family history of colon cancer 02/23/2017   Herpes simplex vulvovaginitis 02/18/2017   Neuropathy 02/18/2017   Elevated liver enzymes 02/17/2017   Need for Streptococcus pneumoniae vaccination 02/17/2017   Chondromalacia of left patella 12/28/2016   Synovitis of left knee 12/28/2016   Seasonal allergic rhinitis due to fungal spores 09/25/2016   Bone mass 09/24/2016   Chronic hip pain 09/24/2016   Dark yellow-colored urine 09/24/2016   Low oxygen saturation 09/24/2016   Other specified postprocedural states 01/17/2015   Abnormal stress electrocardiogram test 10/20/2013   Breast mass 12/11/2010   Family history of malignant neoplasm of breast 12/11/2010    PCP: Sharlene Dory, DO   REFERRING PROVIDER: Sharlene Dory, DO  REFERRING DIAG: [phone] (ICD-10-CM) - Bilateral shoulder pain, unspecified chronicity   THERAPY DIAG:  Chronic right shoulder pain  Chronic left shoulder pain  Muscle weakness (generalized)  Other low back pain  Other symptoms and signs involving the musculoskeletal system  RATIONALE FOR EVALUATION AND TREATMENT: Rehabilitation  ONSET DATE: chronic- had initial rotator cuff surgery January 2024  NEXT MD VISIT: Referring PRN    SUBJECTIVE:                                                                                                                                                                                                         SUBJECTIVE STATEMENT: Pt reports soreness  along R scapula, she believes this is from sowing that she has been doing lately.  EVAL:  I'm having issues with both shoulders, R>L. Had R rotator cuff repair in the past (January 2024), that one hurts worse and now the left is hurting because I have started using my left arm more. Upper back is sore if I take a deep breath and if I shrug my shoulders. Feels like my right shoulder blade is "out". Muscle directly below my shoulder blade can go numb sometimes. Both arms ache at times all the way down to elbows and sometimes forearms. Feel very weak. Having some issues with my legs as well, I hurt everywhere in my body. When I try to reach across myself I get a bad pinch in my shoulder.   PAIN: Are you having pain? Yes: NPRS scale: 8/10  Pain location: R persicapular area  Pain description: sore  Aggravating factors: uncertain  Relieving factors: not moving, ice, heat   PERTINENT HISTORY:  R RCR 07/20/22, osteopenia, L breast cancer, HTN, CAD, Alzheimer's dementia, LBP, sciatica, neuropathy, fatigue  PRECAUTIONS: None  RED FLAGS: None  HAND DOMINANCE: Right  WEIGHT BEARING RESTRICTIONS: No  FALLS:  Has patient fallen in last 6 months? No  LIVING ENVIRONMENT: Lives with: lives with their spouse Lives in: House/apartment  OCCUPATION: retired - used to work in Automotive engineer  PLOF: Independent, Independent with basic ADLs, Independent with gait, and Independent with transfers  PATIENT GOALS:  address pain, be able to do more, be able to exercise to lose weight/tolerate exercise better    OBJECTIVE: (objective measures completed at initial evaluation unless otherwise dated)  DIAGNOSTIC FINDINGS:  06/11/22 - R shoulder MRI: IMPRESSION: 1. Severe tendinosis of the supraspinatus tendon with a large full-thickness, near complete, tear with 3.8 cm of retraction and a few intact anterior most fibers. 2. Moderate tendinosis of the infraspinatus tendon with a tiny interstitial  tear. 3. Mild tendinosis of the intra-articular portion of the  long head of the biceps tendon. 04/21/22 - DG R shoulder: IMPRESSION: No fracture or dislocation of the right shoulder. Mild acromioclavicular and glenohumeral arthrosis.  PATIENT SURVEYS:  Neldon Mc 59.1%  09/03/23 - QuickDASH: 20.5 / 100 = 20.5 %  COGNITION: Overall cognitive status: History of cognitive impairments - at baseline mild cognitive impairment at baseline per chart    POSTURE: rounded shoulders, forward head, and increased thoracic kyphosis  UPPER EXTREMITY ROM:   Active ROM Right eval Left eval R 08/27/23 L 08/27/23  Shoulder flexion 145 160 153 163  Shoulder extension      Shoulder abduction 145 170 145 173  Shoulder adduction      Shoulder internal rotation L4  T7 T12 T6  Shoulder external rotation C4 C7 T2 T2  Elbow flexion      Elbow extension      Wrist flexion      Wrist extension      Wrist ulnar deviation      Wrist radial deviation      Wrist pronation      Wrist supination      (Blank rows = not tested)  UPPER/LOWER EXTREMITY MMT:  MMT Right eval Left eval R 08/30/23 L 08/30/23 R 09/03/23 L 09/03/23  Shoulder flexion 3+ 4 4 4+    Shoulder extension     4+ 4  Shoulder abduction 3 4 4- 4+    Shoulder adduction        Shoulder internal rotation 4 4 4+ 5    Shoulder external rotation 3 4 4 5     Middle trapezius     4+ 4+  Lower trapezius     4- 4  (Blank rows = not tested)  MMT Right eval Left eval R 09/03/23 L 09/03/23  Hip flexion 5 5 5 5   Hip extension   4+ 4+  Hip abduction   4+ 4+  Hip adduction   4+ 4+  Hip internal rotation   4 4  Hip external rotation   4 4  Knee flexion 4+ 4+ 5 5  Knee extension 5 5 5 5   Ankle dorsiflexion 5 5 5 5   Ankle plantarflexion      Ankle inversion      Ankle eversion       (Blank rows = not tested)   PALPATION:  Extremely tight in all paraspinals, upper traps, R anterior shoulder and biceps, suboccipitals     TODAY'S TREATMENT:   09/22/23 NEUROMUSCULAR RE-EDUCATION: To improve proprioception, balance, and kinesthesia. UBE L3.0 3 min fwd and 3 min back Wall angles x 10 Standing back to doorframe horiz ADD to ABD x 10 Cabinet reaches 3lb x 10 bil Modified post capsule stretch 2x30" Ladder walkups second to highest bar x 10 Wall push ups with ladder 2 x 10   Manual Therapy: to decrease muscle spasm, pain and improve mobility.  STM to rhomboids, persicap muscles Moist heat to R shoulder and scapula x 10 min  09/15/23 THERAPEUTIC EXERCISE: To improve ROM and flexibility.  Demonstration, verbal and tactile cues throughout for technique.  UBE L2.0 3 min fwd and 3 min back  Shoulder cross body stretch 2x30" Standing back to doorframe horiz ADD to ABD x 10 Wall angels x 10 to 90 deg Open books side to wall x 10 bil Seated rows 20lb 2x10 Standing shoulder ext 10lb x 15  MANUAL THERAPY: To promote normalized muscle tension, improved flexibility STM to R rhomboids, persicap muscles  09/06/23 THERAPEUTIC EXERCISE:  To improve ROM and flexibility.  Demonstration, verbal and tactile cues throughout for technique.  Pulleys - flexion x 3' - sitting with pool noodle along spine to promote scap retraction and good posture R shoulder flexion PROM with gentle stretch at end ROM  MANUAL THERAPY: To promote normalized muscle tension, improved flexibility, increased ROM, and reduced pain utilizing joint mobilization, connective tissue massage, therapeutic massage, manual TP therapy, and myofascial release.  STM/DTM and manual TPR to R pec major/minor and anterior deltoid R shoulder inferior and A/P glides  NEUROMUSCULAR RE-EDUCATION: To improve coordination, kinesthesia, posture, and proprioception. Lower trap setting at wall - "V" wall slide + slight lift-off x 10 Wall push-ups x 10 Serratus 3-way wall clocks with looped YTB at wrists x 10 bil   09/03/23 THERAPEUTIC EXERCISE: To improve strength and endurance.  Demonstration,  verbal and tactile cues throughout for technique.  UBE - L2.0 x 6 min (3' fwd & 3' back)  THERAPEUTIC ACTIVITIES: To improve functional performance.  Demonstration, verbal and tactile cues throughout for technique. QuickDASH: 20.5 / 100 = 20.5 % MMT: scapular musculature & LE MMT  MANUAL THERAPY: To promote normalized muscle tension, increased ROM, and reduced pain utilizing connective tissue massage, therapeutic massage, and manual TP therapy. STM/DTM and manual TPR to R rhomboid, lower traps and subscapularis Reviewed options for self STM using ball on wall vs TheraCane   08/30/23 Therapeutic Activity: Assessment of goals for 10th visit note NEUROMUSCULAR RE-EDUCATION:  UBE - L2.0 x 6 min (3' fwd & 3' back) Rows 20lb 2x10 mid grip  Standing shld ext 15lb x 10- inc strain on collarbone Open books x 10 L side; 5x R -p! Afterwards MANUAL THERAPY: STM to R bicep attachment to coracoid, UT,LS Anterior shoulder stretch   PATIENT EDUCATION:  Education details: continue with current HEP  Person educated: Patient Education method: Explanation Education comprehension: verbalized understanding  HOME EXERCISE PROGRAM: Access Code: [email or web address] URL: [email or web address] Date: 09/15/2023 Prepared by: Verta Ellen  Exercises - Seated Gentle Upper Trapezius Stretch  - 2 x daily - 7 x weekly - 3 reps - 30 sec hold - Gentle Levator Scapulae Stretch (Mirrored)  - 2 x daily - 7 x weekly - 3 reps - 30 sec hold - Standing Bilateral Low Shoulder Row with Anchored Resistance  - 1 x daily - 7 x weekly - 2 sets - 10 reps - 5 sec hold - Scapular Retraction with Resistance Advanced  - 1 x daily - 7 x weekly - 2 sets - 10 reps - 5 sec hold - Seated Thoracic Lumbar Extension  - 1 x daily - 7 x weekly - 2 sets - 10 reps - Seated Cervical Retraction  - 1 x daily - 7 x weekly - 2 sets - 10 reps - Bicep Stretch at Table  - 2 x daily - 7 x weekly - 2 sets - 10 reps - 3 sec hold - Doorway Pec  Stretch at 60 Degrees Abduction with Arm Straight  - 2 x daily - 7 x weekly - 3 reps - 30 sec hold - Supine Thoracic Mobilization Towel Roll Vertical with Arm Stretch  - 1 x daily - 7 x weekly - 2 sets - 10 reps - 3 sec hold - Standing Pectoral Release with Ball at Wall  - 1-2 x daily - 7 x weekly - 1-2 min hold - Standing Infraspinatus/Teres Minor Release with Ball at Wall  - 1-2 x daily - 7 x weekly - 1-2 min  hold - Modified Posterior Capsule Stretch  - 2 x daily - 7 x weekly - 2 sets - 30 sec hold - Standing Thoracic Open Book at Wall  - 2 x daily - 7 x weekly - 2 sets - 10 reps  Patient Education - Kinesiology tape   ASSESSMENT:  CLINICAL IMPRESSION: Pt continues to note increased tension along her R scapular area. Efforts today went toward decreasing muscle tension but also improving functional performance of her R shoulder. The muscle tension decreased after manual but after exercises it tightened up again so we did heat on her shoulders for tightness. Anastacia will benefit from continued skilled PT to address ongoing deficits to improve mobility and activity tolerance with decreased pain interference.   OBJECTIVE IMPAIRMENTS: Abnormal gait, decreased activity tolerance, decreased balance, decreased mobility, difficulty walking, decreased ROM, decreased strength, increased fascial restrictions, impaired perceived functional ability, increased muscle spasms, impaired UE functional use, postural dysfunction, and pain.   ACTIVITY LIMITATIONS: carrying, lifting, standing, squatting, stairs, transfers, reach over head, locomotion level, and caring for others  PARTICIPATION LIMITATIONS: meal prep, cleaning, laundry, driving, shopping, community activity, and yard work  PERSONAL FACTORS: Age, Behavior pattern, Education, Fitness, Past/current experiences, Social background, and Time since onset of injury/illness/exacerbation are also affecting patient's functional outcome.   REHAB POTENTIAL: Fair  ongoing issues with pain since surgery January 2024, gross deconditioning, not compliant with HEP since DC from PT   CLINICAL DECISION MAKING: Stable/uncomplicated  EVALUATION COMPLEXITY: Low   GOALS: Goals reviewed with patient? Yes  SHORT TERM GOALS: Target date: 08/11/2023    Patient will be independent with initial HEP to improve outcomes and carryover.  Baseline:  Goal status: MET - 08/02/23  2.  Patient will report 25% improvement in B shoulder pain to improve QOL.   Baseline:  Goal status: MET - 08/16/23  3.  Will be able to dress/perform all self care activities without increased pain in neck and shoulders. Baseline:  Goal status: MET - 08/27/23 - no difficulty, no pain  LONG TERM GOALS: Target date: 09/08/2023; extended to 10/15/2023   Patient will be independent with ongoing/advanced HEP for self-management at home.  Baseline:  Goal status: IN PROGRESS - 09/03/23 - met for current HEP, anticipate further progression  2.  Patient will report 50-75% improvement in B shoulder pain to improve QOL.  Baseline:  Goal status: MET - 08/30/23  3.  Patient to demonstrate improved upright posture with posterior shoulder girdle engaged to promote improved glenohumeral joint mobility. Baseline:  Goal status: MET - 09/03/23  4.  R shoulder AROM to be equal to L without pain provocation to allow for increased ease of ADLs.  Baseline:  Goal status: IN PROGRESS - 08/27/23 - overall R shoulder ROM improving, however L shoulder AROM still limited as compared to R with no change noted in abduction  5.  MMT will have improved by at least one grade in all weak groups  Baseline: Goal status: MET - 08/30/23  6  QuickDASH to have improved by at least 15 points to show improved QOL/subjective improvement  Baseline: 59.1% Goal status: MET - 09/03/23 - 20.5 / 100 = 20.5 %  7.  Patient will experience at least a 50% improvement in sciatica and low back discomfort    Baseline:  Goal status: IN  PROGRESS - 08/30/23 - the pain comes and goes, slightly better  8. Will be able to perform exercise routine as desired without increase in pain in shoulders/back/LEs  Baseline:  Goal status: IN PROGRESS - 09/03/23    PLAN:  PT FREQUENCY: 1-2x/week  PT DURATION: 8 weeks  PLANNED INTERVENTIONS: 40981- PT Re-evaluation, 97110-Therapeutic exercises, 97530- Therapeutic activity, 97112- Neuromuscular re-education, 97535- Self Care, [id number]- Manual therapy, [phone]- Aquatic Therapy, [id number]- Electrical stimulation (unattended), 97035- Ultrasound, [id number]- Ionotophoresis 4mg /ml Dexamethasone, Patient/Family education, Balance training, Taping, Dry Needling, Joint mobilization, Spinal mobilization, Cryotherapy, and Moist heat  PLAN FOR NEXT SESSION: Review/update HEP focusing on strengthening progression; shoulder strength/postural and core/lumbopelvic strength; reapplication of Kinesiotape as benefit noted to promote normalization of muscle activation and improved alignment, proximal and core strength and LE stretching as appropriate, general conditioning. Does not want dry needling.      Darleene Cleaver, PTA  09/22/23 3:41 PM

## 2023-09-29 ENCOUNTER — Ambulatory Visit

## 2023-09-29 DIAGNOSIS — M25511 Pain in right shoulder: Secondary | ICD-10-CM

## 2023-09-29 DIAGNOSIS — G8929 Other chronic pain: Secondary | ICD-10-CM

## 2023-09-29 DIAGNOSIS — M6281 Muscle weakness (generalized): Secondary | ICD-10-CM

## 2023-09-29 DIAGNOSIS — M25512 Pain in left shoulder: Secondary | ICD-10-CM

## 2023-09-29 NOTE — Therapy (Signed)
 OUTPATIENT PHYSICAL THERAPY TREATMENT     Patient Name: Tonya [name] MRN: [id number] DOB:08/31/1950, 73 y.o., female Today's Date: 09/29/2023   END OF SESSION:           Past Medical History:  Diagnosis Date   Allergy June 2012   Seasonal   Asthma 2015   [facility]) 2010   Left Breast   Coronary atherosclerosis due to calcified coronary lesion    GERD (gastroesophageal reflux disease)    Many years ago   History of breast cancer    Hyperlipidemia    Hypertension    Osteopenia    Past Surgical History:  Procedure Laterality Date   ABDOMINAL HYSTERECTOMY  2015   AUGMENTATION MAMMAPLASTY Left    BREAST BIOPSY Right 02/2019   BREAST IMPLANT EXCHANGE     COLONOSCOPY     COSMETIC SURGERY  2010   Breast cancer related   FOOT SURGERY Left 05/25/2020   MASTECTOMY Left    REDUCTION MAMMAPLASTY Right    ROTATOR CUFF REPAIR Right 07/17/2022   TONSILLECTOMY AND ADENOIDECTOMY  1958   Patient Active Problem List   Diagnosis Date Noted   MCI (mild cognitive impairment) 05/31/2023   Alzheimer's disease, unspecified (CODE) (HCC) 05/31/2023   Persistent disorder of initiating or maintaining sleep 04/13/2023   Short-term memory loss 03/01/2023   History of breast cancer 03/01/2023   Acute bursitis of right shoulder 04/21/2022   Other bursal cyst, left ankle and foot 04/21/2022   Coronary artery disease due to lipid rich plaque 09/30/2021   Hepatic steatosis 09/30/2021   Adenoma of left adrenal gland 09/30/2021   Strain of left trapezius muscle 09/30/2021   Hyperlipidemia 05/19/2021   Somatic dysfunction of spine, thoracic 03/26/2021   Chronic left-sided thoracic back pain 12/30/2020   DCIS (ductal carcinoma in situ) 01/26/2020   Low back pain 01/26/2020   Chronic pain of both knees 09/19/2019   Osteopenia    Estrogen deficiency 04/14/2018   Essential hypertension 01/10/2018   Gastroesophageal reflux disease 01/10/2018   Mild intermittent asthma without  complication 01/10/2018   Pain in left lower leg 03/03/2017   Family history of colon cancer 02/23/2017   Herpes simplex vulvovaginitis 02/18/2017   Neuropathy 02/18/2017   Elevated liver enzymes 02/17/2017   Need for Streptococcus pneumoniae vaccination 02/17/2017   Chondromalacia of left patella 12/28/2016   Synovitis of left knee 12/28/2016   Seasonal allergic rhinitis due to fungal spores 09/25/2016   Bone mass 09/24/2016   Chronic hip pain 09/24/2016   Dark yellow-colored urine 09/24/2016   Low oxygen saturation 09/24/2016   Other specified postprocedural states 01/17/2015   Abnormal stress electrocardiogram test 10/20/2013   Breast mass 12/11/2010   Family history of malignant neoplasm of breast 12/11/2010    PCP: Sharlene Dory, DO   REFERRING PROVIDER: Sharlene Dory, DO  REFERRING DIAG: [phone] (ICD-10-CM) - Bilateral shoulder pain, unspecified chronicity   THERAPY DIAG:  Chronic right shoulder pain  Chronic left shoulder pain  Muscle weakness (generalized)  RATIONALE FOR EVALUATION AND TREATMENT: Rehabilitation  ONSET DATE: chronic- had initial rotator cuff surgery January 2024  NEXT MD VISIT: Referring PRN    SUBJECTIVE:  SUBJECTIVE STATEMENT: Doing good as for as pain  EVAL:  I'm having issues with both shoulders, R>L. Had R rotator cuff repair in the past (January 2024), that one hurts worse and now the left is hurting because I have started using my left arm more. Upper back is sore if I take a deep breath and if I shrug my shoulders. Feels like my right shoulder blade is "out". Muscle directly below my shoulder blade can go numb sometimes. Both arms ache at times all the way down to elbows and sometimes forearms. Feel very weak. Having  some issues with my legs as well, I hurt everywhere in my body. When I try to reach across myself I get a bad pinch in my shoulder.   PAIN: Are you having pain? Yes: NPRS scale: 0/10  Pain location: R persicapular area  Pain description: sore  Aggravating factors: uncertain  Relieving factors: not moving, ice, heat   PERTINENT HISTORY:  R RCR 07/20/22, osteopenia, L breast cancer, HTN, CAD, Alzheimer's dementia, LBP, sciatica, neuropathy, fatigue  PRECAUTIONS: None  RED FLAGS: None  HAND DOMINANCE: Right  WEIGHT BEARING RESTRICTIONS: No  FALLS:  Has patient fallen in last 6 months? No  LIVING ENVIRONMENT: Lives with: lives with their spouse Lives in: House/apartment  OCCUPATION: retired - used to work in Automotive engineer  PLOF: Independent, Independent with basic ADLs, Independent with gait, and Independent with transfers  PATIENT GOALS:  address pain, be able to do more, be able to exercise to lose weight/tolerate exercise better    OBJECTIVE: (objective measures completed at initial evaluation unless otherwise dated)  DIAGNOSTIC FINDINGS:  06/11/22 - R shoulder MRI: IMPRESSION: 1. Severe tendinosis of the supraspinatus tendon with a large full-thickness, near complete, tear with 3.8 cm of retraction and a few intact anterior most fibers. 2. Moderate tendinosis of the infraspinatus tendon with a tiny interstitial tear. 3. Mild tendinosis of the intra-articular portion of the long head of the biceps tendon. 04/21/22 - DG R shoulder: IMPRESSION: No fracture or dislocation of the right shoulder. Mild acromioclavicular and glenohumeral arthrosis.  PATIENT SURVEYS:  Neldon Mc 59.1%  09/03/23 - QuickDASH: 20.5 / 100 = 20.5 %  COGNITION: Overall cognitive status: History of cognitive impairments - at baseline mild cognitive impairment at baseline per chart    POSTURE: rounded shoulders, forward head, and increased thoracic kyphosis  UPPER EXTREMITY ROM:    Active ROM Right eval Left eval R 08/27/23 L 08/27/23  Shoulder flexion 145 160 153 163  Shoulder extension      Shoulder abduction 145 170 145 173  Shoulder adduction      Shoulder internal rotation L4  T7 T12 T6  Shoulder external rotation C4 C7 T2 T2  Elbow flexion      Elbow extension      Wrist flexion      Wrist extension      Wrist ulnar deviation      Wrist radial deviation      Wrist pronation      Wrist supination      (Blank rows = not tested)  UPPER/LOWER EXTREMITY MMT:  MMT Right eval Left eval R 08/30/23 L 08/30/23 R 09/03/23 L 09/03/23  Shoulder flexion 3+ 4 4 4+    Shoulder extension     4+ 4  Shoulder abduction 3 4 4- 4+    Shoulder adduction        Shoulder internal rotation 4 4 4+ 5    Shoulder external rotation 3  4 4 5     Middle trapezius     4+ 4+  Lower trapezius     4- 4  (Blank rows = not tested)  MMT Right eval Left eval R 09/03/23 L 09/03/23  Hip flexion 5 5 5 5   Hip extension   4+ 4+  Hip abduction   4+ 4+  Hip adduction   4+ 4+  Hip internal rotation   4 4  Hip external rotation   4 4  Knee flexion 4+ 4+ 5 5  Knee extension 5 5 5 5   Ankle dorsiflexion 5 5 5 5   Ankle plantarflexion      Ankle inversion      Ankle eversion       (Blank rows = not tested)   PALPATION:  Extremely tight in all paraspinals, upper traps, R anterior shoulder and biceps, suboccipitals     TODAY'S TREATMENT:  09/29/23 Nustep L5x37min Seated bent over:   Shoulder extension x 10   Shoulder rows 4lb- limited by neck pain Standing IR stretch with strap x 10 Ladder walk ups x 10 Wall pushups on ladder 2x10 Standing shoulder flexion 1lb x 10 Standing shoulder abduction 1lb x 10  09/22/23 NEUROMUSCULAR RE-EDUCATION: To improve proprioception, balance, and kinesthesia. UBE L3.0 3 min fwd and 3 min back Wall angles x 10 Standing back to doorframe horiz ADD to ABD x 10 Cabinet reaches 3lb x 10 bil Modified post capsule stretch 2x30" Ladder walkups second to highest  bar x 10 Wall push ups with ladder 2 x 10   Manual Therapy: to decrease muscle spasm, pain and improve mobility.  STM to rhomboids, persicap muscles Moist heat to R shoulder and scapula x 10 min  09/15/23 THERAPEUTIC EXERCISE: To improve ROM and flexibility.  Demonstration, verbal and tactile cues throughout for technique.  UBE L2.0 3 min fwd and 3 min back  Shoulder cross body stretch 2x30" Standing back to doorframe horiz ADD to ABD x 10 Wall angels x 10 to 90 deg Open books side to wall x 10 bil Seated rows 20lb 2x10 Standing shoulder ext 10lb x 15  MANUAL THERAPY: To promote normalized muscle tension, improved flexibility STM to R rhomboids, persicap muscles  09/06/23 THERAPEUTIC EXERCISE: To improve ROM and flexibility.  Demonstration, verbal and tactile cues throughout for technique.  Pulleys - flexion x 3' - sitting with pool noodle along spine to promote scap retraction and good posture R shoulder flexion PROM with gentle stretch at end ROM  MANUAL THERAPY: To promote normalized muscle tension, improved flexibility, increased ROM, and reduced pain utilizing joint mobilization, connective tissue massage, therapeutic massage, manual TP therapy, and myofascial release.  STM/DTM and manual TPR to R pec major/minor and anterior deltoid R shoulder inferior and A/P glides  NEUROMUSCULAR RE-EDUCATION: To improve coordination, kinesthesia, posture, and proprioception. Lower trap setting at wall - "V" wall slide + slight lift-off x 10 Wall push-ups x 10 Serratus 3-way wall clocks with looped YTB at wrists x 10 bil   09/03/23 THERAPEUTIC EXERCISE: To improve strength and endurance.  Demonstration, verbal and tactile cues throughout for technique.  UBE - L2.0 x 6 min (3' fwd & 3' back)  THERAPEUTIC ACTIVITIES: To improve functional performance.  Demonstration, verbal and tactile cues throughout for technique. QuickDASH: 20.5 / 100 = 20.5 % MMT: scapular musculature & LE  MMT  MANUAL THERAPY: To promote normalized muscle tension, increased ROM, and reduced pain utilizing connective tissue massage, therapeutic massage, and manual TP therapy. STM/DTM  and manual TPR to R rhomboid, lower traps and subscapularis Reviewed options for self STM using ball on wall vs TheraCane   08/30/23 Therapeutic Activity: Assessment of goals for 10th visit note NEUROMUSCULAR RE-EDUCATION:  UBE - L2.0 x 6 min (3' fwd & 3' back) Rows 20lb 2x10 mid grip  Standing shld ext 15lb x 10- inc strain on collarbone Open books x 10 L side; 5x R -p! Afterwards MANUAL THERAPY: STM to R bicep attachment to coracoid, UT,LS Anterior shoulder stretch   PATIENT EDUCATION:  Education details: continue with current HEP  Person educated: Patient Education method: Explanation Education comprehension: verbalized understanding  HOME EXERCISE PROGRAM: Access Code: [email or web address] URL: [email or web address] Date: 09/15/2023 Prepared by: Verta Ellen  Exercises - Seated Gentle Upper Trapezius Stretch  - 2 x daily - 7 x weekly - 3 reps - 30 sec hold - Gentle Levator Scapulae Stretch (Mirrored)  - 2 x daily - 7 x weekly - 3 reps - 30 sec hold - Standing Bilateral Low Shoulder Row with Anchored Resistance  - 1 x daily - 7 x weekly - 2 sets - 10 reps - 5 sec hold - Scapular Retraction with Resistance Advanced  - 1 x daily - 7 x weekly - 2 sets - 10 reps - 5 sec hold - Seated Thoracic Lumbar Extension  - 1 x daily - 7 x weekly - 2 sets - 10 reps - Seated Cervical Retraction  - 1 x daily - 7 x weekly - 2 sets - 10 reps - Bicep Stretch at Table  - 2 x daily - 7 x weekly - 2 sets - 10 reps - 3 sec hold - Doorway Pec Stretch at 60 Degrees Abduction with Arm Straight  - 2 x daily - 7 x weekly - 3 reps - 30 sec hold - Supine Thoracic Mobilization Towel Roll Vertical with Arm Stretch  - 1 x daily - 7 x weekly - 2 sets - 10 reps - 3 sec hold - Standing Pectoral Release with Ball at Wall  - 1-2 x  daily - 7 x weekly - 1-2 min hold - Standing Infraspinatus/Teres Minor Release with Ball at [employer]  - 1-2 x daily - 7 x weekly - 1-2 min hold - Modified Posterior Capsule Stretch  - 2 x daily - 7 x weekly - 2 sets - 30 sec hold - Standing Thoracic Open Book at Wall  - 2 x daily - 7 x weekly - 2 sets - 10 reps  Patient Education - Kinesiology tape   ASSESSMENT:  CLINICAL IMPRESSION: Pt tolerated the exercises well. No subjective reports of pain so we continued progressing strengthening exercises. Emphasziwed postural alignment and overhead strengthening to improve motor control and function. Added shoulder flexion and abduction to HEP. Narcissa will benefit from continued skilled PT to address ongoing deficits to improve mobility and activity tolerance with decreased pain interference.   OBJECTIVE IMPAIRMENTS: Abnormal gait, decreased activity tolerance, decreased balance, decreased mobility, difficulty walking, decreased ROM, decreased strength, increased fascial restrictions, impaired perceived functional ability, increased muscle spasms, impaired UE functional use, postural dysfunction, and pain.   ACTIVITY LIMITATIONS: carrying, lifting, standing, squatting, stairs, transfers, reach over head, locomotion level, and caring for others  PARTICIPATION LIMITATIONS: meal prep, cleaning, laundry, driving, shopping, community activity, and yard work  PERSONAL FACTORS: Age, Behavior pattern, Education, Fitness, Past/current experiences, Social background, and Time since onset of injury/illness/exacerbation are also affecting patient's functional outcome.   REHAB POTENTIAL: Fair ongoing issues with pain since  surgery January 2024, gross deconditioning, not compliant with HEP since DC from PT   CLINICAL DECISION MAKING: Stable/uncomplicated  EVALUATION COMPLEXITY: Low   GOALS: Goals reviewed with patient? Yes  SHORT TERM GOALS: Target date: 08/11/2023    Patient will be independent with initial  HEP to improve outcomes and carryover.  Baseline:  Goal status: MET - 08/02/23  2.  Patient will report 25% improvement in B shoulder pain to improve QOL.   Baseline:  Goal status: MET - 08/16/23  3.  Will be able to dress/perform all self care activities without increased pain in neck and shoulders. Baseline:  Goal status: MET - 08/27/23 - no difficulty, no pain  LONG TERM GOALS: Target date: 09/08/2023; extended to 10/15/2023   Patient will be independent with ongoing/advanced HEP for self-management at home.  Baseline:  Goal status: IN PROGRESS - 09/29/23 - met for current HEP, good with today's progressions  2.  Patient will report 50-75% improvement in B shoulder pain to improve QOL.  Baseline:  Goal status: MET - 08/30/23  3.  Patient to demonstrate improved upright posture with posterior shoulder girdle engaged to promote improved glenohumeral joint mobility. Baseline:  Goal status: MET - 09/03/23  4.  R shoulder AROM to be equal to L without pain provocation to allow for increased ease of ADLs.  Baseline:  Goal status: IN PROGRESS - 08/27/23 - overall R shoulder ROM improving, however L shoulder AROM still limited as compared to R with no change noted in abduction  5.  MMT will have improved by at least one grade in all weak groups  Baseline: Goal status: MET - 08/30/23  6  QuickDASH to have improved by at least 15 points to show improved QOL/subjective improvement  Baseline: 59.1% Goal status: MET - 09/03/23 - 20.5 / 100 = 20.5 %  7.  Patient will experience at least a 50% improvement in sciatica and low back discomfort    Baseline:  Goal status: IN PROGRESS - 08/30/23 - the pain comes and goes, slightly better  8. Will be able to perform exercise routine as desired without increase in pain in shoulders/back/LEs  Baseline:  Goal status: IN PROGRESS - 09/03/23    PLAN:  PT FREQUENCY: 1-2x/week  PT DURATION: 8 weeks  PLANNED INTERVENTIONS: 16109- PT Re-evaluation,  97110-Therapeutic exercises, 97530- Therapeutic activity, 97112- Neuromuscular re-education, 97535- Self Care, [id number]- Manual therapy, [phone]- Aquatic Therapy, [id number]- Electrical stimulation (unattended), 97035- Ultrasound, [id number]- Ionotophoresis 4mg /ml Dexamethasone, Patient/Family education, Balance training, Taping, Dry Needling, Joint mobilization, Spinal mobilization, Cryotherapy, and Moist heat  PLAN FOR NEXT SESSION: Review/update HEP focusing on strengthening progression; shoulder strength/postural and core/lumbopelvic strength; reapplication of Kinesiotape as benefit noted to promote normalization of muscle activation and improved alignment, proximal and core strength and LE stretching as appropriate, general conditioning. Does not want dry needling.      Darleene Cleaver, PTA  09/29/23 2:02 PM

## 2023-10-06 ENCOUNTER — Encounter: Admitting: Physical Therapy

## 2023-10-07 ENCOUNTER — Other Ambulatory Visit: Payer: Self-pay

## 2023-10-07 ENCOUNTER — Ambulatory Visit

## 2023-10-07 DIAGNOSIS — M6281 Muscle weakness (generalized): Secondary | ICD-10-CM

## 2023-10-07 DIAGNOSIS — M542 Cervicalgia: Secondary | ICD-10-CM

## 2023-10-07 DIAGNOSIS — G8929 Other chronic pain: Secondary | ICD-10-CM

## 2023-10-07 DIAGNOSIS — M25511 Pain in right shoulder: Secondary | ICD-10-CM | POA: Diagnosis not present

## 2023-10-07 DIAGNOSIS — M546 Pain in thoracic spine: Secondary | ICD-10-CM

## 2023-10-07 NOTE — Therapy (Signed)
 OUTPATIENT PHYSICAL THERAPY TREATMENT     Patient Name: TISHIE ALTMANN MRN: [id number] DOB:1951-05-19, 73 y.o., female Today's Date: 10/07/2023   END OF SESSION:  PT End of Session - 10/07/23 1614     Visit Number 16    Date for PT Re-Evaluation 10/15/23    Authorization Type HTA    Authorization Time Period 07/14/23 to 09/08/23    Progress Note Due on Visit 21    PT Start Time 1535    PT Stop Time 1615    PT Time Calculation (min) 40 min    Activity Tolerance Patient tolerated treatment well    Behavior During Therapy [facility] for tasks assessed/performed                     Past Medical History:  Diagnosis Date   Allergy June 2012   Seasonal   Asthma 2015   Cancer [facility]) 2010   Left Breast   Coronary atherosclerosis due to calcified coronary lesion    GERD (gastroesophageal reflux disease)    Many years ago   History of breast cancer    Hyperlipidemia    Hypertension    Osteopenia    Past Surgical History:  Procedure Laterality Date   ABDOMINAL HYSTERECTOMY  2015   AUGMENTATION MAMMAPLASTY Left    BREAST BIOPSY Right 02/2019   BREAST IMPLANT EXCHANGE     COLONOSCOPY     COSMETIC SURGERY  2010   Breast cancer related   FOOT SURGERY Left 05/25/2020   MASTECTOMY Left    REDUCTION MAMMAPLASTY Right    ROTATOR CUFF REPAIR Right 07/17/2022   TONSILLECTOMY AND ADENOIDECTOMY  1958   Patient Active Problem List   Diagnosis Date Noted   MCI (mild cognitive impairment) 05/31/2023   Alzheimer's disease, unspecified (CODE) (HCC) 05/31/2023   Persistent disorder of initiating or maintaining sleep 04/13/2023   Short-term memory loss 03/01/2023   History of breast cancer 03/01/2023   Acute bursitis of right shoulder 04/21/2022   Other bursal cyst, left ankle and foot 04/21/2022   Coronary artery disease due to lipid rich plaque 09/30/2021   Hepatic steatosis 09/30/2021   Adenoma of left adrenal gland 09/30/2021   Strain of left trapezius muscle 09/30/2021    Hyperlipidemia 05/19/2021   Somatic dysfunction of spine, thoracic 03/26/2021   Chronic left-sided thoracic back pain 12/30/2020   DCIS (ductal carcinoma in situ) 01/26/2020   Low back pain 01/26/2020   Chronic pain of both knees 09/19/2019   Osteopenia    Estrogen deficiency 04/14/2018   Essential hypertension 01/10/2018   Gastroesophageal reflux disease 01/10/2018   Mild intermittent asthma without complication 01/10/2018   Pain in left lower leg 03/03/2017   Family history of colon cancer 02/23/2017   Herpes simplex vulvovaginitis 02/18/2017   Neuropathy 02/18/2017   Elevated liver enzymes 02/17/2017   Need for Streptococcus pneumoniae vaccination 02/17/2017   Chondromalacia of left patella 12/28/2016   Synovitis of left knee 12/28/2016   Seasonal allergic rhinitis due to fungal spores 09/25/2016   Bone mass 09/24/2016   Chronic hip pain 09/24/2016   Dark yellow-colored urine 09/24/2016   Low oxygen saturation 09/24/2016   Other specified postprocedural states 01/17/2015   Abnormal stress electrocardiogram test 10/20/2013   Breast mass 12/11/2010   Family history of malignant neoplasm of breast 12/11/2010    PCP: Sharlene Dory, DO   REFERRING PROVIDER: Sharlene Dory, DO  REFERRING DIAG: [phone] (ICD-10-CM) - Bilateral shoulder pain, unspecified chronicity  THERAPY DIAG:  Chronic right shoulder pain  Chronic left shoulder pain  Muscle weakness (generalized)  Acute pain of right shoulder  Pain in thoracic spine  Cervicalgia  RATIONALE FOR EVALUATION AND TREATMENT: Rehabilitation  ONSET DATE: chronic- had initial rotator cuff surgery January 2024  NEXT MD VISIT: Referring PRN    SUBJECTIVE:                                                                                                                                                                                                         SUBJECTIVE STATEMENT: Doing well I think I  am ready to hold PT ,  if I have pain I have exercises that I can utilize to address it that works.  I really feel like you all have helped me a lot.  EVAL:  I'm having issues with both shoulders, R>L. Had R rotator cuff repair in the past (January 2024), that one hurts worse and now the left is hurting because I have started using my left arm more. Upper back is sore if I take a deep breath and if I shrug my shoulders. Feels like my right shoulder blade is "out". Muscle directly below my shoulder blade can go numb sometimes. Both arms ache at times all the way down to elbows and sometimes forearms. Feel very weak. Having some issues with my legs as well, I hurt everywhere in my body. When I try to reach across myself I get a bad pinch in my shoulder.   PAIN: Are you having pain? Yes: NPRS scale: 0/10  Pain location: R persicapular area  Pain description: sore  Aggravating factors: uncertain  Relieving factors: not moving, ice, heat   PERTINENT HISTORY:  R RCR 07/20/22, osteopenia, L breast cancer, HTN, CAD, Alzheimer's dementia, LBP, sciatica, neuropathy, fatigue  PRECAUTIONS: None  RED FLAGS: None  HAND DOMINANCE: Right  WEIGHT BEARING RESTRICTIONS: No  FALLS:  Has patient fallen in last 6 months? No  LIVING ENVIRONMENT: Lives with: lives with their spouse Lives in: House/apartment  OCCUPATION: retired - used to work in Automotive engineer  PLOF: Independent, Independent with basic ADLs, Independent with gait, and Independent with transfers  PATIENT GOALS:  address pain, be able to do more, be able to exercise to lose weight/tolerate exercise better    OBJECTIVE: (objective measures completed at initial evaluation unless otherwise dated)  DIAGNOSTIC FINDINGS:  06/11/22 - R shoulder MRI: IMPRESSION: 1. Severe tendinosis of the supraspinatus tendon with a large full-thickness, near complete, tear with 3.8 cm of retraction and a few intact anterior  most fibers. 2. Moderate  tendinosis of the infraspinatus tendon with a tiny interstitial tear. 3. Mild tendinosis of the intra-articular portion of the long head of the biceps tendon. 04/21/22 - DG R shoulder: IMPRESSION: No fracture or dislocation of the right shoulder. Mild acromioclavicular and glenohumeral arthrosis.  PATIENT SURVEYS:  Neldon Mc 59.1%  09/03/23 - QuickDASH: 20.5 / 100 = 20.5 %  COGNITION: Overall cognitive status: History of cognitive impairments - at baseline mild cognitive impairment at baseline per chart    POSTURE: rounded shoulders, forward head, and increased thoracic kyphosis  UPPER EXTREMITY ROM:   Active ROM Right eval Left eval R 08/27/23 L 08/27/23  Shoulder flexion 145 160 153 163  Shoulder extension      Shoulder abduction 145 170 145 173  Shoulder adduction      Shoulder internal rotation L4  T7 T12 T6  Shoulder external rotation C4 C7 T2 T2  Elbow flexion      Elbow extension      Wrist flexion      Wrist extension      Wrist ulnar deviation      Wrist radial deviation      Wrist pronation      Wrist supination      (Blank rows = not tested)  UPPER/LOWER EXTREMITY MMT:  MMT Right eval Left eval R 08/30/23 L 08/30/23 R 09/03/23 L 09/03/23 10/07/23 10/07/23  Shoulder flexion 3+ 4 4 4+   4+ 4+  Shoulder extension     4+ 4    Shoulder abduction 3 4 4- 4+   4+ 4+  Shoulder adduction          Shoulder internal rotation 4 4 4+ 5   5 5   Shoulder external rotation 3 4 4 5    4+ 5  Middle trapezius     4+ 4+    Lower trapezius     4- 4    (Blank rows = not tested)  MMT Right eval Left eval R 09/03/23 L 09/03/23  Hip flexion 5 5 5 5   Hip extension   4+ 4+  Hip abduction   4+ 4+  Hip adduction   4+ 4+  Hip internal rotation   4 4  Hip external rotation   4 4  Knee flexion 4+ 4+ 5 5  Knee extension 5 5 5 5   Ankle dorsiflexion 5 5 5 5   Ankle plantarflexion      Ankle inversion      Ankle eversion       (Blank rows = not tested)   PALPATION:  Extremely tight in all  paraspinals, upper traps, R anterior shoulder and biceps, suboccipitals     TODAY'S TREATMENT:  10/07/23:  Reassessed B shoulder strength and AROM B shoulders Pt with reports of thoracic spine tightness, upper traps region Manual:  Supine for manual stretching for B upper traps,  B levator scapulae Cervical extensors Also contract relax for the above Gentle stretching R shoulder in all planes,  Manual resisted R shoulder serratus punches 10 x   09/29/23 Nustep L5x85min Seated bent over:   Shoulder extension x 10   Shoulder rows 4lb- limited by neck pain Standing IR stretch with strap x 10 Ladder walk ups x 10 Wall pushups on ladder 2x10 Standing shoulder flexion 1lb x 10 Standing shoulder abduction 1lb x 10  09/22/23 NEUROMUSCULAR RE-EDUCATION: To improve proprioception, balance, and kinesthesia. UBE L3.0 3 min fwd and 3 min back Wall angles x 10 Standing  back to doorframe horiz ADD to ABD x 10 Cabinet reaches 3lb x 10 bil Modified post capsule stretch 2x30" Ladder walkups second to highest bar x 10 Wall push ups with ladder 2 x 10   Manual Therapy: to decrease muscle spasm, pain and improve mobility.  STM to rhomboids, persicap muscles Moist heat to R shoulder and scapula x 10 min  09/15/23 THERAPEUTIC EXERCISE: To improve ROM and flexibility.  Demonstration, verbal and tactile cues throughout for technique.  UBE L2.0 3 min fwd and 3 min back  Shoulder cross body stretch 2x30" Standing back to doorframe horiz ADD to ABD x 10 Wall angels x 10 to 90 deg Open books side to wall x 10 bil Seated rows 20lb 2x10 Standing shoulder ext 10lb x 15  MANUAL THERAPY: To promote normalized muscle tension, improved flexibility STM to R rhomboids, persicap muscles  09/06/23 THERAPEUTIC EXERCISE: To improve ROM and flexibility.  Demonstration, verbal and tactile cues throughout for technique.  Pulleys - flexion x 3' - sitting with pool noodle along spine to promote scap retraction and  good posture R shoulder flexion PROM with gentle stretch at end ROM  MANUAL THERAPY: To promote normalized muscle tension, improved flexibility, increased ROM, and reduced pain utilizing joint mobilization, connective tissue massage, therapeutic massage, manual TP therapy, and myofascial release.  STM/DTM and manual TPR to R pec major/minor and anterior deltoid R shoulder inferior and A/P glides  NEUROMUSCULAR RE-EDUCATION: To improve coordination, kinesthesia, posture, and proprioception. Lower trap setting at wall - "V" wall slide + slight lift-off x 10 Wall push-ups x 10 Serratus 3-way wall clocks with looped YTB at wrists x 10 bil   09/03/23 THERAPEUTIC EXERCISE: To improve strength and endurance.  Demonstration, verbal and tactile cues throughout for technique.  UBE - L2.0 x 6 min (3' fwd & 3' back)  THERAPEUTIC ACTIVITIES: To improve functional performance.  Demonstration, verbal and tactile cues throughout for technique. QuickDASH: 20.5 / 100 = 20.5 % MMT: scapular musculature & LE MMT  MANUAL THERAPY: To promote normalized muscle tension, increased ROM, and reduced pain utilizing connective tissue massage, therapeutic massage, and manual TP therapy. STM/DTM and manual TPR to R rhomboid, lower traps and subscapularis Reviewed options for self STM using ball on wall vs TheraCane   08/30/23 Therapeutic Activity: Assessment of goals for 10th visit note NEUROMUSCULAR RE-EDUCATION:  UBE - L2.0 x 6 min (3' fwd & 3' back) Rows 20lb 2x10 mid grip  Standing shld ext 15lb x 10- inc strain on collarbone Open books x 10 L side; 5x R -p! Afterwards MANUAL THERAPY: STM to R bicep attachment to coracoid, UT,LS Anterior shoulder stretch   PATIENT EDUCATION:  Education details: continue with current HEP  Person educated: Patient Education method: Explanation Education comprehension: verbalized understanding  HOME EXERCISE PROGRAM: Access Code: [email or web address] URL:  [email or web address] Date: 09/15/2023 Prepared by: Verta Ellen  Exercises - Seated Gentle Upper Trapezius Stretch  - 2 x daily - 7 x weekly - 3 reps - 30 sec hold - Gentle Levator Scapulae Stretch (Mirrored)  - 2 x daily - 7 x weekly - 3 reps - 30 sec hold - Standing Bilateral Low Shoulder Row with Anchored Resistance  - 1 x daily - 7 x weekly - 2 sets - 10 reps - 5 sec hold - Scapular Retraction with Resistance Advanced  - 1 x daily - 7 x weekly - 2 sets - 10 reps - 5 sec hold - Seated Thoracic Lumbar Extension  - 1  x daily - 7 x weekly - 2 sets - 10 reps - Seated Cervical Retraction  - 1 x daily - 7 x weekly - 2 sets - 10 reps - Bicep Stretch at Table  - 2 x daily - 7 x weekly - 2 sets - 10 reps - 3 sec hold - Doorway Pec Stretch at 60 Degrees Abduction with Arm Straight  - 2 x daily - 7 x weekly - 3 reps - 30 sec hold - Supine Thoracic Mobilization Towel Roll Vertical with Arm Stretch  - 1 x daily - 7 x weekly - 2 sets - 10 reps - 3 sec hold - Standing Pectoral Release with Ball at Wall  - 1-2 x daily - 7 x weekly - 1-2 min hold - Standing Infraspinatus/Teres Minor Release with Ball at [employer]  - 1-2 x daily - 7 x weekly - 1-2 min hold - Modified Posterior Capsule Stretch  - 2 x daily - 7 x weekly - 2 sets - 30 sec hold - Standing Thoracic Open Book at Wall  - 2 x daily - 7 x weekly - 2 sets - 10 reps  Patient Education - Kinesiology tape   ASSESSMENT:  CLINICAL IMPRESSION: Pt returned today with good report regarding her progress.  She is ready to be on hold , will dc in one month if no word.  She overall has good strength B shoulders, and reports able to manage her chronic pain if it starts to occur with cooking, etc.   OBJECTIVE IMPAIRMENTS: Abnormal gait, decreased activity tolerance, decreased balance, decreased mobility, difficulty walking, decreased ROM, decreased strength, increased fascial restrictions, impaired perceived functional ability, increased muscle  spasms, impaired UE functional use, postural dysfunction, and pain.   ACTIVITY LIMITATIONS: carrying, lifting, standing, squatting, stairs, transfers, reach over head, locomotion level, and caring for others  PARTICIPATION LIMITATIONS: meal prep, cleaning, laundry, driving, shopping, community activity, and yard work  PERSONAL FACTORS: Age, Behavior pattern, Education, Fitness, Past/current experiences, Social background, and Time since onset of injury/illness/exacerbation are also affecting patient's functional outcome.   REHAB POTENTIAL: Fair ongoing issues with pain since surgery January 2024, gross deconditioning, not compliant with HEP since DC from PT   CLINICAL DECISION MAKING: Stable/uncomplicated  EVALUATION COMPLEXITY: Low   GOALS: Goals reviewed with patient? Yes  SHORT TERM GOALS: Target date: 08/11/2023    Patient will be independent with initial HEP to improve outcomes and carryover.  Baseline:  Goal status: MET - 08/02/23  2.  Patient will report 25% improvement in B shoulder pain to improve QOL.   Baseline:  Goal status: MET - 08/16/23  3.  Will be able to dress/perform all self care activities without increased pain in neck and shoulders. Baseline:  Goal status: MET - 08/27/23 - no difficulty, no pain  LONG TERM GOALS: Target date: 09/08/2023; extended to 10/15/2023   Patient will be independent with ongoing/advanced HEP for self-management at home.  Baseline:  Goal status: IN PROGRESS - 09/29/23 - met for current HEP, good with today's progressions 10/07/23; met 2.  Patient will report 50-75% improvement in B shoulder pain to improve QOL.  Baseline:  Goal status: MET - 08/30/23  3.  Patient to demonstrate improved upright posture with posterior shoulder girdle engaged to promote improved glenohumeral joint mobility. Baseline:  Goal status: MET - 09/03/23  4.  R shoulder AROM to be equal to L without pain provocation to allow for increased ease of ADLs.  Baseline:   Goal status: IN  PROGRESS - 08/27/23 - overall R shoulder ROM improving, however L shoulder AROM still limited as compared to R with no change noted in abduction 10/07/23: Met except R shoulder IR reach behind back to T8, compared to L to T4  5.  MMT will have improved by at least one grade in all weak groups  Baseline: Goal status: MET - 08/30/23 10/07/23: excellent strength with MMT B shoulders 6  QuickDASH to have improved by at least 15 points to show improved QOL/subjective improvement  Baseline: 59.1% Goal status: MET - 09/03/23 - 20.5 / 100 = 20.5 %  7.  Patient will experience at least a 50% improvement in sciatica and low back discomfort    Baseline:  Goal status: IN PROGRESS - 08/30/23 - the pain comes and goes, slightly better  8. Will be able to perform exercise routine as desired without increase in pain in shoulders/back/LEs  Baseline:  Goal status: IN PROGRESS - 09/03/23  10/07/23: met so far   PLAN:  PT FREQUENCY: 1-2x/week  PT DURATION: 8 weeks  PLANNED INTERVENTIONS: 97164- PT Re-evaluation, 97110-Therapeutic exercises, 97530- Therapeutic activity, 97112- Neuromuscular re-education, 97535- Self Care, 11914- Manual therapy, [id number]- Aquatic Therapy, [id number]- Electrical stimulation (unattended), 97035- Ultrasound, [id number]- Ionotophoresis 4mg /ml Dexamethasone, Patient/Family education, Balance training, Taping, Dry Needling, Joint mobilization, Spinal mobilization, Cryotherapy, and Moist heat  PLAN FOR NEXT SESSION: hold PT at this time , pt has made good progress and met most goals   Jashayla Glatfelter L Floyed Masoud, PT , DPT, OCS 10/07/23 5:16 PM

## 2023-10-26 ENCOUNTER — Ambulatory Visit (INDEPENDENT_AMBULATORY_CARE_PROVIDER_SITE_OTHER): Admitting: Family Medicine

## 2023-10-26 ENCOUNTER — Encounter: Payer: Self-pay | Admitting: Family Medicine

## 2023-10-26 VITALS — BP 126/74 | HR 87 | Temp 98.0°F | Resp 16 | Ht 67.0 in | Wt 203.0 lb

## 2023-10-26 DIAGNOSIS — F430 Acute stress reaction: Secondary | ICD-10-CM | POA: Diagnosis not present
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
## 2023-10-26 NOTE — Patient Instructions (Signed)
Aim to do some physical exertion for 150 minutes per week. This is typically divided into 5 days per week, 30 minutes per day. The activity should be enough to get your heart rate up. Anything is better than nothing if you have time constraints.  If you do not hear anything about your referral in the next 1-2 weeks, call our office and ask for an update.  Let us know if you need anything.

## 2023-10-27 ENCOUNTER — Encounter: Payer: Self-pay | Admitting: Family Medicine

## 2023-10-27 DIAGNOSIS — G309 Alzheimer's disease, unspecified: Secondary | ICD-10-CM

## 2023-10-28 ENCOUNTER — Ambulatory Visit (INDEPENDENT_AMBULATORY_CARE_PROVIDER_SITE_OTHER): Admitting: Licensed Clinical Social Worker

## 2023-10-28 DIAGNOSIS — F419 Anxiety disorder, unspecified: Secondary | ICD-10-CM | POA: Diagnosis not present
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
## 2023-11-06 ENCOUNTER — Other Ambulatory Visit: Payer: Self-pay | Admitting: Family Medicine

## 2023-11-08 ENCOUNTER — Encounter: Payer: Self-pay | Admitting: Neurology

## 2023-11-09 NOTE — Progress Notes (Signed)
 [facility] [address] [place]. [place] [address] [phone]       OFFICE FOLLOW UP NOTE  Ms. Tonya [name] Date of Birth:  [birth year] Medical Record Number:  [id number]    Primary neurologist: Dr. Albertina Hugger Reason for visit: Alzheimer's disease    SUBJECTIVE:  CHIEF COMPLAINT:  Chief Complaint  Patient presents with   MCI    RM 3 alone  Pt is well, reports her cognition is about the same as last visit. She has not noticed much change since last visit.     Follow-up visit:  Prior visit: 08/13/2023  Brief HPI:   Tonya [name] is a 73 y.o. female who was evaluated by Dr. Albertina Hugger for memory concerns over the past year.  Noted cognitive changes associated with language difficulties. MOCA 22/30 at initial visit per initial consult note. MRI brain unremarkable.  PET scan 03/2023 consistent with Alzheimer's disease.  Alzheimer's biomarkers also consistent with AD pathology (beta amyloid ratio 0.096, p-tau181 2.54).  APOE E3/E3.  Neurocognitive testing confirms somatic aphasia and MCI.  Discussed initiating Leqembi at f/u visit with Dr. Albertina Hugger in 05/2023. MOCA at initial visit 02/2023 documented in note at 22/30 but for unclear reason, MOCA is currently documented at 29/30 from same date. Due to essentially normal score, insurance denied Leqembi infusions.   At prior visit, complained of gradual memory decline. MOCA 26/30. As this testing still considered within normal range, unable to pursue Leqembi at that time.    Interval history:  Patient returns for 76-month follow-up visit for repeat MoCA testing. MOCA today 28/30 (prior 26/30).  Feels overall cognition has been stable but can fluctuate especially with fatigue and stress.  She has been experiencing increased stress more recently more so with her husband.  She has been trying to reestablish care with psychology, recently seen by psychologist but was told she does not treat anxiety and has since been  referred to a different psychologist and is scheduled next week.  She is also interested in working with Alethea Hutchinson speech pathologist as recommended by Dr. Alec Huntington.  She occasionally has word finding difficulty which can be worse with stress or a feeling pressure.      ROS:   14 system review of systems performed and negative with exception of those listed in HPI  PMH:  Past Medical History:  Diagnosis Date   Allergy June 2012   Seasonal   Asthma 2015   Cancer [facility]) 2010   Left Breast   Coronary atherosclerosis due to calcified coronary lesion    GERD (gastroesophageal reflux disease)    Many years ago   History of breast cancer    Hyperlipidemia    Hypertension    Osteopenia     PSH:  Past Surgical History:  Procedure Laterality Date   ABDOMINAL HYSTERECTOMY  2015   AUGMENTATION MAMMAPLASTY Left    BREAST BIOPSY Right 02/2019   BREAST IMPLANT EXCHANGE     COLONOSCOPY     COSMETIC SURGERY  2010   Breast cancer related   FOOT SURGERY Left 05/25/2020   MASTECTOMY Left    REDUCTION MAMMAPLASTY Right    ROTATOR CUFF REPAIR Right 07/17/2022   TONSILLECTOMY AND ADENOIDECTOMY  1958    Social History:  Social History   Socioeconomic History   Marital status: Married    Spouse name: Not on file   Number of children: 1   Years of education: Not on file   Highest education level: Bachelor's degree (e.g.,  BA, AB, BS)  Occupational History   Not on file  Tobacco Use   Smoking status: Never   Smokeless tobacco: Never  Vaping Use   Vaping status: Never Used  Substance and Sexual Activity   Alcohol use: Yes    Comment: rarely   Drug use: Never   Sexual activity: Yes    Birth control/protection: None  Other Topics Concern   Not on file  Social History Narrative   Not on file   Social Drivers of Health   Financial Resource Strain: Low Risk  (06/29/2023)   Overall Financial Resource Strain (CARDIA)    Difficulty of Paying Living Expenses: Not hard at all   Food Insecurity: No Food Insecurity (06/29/2023)   Hunger Vital Sign    Worried About Running Out of Food in the Last Year: Never true    Ran Out of Food in the Last Year: Never true  Transportation Needs: No Transportation Needs (06/29/2023)   PRAPARE - Administrator, Civil Service (Medical): No    Lack of Transportation (Non-Medical): No  Physical Activity: Inactive (06/29/2023)   Exercise Vital Sign    Days of Exercise per Week: 0 days    Minutes of Exercise per Session: 20 min  Stress: No Stress Concern Present (06/29/2023)   [employer] of Occupational Health - Occupational Stress Questionnaire    Feeling of Stress : Only a little  Social Connections: Moderately Isolated (06/29/2023)   Social Connection and Isolation Panel [NHANES]    Frequency of Communication with Friends and Family: More than three times a week    Frequency of Social Gatherings with Friends and Family: Once a week    Attends Religious Services: Never    Database administrator or Organizations: No    Attends Banker Meetings: Never    Marital Status: Married  Catering manager Violence: Not At Risk (11/19/2022)   Humiliation, Afraid, Rape, and Kick questionnaire    Fear of Current or Ex-Partner: No    Emotionally Abused: No    Physically Abused: No    Sexually Abused: No    Family History:  Family History  Problem Relation Age of Onset   Heart attack Mother    Arthritis Mother    Early death Mother    Heart attack Father        died from   Early death Father    Colon cancer Brother 38 - 9   Lung disease Brother    Early death Brother    Cancer Brother    Cancer Brother    Obesity Brother    Breast cancer Maternal Aunt    Breast cancer Paternal Aunt    Colon cancer Cousin 30 - 35   Esophageal cancer Neg Hx    Rectal cancer Neg Hx    Stomach cancer Neg Hx     Medications:   Current Outpatient Medications on File Prior to Visit  Medication Sig Dispense Refill    aspirin  EC (ASPIRIN  LOW DOSE) 81 MG tablet TAKE 1 TABLET (81 MG TOTAL) BY MOUTH DAILY. SWALLOW WHOLE. 90 tablet 3   atenolol  (TENORMIN ) 50 MG tablet TAKE 1 TABLET BY MOUTH EVERY DAY 90 tablet 3   calcium -vitamin D (OSCAL WITH D) 250-125 MG-UNIT tablet Take 1 tablet by mouth daily.     losartan  (COZAAR ) 50 MG tablet TAKE 1 TABLET BY MOUTH EVERY DAY 90 tablet 1   lovastatin  (MEVACOR ) 10 MG tablet Take 1 tablet (10 mg total)  by mouth at bedtime. 90 tablet 1   montelukast  (SINGULAIR ) 10 MG tablet TAKE 1 TABLET BY MOUTH EVERYDAY AT BEDTIME 90 tablet 3   Multiple Vitamins-Minerals (MULTIVITAMIN ADULTS 50+) TABS Take by mouth.     pantoprazole  (PROTONIX ) 20 MG tablet TAKE 1 TABLET BY MOUTH EVERY DAY 90 tablet 0   No current facility-administered medications on file prior to visit.    Allergies:   Allergies  Allergen Reactions   Crestor  [Rosuvastatin ]     Myalgias    Lipitor [Atorvastatin ]     myalgias      OBJECTIVE:  Physical Exam  Vitals:   11/10/23 1017  BP: 125/72  Pulse: 67  Weight: 204 lb (92.5 kg)  Height: 5\' 7"  (1.702 m)   Body mass index is 31.95 kg/m. No results found.  General: well developed, well nourished, very pleasant elderly Caucasian female, seated, in no evident distress  Neurologic Exam Mental Status: Awake and fully alert.  Occasional word finding difficulty and speech hesitancy.  Oriented to place and time. Recent memory impaired and remote memory intact. Attention span, concentration and fund of knowledge appropriate during visit. Mood and affect appropriate.  Cranial Nerves: Pupils equal, briskly reactive to light. Extraocular movements full without nystagmus. Visual fields full to confrontation. Hearing intact. Facial sensation intact. Face, tongue, palate moves normally and symmetrically.  Motor: Normal bulk and tone. Normal strength in all tested extremity muscles Gait and Station: Arises from chair without difficulty. Stance is normal. Gait  demonstrates normal stride length and balance without use of AD.       11/10/2023   10:21 AM 08/13/2023   11:13 AM 03/01/2023    2:08 PM  Montreal Cognitive Assessment   Visuospatial/ Executive (0/5) 5 5 5   Naming (0/3) 3 3 3   Attention: Read list of digits (0/2) 2 1 2   Attention: Read list of letters (0/1) 1 1 1   Attention: Serial 7 subtraction starting at 100 (0/3) 2 3 3   Language: Repeat phrase (0/2) 2 2 2   Language : Fluency (0/1) 1 0 0  Abstraction (0/2) 2 1 2   Delayed Recall (0/5) 4 4 5   Orientation (0/6) 6 6 6   Total 28 26 29          ASSESSMENT/PLAN: Tonya [name] is a 73 y.o. year old female      MCI due to Alzheimer's disease:  MOCA today 28/30 (26/30)  In order to be eligible for Leqembi, MOCA score needs to be between 18-25 showing mild cognitive impairment Referral placed [facility] as requested Encouraged follow-up with psychology for management of anxiety as untreated anxiety/stress can affect memory/cognition ATN profile beta amyloid ratio 0.096, p-tau181 2.54 APOE E3/E3, negative for APO E4 variant PET scan 03/2023 positive amyloid with moderate to frequent neuritic beta amyloid plaques MR brain 02/2023 unremarkable     Follow up in 3 months for repeat MOCA with Dr. Albertina Hugger (due to availability) or call earlier if needed   CC:  PCP: Jobe Mulder, DO    I spent 30 minutes of face-to-face and non-face-to-face time with patient.  This included previsit chart review, lab review, study review, order entry, electronic health record documentation, patient education and discussion regarding above diagnoses and treatment plan and answered all other questions to patient's satisfaction  Johny Nap, [facility]  [facility] [address] [place], [place] [address]  Phone [phone] Fax [phone] Note: This document was prepared with digital dictation and possible smart phrase  technology.  Any transcriptional errors that result from this process are unintentional.

## 2023-11-10 ENCOUNTER — Ambulatory Visit: Payer: PPO | Admitting: Adult Health

## 2023-11-10 ENCOUNTER — Telehealth: Payer: Self-pay | Admitting: Adult Health

## 2023-11-10 ENCOUNTER — Encounter: Payer: Self-pay | Admitting: Adult Health

## 2023-11-10 VITALS — BP 125/72 | HR 67 | Ht 67.0 in | Wt 204.0 lb

## 2023-11-10 DIAGNOSIS — G309 Alzheimer's disease, unspecified: Secondary | ICD-10-CM | POA: Diagnosis not present

## 2023-11-10 DIAGNOSIS — R4701 Aphasia: Secondary | ICD-10-CM

## 2023-11-10 DIAGNOSIS — G3184 Mild cognitive impairment, so stated: Secondary | ICD-10-CM

## 2023-11-10 NOTE — Patient Instructions (Addendum)
 Your Plan:   Your memory testing today is still considered with normal limits, currently at 28/30, previously 26/30. As discussed, this test needs to be between 18-25 in order for insurance to cover Leqembi. Referral placed to [facility] speech therapist at [facility]. I would like for you to return back in 3 months with Dr. Albertina Hugger for repeat MOCA testing.      Thank you for coming to see us  at [facility]. I hope we have been able to provide you high quality care today.  You may receive a patient satisfaction survey over the next few weeks. We would appreciate your feedback and comments so that we may continue to improve ourselves and the health of our patients.

## 2023-11-10 NOTE — Telephone Encounter (Signed)
 Referral for speech therapy fax to [facility] to see therapist, Alethea Hutchinson. Phone: [phone], Fax: [phone]

## 2023-11-18 ENCOUNTER — Encounter: Payer: Self-pay | Admitting: Cardiology

## 2023-11-18 ENCOUNTER — Other Ambulatory Visit: Payer: Self-pay | Admitting: Family Medicine

## 2023-11-18 ENCOUNTER — Ambulatory Visit: Admitting: Behavioral Health

## 2023-11-18 DIAGNOSIS — Z1231 Encounter for screening mammogram for malignant neoplasm of breast: Secondary | ICD-10-CM

## 2023-11-18 DIAGNOSIS — F419 Anxiety disorder, unspecified: Secondary | ICD-10-CM
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
## 2023-11-18 NOTE — Progress Notes (Signed)
   Tonya Lever, LMFT

## 2023-11-19 ENCOUNTER — Encounter: Payer: Self-pay | Admitting: Family Medicine

## 2023-11-19 DIAGNOSIS — M8580 Other specified disorders of bone density and structure, unspecified site: Secondary | ICD-10-CM

## 2023-11-19 NOTE — Telephone Encounter (Signed)
 Its a 3 year follow up study.  If possible - try to have the echo done prior to office visit if no limitations.  Thanks.   Kassadie Pancake [place], DO, FACC

## 2023-11-22 ENCOUNTER — Other Ambulatory Visit: Payer: Self-pay

## 2023-11-22 DIAGNOSIS — M8580 Other specified disorders of bone density and structure, unspecified site: Secondary | ICD-10-CM

## 2023-11-22 NOTE — Telephone Encounter (Signed)
Okay.  Floyde Dingley, DO, FACC

## 2023-11-24 DIAGNOSIS — R488 Other symbolic dysfunctions: Secondary | ICD-10-CM | POA: Diagnosis not present

## 2023-11-24 DIAGNOSIS — L820 Inflamed seborrheic keratosis: Secondary | ICD-10-CM | POA: Diagnosis not present

## 2023-11-24 DIAGNOSIS — D1801 Hemangioma of skin and subcutaneous tissue: Secondary | ICD-10-CM | POA: Diagnosis not present

## 2023-11-24 DIAGNOSIS — L821 Other seborrheic keratosis: Secondary | ICD-10-CM | POA: Diagnosis not present

## 2023-11-24 DIAGNOSIS — Z129 Encounter for screening for malignant neoplasm, site unspecified: Secondary | ICD-10-CM | POA: Diagnosis not present

## 2023-11-24 DIAGNOSIS — R41841 Cognitive communication deficit: Secondary | ICD-10-CM | POA: Diagnosis not present

## 2023-11-24 DIAGNOSIS — Z85828 Personal history of other malignant neoplasm of skin: Secondary | ICD-10-CM | POA: Diagnosis not present

## 2023-12-03 DIAGNOSIS — R488 Other symbolic dysfunctions: Secondary | ICD-10-CM | POA: Diagnosis not present

## 2023-12-03 DIAGNOSIS — R41841 Cognitive communication deficit: Secondary | ICD-10-CM | POA: Diagnosis not present

## 2023-12-07 ENCOUNTER — Ambulatory Visit: Admitting: Behavioral Health

## 2023-12-07 DIAGNOSIS — F419 Anxiety disorder, unspecified: Secondary | ICD-10-CM

## 2023-12-07 DIAGNOSIS — R4589 Other symptoms and signs involving emotional state: Secondary | ICD-10-CM

## 2023-12-07 NOTE — Progress Notes (Signed)
   Deneise Lever, LMFT

## 2023-12-08 DIAGNOSIS — R41841 Cognitive communication deficit: Secondary | ICD-10-CM | POA: Diagnosis not present

## 2023-12-08 DIAGNOSIS — R488 Other symbolic dysfunctions: Secondary | ICD-10-CM | POA: Diagnosis not present

## 2023-12-13 ENCOUNTER — Other Ambulatory Visit (HOSPITAL_BASED_OUTPATIENT_CLINIC_OR_DEPARTMENT_OTHER)

## 2023-12-13 ENCOUNTER — Ambulatory Visit (HOSPITAL_BASED_OUTPATIENT_CLINIC_OR_DEPARTMENT_OTHER)
Admission: RE | Admit: 2023-12-13 | Discharge: 2023-12-13 | Disposition: A | Discharge status: Home / Self Care | Source: Ambulatory Visit | Attending: Family Medicine | Admitting: Family Medicine

## 2023-12-13 ENCOUNTER — Ambulatory Visit: Payer: Self-pay | Admitting: Family Medicine

## 2023-12-13 DIAGNOSIS — M8589 Other specified disorders of bone density and structure, multiple sites: Secondary | ICD-10-CM | POA: Insufficient documentation

## 2023-12-13 DIAGNOSIS — Z780 Asymptomatic menopausal state: Secondary | ICD-10-CM | POA: Diagnosis not present

## 2023-12-13 DIAGNOSIS — M8580 Other specified disorders of bone density and structure, unspecified site: Secondary | ICD-10-CM | POA: Insufficient documentation

## 2023-12-13 DIAGNOSIS — Z13820 Encounter for screening for osteoporosis: Secondary | ICD-10-CM | POA: Diagnosis not present

## 2023-12-15 DIAGNOSIS — R41841 Cognitive communication deficit: Secondary | ICD-10-CM | POA: Diagnosis not present

## 2023-12-15 DIAGNOSIS — R488 Other symbolic dysfunctions: Secondary | ICD-10-CM | POA: Diagnosis not present

## 2023-12-22 DIAGNOSIS — R41841 Cognitive communication deficit: Secondary | ICD-10-CM | POA: Diagnosis not present

## 2023-12-22 DIAGNOSIS — R488 Other symbolic dysfunctions: Secondary | ICD-10-CM | POA: Diagnosis not present

## 2023-12-27 ENCOUNTER — Ambulatory Visit: Admitting: Behavioral Health

## 2023-12-29 DIAGNOSIS — R488 Other symbolic dysfunctions: Secondary | ICD-10-CM | POA: Diagnosis not present

## 2023-12-29 DIAGNOSIS — R41841 Cognitive communication deficit: Secondary | ICD-10-CM | POA: Diagnosis not present

## 2024-01-03 ENCOUNTER — Ambulatory Visit
Admission: RE | Admit: 2024-01-03 | Discharge: 2024-01-03 | Disposition: A | Discharge status: Home / Self Care | Source: Ambulatory Visit | Attending: Family Medicine

## 2024-01-03 DIAGNOSIS — Z1231 Encounter for screening mammogram for malignant neoplasm of breast: Secondary | ICD-10-CM | POA: Diagnosis not present

## 2024-01-10 DIAGNOSIS — R41841 Cognitive communication deficit: Secondary | ICD-10-CM | POA: Diagnosis not present

## 2024-01-11 ENCOUNTER — Ambulatory Visit: Admitting: Behavioral Health

## 2024-01-18 ENCOUNTER — Ambulatory Visit (INDEPENDENT_AMBULATORY_CARE_PROVIDER_SITE_OTHER): Admitting: Behavioral Health

## 2024-01-18 DIAGNOSIS — F419 Anxiety disorder, unspecified: Secondary | ICD-10-CM | POA: Diagnosis not present
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
## 2024-01-20 ENCOUNTER — Encounter: Payer: PPO | Admitting: Psychology

## 2024-01-25 ENCOUNTER — Ambulatory Visit (INDEPENDENT_AMBULATORY_CARE_PROVIDER_SITE_OTHER): Admitting: Behavioral Health

## 2024-01-25 DIAGNOSIS — F419 Anxiety disorder, unspecified: Secondary | ICD-10-CM
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
## 2024-01-26 ENCOUNTER — Other Ambulatory Visit: Payer: Self-pay | Admitting: Family Medicine

## 2024-02-02 ENCOUNTER — Other Ambulatory Visit: Payer: Self-pay | Admitting: Family Medicine

## 2024-02-07 ENCOUNTER — Encounter: Payer: Self-pay | Admitting: Neurology

## 2024-02-07 ENCOUNTER — Ambulatory Visit: Admitting: Neurology

## 2024-02-07 VITALS — BP 112/66 | HR 77 | Ht 67.0 in | Wt 202.4 lb

## 2024-02-07 DIAGNOSIS — G309 Alzheimer's disease, unspecified: Secondary | ICD-10-CM | POA: Diagnosis not present

## 2024-02-07 DIAGNOSIS — G3184 Mild cognitive impairment, so stated: Secondary | ICD-10-CM | POA: Insufficient documentation

## 2024-02-07 MED ORDER — DONEPEZIL HCL 5 MG PO TABS: 5.0000 mg | ORAL_TABLET | Freq: Every day | ORAL | 9 refills | Status: DC

## 2024-02-07 NOTE — Progress Notes (Signed)
 Provider:  Dedra Gores, MD  Primary Care Physician:  Frann Mabel Mt, DO [address] [place] [address]     Referring Provider: Frann Mabel Mt Rosalea [address] [place],  [place] [address]          Chief Complaint according to patient   Patient presents with:                HISTORY OF PRESENT ILLNESS:  Tonya [name] is a 73 y.o. female patient who is here for revisit 02/07/2024 for memory follow up on Lequembi. .    Chief concern according to patient :  Alzheimer's Disease, confirmed by pathology ATN, PET amyloid scan and neuro-psychological testing with Dr Loralyn confirmed semantic aphasia and MCI. The patient has noted progression since her last visit in December and is ready to start Leqembi.   word fluency is still affected     Tonya [name] is a 73 y.o. female and seen here upon referral from Dr. Frann for a Consultation/ Evaluation of cognitive decline, delayed word finding, naming - even her husband's name did escape her recently.  Easily distracted and derailed thoughts when interrupted. here with husband Tawyna Pellot. Pt is here for memory problem that  evolved over  the past 2 years As the day goes on she forgets things, specially names.  States she has forgotten her husbands name a few times.  States when she is writing something she forgets how to spell words.  In the middle of conversation , if she is interrupted, she will forgets completely what the conversation was about.    This patient reports onset of aphasia over a period of 2 years.    Past medical history: 6 mo of b/l hip pain, worse on R side. Painful on outer hip and also groin region. No catching/locking, back pain, bruising, redness, swelling. Will sometimes take Tylenol .no neuro s/s's. No famhx of OA that she is aware of.      Social history : Chemical engineer degree, HS graduate, professional career.  Retired at [age].   Married, adult child 58 year old daughter. Non smoker, social drinker, 1 glass once a month.     Review of Systems: Out of a complete 14 system review, the patient complains of only the following symptoms, and all other reviewed systems are negative.:   MCI - subjective with positive ATN , AD bio markers, in speech therapy.   GDS 1/ 15    Social History   Socioeconomic History   Marital status: Married    Spouse name: Not on file   Number of children: 1   Years of education: Not on file   Highest education level: Bachelor's degree (e.g., BA, AB, BS)  Occupational History   Not on file  Tobacco Use   Smoking status: Never   Smokeless tobacco: Never  Vaping Use   Vaping status: Never Used  Substance and Sexual Activity   Alcohol use: Yes    Comment: rarely   Drug use: Never   Sexual activity: Yes    Birth control/protection: None  Other Topics Concern   Not on file  Social History Narrative   Not on file   Social Drivers of Health   Financial Resource Strain: Low Risk  (06/29/2023)   Overall Financial Resource Strain (CARDIA)    Difficulty of Paying Living Expenses: Not hard at all  Food Insecurity:  No Food Insecurity (06/29/2023)   Hunger Vital Sign    Worried About Running Out of Food in the Last Year: Never true    Ran Out of Food in the Last Year: Never true  Transportation Needs: No Transportation Needs (06/29/2023)   PRAPARE - Administrator, Civil Service (Medical): No    Lack of Transportation (Non-Medical): No  Physical Activity: Inactive (06/29/2023)   Exercise Vital Sign    Days of Exercise per Week: 0 days    Minutes of Exercise per Session: 20 min  Stress: No Stress Concern Present (06/29/2023)   [employer] of Occupational Health - Occupational Stress Questionnaire    Feeling of Stress : Only a little  Social Connections: Moderately Isolated (06/29/2023)   Social Connection and Isolation Panel    Frequency of Communication with Friends and  Family: More than three times a week    Frequency of Social Gatherings with Friends and Family: Once a week    Attends Religious Services: Never    Database administrator or Organizations: No    Attends Engineer, structural: Not on file    Marital Status: Married    Family History  Problem Relation Age of Onset   Heart attack Mother    Arthritis Mother    Early death Mother    Heart attack Father        died from   Early death Father    Colon cancer Brother 50 - 55   Lung disease Brother    Early death Brother    Cancer Brother    Cancer Brother    Obesity Brother    Breast cancer Maternal Aunt    Breast cancer Paternal Aunt    Colon cancer Cousin 30 - 35   Esophageal cancer Neg Hx    Rectal cancer Neg Hx    Stomach cancer Neg Hx     Past Medical History:  Diagnosis Date   Allergy June 2012   Seasonal   Asthma 2015   Cancer [facility]) 2010   Left Breast   Coronary atherosclerosis due to calcified coronary lesion    GERD (gastroesophageal reflux disease)    Many years ago   History of breast cancer    Hyperlipidemia    Hypertension    Osteopenia     Past Surgical History:  Procedure Laterality Date   ABDOMINAL HYSTERECTOMY  2015   AUGMENTATION MAMMAPLASTY Left    BREAST BIOPSY Right 02/2019   BREAST IMPLANT EXCHANGE     COLONOSCOPY     COSMETIC SURGERY  2010   Breast cancer related   FOOT SURGERY Left 05/25/2020   MASTECTOMY Left    REDUCTION MAMMAPLASTY Right    ROTATOR CUFF REPAIR Right 07/17/2022   TONSILLECTOMY AND ADENOIDECTOMY  1958     Current Outpatient Medications on File Prior to Visit  Medication Sig Dispense Refill   aspirin  EC (ASPIRIN  LOW DOSE) 81 MG tablet TAKE 1 TABLET (81 MG TOTAL) BY MOUTH DAILY. SWALLOW WHOLE. 90 tablet 3   atenolol  (TENORMIN ) 50 MG tablet TAKE 1 TABLET BY MOUTH EVERY DAY 90 tablet 3   calcium -vitamin D (OSCAL WITH D) 250-125 MG-UNIT tablet Take 1 tablet by mouth daily.     losartan  (COZAAR ) 50 MG tablet TAKE 1  TABLET BY MOUTH EVERY DAY 90 tablet 1   lovastatin  (MEVACOR ) 10 MG tablet Take 1 tablet (10 mg total) by mouth at bedtime. 90 tablet 1   montelukast  (SINGULAIR ) 10 MG  tablet TAKE 1 TABLET BY MOUTH EVERYDAY AT BEDTIME 90 tablet 3   Multiple Vitamins-Minerals (MULTIVITAMIN ADULTS 50+) TABS Take by mouth.     pantoprazole  (PROTONIX ) 20 MG tablet Take 1 tablet (20 mg total) by mouth daily. 90 tablet 0   No current facility-administered medications on file prior to visit.    Allergies  Allergen Reactions   Crestor  [Rosuvastatin ]     Myalgias    Lipitor [Atorvastatin ]     myalgias     DIAGNOSTIC DATA (LABS, IMAGING, TESTING) - I reviewed patient records, labs, notes, testing and imaging myself where available.  Lab Results  Component Value Date   WBC 9.1 08/17/2023   HGB 13.1 08/17/2023   HCT 39.7 08/17/2023   MCV 98.8 08/17/2023   PLT 204 08/17/2023      Component Value Date/Time   NA 137 08/17/2023 1432   NA 143 07/16/2023 1148   K 4.0 08/17/2023 1432   CL 101 08/17/2023 1432   CO2 26 08/17/2023 1432   GLUCOSE 100 (H) 08/17/2023 1432   BUN 17 08/17/2023 1432   BUN 16 07/16/2023 1148   CREATININE 0.76 08/17/2023 1432   CALCIUM  9.3 08/17/2023 1432   PROT 7.4 08/17/2023 1432   PROT 6.8 07/16/2023 1148   ALBUMIN 4.3 08/17/2023 1432   ALBUMIN 4.5 07/16/2023 1148   AST 26 08/17/2023 1432   ALT 21 08/17/2023 1432   ALKPHOS 70 08/17/2023 1432   BILITOT 1.0 08/17/2023 1432   BILITOT 0.4 07/16/2023 1148   GFRNONAA >60 08/17/2023 1432   GFRAA 87 08/25/2019 0919   Lab Results  Component Value Date   CHOL 179 03/02/2023   HDL 67 03/02/2023   LDLCALC 94 03/02/2023   LDLDIRECT 99 03/02/2023   TRIG 101 03/02/2023   CHOLHDL 2 06/19/2022   Lab Results  Component Value Date   HGBA1C 5.7 05/08/2022   Lab Results  Component Value Date   VITAMINB12 363 02/03/2023   Lab Results  Component Value Date   TSH 1.31 09/20/2020    PHYSICAL EXAM:  Vitals:   02/07/24 1431   BP: 112/66  Pulse: 77   No data found. Body mass index is 31.7 kg/m.   Wt Readings from Last 3 Encounters:  02/07/24 202 lb 6.4 oz (91.8 kg)  11/10/23 204 lb (92.5 kg)  10/26/23 203 lb (92.1 kg)     Ht Readings from Last 3 Encounters:  11/10/23 5' 7 (1.702 m)  10/26/23 5' 7 (1.702 m)  08/20/23 5' 7 (1.702 m)      General: The patient is awake, alert and appears not in acute distress and groomed. Head: Normocephalic, atraumatic.  Neck is supple.  Dental status:  biological  Cardiovascular:  Regular rate and cardiac rhythm by pulse, without distended neck veins. Respiratory: no shortness of breath  Skin:  Without evidence of ankle edema, or rash. Trunk: BMI is 31.7    NEUROLOGIC EXAM: The patient is awake and alert, oriented to place and time.   Memory subjective described as intact.  Attention span & concentration ability appears normal.   Speech is  fairly fluent,  without  dysarthria, dysphonia or aphasia.  Mood and affect are anxious.    Cranial nerves: Pupils are equal and briskly reactive to light. Funduscopic exam without  evidence of pallor or edema. Extraocular movements  in vertical and horizontal planes intact and without nystagmus. Visual fields by finger perimetry are intact. Hearing to finger rub intact.  Facial sensation intact to fine touch.  Facial motor strength is symmetric and tongue and uvula move midline.   Motor exam:   Normal tone and normal muscle bulk and symmetric normal strength in all extremities. Grip Strength intact . Restricted ROM for neck , rotation and flexion and extension.  Proximal strength of shoulder muscles and hip flexors was intact .   Sensory:  Fine touch and vibration were tested. Proprioception was tested in the upper extremities only and was  normal. Reflexes: Normal and symmetric throughout.     Assessment ; In spite of positive Biomarkers , there is little memory impairment. There are forms of AD that only manifest  with aphasia and not with visio-spatially , and this patient's STM is excellent, The  Initial MOCA was 22 points, now 29 points. Retesting yearly need to change MOCA version. Dr. Claudis testing was most interesting ;  fairly normal except verb fluency and writing-  Hearing test was fairly normal, but she  has trouble in noisy environments.  She feels overstimulated.  The patient  does well with number's -she has an accounting degree.  She had a positive PET amyloid scan- I would love to offer there  MAB therapy, but  insurance will not cover it at this time.      1)  I offered Aricept  5 mg daily,  its not a silver bullett and may help with word finding. I called in this medication.   2)  will use an alternative MOCA test in 9 months.   3)  continue ST , word finding is delayed.   4)  Keep regular meal times, hydrate well, exercise daily, keep regular bedtime and rise time, expose yourself to day light- walk in a green space.      I would like to thank Frann Mabel Deward Rosalea [address] [place],  [place] [address] for allowing me to meet with this pleasant patient.   The patient's condition requires frequent monitoring and adjustments in the treatment plan, reflecting the ongoing complexity of care.  This provider is the continuing focal point for all needed services for this condition.  After spending a total time of  35  minutes face to face and time for  history taking, physical and neurologic examination, review of laboratory studies,  personal review of imaging studies, reports and results of other testing and review of referral information / records as far as provided in visit,   Electronically signed by: Dedra Gores, MD 02/07/2024 2:53 PM  [facility] Board certified by The [employer] of Sleep Medicine and Diplomate of the [employer] of Sleep Medicine. Board certified In Neurology through the ABPN, Fellow  of the [employer] of Neurology.

## 2024-02-07 NOTE — Patient Instructions (Addendum)
 Donepezil  Tablets What is this medication? DONEPEZIL  (doe NEP e zil) treats memory loss and confusion (dementia) in people who have Alzheimer disease. It works by improving attention, memory, and the ability to engage in daily activities. It is not a cure for dementia or Alzheimer disease. This medicine may be used for other purposes; ask your health care provider or pharmacist if you have questions. COMMON BRAND NAME(S): Aricept  What should I tell my care team before I take this medication? They need to know if you have any of these conditions: Head injury Heart disease Irregular heartbeat or rhythm Liver disease Lung or breathing disease, such as asthma Seizures Stomach ulcers, other stomach or intestine problems Stomach bleeding Trouble passing urine An unusual or allergic reaction to donepezil , other medications, foods, dyes, or preservatives Pregnant or trying to get pregnant Breastfeeding How should I use this medication? Take this medication by mouth with a glass of water. Follow the directions on the prescription label. You may take this medication with or without food. Take this medication at regular intervals. This medication is usually taken before bedtime. Do not take it more often than directed. Continue to take your medication even if you feel better. Do not stop taking except on your care team's advice. If you are taking the 23 mg donepezil  tablet, swallow it whole; do not cut, crush, or chew it. Talk to your care team about the use of this medication in children. Special care may be needed. Overdosage: If you think you have taken too much of this medicine contact a poison control center or emergency room at once. NOTE: This medicine is only for you. Do not share this medicine with others. What if I miss a dose? If you miss a dose, take it as soon as you can. If it is almost time for your next dose, take only that dose, do not take double or extra doses. What may interact  with this medication? Do not take this medication with any of the following: Certain medications for fungal infections, such as itraconazole, fluconazole , posaconazole, voriconazole Cisapride Dextromethorphan; quinidine Dronedarone Pimozide Quinidine Thioridazine This medication may also interact with the following: Antihistamines for allergy, cough, and cold Atropine Bethanechol Carbamazepine Certain medications for bladder problems, such as oxybutynin or tolterodine Certain medications for Parkinson disease, such as benztropine or trihexyphenidyl Certain medications for stomach problems, such as dicyclomine or hyoscyamine Certain medications for travel sickness, such as scopolamine Dexamethasone  Dofetilide Ipratropium NSAIDs, medications for pain and inflammation, such as ibuprofen  or naproxen  Other medications for Alzheimer disease Other medications that cause heart rhythm changes Phenobarbital Phenytoin Rifampin, rifabutin, or rifapentine Ziprasidone This list may not describe all possible interactions. Give your health care provider a list of all the medicines, herbs, non-prescription drugs, or dietary supplements you use. Also tell them if you smoke, drink alcohol, or use illegal drugs. Some items may interact with your medicine. What should I watch for while using this medication? Visit your care team for regular checks on your progress. Tell your care team if your symptoms do not start to get better or if they get worse. This medication may affect your coordination, reaction time, or judgment. Do not drive or operate machinery until you know how this medication affects you. Sit up or stand slowly to reduce the risk of dizzy or fainting spells. Drinking alcohol with this medication can increase the risk of these side effects. What side effects may I notice from receiving this medication? Side effects that you should report  to your care team as soon as possible: Allergic  reactions--skin rash, itching, hives, swelling of the face, lips, tongue, or throat Peptic ulcer--burning stomach pain, loss of appetite, bloating, burping, heartburn, nausea, vomiting Seizures Slow heartbeat--dizziness, feeling faint or lightheaded, confusion, trouble breathing, unusual weakness or fatigue Stomach bleeding--bloody or black, tar-like stools, vomiting blood or brown material that looks like coffee grounds Trouble passing urine Side effects that usually do not require medical attention (report these to your care team if they continue or are bothersome): Diarrhea Fatigue Loss of appetite Muscle pain or cramps Nausea Trouble sleeping This list may not describe all possible side effects. Call your doctor for medical advice about side effects. You may report side effects to FDA at 1-800-FDA-1088. Where should I keep my medication? Keep out of reach of children. Store at room temperature between 15 and 30 degrees C (59 and 86 degrees F). Throw away any unused medication after the expiration date. NOTE: This sheet is a summary. It may not cover all possible information. If you have questions about this medicine, talk to your doctor, pharmacist, or health care provider.  2024 Elsevier/Gold Standard (2022-12-17 00:00:00)   There are well-accepted and sensible ways to reduce risk for Alzheimers disease and other degenerative brain disorders .  Exercise Daily Walk A daily 20 minute walk should be part of your routine. Disease related apathy can be a significant roadblock to exercise and the only way to overcome this is to make it a daily routine and perhaps have a reward at the end (something your loved one loves to eat or drink perhaps) or a personal trainer coming to the home can also be very useful. Most importantly, the patient is much more likely to exercise if the caregiver / spouse does it with him/her. In general a structured, repetitive schedule is best.  General Health: Any  diseases which effect your body will effect your brain such as a pneumonia, urinary infection, blood clot, heart attack or stroke. Keep contact with your primary care doctor for regular follow ups.  Sleep. A good nights sleep is healthy for the brain. Seven hours is recommended. If you have insomnia or poor sleep habits we can give you some instructions. If you have sleep apnea wear your mask.  Diet: Eating a heart healthy diet is also a good idea; fish and poultry instead of red meat, nuts (mostly non-peanuts), vegetables, fruits, olive oil or canola oil (instead of butter), minimal salt (use other spices to flavor foods), whole grain rice, bread, cereal and pasta and wine in moderation.Research is now showing that the MIND diet, which is a combination of The Mediterranean diet and the DASH diet, is beneficial for cognitive processing and longevity. Information about this diet can be found in The MIND Diet, a book by Annitta Feeling, MS, RDN, and online at [employer]Wild[employer].es  Finances, Power of [address] and Advance Directives: You should consider putting legal safeguards in place with regard to financial and medical decision making. While the spouse always has power of attorney for medical and financial issues in the absence of any form, you should consider what you want in case the spouse / caregiver is no longer around or capable of making decisions.   The Alzheimers Association Position on Disease Prevention  Can Alzheimer's be prevented? It's a question that continues to intrigue researchers and fuel new investigations. There are no clear-cut answers yet -- partially due to the need for more large-scale studies in diverse populations -- but promising  research is under way. The Alzheimer's Association is leading the worldwide effort to find a treatment for Alzheimer's, delay its onset and prevent it from developing.   What causes Alzheimer's? Experts agree that in the vast  majority of cases, Alzheimer's, like other common chronic conditions, probably develops as a result of complex interactions among multiple factors, including age, genetics, environment, lifestyle and coexisting medical conditions. Although some risk factors -- such as age or genes -- cannot be changed, other risk factors -- such as high blood pressure and lack of exercise -- usually can be changed to help reduce risk. Research in these areas may lead to new ways to detect those at highest risk.  Prevention studies A small percentage of people with Alzheimer's disease (less than 1 percent) have an early-onset type associated with genetic mutations. Individuals who have these genetic mutations are guaranteed to develop the disease. An ongoing clinical trial conducted by the [facility] ([facility]), is testing whether antibodies to beta-amyloid can reduce the accumulation of beta-amyloid plaque in the brains of people with such genetic mutations and thereby reduce, delay or prevent symptoms. Participants in the trial are receiving antibodies (or placebo) before they develop symptoms, and the development of beta-amyloid plaques is being monitored by brain scans and other tests.  Another clinical trial, known as the A4 trial (Anti-Amyloid Treatment in Asymptomatic Alzheimer's), is testing whether antibodies to beta-amyloid can reduce the risk of Alzheimer's disease in older people (ages 81 to 6) at high risk for the disease. The A4 trial is being conducted by the Alzheimer's Disease Cooperative Study.  Though research is still evolving, evidence is strong that people can reduce their risk by making key lifestyle changes, including participating in regular activity and maintaining good heart health. Based on this research, the Alzheimer's Association offers 10 Ways to Love Your Brain -- a collection of tips that can reduce the risk of cognitive decline.  Heart-head connection  New  research shows there are things we can do to reduce the risk of mild cognitive impairment and dementia.  Several conditions known to increase the risk of cardiovascular disease -- such as high blood pressure, diabetes and high cholesterol -- also increase the risk of developing Alzheimer's. Some autopsy studies show that as many as 80 percent of individuals with Alzheimer's disease also have cardiovascular disease.  A longstanding question is why some people develop hallmark Alzheimer's plaques and tangles but do not develop the symptoms of Alzheimer's. Vascular disease may help researchers eventually find an answer. Some autopsy studies suggest that plaques and tangles may be present in the brain without causing symptoms of cognitive decline unless the brain also shows evidence of vascular disease. More research is needed to better understand the link between vascular health and Alzheimer's.  Physical exercise and diet Regular physical exercise may be a beneficial strategy to lower the risk of Alzheimer's and vascular dementia. Exercise may directly benefit brain cells by increasing blood and oxygen flow in the brain. Because of its known cardiovascular benefits, a medically approved exercise program is a valuable part of any overall wellness plan.  Current evidence suggests that heart-healthy eating may also help protect the brain. Heart-healthy eating includes limiting the intake of sugar and saturated fats and making sure to eat plenty of fruits, vegetables, and whole grains. No one diet is best. Two diets that have been studied and may be beneficial are the DASH (Dietary Approaches to Stop Hypertension) diet and the Mediterranean diet. The DASH  diet emphasizes vegetables, fruits and fat-free or low-fat dairy products; includes whole grains, fish, poultry, beans, seeds, nuts and vegetable oils; and limits sodium, sweets, sugary beverages and red meats. A Mediterranean diet includes relatively little red  meat and emphasizes whole grains, fruits and vegetables, fish and shellfish, and nuts, olive oil and other healthy fats.  Social connections and intellectual activity A number of studies indicate that maintaining strong social connections and keeping mentally active as we age might lower the risk of cognitive decline and Alzheimer's. Experts are not certain about the reason for this association. It may be due to direct mechanisms through which social and mental stimulation strengthen connections between nerve cells in the brain.  Head trauma There appears to be a strong link between future risk of Alzheimer's and serious head trauma, especially when injury involves loss of consciousness. You can help reduce your risk of Alzheimer's by protecting your head.  Wear a seat belt  Use a helmet when participating in sports  Fall-proof your home   What you can do now While research is not yet conclusive, certain lifestyle choices, such as physical activity and diet, may help support brain health and prevent Alzheimer's. Many of these lifestyle changes have been shown to lower the risk of other diseases, like heart disease and diabetes, which have been linked to Alzheimer's. With few drawbacks and plenty of known benefits, healthy lifestyle choices can improve your health and possibly protect your brain.  Learn more about brain health. You can help increase our knowledge by considering participation in a clinical study. Our free clinical trial matching services, TrialMatch, can help you find clinical trials in your area that are seeking volunteers.  Understanding prevention research Here are some things to keep in mind about the research underlying much of our current knowledge about possible prevention:  Insights about potentially modifiable risk factors apply to large population groups, not to individuals. Studies can show that factor X is associated with outcome Y, but cannot guarantee that any  specific person will have that outcome. As a result, you can do everything right and still have a serious health problem or do everything wrong and live to be 100.  Much of our current evidence comes from large epidemiological studies such as the [place] Aging Study, the Nurses' Health Study, the Adult Changes in Thought Study and the [employer]. These studies explore pre-existing behaviors and use statistical methods to relate those behaviors to health outcomes. This type of study can show an association between a factor and an outcome but cannot prove cause and effect. This is why we describe evidence based on these studies with such language as suggests, may show, might protect, and is associated with.  The gold standard for showing cause and effect is a clinical trial in which participants are randomly assigned to a prevention or risk management strategy or a control group. Researchers follow the two groups over time to see if their outcomes differ significantly.  It is unlikely that some prevention or risk management strategies will ever be tested in randomized trials for ethical or practical reasons. One example is exercise. Definitively testing the impact of exercise on Alzheimer's risk would require a huge trial enrolling thousands of people and following them for many years. The expense and logistics of such a trial would be prohibitive, and it would require some people to go without exercise, a known health benefit.       Assessment ; In spite of positive Biomarkers ,  there is little memory impairment. There are forms of AD that only manifest with aphasia and not with visio-spatially , and this patient's STM is excellent, The  Initial MOCA was 22 points, now 29 points. Retesting yearly need to change MOCA version. Dr. Claudis testing was most interesting ;  fairly normal except verb fluency and writing-  Hearing test was fairly normal, but she  has trouble in  noisy environments.  She feels overstimulated.  The patient  does well with number's -she has an accounting degree.  She had a positive PET amyloid scan- I would love to offer there  MAB therapy, but  insurance will not cover it at this time.        1)  I offered Aricept  5 mg daily,  its not a silver bullett and may help with word finding. I called in this medication.    2)  will use an alternative MOCA test in 9 months.    3)  continue ST , word finding is delayed.    4)  Keep regular meal times, hydrate well, exercise daily, keep regular bedtime and rise time, expose yourself to day light- walk in a green space.

## 2024-02-08 ENCOUNTER — Ambulatory Visit (INDEPENDENT_AMBULATORY_CARE_PROVIDER_SITE_OTHER): Admitting: Behavioral Health

## 2024-02-08 DIAGNOSIS — F419 Anxiety disorder, unspecified: Secondary | ICD-10-CM

## 2024-02-08 NOTE — Progress Notes (Signed)
 [facility] Counselor/Therapist Progress Note  Patient ID: Tonya [name], MRN: [id number],    Date: 02/08/2024  Time Spent: 45 min Caregility video; Pt is home in private & Provider working remotely from [employer]. Pt is aware of the risks/limitations of telehealth & consents to Tx today. Time In: 10:00am Time Out: 10:45am  Treatment Type: Individual Therapy  Reported Symptoms: Reduction in anx/dep & marital distress  Mental Status Exam: Appearance:  Casual     Behavior: Appropriate and Sharing  Motor: Normal  Speech/Language:  Clear and Coherent  Affect: Appropriate  Mood: normal  Thought process: normal  Thought content:   WNL  Sensory/Perceptual disturbances:   WNL  Orientation: oriented to person, place, time/date, and situation  Attention: Good  Concentration: Good  Memory: WNL  Fund of knowledge:  Good  Insight:   Good  Judgment:  Good  Impulse Control: Good   Risk Assessment: Danger to Self:  No Self-injurious Behavior: No Danger to Others: No Duty to Warn:no Physical Aggression / Violence:No  Access to Firearms a concern: No  Gang Involvement:No   Subjective: Pt has tried to cope w/her Family on her [name]'s side. She has reconciled her goals for herself & it has been helpful. She has been improved w/her & [name]'s schedule. They are fighting less & agreeing more.   Interventions: Family Systems  Diagnosis:Anxiety  Plan: Tonya [name] is doing better-she has improved her relationship w/her [name]. She feels more positive & [name] is taking his meds, listening, & getting better sleep. They agree on his Tx & this has given him a new lease on life, communication, & dec-making. The relationship has turned for the best. She will cont to foster the relationship in the ways she has given already & she will try to nurture the marriage further as they cont to communicate on this improved level.  Target Date: 02/21/2024  Progress: 7  Frequency: Once every 2-3  wks  Modality: Kennis Richerd Tonya Hollace, LMFT

## 2024-02-09 NOTE — Progress Notes (Signed)
 [facility] Counselor/Therapist Progress Note  Patient ID: Tonya [name], MRN: [id number],    Date: 12/07/2023  Time Spent: 50 min In Person @ [facility] Time In: 1:00pm Time Out: 1:50pm  Visit 2  Treatment Type: Individual Therapy  Reported Symptoms: Elevated anx/dep & stress due to marital dynamics  Mental Status Exam: Appearance:  Casual and Neat     Behavior: Appropriate and Sharing  Motor: Normal  Speech/Language:  Clear and Coherent  Affect: Appropriate  Mood: anxious  Thought process: normal  Thought content:   WNL  Sensory/Perceptual disturbances:   WNL  Orientation: oriented to person, place, time/date, and situation  Attention: Good  Concentration: Good  Memory: Dx of MCI in May 2025  Fund of knowledge:  Good  Insight:   Good  Judgment:  Good  Impulse Control: Good   Risk Assessment: Danger to Self:  No Self-injurious Behavior: No Danger to Others: No Duty to Warn:no Physical Aggression / Violence:No  Access to Firearms a concern: No  Gang Involvement:No   Subjective: Pt explained today how she & [name] were in Cpl Th w/Dr. David [name] in the past for [name]'s 6 yr Affair. Things had started to get Px btwn them. She has become triggered lately by [name]'s beh. He is watching TV until 1-2am in the morning. She is aware of his Family's Hx, & calls them all cheaters.   Pt retired in 2020 after 2 episodes of COVID-19. She is unsure if this was the best decision.  The Cpl's Dtr Tonya [name] dislikes her Dad. She is aware of his beh. She checks on Pt daily & clebrates Holidays & Mother's Day w/her. Pt has been married to [facility] for 23 yrs. She has known him for 11yrs & loves him. He lacks Family support & has few friends. Pt's hopefulness keeps her in the marriage. We discussed [name]'s strengths. Pt misses sleeping in the marital bed. [name] has hinted they might do this again. He has 2 Dtrs & ea relationship is different. One Dtr will not speak w/him &  the other is routine wkly & monthly calls. [name] tells Pt he loves her & she sts she feels this @ times.   Interventions: Family Systems and Interpersonal  Diagnosis:Anxiety  Anxiety about health  Plan: Tonya [name] appreciates having a place to discuss her feelings & help ease her anxiety. She wants her marriage to work & realizes it can begin w/her by changes in beh on her part. She wants more confidence in herself & we will work on this tgthr. We discussed the self-care activities Tonya [name] can do for herself:  -Px exer  -low fat, fruit-rich diet  -Omega-3 fatty acids  -brain activities; puzzles, jigsaw & flat, scrabble, Sudoko, card games, checkers, word search, crossword puzzles  Also, she can stabilize her own mental health by:  -set healthy boundaries  -maintain her mental health support & the support of friends  -practice self-care  -avoid being an emot'l crutch for [name]  -stay neutral about his Dtr's neg relationship, don't take sides-remain neutral  -recognize & acknowledge your feelings & care for them Tonya [name] will try to tend to these things & improve her own attn to her personal/Px & mental needs using these guidelines. We will check on these efforts every session & discuss her progress.  Target Date: 01/04/2024  Progress: 6  Frequency: Once every 2-3 wks  Modality: Tonya Richerd LITTIE Hollace, LMFT

## 2024-02-18 ENCOUNTER — Ambulatory Visit: Payer: PPO | Admitting: Cardiology

## 2024-02-22 ENCOUNTER — Encounter (HOSPITAL_BASED_OUTPATIENT_CLINIC_OR_DEPARTMENT_OTHER): Payer: Self-pay

## 2024-02-23 ENCOUNTER — Encounter: Payer: Self-pay | Admitting: Cardiology

## 2024-02-23 ENCOUNTER — Telehealth: Payer: Self-pay | Admitting: Neurology

## 2024-02-23 ENCOUNTER — Telehealth: Payer: Self-pay

## 2024-02-23 ENCOUNTER — Ambulatory Visit
Admission: RE | Admit: 2024-02-23 | Discharge: 2024-02-23 | Disposition: A | Discharge status: Home / Self Care | Source: Ambulatory Visit | Attending: Internal Medicine | Admitting: Internal Medicine

## 2024-02-23 ENCOUNTER — Ambulatory Visit: Admitting: Cardiology

## 2024-02-23 VITALS — BP 122/68 | HR 65 | Resp 16 | Ht 67.0 in | Wt 202.4 lb

## 2024-02-23 DIAGNOSIS — E7800 Pure hypercholesterolemia, unspecified: Secondary | ICD-10-CM | POA: Insufficient documentation

## 2024-02-23 DIAGNOSIS — I2510 Atherosclerotic heart disease of native coronary artery without angina pectoris: Secondary | ICD-10-CM | POA: Diagnosis not present

## 2024-02-23 DIAGNOSIS — I2583 Coronary atherosclerosis due to lipid rich plaque: Secondary | ICD-10-CM | POA: Diagnosis not present

## 2024-02-23 DIAGNOSIS — I10 Essential (primary) hypertension: Secondary | ICD-10-CM | POA: Insufficient documentation

## 2024-02-23 DIAGNOSIS — G309 Alzheimer's disease, unspecified: Secondary | ICD-10-CM

## 2024-02-23 DIAGNOSIS — I361 Nonrheumatic tricuspid (valve) insufficiency: Secondary | ICD-10-CM | POA: Diagnosis not present

## 2024-02-23 DIAGNOSIS — G3184 Mild cognitive impairment, so stated: Secondary | ICD-10-CM

## 2024-02-23 LAB — ECHOCARDIOGRAM COMPLETE
AR max vel: 2.68 cm2
AV Area VTI: 2.44 cm2
AV Area mean vel: 2.53 cm2
AV Mean grad: 3 mmHg
AV Peak grad: 5 mmHg
Ao pk vel: 1.12 m/s
Area-P 1/2: 3.12 cm2
S' Lateral: 3.1 cm

## 2024-02-23 MED ORDER — EZETIMIBE 10 MG PO TABS: 10.0000 mg | ORAL_TABLET | Freq: Every day | ORAL | 3 refills | Status: AC

## 2024-02-23 MED ORDER — ATENOLOL 25 MG PO TABS: 25.0000 mg | ORAL_TABLET | Freq: Every day | ORAL | 3 refills | Status: DC

## 2024-02-23 NOTE — Telephone Encounter (Signed)
 Pt reports she is being told that another referral is needed to [facility] for her Speech Pathology. Pt reports her Speech Pathologist is Nathaneil Million her coordinator is [place] at [phone]

## 2024-02-23 NOTE — Patient Instructions (Signed)
 Medication Instructions:   DECREASE ATENOLOL  TO 25 MG ONCE DAILY= 1/2 OF THE 50 MG TABLET ONCE DAILY  START EZETIMIBE  10 MG ONCE DAILY  *If you need a refill on your cardiac medications before your next appointment, please call your pharmacy*  Lab Work:  Your physician recommends that you return for lab work in: 6 WEEKS-FASTING WITH US  OR PCP  If you have labs (blood work) drawn today and your tests are completely normal, you will receive your results only by: MyChart Message (if you have MyChart) OR A paper copy in the mail If you have any lab test that is abnormal or we need to change your treatment, we will call you to review the results.  Follow-Up: At [facility], you and your health needs are our priority.  As part of our continuing mission to provide you with exceptional heart care, our providers are all part of one team.  This team includes your primary Cardiologist (physician) and Advanced Practice Providers or APPs (Physician Assistants and Nurse Practitioners) who all work together to provide you with the care you need, when you need it.  Your next appointment:   12 month(s)  Provider:    SUNIT TOLIA DO

## 2024-02-23 NOTE — Telephone Encounter (Signed)
 Pt cld regarding referral for Speech needed to be sent to [facility] again. Cld number Pt provided for Courtney. Spoke w/Courtney regarding referral. Charmaine stated she is with [facility] and the last neuropsych referral they have on file for Pt was from 03/18/23 and will expire before Pt next eval visit in December. They need the referral sent for Pt repeat neuro testing. Please fax to Atten: Naomi at [phone].

## 2024-02-23 NOTE — Progress Notes (Signed)
 Cardiology Office Note:  .   Date:  02/23/2024  ID:  Tonya [name], DOB 06/03/1951, MRN [id number] PCP:  Frann Mabel Mt, DO  Former Cardiology Providers: Dr. Woodrow ([facility]), Dr. Lonni Nanas (04/2021)  [facility] Providers Cardiologist:  Tonya Large, DO , [facility] (established care 10/20/2021) Electrophysiologist:  None  Click to update primary MD,subspecialty MD or APP then REFRESH:1}    Chief Complaint  Patient presents with   Coronary atherosclerosis due to lipid rich plaque   Follow-up    History of Present Illness: .   Tonya [name] is a 73 y.o. Caucasian female whose past medical history and cardiovascular risk factors includes: Hypertension, mild nonobstructive CAD (coronary CTA 01/2021), diastolic dysfunction, family history of heart disease, osteopenia, Hx of breast cancer, postmenopausal female, advanced age.   Patient underwent coronary CT in the past which noted mild nonobstructive coronary disease with plaque in the proximal LAD total calcium  score 9.2 placing her at the 50th percentile.  She has been on pharmacological therapy for secondary prevention.  She did not tolerate atorvastatin  or Crestor  in the past due to myalgias and headaches.  She was later referred to lipid clinic for possible PCSK9 inhibitors.  When she transferred her care to [facility] cardiovascular she was started on Zetia  10 mg p.o. daily as well as pravastatin .  Her lipids did improve with lower intensity statin therapy.  However, pravastatin  was later discontinued also due to myalgias.  She presents today for 1 year follow-up visit.  Denies anginal chest pain. Has shortness of breath predominantly with effort related activities which is chronic and stable. She also complains of feeling tired and fatigued.  She did undergo sleep study with Dr. Chalice and noted to have mild sleep apnea not requiring CPAP. Overall function capacity is limited due to hip pain. Unfortunately,  patient was diagnosed with Alzheimer's disease since last office visit.  Patient was prescribed Aricept  approximately 1.5 weeks ago but has not taken it due to side effect profile.  We discussed in great detail and also mention the risk-benefit ratio.   Review of Systems: .   Review of Systems  Constitutional: Positive for malaise/fatigue.  Cardiovascular:  Positive for dyspnea on exertion (chronic and stable). Negative for chest pain, claudication, irregular heartbeat, leg swelling, near-syncope, orthopnea, palpitations, paroxysmal nocturnal dyspnea and syncope.  Respiratory:  Negative for shortness of breath.   Hematologic/Lymphatic: Negative for bleeding problem.    Studies Reviewed:   EKG: EKG Interpretation Date/Time:  Wednesday February 23 2024 10:02:39 EDT Ventricular Rate:  60 PR Interval:  156 QRS Duration:  102 QT Interval:  400 QTC Calculation: 400 R Axis:   41  Text Interpretation: Normal sinus rhythm Normal ECG No previous ECGs available Confirmed by [name] [phone]) on 02/23/2024 10:31:52 AM  Echocardiogram: 02/23/2024 Reviewed - final results pending  LVEF is preserved, mild MR  RADIOLOGY: NA  Risk Assessment/Calculations:   NA   Labs:       Latest Ref Rng & Units 08/17/2023    2:32 PM 02/03/2023    2:11 PM 05/08/2022   12:00 PM  CBC  WBC 4.0 - 10.5 K/uL 9.1  9.3  10.7   Hemoglobin 12.0 - 15.0 g/dL 86.8  86.2  85.9   Hematocrit 36.0 - 46.0 % 39.7  42.4  42.2   Platelets 150 - 400 K/uL 204  206.0  177.0        Latest Ref Rng & Units 08/17/2023    2:32 PM 07/16/2023  11:48 AM 02/03/2023    2:11 PM  BMP  Glucose 70 - 99 mg/dL 899  84  897   BUN 8 - 23 mg/dL 17  16  16    Creatinine 0.44 - 1.00 mg/dL 9.23  9.17  9.16   BUN/Creat Ratio 12 - 28  20    Sodium 135 - 145 mmol/L 137  143  138   Potassium 3.5 - 5.1 mmol/L 4.0  4.3  3.9   Chloride 98 - 111 mmol/L 101  104  102   CO2 22 - 32 mmol/L 26  24  28    Calcium  8.9 - 10.3 mg/dL 9.3  9.6  9.7        Latest Ref Rng & Units 08/17/2023    2:32 PM 07/16/2023   11:48 AM 02/03/2023    2:11 PM  CMP  Glucose 70 - 99 mg/dL 899  84  897   BUN 8 - 23 mg/dL 17  16  16    Creatinine 0.44 - 1.00 mg/dL 9.23  9.17  9.16   Sodium 135 - 145 mmol/L 137  143  138   Potassium 3.5 - 5.1 mmol/L 4.0  4.3  3.9   Chloride 98 - 111 mmol/L 101  104  102   CO2 22 - 32 mmol/L 26  24  28    Calcium  8.9 - 10.3 mg/dL 9.3  9.6  9.7   Total Protein 6.5 - 8.1 g/dL 7.4  6.8  7.2   Total Bilirubin 0.0 - 1.2 mg/dL 1.0  0.4  0.7   Alkaline Phos 38 - 126 U/L 70  88  75   AST 15 - 41 U/L 26  24  21    ALT 0 - 44 U/L 21  18  15      Lab Results  Component Value Date   CHOL 179 03/02/2023   HDL 67 03/02/2023   LDLCALC 94 03/02/2023   LDLDIRECT 99 03/02/2023   TRIG 101 03/02/2023   CHOLHDL 2 06/19/2022   No results for input(s): LIPOA in the last 8760 hours. No components found for: NTPROBNP No results for input(s): PROBNP in the last 8760 hours. No results for input(s): TSH in the last 8760 hours.  Physical Exam:    Today's Vitals   02/23/24 0959  BP: 122/68  Pulse: 65  Resp: 16  SpO2: 93%  Weight: 202 lb 6.4 oz (91.8 kg)  Height: 5' 7 (1.702 m)   Body mass index is 31.7 kg/m. Wt Readings from Last 3 Encounters:  02/23/24 202 lb 6.4 oz (91.8 kg)  02/07/24 202 lb 6.4 oz (91.8 kg)  11/10/23 204 lb (92.5 kg)    Physical Exam  Constitutional: No distress.  hemodynamically stable  Neck: No JVD present.  Cardiovascular: Normal rate, regular rhythm, S1 normal and S2 normal. Exam reveals no gallop, no S3 and no S4.  No murmur heard. Pulmonary/Chest: Effort normal and breath sounds normal. No stridor. She has no wheezes. She has no rales.  Musculoskeletal:        General: No edema.     Cervical back: Neck supple.  Skin: Skin is warm.     Impression & Recommendation(s):  Impression:   ICD-10-CM   1. Coronary atherosclerosis due to lipid rich plaque  I25.83 EKG 12-Lead    2. Essential  hypertension  I10 Comprehensive Metabolic Panel (CMET)    3. Pure hypercholesterolemia  E78.00 ezetimibe  (ZETIA ) 10 MG tablet    Lipid panel    Comprehensive Metabolic  Panel (CMET)       Recommendation(s):  Coronary atherosclerosis due to lipid rich plaque EKG nonischemic. Continue aspirin  81 mg p.o. daily. Continue lipid-lowering agents. Echocardiogram notes preserved LVEF no obvious regional wall motion normalities or significant valvular heart disease. Reemphasized the importance of secondary prevention with focus on improving the modifiable cardiovascular risk factors such as glycemic control, lipid management, blood pressure control, weight loss.  Essential hypertension Office blood pressures are very well-controlled. Given her symptoms of feeling tired and fatigue and also will be starting Aricept  will decrease atenolol  from 50 mg p.o. daily to 25 mg p.o. daily to avoid symptomatic bradycardia. Continue losartan  50 mg p.o. every morning  Pure hypercholesterolemia/statin intolerance Currently on lovastatin  10 mg p.o. daily.   For reasons unknown patient stopped taking Zetia . Will refill Zetia  10 mg p.o. daily. In 6 weeks patient is recommended to have fasting lipids.  This can be done with her PCP as part of her yearly well visit or she can call the office and we can schedule it at that time if she has no plans of getting labs with PCP LDL 139 mg/dL (89/7980), 898 mg/dL (February 7976), 74 mg/dL (June 7976), 99 mg/dL (September 7975) Intolerant to Lipitor and Crestor  due to headaches and nausea. Intolerant to pravastatin  due to myalgias  Orders Placed:  Orders Placed This Encounter  Procedures   Lipid panel   Comprehensive Metabolic Panel (CMET)   EKG 12-Lead     Final Medication List:    Meds ordered this encounter  Medications   atenolol  (TENORMIN ) 25 MG tablet    Sig: Take 1 tablet (25 mg total) by mouth daily.    Dispense:  90 tablet    Refill:  3   ezetimibe   (ZETIA ) 10 MG tablet    Sig: Take 1 tablet (10 mg total) by mouth daily.    Dispense:  90 tablet    Refill:  3    Medications Discontinued During This Encounter  Medication Reason   atenolol  (TENORMIN ) 50 MG tablet      Current Outpatient Medications:    ezetimibe  (ZETIA ) 10 MG tablet, Take 1 tablet (10 mg total) by mouth daily., Disp: 90 tablet, Rfl: 3   aspirin  EC (ASPIRIN  LOW DOSE) 81 MG tablet, TAKE 1 TABLET (81 MG TOTAL) BY MOUTH DAILY. SWALLOW WHOLE., Disp: 90 tablet, Rfl: 3   atenolol  (TENORMIN ) 25 MG tablet, Take 1 tablet (25 mg total) by mouth daily., Disp: 90 tablet, Rfl: 3   calcium -vitamin D (OSCAL WITH D) 250-125 MG-UNIT tablet, Take 1 tablet by mouth daily., Disp: , Rfl:    donepezil  (ARICEPT ) 5 MG tablet, Take 1 tablet (5 mg total) by mouth at bedtime., Disp: 30 tablet, Rfl: 9   losartan  (COZAAR ) 50 MG tablet, TAKE 1 TABLET BY MOUTH EVERY DAY, Disp: 90 tablet, Rfl: 1   lovastatin  (MEVACOR ) 10 MG tablet, Take 1 tablet (10 mg total) by mouth at bedtime., Disp: 90 tablet, Rfl: 1   montelukast  (SINGULAIR ) 10 MG tablet, TAKE 1 TABLET BY MOUTH EVERYDAY AT BEDTIME, Disp: 90 tablet, Rfl: 3   Multiple Vitamins-Minerals (MULTIVITAMIN ADULTS 50+) TABS, Take by mouth., Disp: , Rfl:    pantoprazole  (PROTONIX ) 20 MG tablet, Take 1 tablet (20 mg total) by mouth daily., Disp: 90 tablet, Rfl: 0  Consent:   NA  Disposition:   1 year follow-up sooner if needed  Her questions and concerns were addressed to her satisfaction. She voices understanding of the recommendations provided during this encounter.  Signed, Tonya Michele HAS, [facility]  A Division of [facility] [address] [place], [place] [address]  02/23/2024 11:26 AM

## 2024-02-23 NOTE — Telephone Encounter (Signed)
 Orders were placed by Cardiology today Last lipid panel 02/2023 CMP 07/2023  Copied from CRM #[id number]. Topic: General - Other >> Feb 23, 2024  2:46 PM Tonya [name] wrote: Reason for CRM: Patient would like a call to let her know when was the last time she had her cholesterol checked? She seen her cardiologist Dr Michele today and they were wanting to know,he also wanted her to have labs done in 6 weeks for lipid panel and CMET done. Will PCP be able to place these orders for patient?

## 2024-02-24 ENCOUNTER — Ambulatory Visit: Admitting: Behavioral Health

## 2024-02-24 ENCOUNTER — Telehealth: Payer: Self-pay | Admitting: Neurology

## 2024-02-24 DIAGNOSIS — F419 Anxiety disorder, unspecified: Secondary | ICD-10-CM | POA: Diagnosis not present

## 2024-02-24 DIAGNOSIS — R4589 Other symptoms and signs involving emotional state: Secondary | ICD-10-CM | POA: Diagnosis not present

## 2024-02-24 NOTE — Telephone Encounter (Signed)
 Referral for neuropsychology fax to [facility] attention to [facility] as requested by neurolologist. Phone: [phone], Fax: [phone]

## 2024-02-24 NOTE — Progress Notes (Signed)
 [facility] Counselor/Therapist Progress Note  Patient ID: Tonya [name], MRN: [id number],    Date: 02/24/2024  Time Spent: 50 min Caregility video; Pt is home in private & Provider working remotely from [employer]. Pt is aware of the risks/limitations of telehealth & consents to Tx today. Time In: 10:00am Time Out: 10:50am   Treatment Type: Individual Therapy  Reported Symptoms: Elevated anx/dep due to MCI & [name] Todd's improvements due to his medication changes.   Mental Status Exam: Appearance:  Casual     Behavior: Appropriate and Sharing  Motor: Normal  Speech/Language:  Clear and Coherent  Affect: Appropriate  Mood: normal  Thought process: normal  Thought content:   WNL  Sensory/Perceptual disturbances:   WNL  Orientation: oriented to person, place, time/date, and situation  Attention: Good  Concentration: Good  Memory: Dx of MCI  Fund of knowledge:  Good  Insight:   Good  Judgment:  Good  Impulse Control: Good   Risk Assessment: Danger to Self:  No Self-injurious Behavior: No Danger to Others: No Duty to Warn:no Physical Aggression / Violence:No  Access to Firearms a concern: No  Gang Involvement:No   Subjective: Pt is anxious about her [name]'s health. They are not intimate currently & sleep separate. He will not sleep in the marital bed w/Pt.  He has a Hx of health issues.    Pt is highly anxious about [name]'s triggering beh since he had an affair 6+ yrs.   Interventions: Psycho-education/Bibliotherapy and Interpersonal  Diagnosis:Anxiety  Anxiety about health  Plan: Tonya [name] will consider keeping a Notebook to record her feelings, anxieties, & thoughts btwn sessions to help her process ideas. She will speak up more w/[name] & protect her boundaries better bc it helps the relationship w/him thrive. She will maintain her concerns about his health & help him manage these as is indicated. She loves him & wants the marriage to improve.  Target Date:  03/21/2024  Progress: 6  Frequency: Once every 2-3 wks  Modality: [name], LMFT

## 2024-02-26 ENCOUNTER — Ambulatory Visit: Payer: Self-pay | Admitting: Cardiology

## 2024-03-09 ENCOUNTER — Ambulatory Visit (INDEPENDENT_AMBULATORY_CARE_PROVIDER_SITE_OTHER): Admitting: Behavioral Health

## 2024-03-09 DIAGNOSIS — F419 Anxiety disorder, unspecified: Secondary | ICD-10-CM | POA: Diagnosis not present

## 2024-03-09 NOTE — Progress Notes (Signed)
 [facility] Counselor/Therapist Progress Note  Patient ID: Tonya [name], MRN: [id number],    Date: 03/09/2024  Time Spent:  50 min Caregility video; Pt is home in private & Provider working remotely from [employer]. Pt is aware of the risks/limitations of telehealth & consents to Tx today. Time In: 10:00am Time Out: 10:50am   Treatment Type: Individual Therapy  Reported Symptoms: Elevated anx/dep & stressors w/in the marital relationship  Mental Status Exam: Appearance:  Casual & neat  Behavior: Appropriate and Sharing  Motor: Normal  Speech/Language:  Clear and Coherent  Affect: Appropriate  Mood: anxious  Thought process: normal  Thought content:   WNL  Sensory/Perceptual disturbances:   WNL  Orientation: oriented to person, place, time/date, and situation  Attention: Good  Concentration: Good  Memory: WNL; May 2025 Dx of MCI  Fund of knowledge:  Good  Insight:   Good  Judgment:  Good  Impulse Control: Good   Risk Assessment: Danger to Self:  No Self-injurious Behavior: No Danger to Others: No Duty to Warn:no Physical Aggression / Violence:No  Access to Firearms a concern: No  Gang Involvement:No   Subjective: Pt is anxious today dealing w/her own health issues & those of her [name].   Pt is doing projects in her home that involve renovations. The Landsc[employer] is coming on Friday to make plans for the yard. Cpl has been updating the home for the past 2 yrs.   Pt reviewing her past Hx w/former [name] who had an Ex-Wife that fabricated stories about Pt that were untrue. Pt endures this until the present time. Pt spreads much goodwill w/others. Her childhood was filled w/poverty & this has informed the rest of her life. She is happy today about the way her childhood has made her life a good place to live. Pt loves her [name] & wants the best for her Family.   Interventions: Insight-Oriented and Interpersonal  Diagnosis:Anxiety  Plan: [name] loves to spend  time w/her Family; Grandchildren & Dtr Tonya [name] are always welcome. She will cont to use her Notebook to keep record of her present circumstances.  Target Date:04/05/2024  Progress:7  Frequency: Once every 2-3 wks  Modality: [name], LMFT

## 2024-03-13 ENCOUNTER — Encounter: Payer: Self-pay | Admitting: Family Medicine

## 2024-03-13 ENCOUNTER — Ambulatory Visit: Admitting: Family Medicine

## 2024-03-13 VITALS — BP 122/78 | HR 75 | Temp 97.9°F | Resp 18 | Ht 67.0 in | Wt 198.0 lb

## 2024-03-13 DIAGNOSIS — E7800 Pure hypercholesterolemia, unspecified: Secondary | ICD-10-CM | POA: Diagnosis not present

## 2024-03-13 DIAGNOSIS — G309 Alzheimer's disease, unspecified: Secondary | ICD-10-CM | POA: Diagnosis not present

## 2024-03-13 NOTE — Progress Notes (Signed)
 Chief Complaint  Patient presents with   Medication Reaction    Subjective: Patient is a 73 y.o. female here for med concern.  She is here with her spouse who helps with the history.  The patient has a history of Alzheimer's dementia.  She follows with the neurology team for this.  She was recently prescribed Aricept  5 mg nightly.  She was compliant with this.  Since starting a few weeks ago, she has noticed increased myalgias at night, loose stools, and difficulty sleeping.  She saw her cardiologist not long after and in addition to her lovastatin  10 mg daily, he placed her back on Zetia  10 mg daily.  Because of the myalgias, she has stopped the latter medication.  She is still having difficulty sleeping and having loose stools.  Myalgias are slightly better but still there.  No injury or change in activity.  She could do better with staying hydrated.  Denies any new chest pain or shortness of breath.  Past Medical History:  Diagnosis Date   Allergy June 2012   Seasonal   Asthma 2015   Cancer [facility]) 2010   Left Breast   Coronary atherosclerosis due to calcified coronary lesion    GERD (gastroesophageal reflux disease)    Many years ago   History of breast cancer    Hyperlipidemia    Hypertension    Osteopenia     Objective: BP 122/78 (BP Location: Right Arm, Cuff Size: Normal)   Pulse 75   Temp 97.9 F (36.6 C)   Resp 18   Ht 5' 7 (1.702 m)   Wt 198 lb (89.8 kg)   SpO2 98%   BMI 31.01 kg/m  General: Awake, appears stated age Lungs: No accessory muscle use Heart: RRR Neuro: Speech is fluent, goal oriented; some word finding difficulty Psych: Age appropriate judgment and insight, normal affect and mood  Assessment and Plan: Pure hypercholesterolemia - Plan: Comprehensive metabolic panel with GFR, Lipid panel  Alzheimer's disease, unspecified (CODE) (HCC)  She will go back on the Zetia .  I will get a baseline cholesterol today.  Depending on the levels, we may need to  recheck her LDL in 6 more weeks as she has not been taking this new medication (Zetia ) consistently.  Stay on the lovastatin  10 mg daily.  Stay hydrated.  Counseled on diet and exercise. Adverse effect of chronic medication.  Stop Aricept .  I will reach out to Dr. Chalice and update her on the situation.  The patient and her spouse voiced understanding and agreement to the plan.  I spent 40 minutes with the patient and her spouse discussing the above plans in addition to reviewing her chart and coordinating care on the same day of the visit.  Mabel Mt [place], DO 03/13/24  4:49 PM

## 2024-03-13 NOTE — Patient Instructions (Signed)
 Give us  2-3 business days to get the results of your labs back.   Keep the diet clean and stay active.  Please stop the Aricept /donepezil . I will inform Dr. JONETTA.   Let us  know if you need anything.

## 2024-03-14 ENCOUNTER — Ambulatory Visit: Payer: Self-pay | Admitting: Family Medicine

## 2024-03-14 LAB — COMPREHENSIVE METABOLIC PANEL WITH GFR
ALT: 17 U/L (ref 0–35)
AST: 23 U/L (ref 0–37)
Albumin: 4.6 g/dL (ref 3.5–5.2)
Alkaline Phosphatase: 66 U/L (ref 39–117)
BUN: 14 mg/dL (ref 6–23)
CO2: 31 meq/L (ref 19–32)
Calcium: 9.9 mg/dL (ref 8.4–10.5)
Chloride: 102 meq/L (ref 96–112)
Creatinine, Ser: 0.81 mg/dL (ref 0.40–1.20)
GFR: 72.64 mL/min (ref >60.00)
Glucose, Bld: 77 mg/dL (ref 70–99)
Potassium: 4.4 meq/L (ref 3.5–5.1)
Sodium: 141 meq/L (ref 135–145)
Total Bilirubin: 1 mg/dL (ref 0.2–1.2)
Total Protein: 7 g/dL (ref 6.0–8.3)

## 2024-03-14 LAB — LIPID PANEL
Cholesterol: 148 mg/dL (ref 0–200)
HDL: 61.9 mg/dL (ref >39.00)
LDL Cholesterol: 69 mg/dL (ref 0–99)
NonHDL: 85.82
Total CHOL/HDL Ratio: 2
Triglycerides: 82 mg/dL (ref 0.0–149.0)
VLDL: 16.4 mg/dL (ref 0.0–40.0)

## 2024-03-15 ENCOUNTER — Telehealth: Payer: Self-pay | Admitting: Neurology

## 2024-03-15 NOTE — Telephone Encounter (Signed)
 Pt is asking for a call from RN to discuss a form that is being sent to the specialist she was referred to.  Pt states the wrong form is being sent to the office she was referred to , and this is preventing the specialist from being paid, please call pt.

## 2024-03-15 NOTE — Telephone Encounter (Signed)
 Spoke w/Pt regarding form requested by [employer]. Pt stated her ins co told her to call the office of referring provider as an out-of-network form is needed for ST in order for insurance to cover the ST visits. Informed Pt will need to reach out to our billing dept as this is not something we handle in the clinic. Pt voiced understanding and asked if she can get a call back letting her know if it was handled or if there is something else she needs to do. Informed Pt will ask that she be notified regarding the request. Spoke w/billing dept mgr who stated insurance company needs to send the form they need completed as we do not have those on file.  Cld Pt to make her aware of what billing dept mgr stated and gave Pt fax # for form to be sent. Pt voiced understanding and thanks.

## 2024-03-16 ENCOUNTER — Ambulatory Visit (INDEPENDENT_AMBULATORY_CARE_PROVIDER_SITE_OTHER): Admitting: Family Medicine

## 2024-03-16 VITALS — BP 115/61 | HR 69 | Ht 67.0 in | Wt 197.0 lb

## 2024-03-16 DIAGNOSIS — R238 Other skin changes: Secondary | ICD-10-CM

## 2024-03-16 MED ORDER — VALACYCLOVIR HCL 1 G PO TABS: 1000.0000 mg | ORAL_TABLET | Freq: Two times a day (BID) | ORAL | 3 refills | Status: DC

## 2024-03-16 NOTE — Progress Notes (Signed)
   Subjective:    Patient ID: Tonya [name], female    DOB: 1951/01/12, 73 y.o.   MRN: [id number]  HPI Seen for cluster of bumps in her medial left thigh. Started on Monday with one bump, now has several. Initially had some burning.    Review of Systems     Objective:   Physical Exam Vitals reviewed.  Constitutional:      Appearance: Normal appearance.  Genitourinary:  Skin:    General: Skin is warm.     Capillary Refill: Capillary refill takes less than 2 seconds.  Neurological:     General: No focal deficit present.     Mental Status: She is alert.  Psychiatric:        Mood and Affect: Mood normal.        Behavior: Behavior normal.        Thought Content: Thought content normal.        Judgment: Judgment normal.        Assessment & Plan:  1. Vesicular eruption of skin (Primary) Concerning for HSV type infection. Start valtrex . HSV culture obtained

## 2024-03-16 NOTE — Addendum Note (Signed)
 Addended by: JOMARIE SKIPPER D on: 03/16/2024 09:42 AM   Modules accepted: Orders

## 2024-03-20 LAB — HERPES SIMPLEX VIRUS CULTURE

## 2024-03-22 ENCOUNTER — Other Ambulatory Visit: Payer: Self-pay | Admitting: Family Medicine

## 2024-03-22 ENCOUNTER — Telehealth: Payer: Self-pay | Admitting: Neurology

## 2024-03-22 ENCOUNTER — Ambulatory Visit: Payer: Self-pay | Admitting: Family Medicine

## 2024-03-22 MED ORDER — VALACYCLOVIR HCL 500 MG PO TABS: 500.0000 mg | ORAL_TABLET | Freq: Every day | ORAL | 3 refills | Status: AC

## 2024-03-22 NOTE — Telephone Encounter (Signed)
 Called patient and discussed results. Lesions improving. Would like to be on PPX. Sent to the pharmacy.

## 2024-03-22 NOTE — Telephone Encounter (Signed)
 Patient declined Namenda.

## 2024-03-22 NOTE — Telephone Encounter (Signed)
-----   Message from Mabel Mt [place] sent at 03/20/2024  2:46 PM EDT ----- Regarding: Namenda Dr. Chalice- Our office reached out and the pt does not wish to start this medicine. Thanks for your help and for caring for her. Karleen

## 2024-03-24 NOTE — Telephone Encounter (Signed)
 In the last office visit patient was endorsing feeling tired and fatigued. Her pulse was around 60 bpm. She was planning to start Aricept .   Therefore I will reduce the dose of atenolol  to 25 mg p.o. daily given her symptoms and to avoid further bradycardia.  We can put in a prescription for atenolol  50 mg p.o. daily if she does not plan to be on Aricept  (back to original dose).   In the past atenolol  was managed by PCP and they can fill in the future unless if there is a concern.  Kristyanna Barcelo [place], DO, FACC

## 2024-03-25 ENCOUNTER — Other Ambulatory Visit: Payer: Self-pay | Admitting: Family Medicine

## 2024-03-27 MED ORDER — ATENOLOL 50 MG PO TABS: 25.0000 mg | ORAL_TABLET | Freq: Every day | ORAL | 3 refills | Status: AC

## 2024-03-29 ENCOUNTER — Ambulatory Visit (INDEPENDENT_AMBULATORY_CARE_PROVIDER_SITE_OTHER): Admitting: Behavioral Health

## 2024-03-29 DIAGNOSIS — Z630 Problems in relationship with spouse or partner: Secondary | ICD-10-CM

## 2024-03-29 DIAGNOSIS — F419 Anxiety disorder, unspecified: Secondary | ICD-10-CM

## 2024-03-29 NOTE — Progress Notes (Unsigned)
 [facility] Counselor/Therapist Progress Note  Patient ID: Tonya [name], MRN: [id number],    Date: 03/29/2024  Time Spent: 50 min Caregility video; Pt is in Home Office w/privacy & Provider working remotely from [employer]. Pt is aware of the risks/limitations of telehealth & consents to Tx today.  Time In: 1:00pm Time Out: 1:50pm   Treatment Type: Individual Therapy  Reported Symptoms: Inc in anx/dep & stress due to [name]'s lack of Tx by his Psychologist over the past month or so & the observations she has made about his mental health status changes.   Mental Status Exam: Appearance:  Casual     Behavior: Appropriate and Sharing  Motor: Normal  Speech/Language:  Clear and Coherent  Affect: Appropriate  Mood: normal  Thought process: normal; Dx of MCI  Thought content:   WNL  Sensory/Perceptual disturbances:   WNL  Orientation: oriented to person, place, time/date, and situation  Attention: Good  Concentration: Good  Memory: WNL  Fund of knowledge:  Good  Insight:   Good  Judgment:  Good  Impulse Control: Good   Risk Assessment: Danger to Self:  No Self-injurious Behavior: No Danger to Others: No Duty to Warn:no Physical Aggression / Violence:No  Access to Firearms a concern: No  Gang Involvement:No   Subjective: Pt describes the difficulty w/the marital relationship. She is unhappy w/the situation lately. She is lonely, her [name] has not shown afection, & she is lft to her own devices much of the time.   Interventions: Family Systems  Diagnosis:Anxiety  Marital stress  Plan: Tonya [name] will encourage her [name] Tonya [name] to   Target Date: 04/20/2024  Progress: 5  Frequency: Once every 2-3 wks  Modality: Tonya Richerd LITTIE Hollace, LMFT

## 2024-04-12 ENCOUNTER — Ambulatory Visit (INDEPENDENT_AMBULATORY_CARE_PROVIDER_SITE_OTHER): Admitting: Behavioral Health

## 2024-04-12 DIAGNOSIS — Z630 Problems in relationship with spouse or partner: Secondary | ICD-10-CM | POA: Diagnosis not present

## 2024-04-12 DIAGNOSIS — R4589 Other symptoms and signs involving emotional state: Secondary | ICD-10-CM

## 2024-04-12 DIAGNOSIS — F419 Anxiety disorder, unspecified: Secondary | ICD-10-CM

## 2024-04-12 DIAGNOSIS — F418 Other specified anxiety disorders: Secondary | ICD-10-CM | POA: Diagnosis not present

## 2024-04-12 NOTE — Progress Notes (Signed)
 [facility] Counselor/Therapist Progress Note  Patient ID: Tonya [name], MRN: [id number],    Date: 04/12/2024  Time Spent: 50 min Caregility video; Pt is in Office w/privacy & Provider working remotely from [employer]. Pt aware of the risks/limitations of telehealth & consents to Tx today.  Time In: 9:00am Time Out: 9:50am   Treatment Type: Individual Therapy  Reported Symptoms: Elevated anx/dep & stress   Mental Status Exam: Appearance:  Casual     Behavior: Appropriate and Sharing  Motor: Normal  Speech/Language:  Clear and Coherent  Affect: Appropriate  Mood: anxious  Thought process: normal  Thought content:   WNL  Sensory/Perceptual disturbances:   WNL  Orientation: oriented to person, place, time/date, and situation  Attention: Good  Concentration: Good  Memory: WNL  Fund of knowledge:  Good  Insight:   Good  Judgment:  Good  Impulse Control: Good   Risk Assessment: Danger to Self:  No Self-injurious Behavior: No Danger to Others: No Duty to Warn:no Physical Aggression / Violence:No  Access to Firearms a concern: No  Gang Involvement:No   Subjective: Pt is worried for her GC hildren due to designation of God Parents who are not desirable.  Her Dtr is in a marriage that is less functional bc her [name] is untrustworthy. He travels alone when Dtr [name] is travelling. The paternal Family dynamics are quite difficult. She is questioning her Dtr's relationship to the point she wishes to change her & [name]'s Trust. It currently bestows their possessions to her Dtr [place]. She does not think this is any longer a wise decision.   Pt is looking closely @ her Dtr's beh & her Dtr's [name] Shawn's dec-mkg about the designated God Parents. When her Gson was w/her recently, he disclosed the drinking patterns of his Parents. This information has disturbed Pt due to her Gson Hayden's distress. The drinking habits of people in her social circle, including her Family,  have been outrageous in the past. It is bothering her more lately w/her Gson's recent revelations.   Pt is worried for her Dtr & her [name]. She feels her Dtr is a functioning Alcoholic & she is @ a loss re: how to speak w/her Dtr about ETOH consumption.   Interventions: Interpersonal  Diagnosis:Anxiety  Marital stress  Anxiety about health  Plan: Rielyn & [name] [name] are doing better. They are communicating more in the mornings-which is a big improvement. She is less anxious about her health issues since the marriage environment has been better. She will try to be curious & supportive towards her Dtr's drinking habits. She will discuss her daily drinking w/her [name] so they can confront this situation smartly.  Target Date: 05/21/2024  Progress: 6  Frequency: Once every 2-3 wks  Modality: [name], LMFT

## 2024-04-13 ENCOUNTER — Telehealth: Payer: Self-pay

## 2024-04-13 DIAGNOSIS — E7800 Pure hypercholesterolemia, unspecified: Secondary | ICD-10-CM

## 2024-04-13 NOTE — Telephone Encounter (Signed)
 Orders placed.

## 2024-04-13 NOTE — Telephone Encounter (Signed)
 Hep function panel and lipid panel, please order. Thx.

## 2024-04-13 NOTE — Telephone Encounter (Signed)
 Copied from CRM #[id number]. Topic: Clinical - Request for Lab/Test Order >> Apr 13, 2024 12:05 PM Turkey A wrote: Reason for CRM: Patient wants Dr.Wendling to send orders for her labs on 04/24/24

## 2024-04-21 NOTE — Telephone Encounter (Signed)
 Pt called to follow up about form that was suppose to be sent over to DR.Broswki  bout Pt is out of network and need wavier   Fax number is  [phone]

## 2024-04-23 ENCOUNTER — Other Ambulatory Visit: Payer: Self-pay | Admitting: Family Medicine

## 2024-04-23 DIAGNOSIS — J4520 Mild intermittent asthma, uncomplicated: Secondary | ICD-10-CM

## 2024-04-24 ENCOUNTER — Other Ambulatory Visit (INDEPENDENT_AMBULATORY_CARE_PROVIDER_SITE_OTHER)

## 2024-04-24 ENCOUNTER — Ambulatory Visit: Payer: Self-pay | Admitting: Family Medicine

## 2024-04-24 DIAGNOSIS — E7800 Pure hypercholesterolemia, unspecified: Secondary | ICD-10-CM

## 2024-04-24 LAB — HEPATIC FUNCTION PANEL
ALT: 14 U/L (ref 0–35)
AST: 20 U/L (ref 0–37)
Albumin: 4.4 g/dL (ref 3.5–5.2)
Alkaline Phosphatase: 68 U/L (ref 39–117)
Bilirubin, Direct: 0.2 mg/dL (ref 0.0–0.3)
Total Bilirubin: 1 mg/dL (ref 0.2–1.2)
Total Protein: 6.9 g/dL (ref 6.0–8.3)

## 2024-04-24 LAB — LIPID PANEL
Cholesterol: 144 mg/dL (ref 0–200)
HDL: 61.3 mg/dL (ref >39.00)
LDL Cholesterol: 65 mg/dL (ref 0–99)
NonHDL: 82.29
Total CHOL/HDL Ratio: 2
Triglycerides: 85 mg/dL (ref 0.0–149.0)
VLDL: 17 mg/dL (ref 0.0–40.0)

## 2024-04-29 ENCOUNTER — Other Ambulatory Visit: Payer: Self-pay | Admitting: Family Medicine

## 2024-05-01 ENCOUNTER — Ambulatory Visit: Admitting: Behavioral Health

## 2024-05-01 DIAGNOSIS — F419 Anxiety disorder, unspecified: Secondary | ICD-10-CM | POA: Diagnosis not present

## 2024-05-01 NOTE — Progress Notes (Addendum)
 [facility] Counselor/Therapist Progress Note  Patient ID: Tonya [name], MRN: [id number],    Date: 05/01/2024  Time Spent: 50 min Caregility video; Pt is home in private & Provider working remotely from [employer]. Pt is aware of the risks/limitations of telehealth & consents to Tx today. Time In: 9:00am Time Out: 9:50am   Treatment Type: Individual Therapy  Reported Symptoms: Elevated anx/dep & familial stressors  Mental Status Exam: Appearance:  Casual     Behavior: Appropriate and Sharing  Motor: Normal  Speech/Language:  Clear and Coherent  Affect: Appropriate  Mood: anxious  Thought process: normal  Thought content:   WNL  Sensory/Perceptual disturbances:   WNL  Orientation: oriented to person, place, time/date, and situation  Attention: Good  Concentration: Good  Memory: WNL  Fund of knowledge:  Good  Insight:   Good  Judgment:  Good  Impulse Control: Good   Risk Assessment: Danger to Self:  No Self-injurious Behavior: No Danger to Others: No Duty to Warn:no Physical Aggression / Violence:No  Access to Firearms a concern: No  Gang Involvement:No   Subjective: Pt is concerned for her Dtr & the overwhelm she feels w/her 2 young children. Her [name] took a last min trip to the [place] to [place] & this forced all the work on her Dtr. Pt's [name] has his personal take on this Dtr from Pt's first marriage. He gets upset over the Dtr's beh.   She is concerned for her Justine Children who are both in private school. Pt is concerned for the treatment of her Grand Children in Sch. Pt does not feel her Dtr will speak up for herself w/in her family setting w/[name] Shawn.   Pt is upset over her Dtr's drinking habits, which still stand out for Pt due to her Dtr's demands for help w/transportation for her children. She has noticed many inappropriate behaviors that her SIL exhibits when she & [name] [name] are around, helping or visiting.  Pt is looking towards the Holidays  w/some trepidation due to the family dynamics. Pt & [name] will do what she can to help her Dtr.   Interventions: Family Systems  Diagnosis:Anxiety  Plan: Parisa will try to tolerate what she can to participate w/the larger Family. They hold Christmas Eve dinner @ her home this year. Her Dtr spends the actual day of Christmas w/her [name]'s Family. They coordinate the Holidays btwn Families. Rondalyn tries to teach her [employer] good habits around the table, especially around [place] w/food.   There is still minimal intimacy in the marital relationship. Clatie will take the initiative when her [name] kisses her goodnight, to hold the kiss longer & gently caress the nape of his neck. We will discuss this @ our next ck-in in Dec.  Target Date: 05/29/2024  Progress: 5  Frequency: Once every 2-3 wks  Modality: [name], LMFT

## 2024-05-29 ENCOUNTER — Ambulatory Visit: Admitting: Behavioral Health

## 2024-05-29 DIAGNOSIS — Z630 Problems in relationship with spouse or partner: Secondary | ICD-10-CM

## 2024-05-29 DIAGNOSIS — F419 Anxiety disorder, unspecified: Secondary | ICD-10-CM

## 2024-05-29 DIAGNOSIS — R4589 Other symptoms and signs involving emotional state: Secondary | ICD-10-CM

## 2024-05-29 NOTE — Progress Notes (Signed)
 [facility] Counselor/Therapist Progress Note  Patient ID: Tonya [name], MRN: [id number],    Date: 05/29/2024  Time Spent: 45 min Caregility video; Pt is home in private & Provider working remotely from [employer]. Pt is aware of the risks/limitations of telehealth & consents to Tx today. Time In: 9:00am Time Out: 9:45am   Treatment Type: Individual Therapy  Reported Symptoms: Pt has been reluctantly approaching the Holiday Season w/her Family & her [name]  Mental Status Exam: Appearance:  Casual and Neat     Behavior: Appropriate and Sharing  Motor: Normal  Speech/Language:  Clear and Coherent  Affect: Appropriate  Mood: anxious  Thought process: normal and impacted by Dx of MCI; forgetfulness  Thought content:   Rumination  Sensory/Perceptual disturbances:   WNL  Orientation: oriented to person, place, time/date, and situation  Attention: Good  Concentration: Good  Memory: Pt c/o memory concerns, retrieval of names  Fund of knowledge:  Good  Insight:   Good  Judgment:  Good  Impulse Control: Good   Risk Assessment: Danger to Self:  No Self-injurious Behavior: No Danger to Others: No Duty to Warn:no Physical Aggression / Violence:No  Access to Firearms a concern: No  Gang Involvement:No   Subjective: Pt reports the past few wks have been challenging due to SIL's travels, seeing her Nicanor, & prepping for the [employer]. She is shopping for her Grand Children & trying to ignore her [name]'s complaining. She wants them to enjoy their Holiday Season.   Pt is frustrated w/[name]'s attitude during this time of year. She puts forward much love & attn to her Family.  Interventions: Family Systems  Diagnosis:Anxiety  Marital stress  Anxiety about health  Plan: Ynez will try to connect w/her Justine Blades more this year to connect to him. She tries to keep things light & fun so the experience of coming to Grandmother's home is good! Xana will follow the  traditions of her Dtr's Family on Christmas Day. Ula will plant a seed for Cpl Th by telling [name] Clinician hopes to meet him.  Target Date: 06/20/2024  Progress: 6  Frequency: Once every 2-3 wks  Modality: [name], LMFT

## 2024-07-03 ENCOUNTER — Ambulatory Visit (INDEPENDENT_AMBULATORY_CARE_PROVIDER_SITE_OTHER): Admitting: Behavioral Health

## 2024-07-03 DIAGNOSIS — F419 Anxiety disorder, unspecified: Secondary | ICD-10-CM | POA: Diagnosis not present

## 2024-07-03 DIAGNOSIS — R4589 Other symptoms and signs involving emotional state: Secondary | ICD-10-CM

## 2024-07-03 DIAGNOSIS — Z630 Problems in relationship with spouse or partner: Secondary | ICD-10-CM | POA: Diagnosis not present

## 2024-07-03 NOTE — Progress Notes (Addendum)
"    [facility] Counselor/Therapist Progress Note  Patient ID: Tonya [name], MRN: [id number],    Date: 07/03/2024  Time Spent: 50 min Caregility video; Pt is home in private & Provider working remotely from [employer]. Pt is aware of the risks/limitations of telehealth & consents to Tx today. Time In: 9:00am Time Out: 9:50am   Treatment Type: Individual Therapy  Reported Symptoms: Elevated anx/dep & familial stress due to estrangements w/[name]'s 2 Adult Children  Mental Status Exam: Appearance:  Casual and Neat     Behavior: Appropriate and Sharing  Motor: Normal  Speech/Language:  Clear and Coherent  Affect: Appropriate  Mood: anxious  Thought process: normal  Thought content:   WNL  Sensory/Perceptual disturbances:   WNL  Orientation: oriented to person, place, time/date, and situation  Attention: Good  Concentration: Good  Memory: Dx of MCI  Fund of knowledge:  Good  Insight:   Good  Judgment:  Good  Impulse Control: Good   Risk Assessment: Danger to Self:  No Self-injurious Behavior: No Danger to Others: No Duty to Warn:no Physical Aggression / Violence:No  Access to Firearms a concern: No  Gang Involvement:No   Subjective: Pt generally reports a good Holiday Season. Her & [name] are not fighting as much, but their communication is challenging at best. [name] has cognitive changes that are obvious to her. She is also concerned for her own situation cognitively & memory issues. She has been talking more about mortality & death.   They have been on a different sleep-wake schedule for years that also concerns Pt  for past beh of [name] w/porn & his communication w/other women. It feels like [name] is being secretive in the evening about his beh. They have exp'd different sleep routines for a year. They sleep separate & have not been intimate for over a year.   Pt has a great deal of empathy for her [name]'s situation w/in his FOO.   Interventions:  Psycho-education/Bibliotherapy, Insight-Oriented, and Family Systems  Diagnosis:Anxiety  Marital stress  Anxiety about health  Plan: Tonya [name] she is recognizing changes in her cognitive functioning. She thinks things are in general going well, but her [name]'s Family, esp'ly his Bros, are causing upset w/the marital relationship. Pt has noticed the interactions btwn [name]'s Bros & himself are neg & impact the marriage negatively. She is trying to accept the Family dynamics her [name]'s younger Terresa presents in his own life. It impacts her [name] & her own relationship negatively. Tonya [name] will work on her acceptance to further her own potential in the Family. She will keep in mind the psychoedu offered today to live in her truth & do what is right since she cannot control others.   Target Date: 08/06/2024  Progress: 6  Frequency: Once every 2-3 wks  Modality: Tonya Tonya LITTIE Hollace, LMFT    "

## 2024-07-05 NOTE — Addendum Note (Signed)
 Addended by: Jisel Fleet L on: 07/05/2024 06:39 AM   Modules accepted: Level of Service

## 2024-07-06 ENCOUNTER — Encounter (HOSPITAL_BASED_OUTPATIENT_CLINIC_OR_DEPARTMENT_OTHER): Payer: Self-pay | Admitting: Emergency Medicine

## 2024-07-06 ENCOUNTER — Other Ambulatory Visit: Payer: Self-pay

## 2024-07-06 ENCOUNTER — Emergency Department (HOSPITAL_BASED_OUTPATIENT_CLINIC_OR_DEPARTMENT_OTHER)

## 2024-07-06 ENCOUNTER — Emergency Department (HOSPITAL_BASED_OUTPATIENT_CLINIC_OR_DEPARTMENT_OTHER)
Admission: EM | Admit: 2024-07-06 | Discharge: 2024-07-06 | Disposition: A | Discharge status: Home / Self Care | Attending: Emergency Medicine | Admitting: Emergency Medicine

## 2024-07-06 DIAGNOSIS — M21611 Bunion of right foot: Secondary | ICD-10-CM | POA: Insufficient documentation

## 2024-07-06 DIAGNOSIS — M79674 Pain in right toe(s): Secondary | ICD-10-CM | POA: Insufficient documentation

## 2024-07-06 DIAGNOSIS — Z7982 Long term (current) use of aspirin: Secondary | ICD-10-CM | POA: Diagnosis not present

## 2024-07-06 DIAGNOSIS — M21619 Bunion of unspecified foot: Secondary | ICD-10-CM

## 2024-07-06 MED ORDER — LIDOCAINE 5 % EX PTCH
1.0000 | MEDICATED_PATCH | CUTANEOUS | Status: DC
  Administered 2024-07-06: 1 via TRANSDERMAL
  Filled 2024-07-06: qty 1

## 2024-07-06 MED ORDER — LIDOCAINE 5 % EX PTCH: 1.0000 | MEDICATED_PATCH | CUTANEOUS | 0 refills | Status: DC

## 2024-07-06 MED ORDER — DICLOFENAC SODIUM 1 % EX GEL: 4.0000 g | Freq: Four times a day (QID) | CUTANEOUS | 0 refills | Status: AC

## 2024-07-06 MED ORDER — KETOROLAC TROMETHAMINE 60 MG/2ML IM SOLN
30.0000 mg | Freq: Once | INTRAMUSCULAR | Status: AC
  Administered 2024-07-06: 30 mg via INTRAMUSCULAR
  Filled 2024-07-06: qty 2

## 2024-07-06 NOTE — ED Triage Notes (Signed)
 Patient coming to ED for evaluation of shooting pain in R 1st toe.  States it started suddenly.  Tried taking Advil , heat, and cold therapy without relief.  No reports of injury

## 2024-07-07 ENCOUNTER — Ambulatory Visit: Payer: Self-pay | Admitting: *Deleted

## 2024-07-07 NOTE — Telephone Encounter (Signed)
" °  FYI Only or Action Required?: Action required by provider: request for appointment.  Patient was last seen in primary care on 03/13/2024 by Frann Mabel Mt, DO.  Called Nurse Triage reporting Pain.  Symptoms began yesterday.  Interventions attempted: Prescription medications: lidocaine  patch, Voltaren  gel.  Symptoms are: stable.  Triage Disposition: See PCP When Office is Open (Within 3 Days)  Patient/caregiver understands and will follow disposition?: Yes  Copied from CRM [id number]. Topic: Clinical - Medical Advice >> Jul 07, 2024  8:34 AM Tonya [name] wrote: Reason for CRM: Patient wanting to know is there anything she can take for her nerve pain, patient has an ER follow up appointment scheduled with Dr. Frann on 1/19. Patient states hospital gave her a pain shot but wants to know what she should do for pain once it wears off.  Tonya [name] [phone] Reason for Disposition  Pain in the big toe joint  Answer Assessment - Initial Assessment Questions Patient is concerned about pain she experienced in her foot that was so severe she went to ED last night. Patient does have f/u appointment for Monday- but she is so scared the pain will come back- she is requesting medication to have on hand this weekend- or an appointment with PCP this afternoon. She does not want to do anything until she talks to her PCP. She was given pain patch and Voltaren  gel- but she is not sure it will control her pain if it comes back. Patient does not want to go back to the ED over the weekend for pain.   1. ONSET: When did the pain start?      2-3 days ago- patient was on feet yesterday walking in house and at 8:15 last night- pain shoot into foot- shocking pain every ten seconds, She was seen at ED- xray was normal - patient was given lidocaine  patch and injection of Toradol . Rx for patch/cream 2. LOCATION: Where is the pain located?      Right- ball of foot to big toe 3. PAIN: How bad is the  pain?    (Scale 1-10; or mild, moderate, severe)     No pain 4. WORK OR EXERCISE: Has there been any recent work or exercise that involved this part of the body?      Patient was on feet for 10 hours yesterday 5. CAUSE: What do you think is causing the foot pain?     Nerve pain 6. OTHER SYMPTOMS: Do you have any other symptoms? (e.g., leg pain, rash, fever, numbness)     Nerve pain in back, vagina- periodic sore spots  Protocols used: Foot Pain-A-AH  "

## 2024-07-07 NOTE — Telephone Encounter (Signed)
 Called pt amd she has appt for her pain. Advised to try the cream OTC for the pain, until she see him on Monday.

## 2024-07-07 NOTE — ED Provider Notes (Signed)
 " [facility] [place] Provider Note   CSN: [id number] Arrival date & time: 07/06/24  2142     Patient presents with: Toe Pain   Tonya [name] is a 74 y.o. female.   The history is provided by the patient and the spouse.  Toe Pain This is a new problem. The current episode started 6 to 12 hours ago. The problem occurs constantly. The problem has not changed since onset.Pertinent negatives include no chest pain, no abdominal pain, no headaches and no shortness of breath. Nothing aggravates the symptoms. Nothing relieves the symptoms. She has tried a warm compress for the symptoms. The treatment provided no relief.  Patient presents with R great toe pain after wearing boots for several hours during the day that she is not accustomed to wearing.       Prior to Admission medications  Medication Sig Start Date End Date Taking? Authorizing Provider  diclofenac  Sodium (VOLTAREN ) 1 % GEL Apply 4 g topically 4 (four) times daily. 07/06/24  Yes Ahmir Bracken, MD  lidocaine  (LIDODERM ) 5 % Place 1 patch onto the skin daily. Remove & Discard patch within 12 hours or as directed by MD 07/06/24  Yes Donnis Pecha, MD  aspirin  EC (ASPIRIN  LOW DOSE) 81 MG tablet TAKE 1 TABLET (81 MG TOTAL) BY MOUTH DAILY. SWALLOW WHOLE. 09/14/22   Tolia, Sunit, DO  atenolol  (TENORMIN ) 50 MG tablet Take 0.5 tablets (25 mg total) by mouth daily. 03/27/24   Tolia, Sunit, DO  calcium -vitamin D (OSCAL WITH D) 250-125 MG-UNIT tablet Take 1 tablet by mouth daily.    [provider]  ezetimibe  (ZETIA ) 10 MG tablet Take 1 tablet (10 mg total) by mouth daily. 02/23/24   Tolia, Sunit, DO  losartan  (COZAAR ) 50 MG tablet TAKE 1 TABLET BY MOUTH EVERY DAY 01/26/24   Frann Mabel Mt, DO  lovastatin  (MEVACOR ) 10 MG tablet TAKE 1 TABLET BY MOUTH EVERYDAY AT BEDTIME 03/27/24   Wendling, Mabel Mt, DO  montelukast  (SINGULAIR ) 10 MG tablet TAKE 1 TABLET BY MOUTH EVERYDAY AT BEDTIME 04/24/24    Wendling, Mabel Mt, DO  Multiple Vitamins-Minerals (MULTIVITAMIN ADULTS 50+) TABS Take by mouth.    [provider]  pantoprazole  (PROTONIX ) 20 MG tablet TAKE 1 TABLET BY MOUTH EVERY DAY 05/01/24   Wendling, Mabel Mt, DO  valACYclovir  (VALTREX ) 500 MG tablet Take 1 tablet (500 mg total) by mouth daily. Can increase to twice a day for 5 days in the event of a recurrence 03/22/24   Barbra Lang PARAS, DO    Allergies: Crestor  [rosuvastatin ] and Lipitor [atorvastatin ]    Review of Systems  Respiratory:  Negative for shortness of breath.   Cardiovascular:  Negative for chest pain and leg swelling.  Gastrointestinal:  Negative for abdominal pain.  Musculoskeletal:  Positive for arthralgias. Negative for gait problem and joint swelling.  Neurological:  Negative for headaches.  All other systems reviewed and are negative.   Updated Vital Signs BP (!) 122/97 (BP Location: Right Arm)   Pulse 84   Temp (!) 97.5 F (36.4 C) (Oral)   Resp 17   Ht 5' 7 (1.702 m)   Wt 88.5 kg   SpO2 98%   BMI 30.54 kg/m   Physical Exam Vitals and nursing note reviewed. Exam conducted with a chaperone present.  Constitutional:      General: She is not in acute distress.    Appearance: She is well-developed.  HENT:     Head: Normocephalic  and atraumatic.  Eyes:     Pupils: Pupils are equal, round, and reactive to light.  Cardiovascular:     Rate and Rhythm: Normal rate and regular rhythm.     Pulses: Normal pulses.     Heart sounds: Normal heart sounds.  Pulmonary:     Effort: Pulmonary effort is normal. No respiratory distress.     Breath sounds: Normal breath sounds.  Abdominal:     General: Bowel sounds are normal. There is no distension.     Palpations: Abdomen is soft.     Tenderness: There is no abdominal tenderness. There is no guarding or rebound.  Musculoskeletal:        General: Normal range of motion.     Cervical back: Neck supple.     Right ankle: Normal.     Right  Achilles Tendon: Normal.     Right foot: Normal range of motion and normal capillary refill. Bunion present. No swelling, foot drop, tenderness, bony tenderness or crepitus. Normal pulse.  Skin:    General: Skin is warm and dry.     Capillary Refill: Capillary refill takes less than 2 seconds.     Findings: No erythema or rash.  Neurological:     General: No focal deficit present.     Deep Tendon Reflexes: Reflexes normal.  Psychiatric:        Mood and Affect: Mood normal.     (all labs ordered are listed, but only abnormal results are displayed) Labs Reviewed - No data to display  EKG: None  Radiology: DG Toe Great Right Result Date: 07/06/2024 EXAM: _VIEWS_ VIEW(S) XRAY OF THE RIGHT TOES 07/06/2024 10:41:03 PM COMPARISON: None available. CLINICAL HISTORY: sharp pain right 1st toe. FINDINGS: BONES AND JOINTS: Mild to moderate hallux valgus. Narrowing and spurring of the great toe interphalangeal joint and first MTP joint is seen without evidence of erosive arthropathy or fractures. The bone mineralization is normal. SOFT TISSUES: No abnormal calcifications are noted in the soft tissues. IMPRESSION: 1. No acute fracture or erosive arthropathy identified. 2. Mild to moderate hallux valgus with degenerative change . Electronically signed by: Francis Quam MD 07/06/2024 10:53 PM EST RP Workstation: [id number]     Procedures   Medications Ordered in the ED  lidocaine  (LIDODERM ) 5 % 1 patch (1 patch Transdermal Patch Applied 07/06/24 2307)  ketorolac  (TORADOL ) injection 30 mg (30 mg Intramuscular Given 07/06/24 2331)                                    Medical Decision Making Patient with foot pain after wearing boots for several hours and does not usually wear boots.  Has had bunion on left foot fixed in past   Amount and/or Complexity of Data Reviewed Independent Historian: spouse    Details: See above  External Data Reviewed: notes.    Details: Previous notes reviewed   Radiology: ordered and independent interpretation performed.    Details: No fractures   Risk Prescription drug management. Risk Details: Well appearing.  Foot is neurovascularly intact.  Strong pulses, warm and pink.  Suspect the boots were tight fitting and aggravated bunion that is present.  Will refer to podiatry for ongoing care.  Stable for discharge.       Final diagnoses:  Pain of toe of right foot  Bunion   No signs of systemic illness or infection. The patient is nontoxic-appearing on exam and vital  signs are within normal limits.  I have reviewed the triage vital signs and the nursing notes. Pertinent labs & imaging results that were available during my care of the patient were reviewed by me and considered in my medical decision making (see chart for details). After history, exam, and medical workup I feel the patient has been appropriately medically screened and is safe for discharge home. Pertinent diagnoses were discussed with the patient. Patient was given return precautions.  ED Discharge Orders          Ordered    lidocaine  (LIDODERM ) 5 %  Every 24 hours        07/06/24 2318    diclofenac  Sodium (VOLTAREN ) 1 % GEL  4 times daily        07/06/24 2318               Divine Hansley, MD 07/07/24 0041  "

## 2024-07-10 ENCOUNTER — Ambulatory Visit (INDEPENDENT_AMBULATORY_CARE_PROVIDER_SITE_OTHER): Admitting: Family Medicine

## 2024-07-10 ENCOUNTER — Encounter: Payer: Self-pay | Admitting: Family Medicine

## 2024-07-10 VITALS — BP 124/80 | HR 68 | Temp 98.0°F | Resp 16 | Ht 67.0 in | Wt 194.0 lb

## 2024-07-10 DIAGNOSIS — M79674 Pain in right toe(s): Secondary | ICD-10-CM | POA: Diagnosis not present

## 2024-07-10 NOTE — Patient Instructions (Addendum)
 Ice/cold pack over area for 10-15 min twice daily.  OK to take Tylenol  1000 mg (2 extra strength tabs) or 975 mg (3 regular strength tabs) every 6 hours as needed.  OK to continue Voltaren  gel as needed.   Let us  know if you need anything.

## 2024-07-10 NOTE — Progress Notes (Signed)
 Chief Complaint  Patient presents with   Follow-up    ER Follow Up Foot Pain    Subjective: Patient is a 74 y.o. female here for toe pain. Here w spouse who helps w the hx.   Went to ER on 07/06/24 for R great toe pain. Started the same day. No inj or change in activity. She did wear high-heeled boots she was not accustomed to wearing. She was walking a lot in them also. Pain is better.  Still painful on the inner side of her toe.  No history of gout.  No redness, fevers, bruising, swelling.  She has been using Voltaren  gel for relief which has helped.  No neurologic signs or symptoms.  She has a known bunion but historically has not caused her issues.  She has a callus on the inner side of her bunion and also inner side of her right great toe.  The calluses do get trim down and pedicures but still cause pain.  She wears good supportive shoes and has had custom orthotics in the past, although they were more for her bunion then callus.  Past Medical History:  Diagnosis Date   Allergy June 2012   Seasonal   Asthma 2015   Cancer [facility]) 2010   Left Breast   Coronary atherosclerosis due to calcified coronary lesion    GERD (gastroesophageal reflux disease)    Many years ago   History of breast cancer    Hyperlipidemia    Hypertension    Osteopenia     Objective: BP 124/80 (BP Location: Left Arm, Patient Position: Sitting)   Pulse 68   Temp 98 F (36.7 C) (Oral)   Resp 16   Ht 5' 7 (1.702 m)   Wt 194 lb (88 kg)   SpO2 95%   BMI 30.38 kg/m  General: Awake, appears stated age Heart: Brisk capillary refill of the toe Skin: Callus formation over the medial portion of the distal phalanx on the first digit on the right foot with mild TTP; no erythema, excessive warmth edema, fluctuance, or drainage MSK: No deformity; TTP over the medial proximal phalanx of the right great toe Lungs: No accessory muscle use Psych: Age appropriate judgment and insight, normal affect and mood  Assessment  and Plan: Pain of toe of right foot  Bunion noted, reviewed x-ray with patient.  Continue Voltaren  gel, ice, heat, Tylenol  as needed.  Should steadily improved.  Regarding the callus, supportive shoes are recommended.  She has had quasi custom inserts.  Could consider sports med evaluation for repeat custom orthotics for the callus.  She gets routine pedicures which shaves it down.  Her gait/foot contour is likely contributing to this.  She does not wish to have anything done from a surgical standpoint.  Decided to hold off on a referral today.  She will let me know if she changes her mind on the patient portal. The patient voiced understanding and agreement to the plan.  I spent 30 minutes with the patient and her spouse discussing the above plan in addition to reviewing her chart on the same day of the visit.  Mabel Mt [place], DO 07/10/24  12:10 PM

## 2024-07-14 ENCOUNTER — Other Ambulatory Visit: Payer: Self-pay

## 2024-07-14 ENCOUNTER — Encounter: Payer: Self-pay | Admitting: Family Medicine

## 2024-07-14 DIAGNOSIS — R7309 Other abnormal glucose: Secondary | ICD-10-CM

## 2024-07-14 DIAGNOSIS — M109 Gout, unspecified: Secondary | ICD-10-CM

## 2024-07-14 NOTE — Addendum Note (Signed)
 Addended by: Dachelle Molzahn M on: 07/14/2024 04:09 PM   Modules accepted: Orders

## 2024-07-17 ENCOUNTER — Ambulatory Visit: Admitting: Behavioral Health

## 2024-07-17 DIAGNOSIS — Z630 Problems in relationship with spouse or partner: Secondary | ICD-10-CM | POA: Diagnosis not present

## 2024-07-17 DIAGNOSIS — R4589 Other symptoms and signs involving emotional state: Secondary | ICD-10-CM

## 2024-07-17 DIAGNOSIS — F419 Anxiety disorder, unspecified: Secondary | ICD-10-CM | POA: Diagnosis not present

## 2024-07-17 NOTE — Progress Notes (Signed)
"    [facility] Counselor/Therapist Progress Note  Patient ID: Tonya [name], MRN: [id number],    Date: 07/17/2024  Time Spent: 45 min Caregility video; Pt is @ home in private & Provider working remotely from [employer]. Pt is aware of the risks/limitations of telehealth & consents to Tx today. Time In: 10:00am Time Out: 10:45am   Treatment Type: Individual Therapy  Reported Symptoms: Elevated anx/dep & stress due to Family dynamics  Mental Status Exam: Appearance:  Casual and Neat     Behavior: Appropriate and Sharing  Motor: Normal  Speech/Language:  Clear and Coherent  Affect: Appropriate  Mood: normal  Thought process: normal  Thought content:   WNL  Sensory/Perceptual disturbances:   WNL  Orientation: oriented to person, place, time/date, and situation  Attention: Good  Concentration: Good  Memory: WNL  Fund of knowledge:  Good  Insight:   Good  Judgment:  Good  Impulse Control: Good   Risk Assessment: Danger to Self:  No Self-injurious Behavior: No Danger to Others: No Duty to Warn:no Physical Aggression / Violence:No  Access to Firearms a concern: No  Gang Involvement:No   Subjective: Pt describes the difficulty w/her home renovations last week. She has exp'd a huge pain in her R foot & great toe. It caused her to scream. Her pain lasted from 8:15 to 9:45pm. Her & [name] were watching TV & he seemed unimpacted by this pain. [name] finally took her to the ED, finally after several hours. It took them 30 min to get to the ED. Hosp X-Rays were neg & there was no Physician to assist w/her pain. She ended up w/an injection that takes 3 hrs to work & sent them home w/scripts for a patch & other meds. She & [name] went home & she was in extreme pain until 4:30am that morning. She finally got the scripts the next day.   She is concerned for the Dx of gout & it has been challenging to manage this suspicion.   Pt is giving her [name] constructive criticism that can  promote the relationship in stronger ways. He has admitted he has tendencies towards narcicism.   Interventions: Family Systems  Diagnosis:Anxiety  Marital stress  Anxiety about health  Plan: Tonya [name] will consider that it might be difficult for her [name] to be empathic towards her @ times when she is having difficulty w/pain.   Target Date: 08/06/2024  Progress: 6  Frequency: Once every 2-3 wks  Modality: Tonya Richerd LITTIE Hollace, LMFT    "

## 2024-07-18 ENCOUNTER — Other Ambulatory Visit (INDEPENDENT_AMBULATORY_CARE_PROVIDER_SITE_OTHER)

## 2024-07-18 DIAGNOSIS — M109 Gout, unspecified: Secondary | ICD-10-CM

## 2024-07-18 DIAGNOSIS — R7309 Other abnormal glucose: Secondary | ICD-10-CM

## 2024-07-18 LAB — URIC ACID: Uric Acid, Serum: 6.6 mg/dL (ref 2.4–7.0)

## 2024-07-19 ENCOUNTER — Ambulatory Visit: Payer: Self-pay | Admitting: Family Medicine

## 2024-07-19 LAB — BASIC METABOLIC PANEL WITH GFR
BUN: 17 mg/dL (ref 7–25)
CO2: 27 mmol/L (ref 20–32)
Calcium: 9.9 mg/dL (ref 8.6–10.4)
Chloride: 103 mmol/L (ref 98–110)
Creat: 0.83 mg/dL (ref 0.60–1.00)
Glucose, Bld: 83 mg/dL (ref 65–99)
Potassium: 4.6 mmol/L (ref 3.5–5.3)
Sodium: 140 mmol/L (ref 135–146)
eGFR: 75 mL/min/{1.73_m2}

## 2024-07-20 ENCOUNTER — Other Ambulatory Visit: Payer: Self-pay | Admitting: Family Medicine

## 2024-07-24 ENCOUNTER — Ambulatory Visit: Payer: Self-pay | Admitting: Family Medicine

## 2024-07-24 ENCOUNTER — Other Ambulatory Visit: Payer: Self-pay | Admitting: Family Medicine

## 2024-07-24 MED ORDER — COLCHICINE 0.6 MG PO TABS: ORAL_TABLET | ORAL | 1 refills | Status: AC

## 2024-07-24 NOTE — Telephone Encounter (Signed)
 FYI Only or Action Required?: Action required by provider: clinical question for provider and update on patient condition.  Patient was last seen in primary care on 07/10/2024 by Frann Mabel Mt, DO.  Called Nurse Triage reporting Foot Pain.  Symptoms began yesterday.  Interventions attempted: OTC medications: Voltaren  gel, Advil .  Symptoms are: gradually worsening.  Triage Disposition: See PCP When Office is Open (Within 3 Days) (overriding See Physician Within 24 Hours)  Patient/caregiver understands and will follow disposition?: Yes                                  1. ONSET: When did the pain start?      Yesterday  2. LOCATION: Where is the pain located?      Right big toe 3. PAIN: How bad is the pain?    (Scale 1-10; or mild, moderate, severe)     Rates pain an 8 when it's present 5. CAUSE: What do you think is causing the foot pain?     Gout flare-up  6. OTHER SYMPTOMS: Do you have any other symptoms? (e.g., leg pain, rash, fever, numbness)     Pink/red in color, unsure about fever, reports that spouse does not think she feels feverish  Denies: swelling, numbness/weakness Home medications providing minimal relief    This RN scheduled patient for vitual appointment with PCP tomorrow, given inclement weather. Patient would like to ask provider if he would be willing to prescribe medication in the meantime to help with pain. Please advise. Patient would appreciate a call back.   Reason for Disposition  [1] Redness of the skin AND [2] no fever  Protocols used: Foot Pain-A-AH  Reason for Triage: Patient calling in because she is experiencing severe pain in foot the last few days. She thinks it's gout. Patient is taking Voltaren  and Advil  and it's not working.

## 2024-07-24 NOTE — Telephone Encounter (Signed)
 Appt scheduled

## 2024-07-25 ENCOUNTER — Telehealth: Admitting: Family Medicine

## 2024-07-25 ENCOUNTER — Encounter: Payer: Self-pay | Admitting: Family Medicine

## 2024-07-25 DIAGNOSIS — M109 Gout, unspecified: Secondary | ICD-10-CM

## 2024-07-25 DIAGNOSIS — L84 Corns and callosities: Secondary | ICD-10-CM

## 2024-07-25 NOTE — Progress Notes (Signed)
 Chief Complaint  Patient presents with   Toe Pain    Toe Pain     Subjective: Patient is a 74 y.o. female here for R great toe pain. We are interacting via web portal for an electronic face-to-face visit. I verified patient's ID using 2 identifiers. Patient agreed to proceed with visit via this method. Patient is at home, I am at office. Patient and I are present for visit.   Patient has what we think is gout.  She is not on a daily prophylactic.  Most recent uric acid was 6.6.  She is interested in alternative to a daily prophylactic.  Thinking about tart cherry extract.  She took some Advil  which did seem to finally knock down her pain which is more tolerable today.  Denies any injury or change in activity.  She had a meal of swordfish and shrimp prior to her most recent flare.  She does not consume alcohol.  Past Medical History:  Diagnosis Date   Allergy June 2012   Seasonal   Asthma 2015   Cancer [facility]) 2010   Left Breast   Coronary atherosclerosis due to calcified coronary lesion    GERD (gastroesophageal reflux disease)    Many years ago   History of breast cancer    Hyperlipidemia    Hypertension    Osteopenia     Objective: No conversational dyspnea Age appropriate judgment and insight Nml affect and mood  Assessment and Plan: Gout, unspecified cause, unspecified chronicity, unspecified site  Callus of foot - Plan: Ambulatory referral to Podiatry  Chronic, unsure of currently stable.  Consider tart cherry extract 2-3 g daily.  A list of foods associated with high and low purines provided via MyChart.  Colchicine  as needed. Refer to podiatry at her request. The patient voiced understanding and agreement to the plan.  Mabel Mt [place], DO 07/25/24  2:07 PM

## 2024-07-26 ENCOUNTER — Other Ambulatory Visit: Payer: Self-pay | Admitting: Family Medicine

## 2024-07-31 ENCOUNTER — Ambulatory Visit: Admitting: Behavioral Health

## 2024-08-29 ENCOUNTER — Ambulatory Visit: Admitting: Podiatry

## 2024-11-06 ENCOUNTER — Ambulatory Visit: Admitting: Adult Health
